# Patient Record
Sex: Male | Born: 1955 | Race: White | Hispanic: No | Marital: Married | Smoking: Current every day smoker
Health system: Southern US, Community
[De-identification: ages and names within clinical notes are randomized; demographics above are authoritative.]

## PROBLEM LIST (undated history)

## (undated) DIAGNOSIS — Z720 Tobacco use: Secondary | ICD-10-CM

## (undated) DIAGNOSIS — I10 Essential (primary) hypertension: Secondary | ICD-10-CM

## (undated) DIAGNOSIS — D119 Benign neoplasm of major salivary gland, unspecified: Secondary | ICD-10-CM

## (undated) DIAGNOSIS — K649 Unspecified hemorrhoids: Secondary | ICD-10-CM

## (undated) DIAGNOSIS — G4730 Sleep apnea, unspecified: Secondary | ICD-10-CM

## (undated) DIAGNOSIS — K219 Gastro-esophageal reflux disease without esophagitis: Secondary | ICD-10-CM

## (undated) DIAGNOSIS — E669 Obesity, unspecified: Secondary | ICD-10-CM

## (undated) DIAGNOSIS — K5730 Diverticulosis of large intestine without perforation or abscess without bleeding: Secondary | ICD-10-CM

## (undated) DIAGNOSIS — Z860100 Personal history of colon polyps, unspecified: Secondary | ICD-10-CM

## (undated) DIAGNOSIS — I2510 Atherosclerotic heart disease of native coronary artery without angina pectoris: Secondary | ICD-10-CM

## (undated) DIAGNOSIS — N183 Chronic kidney disease, stage 3 unspecified: Secondary | ICD-10-CM

## (undated) DIAGNOSIS — R1310 Dysphagia, unspecified: Secondary | ICD-10-CM

## (undated) DIAGNOSIS — I9789 Other postprocedural complications and disorders of the circulatory system, not elsewhere classified: Secondary | ICD-10-CM

## (undated) DIAGNOSIS — K5790 Diverticulosis of intestine, part unspecified, without perforation or abscess without bleeding: Secondary | ICD-10-CM

## (undated) DIAGNOSIS — I4891 Unspecified atrial fibrillation: Secondary | ICD-10-CM

## (undated) DIAGNOSIS — E785 Hyperlipidemia, unspecified: Secondary | ICD-10-CM

## (undated) DIAGNOSIS — Z789 Other specified health status: Secondary | ICD-10-CM

## (undated) DIAGNOSIS — I6522 Occlusion and stenosis of left carotid artery: Secondary | ICD-10-CM

## (undated) DIAGNOSIS — E781 Pure hyperglyceridemia: Secondary | ICD-10-CM

## (undated) DIAGNOSIS — Z87442 Personal history of urinary calculi: Secondary | ICD-10-CM

## (undated) DIAGNOSIS — I6502 Occlusion and stenosis of left vertebral artery: Secondary | ICD-10-CM

## (undated) DIAGNOSIS — M4322 Fusion of spine, cervical region: Secondary | ICD-10-CM

## (undated) DIAGNOSIS — I739 Peripheral vascular disease, unspecified: Secondary | ICD-10-CM

## (undated) DIAGNOSIS — M1990 Unspecified osteoarthritis, unspecified site: Secondary | ICD-10-CM

## (undated) HISTORY — DX: Fusion of spine, cervical region: M43.22

## (undated) HISTORY — DX: Atherosclerotic heart disease of native coronary artery without angina pectoris: I25.10

## (undated) HISTORY — PX: OTHER SURGICAL HISTORY: SHX169

## (undated) HISTORY — PX: KNEE ARTHROSCOPY: SUR90

## (undated) HISTORY — DX: Essential (primary) hypertension: I10

## (undated) HISTORY — DX: Personal history of colonic polyps: Z86.010

## (undated) HISTORY — PX: COLONOSCOPY: SHX174

## (undated) HISTORY — DX: Dysphagia, unspecified: R13.10

## (undated) HISTORY — DX: Other postprocedural complications and disorders of the circulatory system, not elsewhere classified: I97.89

## (undated) HISTORY — DX: Occlusion and stenosis of left carotid artery: I65.22

## (undated) HISTORY — DX: Occlusion and stenosis of left vertebral artery: I65.02

## (undated) HISTORY — PX: ACHILLES TENDON REPAIR: SUR1153

## (undated) HISTORY — DX: Pure hyperglyceridemia: E78.1

## (undated) HISTORY — PX: CATARACT EXTRACTION: SUR2

## (undated) HISTORY — PX: BACK SURGERY: SHX140

## (undated) HISTORY — DX: Sleep apnea, unspecified: G47.30

## (undated) HISTORY — DX: Gastro-esophageal reflux disease without esophagitis: K21.9

## (undated) HISTORY — DX: Unspecified atrial fibrillation: I48.91

## (undated) HISTORY — PX: CARDIAC CATHETERIZATION: SHX172

## (undated) HISTORY — DX: Obesity, unspecified: E66.9

## (undated) HISTORY — DX: Diverticulosis of large intestine without perforation or abscess without bleeding: K57.30

## (undated) HISTORY — DX: Hyperlipidemia, unspecified: E78.5

## (undated) HISTORY — DX: Other specified health status: Z78.9

## (undated) HISTORY — DX: Unspecified hemorrhoids: K64.9

## (undated) HISTORY — DX: Tobacco use: Z72.0

## (undated) HISTORY — DX: Personal history of colon polyps, unspecified: Z86.0100

---

## 1999-07-30 ENCOUNTER — Ambulatory Visit: Admission: RE | Admit: 1999-07-30 | Discharge: 1999-07-30 | Payer: Self-pay | Admitting: Family Medicine

## 2002-05-04 ENCOUNTER — Ambulatory Visit (HOSPITAL_BASED_OUTPATIENT_CLINIC_OR_DEPARTMENT_OTHER): Admission: RE | Admit: 2002-05-04 | Discharge: 2002-05-04 | Payer: Self-pay | Admitting: Orthopedic Surgery

## 2003-03-09 ENCOUNTER — Ambulatory Visit (HOSPITAL_BASED_OUTPATIENT_CLINIC_OR_DEPARTMENT_OTHER): Admission: RE | Admit: 2003-03-09 | Discharge: 2003-03-09 | Payer: Self-pay | Admitting: Pulmonary Disease

## 2004-05-15 ENCOUNTER — Inpatient Hospital Stay (HOSPITAL_COMMUNITY): Admission: EM | Admit: 2004-05-15 | Discharge: 2004-05-16 | Payer: Self-pay | Admitting: Emergency Medicine

## 2004-05-22 ENCOUNTER — Ambulatory Visit (HOSPITAL_COMMUNITY): Admission: RE | Admit: 2004-05-22 | Discharge: 2004-05-22 | Payer: Self-pay | Admitting: Cardiology

## 2004-05-23 ENCOUNTER — Observation Stay (HOSPITAL_COMMUNITY): Admission: EM | Admit: 2004-05-23 | Discharge: 2004-05-24 | Payer: Self-pay | Admitting: Emergency Medicine

## 2004-09-06 ENCOUNTER — Ambulatory Visit: Payer: Self-pay | Admitting: Cardiology

## 2004-09-10 ENCOUNTER — Ambulatory Visit: Payer: Self-pay | Admitting: Cardiovascular Disease

## 2004-10-30 ENCOUNTER — Ambulatory Visit: Payer: Self-pay | Admitting: Cardiovascular Disease

## 2004-11-15 ENCOUNTER — Ambulatory Visit: Payer: Self-pay | Admitting: Cardiovascular Disease

## 2004-11-19 ENCOUNTER — Ambulatory Visit: Payer: Self-pay | Admitting: Cardiology

## 2004-11-19 ENCOUNTER — Inpatient Hospital Stay (HOSPITAL_BASED_OUTPATIENT_CLINIC_OR_DEPARTMENT_OTHER): Admission: RE | Admit: 2004-11-19 | Discharge: 2004-11-19 | Payer: Self-pay | Admitting: Cardiovascular Disease

## 2004-11-25 ENCOUNTER — Ambulatory Visit: Payer: Self-pay | Admitting: Family Medicine

## 2004-11-26 ENCOUNTER — Ambulatory Visit: Payer: Self-pay | Admitting: Cardiology

## 2004-11-27 ENCOUNTER — Ambulatory Visit: Payer: Self-pay | Admitting: Internal Medicine

## 2004-12-25 ENCOUNTER — Ambulatory Visit: Payer: Self-pay | Admitting: Internal Medicine

## 2005-01-14 ENCOUNTER — Ambulatory Visit: Payer: Self-pay | Admitting: Internal Medicine

## 2005-06-26 ENCOUNTER — Ambulatory Visit: Payer: Self-pay | Admitting: Internal Medicine

## 2006-01-12 ENCOUNTER — Ambulatory Visit: Payer: Self-pay | Admitting: Internal Medicine

## 2006-02-23 ENCOUNTER — Ambulatory Visit: Payer: Self-pay | Admitting: Internal Medicine

## 2006-07-01 ENCOUNTER — Ambulatory Visit: Payer: Self-pay | Admitting: Family Medicine

## 2006-07-23 ENCOUNTER — Ambulatory Visit: Payer: Self-pay | Admitting: Internal Medicine

## 2006-08-03 ENCOUNTER — Encounter (INDEPENDENT_AMBULATORY_CARE_PROVIDER_SITE_OTHER): Payer: Self-pay | Admitting: *Deleted

## 2006-08-03 ENCOUNTER — Ambulatory Visit: Payer: Self-pay | Admitting: Internal Medicine

## 2006-10-12 ENCOUNTER — Observation Stay (HOSPITAL_COMMUNITY): Admission: EM | Admit: 2006-10-12 | Discharge: 2006-10-13 | Payer: Self-pay | Admitting: Emergency Medicine

## 2006-10-12 ENCOUNTER — Ambulatory Visit: Payer: Self-pay | Admitting: Cardiology

## 2006-11-11 ENCOUNTER — Ambulatory Visit: Payer: Self-pay | Admitting: Cardiovascular Disease

## 2007-07-23 ENCOUNTER — Ambulatory Visit: Payer: Self-pay | Admitting: Cardiology

## 2007-09-14 ENCOUNTER — Ambulatory Visit: Payer: Self-pay | Admitting: Cardiovascular Disease

## 2008-02-28 ENCOUNTER — Ambulatory Visit: Payer: Self-pay | Admitting: Cardiovascular Disease

## 2008-03-01 DIAGNOSIS — K5730 Diverticulosis of large intestine without perforation or abscess without bleeding: Secondary | ICD-10-CM | POA: Insufficient documentation

## 2008-03-01 DIAGNOSIS — K219 Gastro-esophageal reflux disease without esophagitis: Secondary | ICD-10-CM

## 2008-03-01 DIAGNOSIS — K649 Unspecified hemorrhoids: Secondary | ICD-10-CM | POA: Insufficient documentation

## 2008-03-01 DIAGNOSIS — Z860100 Personal history of colon polyps, unspecified: Secondary | ICD-10-CM | POA: Insufficient documentation

## 2008-03-01 DIAGNOSIS — G4733 Obstructive sleep apnea (adult) (pediatric): Secondary | ICD-10-CM

## 2008-03-01 DIAGNOSIS — I2510 Atherosclerotic heart disease of native coronary artery without angina pectoris: Secondary | ICD-10-CM

## 2008-03-06 ENCOUNTER — Ambulatory Visit: Payer: Self-pay

## 2008-05-16 ENCOUNTER — Emergency Department (HOSPITAL_COMMUNITY): Admission: EM | Admit: 2008-05-16 | Discharge: 2008-05-16 | Payer: Self-pay | Admitting: Emergency Medicine

## 2008-05-16 ENCOUNTER — Ambulatory Visit: Payer: Self-pay | Admitting: Internal Medicine

## 2008-06-08 ENCOUNTER — Ambulatory Visit: Payer: Self-pay | Admitting: Cardiovascular Disease

## 2008-06-19 ENCOUNTER — Encounter: Payer: Self-pay | Admitting: Internal Medicine

## 2008-07-10 ENCOUNTER — Encounter: Payer: Self-pay | Admitting: Internal Medicine

## 2008-07-10 ENCOUNTER — Ambulatory Visit (HOSPITAL_COMMUNITY): Admission: RE | Admit: 2008-07-10 | Discharge: 2008-07-10 | Payer: Self-pay | Admitting: Gastroenterology

## 2008-10-12 ENCOUNTER — Ambulatory Visit: Payer: Self-pay | Admitting: Cardiovascular Disease

## 2009-06-19 DIAGNOSIS — J42 Unspecified chronic bronchitis: Secondary | ICD-10-CM

## 2009-06-19 DIAGNOSIS — I10 Essential (primary) hypertension: Secondary | ICD-10-CM

## 2009-06-19 DIAGNOSIS — R079 Chest pain, unspecified: Secondary | ICD-10-CM

## 2009-06-19 DIAGNOSIS — E669 Obesity, unspecified: Secondary | ICD-10-CM

## 2009-06-19 DIAGNOSIS — E785 Hyperlipidemia, unspecified: Secondary | ICD-10-CM | POA: Insufficient documentation

## 2009-06-19 DIAGNOSIS — R1310 Dysphagia, unspecified: Secondary | ICD-10-CM | POA: Insufficient documentation

## 2009-06-19 DIAGNOSIS — E781 Pure hyperglyceridemia: Secondary | ICD-10-CM | POA: Insufficient documentation

## 2009-06-21 ENCOUNTER — Ambulatory Visit: Payer: Self-pay | Admitting: Cardiovascular Disease

## 2009-07-10 ENCOUNTER — Ambulatory Visit: Payer: Self-pay | Admitting: Cardiovascular Disease

## 2009-07-12 ENCOUNTER — Encounter (INDEPENDENT_AMBULATORY_CARE_PROVIDER_SITE_OTHER): Payer: Self-pay | Admitting: *Deleted

## 2009-07-12 LAB — CONVERTED CEMR LAB
ALT: 30 units/L (ref 0–53)
AST: 26 units/L (ref 0–37)
Alkaline Phosphatase: 44 units/L (ref 39–117)
Direct LDL: 77.2 mg/dL
Total Bilirubin: 0.8 mg/dL (ref 0.3–1.2)
Total CHOL/HDL Ratio: 5
Triglycerides: 325 mg/dL — ABNORMAL HIGH (ref 0.0–149.0)

## 2009-08-21 ENCOUNTER — Telehealth: Payer: Self-pay | Admitting: Cardiovascular Disease

## 2009-11-01 ENCOUNTER — Encounter (INDEPENDENT_AMBULATORY_CARE_PROVIDER_SITE_OTHER): Payer: Self-pay | Admitting: *Deleted

## 2010-01-11 ENCOUNTER — Telehealth (INDEPENDENT_AMBULATORY_CARE_PROVIDER_SITE_OTHER): Payer: Self-pay | Admitting: *Deleted

## 2010-01-15 ENCOUNTER — Encounter (INDEPENDENT_AMBULATORY_CARE_PROVIDER_SITE_OTHER): Payer: Self-pay | Admitting: *Deleted

## 2010-08-13 ENCOUNTER — Encounter: Payer: Self-pay | Admitting: Cardiovascular Disease

## 2010-08-13 ENCOUNTER — Emergency Department (HOSPITAL_COMMUNITY): Admission: EM | Admit: 2010-08-13 | Discharge: 2010-08-13 | Payer: Self-pay | Admitting: Emergency Medicine

## 2010-08-13 ENCOUNTER — Telehealth: Payer: Self-pay | Admitting: Cardiovascular Disease

## 2010-08-13 ENCOUNTER — Ambulatory Visit: Payer: Self-pay | Admitting: Internal Medicine

## 2010-08-14 ENCOUNTER — Telehealth: Payer: Self-pay | Admitting: Cardiovascular Disease

## 2010-09-13 ENCOUNTER — Telehealth: Payer: Self-pay | Admitting: Cardiovascular Disease

## 2010-11-28 NOTE — Letter (Signed)
Summary: Appointment - Reminder 2  [employer], Main Office  [address]   [place], [place] [address]   Phone: [phone]  Fax: [phone]     November 01, 2009 MRN: [id number]   [name] [address] [place], [place]  [address]   Dear Mr. Galant,  Our records indicate that it is time to schedule a follow-up appointment with Dr. Eden Emms. It is very important that we reach you to schedule this appointment. We look forward to participating in your health care needs. Please contact us at the number listed above at your earliest convenience to schedule your appointment.  If you are unable to make an appointment at this time, give [place] a call so we can update our records.  Sincerely,   Migdalia Dk [facility] Scheduling Team

## 2010-11-28 NOTE — Letter (Signed)
Summary: ER Notification  [employer], Main Office  [address]   [place], [place] [address]   Phone: [phone]  Fax: [phone]    August 13, 2010 11:24 AM  [name]  The above referenced patient has been advised to report directly to the Emergency Room. Please see below for more information:  Dx: Jonni Sanger pain___________     Private Vehicle  _____X__________ or EMS:  ________________   Orders:  Yes ______ or No  ___X____   Notify upon arrival:     Trish [phone]       Or _________________   Thank you,    [facility] Staff

## 2010-11-28 NOTE — Progress Notes (Signed)
Summary: pt having shoulder pain/arm numbness   Phone Note Call from Patient   Caller: Patient [phone] Reason for Call: Talk to Nurse Summary of Call: pt having left shoulder pain starting yesterday, left arm feeling numb, denies chest pain, sob, dizziness-pls call Initial call taken by: Glynda Jaeger,  August 13, 2010 11:04 AM  Follow-up for Phone Call        Pt reports pain started yesterday in left shoulder and went around to back. Took advil with little relief. Tossed and turned all night. Pain is continuing this morning with numbness to left hand. Took 2 NTG about 30 minutes ago with slight relief. I instructed pt he needed to call 911 and have EMS transport him to [facility]. He states he is in the car at present time and someone is driving him to the hospital and that he is about 10 minutes from hospital.  Will notify ED. Follow-up by: Dossie Arbour, RN, BSN,  August 13, 2010 11:23 AM

## 2010-11-28 NOTE — Letter (Signed)
Summary: Clearance Letter  [employer], Main Office  [address]   [place], [place] [address]   Phone: [phone]  Fax: [phone]    January 15, 2010  Re:     [name] Address:   [address]     [place], [place]  [address] DOB:     Sep 07, 1956 MRN:     [id number]   Dear Dr Thurston Hole,       [name] is low risk cardiac wise for procedure. Okay to stop plavix for procedure but would like patient to continue on the aspirin. It will be fine for the procedure to be done as an outpatient and please give [place] a call if any problems arise. Please call with any questions or concerns.   Sincerely,  Deliah Goody, RN/Dr Charlton Haws

## 2010-11-28 NOTE — Progress Notes (Signed)
Summary: need assistance with cost of plavix   Phone Note Call from Patient Call back at Home Phone [phone] Call back at [phone] Message from:  Patient on August 14, 2010 10:42 AM  Refills Requested: Medication #1:  PLAVIX 75 MG TABS Take 1 tablet by mouth once a day Caller: Spouse Reason for Call: Talk to Nurse Summary of Call: pt wife need assistance with cost of plavix. pls advise.  Initial call taken by: Lorne Skeens,  August 14, 2010 10:43 AM  Follow-up for Phone Call        spoke with pt's wife. Mrs. Hashem states he/she nees assistance with the Plavix. Pt is taken Plavix 75 mg, one tablet daily. I went over the  [employer] Patient assistance Program form with wife. Form faxed to pt's wife to fax # [phone] as requested.  Follow-up by: Ollen Gross, RN, BSN,  August 14, 2010 10:58 AM

## 2010-11-28 NOTE — Progress Notes (Signed)
   Recieved request from [facility] ONE forwarded to [facility] for processing [facility]  January 11, 2010 11:25 AM    Appended Document:  Recieved Request from [facility] forwarded to [facility] for processing

## 2010-11-28 NOTE — Progress Notes (Signed)
Summary: pt requesting samples of plavix   Phone Note Call from Patient   Caller: Patient [phone] Reason for Call: Talk to Nurse Summary of Call: pt requesting samples of plavix Initial call taken by: Glynda Jaeger,  September 13, 2010 12:00 PM  Follow-up for Phone Call        we have no samples of plavix as soon as we get some i will let the pt know.Marland KitchenMarland KitchenI called pt and let im know what the situation was Follow-up by: Kem Parkinson,  September 13, 2010 3:25 PM

## 2011-01-08 LAB — POCT CARDIAC MARKERS
CKMB, poc: <1 ng/mL — ABNORMAL LOW (ref 1.0–8.0)
Troponin i, poc: <0.05 ng/mL (ref 0.00–0.09)

## 2011-01-08 LAB — DIFFERENTIAL
Basophils Absolute: 0.1 10*3/uL (ref 0.0–0.1)
Basophils Relative: 1 % (ref 0–1)
Lymphocytes Relative: 13 % (ref 12–46)
Monocytes Relative: 7 % (ref 3–12)
Neutro Abs: 7.8 10*3/uL — ABNORMAL HIGH (ref 1.7–7.7)
Neutrophils Relative %: 75 % (ref 43–77)

## 2011-01-08 LAB — POCT I-STAT, CHEM 8
Chloride: 108 mEq/L (ref 96–112)
HCT: 46 % (ref 39.0–52.0)
Potassium: 4.4 mEq/L (ref 3.5–5.1)

## 2011-01-08 LAB — URINALYSIS, ROUTINE W REFLEX MICROSCOPIC
Bilirubin Urine: NEGATIVE
Ketones, ur: NEGATIVE mg/dL
Nitrite: NEGATIVE
Protein, ur: NEGATIVE mg/dL
Urobilinogen, UA: 0.2 mg/dL (ref 0.0–1.0)

## 2011-01-08 LAB — CBC
Hemoglobin: 15 g/dL (ref 13.0–17.0)
MCHC: 34.2 g/dL (ref 30.0–36.0)
WBC: 10.3 10*3/uL (ref 4.0–10.5)

## 2011-03-11 NOTE — Discharge Summary (Signed)
[name], [name]               ACCOUNT NO.:  [id number]   MEDICAL RECORD NO.:  [id number]          PATIENT TYPE:  INP   LOCATION:  1827                         FACILITY:  [facility]   PHYSICIAN:  [name], MDDATE OF BIRTH:  [birth year]   DATE OF ADMISSION:  05/16/2008  DATE OF DISCHARGE:  05/16/2008                               DISCHARGE SUMMARY   PRIMARY CARDIOLOGIST:  Kyle Arista C. Eden Emms, MD, [facility].   PRIMARY CARE PHYSICIAN:  Kyle Schwalbe, MD.   PROCEDURES PERFORMED DURING HOSPITALIZATION.:  Cardiac catheterization  completed by Dr. Arvilla [name] on May 16, 2008, revealing  nonobstructive disease in all coronary arteries with patent stents in  the right coronary artery in OM1 with a normal LVEF at 65% and normal LV  wall motion.   FINAL DISCHARGE DIAGNOSES:  1. Chest pain in the setting of coronary artery disease.  2. Status post cardiac catheterization with patent stents.  3. Status post drug-eluting stent to the right coronary artery in 2005      and drug-eluting stent to the circumflex in 2007.  4. Hypertension.  5. Hyperlipidemia.  6. Hypertriglyceridemia.  7. History of obstructive sleep apnea.  8. Ongoing tobacco abuse.   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  The  patient's wife states that he has been more short of breath over the  last several weeks.  The pain was very similar to the pain prior to his  stent placement in 2007.  The patient took one nitroglycerin, called  [place], and was advised to come to the emergency room.  The patient was  seen and examined in the emergency room by myself and Dr. Arvilla [name] and placed on nitroglycerin, heparin, and oxygen and planned  for cardiac catheterization the same day.  The patient was pain free  after institution of nitroglycerin.  The  patient was willing to proceed  with catheterization.  Catheterization was completed by Dr. Gala [name],  with results described above.  Please see Dr. Prescott [name]  catheterization report for more details.  It was found that the  patient's stents were patent, and he was stable for discharge home.  After the usual recovery time, the patient was discharged home on  medications prior to his admission.  The patient is to follow up with  Dr. Charlton [name] in the next couple of weeks; our office will call, as  it is after hours, to make a followup appointment.   DISCHARGE LABORATORIES:  D-dimer 0.34, troponin less than 0.1, CK 215,  and MB 3.1.  Sodium 140, potassium 3.9, chloride 107, CO2 of 26, glucose  126, BUN 12, and creatinine 1.01.  Hemoglobin 13.6, hematocrit 39.9,  white blood cells 6.7, and platelets 172.  Chest x-ray dated May 16, 2008, revealing no active disease.  EKG revealing normal sinus rhythm.   VITAL SIGNS ON DISCHARGE:  Blood pressure 146/80, heart rate 68,  respirations 16, and O2  sat 98% on room air.   DISCHARGE MEDICATIONS:  1. Plavix 75 mg daily.  2. Prilosec 20 mg daily.  3. Simvastatin 80 mg daily.  4. Coated aspirin 325 mg daily.  5. Lisinopril 10 mg daily.  6. Fish oil daily.   ALLERGIES:  No known drug allergies.   FOLLOWUP PLANS AND APPOINTMENTS:  1. The patient is to follow up with Dr. Charlton [name], our office will      call, as this is after hours, to make a followup appointment.  2. The patient is to follow up with his primary care physician Dr.      Alphonsus [name] for continued medical management.  3. The patient has been advised on smoking cessation.  4. The patient has been given post cardiac-catheterization      instructions, with particular emphasis on the right groin site for      evidence of bleeding, hematoma, or signs of infection.   Time spent with the patient to include physician time of 35 minutes.      Bettey Mare. Lyman Bishop, NP      Kyle Buckles. Bensimhon, MD  Electronically Signed    KML/MEDQ  D:  05/16/2008  T:  05/17/2008  Job:  [id number]   cc:   Kyle Schwalbe, MD

## 2011-03-11 NOTE — Assessment & Plan Note (Signed)
[facility]                            CARDIOLOGY OFFICE NOTE   NAME:[name], [name]                      MRN:          [id number]  DATE:10/12/2008                            DOB:          08/01/1956    [name] returns today for followup.  He has known coronary artery disease.  He has had previous drug-eluting stent to the right coronary artery in  2005 and drug-eluting stent to the circ in 2007.  He has hypertension  and hyperlipidemia with ongoing smoking cessation.  I talked to him at  length about this.  He is just not willing to quit.  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  He is trying to find a way around this by getting a  tobacco contract with [employer].  Orvilla Fus is not having any  significant chest pain.  He had a chest x-ray in July before his heart  cath, which did not show any lung cancer or abnormalities.   He has mild chronic exertional dyspnea from his subclinical COPD.   He has been compliant with his medications.  He will probably need a  refill soon.  Otherwise, he is doing well.  He has no known allergies.   MEDICATIONS:  1. He is on an aspirin a day.  2. Plavix 75 a day.  3. Prilosec 75 a day.  4. Simvastatin 40.  5. Lisinopril 10 a day.   He uses [employer] at [employer].  He has tried Chantix in the  past, but is not quit smoking successfully.   PHYSICAL EXAMINATION:  GENERAL:  Remarkable for a irascible white male  in no distress.  He is a bit bronchitic.  VITAL SIGNS:  His weight is 216, blood pressure 120/70, pulse 82 and  regular, respiratory rate 14, afebrile.  HEENT:  Unremarkable.  NECK:  Carotids are normal without bruit.  No lymphadenopathy,  thyromegaly, or JVP elevation.  LUNGS:  Clear.  Good diaphragmatic motion.  No wheezing.  CARDIAC:  S1, S2 with normal  heart sounds.  PMI normal.  ABDOMEN:  Benign.  Bowel sounds positive.  No AAA.  No tenderness.  No  bruit.  No hepatosplenomegaly or hepatojugular reflux.  No tenderness.  EXTREMITIES:  Distal pulses are intact.  No edema.  NEUROLOGIC:  Nonfocal.  SKIN:  Warm and dry.  MUSCULOSKELETAL:  No muscular weakness.   IMPRESSION:  1. Coronary artery disease stent to the right coronary artery and circ      widely patent by cath this past July.  Continue aspirin and Plavix.  2. Chronic obstructive pulmonary disease subclinically p.r.n.      inhalers.  I talked to him at length about his smoking, but I do      not think this is ever going to work for [employer].  We will make sure      he gets a followup chest x-ray in July since we  seem to be the only      people who follows this.  3. Hypercholesteremia.  Continue simvastatin.  Lipid and liver profile      in 6 months.  4. Hypertension, currently well controlled.  Continue lisinopril and      low-sodium diet.  5. History of reflux.  Undoubtedly worsened by the patient's alcohol      intake and nicotine.  Continue Prilosec.  No indication for EGD at      this time.     [name]. Eden Emms, MD, [facility]  Electronically Signed    PCN/MedQ  DD: 10/12/2008  DT: 10/13/2008  Job #: [id number]

## 2011-03-11 NOTE — Assessment & Plan Note (Signed)
[facility]                            CARDIOLOGY OFFICE NOTE   NAME:[name], [name]                      MRN:          [id number]  DATE:02/28/2008                            DOB:          [birth year]    [name] returns today for follow-up. He has been doing well.  He has been  active.  He is not having significant chest pain.  He has exertional  dyspnea which is likely related to his smoking. There has been no cough  or sputum production.  No active wheezing.   The patient has been compliant with his meds.  He needs to be on blood  pressure pills.   He has a history coronary disease with previous stent to the circ and  right coronary artery.  His last cath in 2007 resulted in stenting of  the circ with a patent stent to the right.   His review of systems remarkable for a question of a ventral hernia,  otherwise negative.   PHYSICAL EXAMINATION:  VITAL SIGNS:  Blood pressure of 151/90, pulse 79  and regular, weight 220, respiratory rate 14.  HEENT:  Unremarkable.  Carotids normal without bruit, no  lymphadenopathy, thyromegaly or JVP elevation.  LUNGS:  Clear with good diaphragmatic motion.  No wheezing. S1, S2 with  normal heart sounds. PMI normal.  ABDOMEN:  Benign.  Bowel sounds positive. No AAA, no tenderness.  He  does have a bit of a separation in his rectus muscle below the xiphoid.  No large ventral hernia.  No hepatosplenomegaly or hepatojugular reflux.  Distal pulses are intact with no edema.  NEURO:  Nonfocal.  SKIN:  Warm and dry.  No muscular weakness.   EKG shows sinus rhythm with no acute changes.   IMPRESSION:  1. Dyspnea related to smoking. I talked to him at length regarding      quitting. Not interested in Wellbutrin or Chantix.  2. History of stents to the right coronary artery and circumflex.      Follow-up stress Myoview, particularly given continued smoking.  3. Hypertension.  Add lisinopril 10 mg a day. See what his  blood      pressure response is to exercise. Encouraged low-salt diet.  4. Hypercholesterolemia.  Continue simvastatin 40 a day, lipid and      liver profile in 6 months.  5. History of reflux. Cutdown alcohol intake.  Continue Prilosec.      Follow-up with primary care MD.     [name]. Eden Emms, MD, [facility]  Electronically Signed   PCN/MedQ  DD: 02/28/2008  DT: 02/28/2008  Job #: [id number]

## 2011-03-11 NOTE — Op Note (Signed)
NAMEJATAVIOUS, [name]               ACCOUNT NO.:  [id number]   MEDICAL RECORD NO.:  [id number]          PATIENT TYPE:  AMB   LOCATION:  ENDO                         [facility]ACILITY:  [facility]   PHYSICIAN:  Petra Kuba, M.D.    DATE OF BIRTH:  Mar 17, 1956   DATE OF PROCEDURE:  07/10/2008  DATE OF DISCHARGE:                               OPERATIVE REPORT   PROCEDURE:  Esophagogastroduodenoscopy with dilatation.   INDICATION:  Patient with longstanding upper tracheal symptoms, some  dysphagia.  Consent was signed after risks, benefits, methods, options  thoroughly discussed multiple times in the past.   MEDICINES USED:  Fentanyl 100 mcg.  Versed 8 mg.   PROCEDURE IN DETAIL:  The video endoscope was inserted by direct vision.  Proximal and mid esophagus was normal and the distal esophagus was a  small hiatal hernia, but no signs of ring strictures or significant  esophagitis.  The scope was passed in the stomach and advanced through a  normal antrum, normal pylorus, into a normal duodenal bulb and around  the cecum to a normal second portion of the duodenum.  The scope was  withdrawn back to the bulb, and a good look there ruled out  abnormalities at that location.  The scope was withdrawn back to the  stomach and retroflexed high in the cardia.  A hiatal hernia was  confirmed.  The fundus angularis was visualized throughout retro curve,  revealed normal  retroflexed visualization.  Visualization and stomach  did not reveal any additional findings.  The scope was then slowly  withdrawn back to 20 cm.  Again the esophagus was normal, except for the  small hiatal hernia.  We then readvanced to the antrum and under fluoro  guidance, the Savary wire was advanced.  The customary J-loop was  confirmed in the antrum.  The scope was removed, making sure to keep the  wire in the proper location under fluoro.  Once the scope was removed,  the Savary 16 mm dilator only was advanced into the stomach and  confirmed in the proper position under fluoro.  There was minimal  proximal resistance, but then once past his throat, there was no  resistance.  Once the dilator was confirmed in the stomach, the wire was  withdrawn back into the dilator.  Both were removed in tandem.  There  was no heme on the dilator.   The patient tolerated the procedure well.  There was no obvious  immediate complication.   ENDOSCOPIC DIAGNOSES:  1. Small hiatal hernia.  2. Otherwise normal esophagogastroduodenoscopy.  3. Therapy Savary dilatation under fluoro to 16 mm only with minimal      proximal resistance and no heme.  4. Continue the Nexium.  See how the dilation works.  5. Follow up p.r.n. or in 6 weeks.  6. Fully advance diet.  7. Hold aspirin for 1 day.           ______________________________  Petra Kuba, M.D.     MEM/MEDQ  D:  07/10/2008  T:  07/10/2008  Job:  [id number]   cc:  [name]. Eden Emms, MD, [facility]  [address]  [address]  [place]  [place] [address]   Karie Schwalbe, MD  [address] [place], [place] [address]

## 2011-03-11 NOTE — Assessment & Plan Note (Signed)
[facility]                            CARDIOLOGY OFFICE NOTE   NAME:[name]                      MRN:          [id number]  DATE:06/08/2008                            DOB:          1956-03-13    [name] returns today for followup.   He has known coronary artery disease and has previous stents to the  right coronary artery and circ.  He unfortunately, continues to smoke.  I talked to him at length about this.  I offered him various of  substitutes including Nicorette gum, Chantix and takes and Wellbutrin.  Unfortunately, [name] is a lifelong smoker and he is not very motivated  to quit.  We talked about all the different risks in regards to lung  cancer and progression of his coronary disease.  He will let me know if  he wants to be proactive about this.   He has been fairly busy since hospital discharge and has not had any  recent chest pain.  He was seen on May 16, 2008, for chest pain which  was reminiscent of his angina.  He had a heart catheterization done by  Dr. Gala Romney.   The results were reviewed.  He had patent stents with moderate coronary  artery disease in the LAD and mid circ in the 40-50% range, noted to be  rate limiting.  These films were reviewed on the chemtronic system since  I was not involved in his initial care, time to review 10 minutes.   REVIEW OF SYSTEMS:  Remarkable for some dysphasia.  He clearly describes  some difficulty swallowing both solids and some food.  I tried to  question him to make sure this was not postnasal drip or other  secretions from an ear, nose and throat problem but he specifically  indicated he has problems swallowing things.   His review of systems otherwise remarkable for no significant chest  pain, no diaphoresis, no coughing, no hemoptysis.   ALLERGIES:  He has no known allergies.   MEDICATIONS:  1. Aspirin a day.  2. Plavix 75 a day.  3. Simvastatin 40 a day.  4.  Lisinopril 10 a day.   PHYSICAL EXAMINATION:  GENERAL:  Remarkable for a bronchitic male in no  distress.  He has returned from a recent trip to [place].  VITAL SIGNS:  His weight is 214, blood pressure 140/80, pulse 73 and  regular, respiratory rate 14, afebrile.  HEENT:  Unremarkable.  NECK:  Carotids are normal without bruit.  No lymphadenopathy,  thyromegaly, or JVP elevation.  LUNGS:  Clear.  Good diaphragmatic motion.  No wheezing.  HEART:  S1 and S2 with normal heart sounds.  PMI normal.  ABDOMEN:  Benign.  Bowel sounds positive.  No AAA.  No tenderness.  No  bruit.  No hepatosplenomegaly.  No hepatojugular reflux.  EXTREMITIES:  Distal pulses are  intact.  No edema.  NEURO:  Nonfocal.  SKIN:  Warm and dry.  MUSCULOSKELETAL:  No muscular weakness.   IMPRESSION:  1. Coronary artery disease.  Recent hospitalization for chest pain,  benign cath.  Continue aspirin, Plavix and beta-blocker.  2. Smoking sensation.  I counseled the patient for actually more than      10 minutes in regards to this.  I have been talking to [facility] for      many years.  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  Again, I gave him all of his options and he will      let me know if he gets motivated.  3. History of reflux.  Continue Prilosec.  4. New-onset dysphagia.  Refer to Dr. Ewing Schlein or Dr. Matthias Hughs, may need      upper endoscopy.  5. Hypercholesterolemia in the setting of coronary artery disease.      Continue simvastatin, lipid and liver profile in 6 months.  6. Hypertension, currently well-controlled.  Continue low-salt diet      and lisinopril.   I will see [name] back in 6 months.  He will let me know if he has any  further chest pain.  He does have nitroglycerin at home and we will have  to see what his dysphasia is all about.     [name]. Eden Emms, MD, [facility]  Electronically  Signed    PCN/MedQ  DD: 06/08/2008  DT: 06/08/2008  Job #: [id number]

## 2011-03-11 NOTE — H&P (Signed)
[name], [name]               ACCOUNT NO.:  [id number]   MEDICAL RECORD NO.:  [id number]          PATIENT TYPE:  INP   LOCATION:  1827                         FACILITY:  [facility]   PHYSICIAN:  Bevelyn Buckles. Bensimhon, MDDATE OF BIRTH:  Mar 12, 1956   DATE OF ADMISSION:  05/16/2008  DATE OF DISCHARGE:  05/16/2008                              HISTORY & PHYSICAL   PRIMARY CARDIOLOGIST:  Theron Arista C. 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  The  patient's wife also states that he had been more short of breath for the  last several weeks.  The patient admits to exertional activity over the  weekend to include cutting and preparing corn; however, he did not have  any chest pain at that time.  The patient had gone to work today, was  sitting, reading the newspaper, and had severe substernal chest pain  similar to the pain he had prior to stent placement.  The patient called  the [facility] to talk with Dr. Eden Emms.  He was advised to take a  sublingual nitroglycerin and come to the emergency room.  On arrival in  the emergency room, the patient continued to have some chest pain.  I  saw him and placed him on nitroglycerin drip, heparin, and oxygen.  The  patient's pain subsided without any further reoccurrence.  The patient  denies any nausea, vomiting, diaphoresis, weakness, or dizziness.  He  has noticed some shortness of breath.  Unfortunately, the patient  continues to smoke despite multiple discussions concerning cessation.   REVIEW OF SYSTEMS:  The patient complained of chest pain, shortness of  breath, and dyspnea on exertion.  Otherwise, negative for nausea,  vomiting, diarrhea, diaphoresis, or weakness.   PAST MEDICAL HISTORY:  1. CAD.      a.      Status post drug-eluting stent to the right coronary artery       in 2005 and drug-eluting stent to the circumflex in 2007.      b.     Catheterization in December 2007 by Dr. Excell Seltzer showed widely       patent right coronary artery stent with nonobstructive lesion in       the proximal right coronary artery, 40% mid LAD, 20% proximal       circumflex, 95-40% mid circumflex with an EF of 65%.  The patient       had a stent placed reducing it from 95% and 40% to 0% per Dr.       Excell Seltzer.  2. Obstructive sleep apnea.  3. Hypertension.  4. Hypercholesterolemia.  5. Hypertriglyceridemia.  6. Ongoing tobacco abuse.  7. GERD.  8. Obesity.   SOCIAL HISTORY:  The patient lives in [place] with his wife.  He is  a Psychologist, sport and exercise.  He is a 40-pack-a-year or more smoker.  Drinks daily.  No drug use.   FAMILY HISTORY:  Pancreatic cancer in his mother, who has deceased from  this.  Father deceased from motor vehicle accident.  Siblings, he has 2  brothers with MIs in their 63s.   CURRENT MEDICATIONS AT HOME:  1. Plavix 75 mg daily.  2. Aspirin 325 mg daily.  3. Prilosec 20 mg daily.  4. Fish oil daily.  5. Simvastatin 80 mg daily.  6. Lisinopril 10 mg daily.   ALLERGIES:  No known drug allergies.   Cardiac markers, point-of-care, CK-MB less than 1.0, troponin 0.05,  myoglobin 103.  Other labs are pending.   PHYSICAL EXAMINATION:  VITAL SIGNS:  Blood pressure 142/84, pulse 70,  respirations 18, temperature 98.5, and O2 sat 98% on 2 liters.  HEENT:  Head is normocephalic and atraumatic.  Eyes, PERRLA.  Mucous  membranes of the mouth, pink and moist.  Tongue is midline.  Neck is  supple.  There is no JVD.  No carotid bruits appreciated.  CARDIOVASCULAR:  Regular rate and rhythm without murmurs, rubs, or  gallops at present.  Pulses are equal bilaterally.  Femoral pulses are  1+ bilaterally.  LUNGS:  Essentially clear to auscultation.  ABDOMEN:  Soft and nontender, 2+ bowel sounds.   EXTREMITIES:  Without clubbing, cyanosis, or edema.  NEURO:  Cranial nerves II through XII are grossly intact.   Chest x-ray reveals normal sinus rhythm with ventricular rate at 72  beats per minute.   IMPRESSION:  1. Unstable angina.  2. Acute coronary syndrome.  3. History of coronary artery disease status post drug-eluting stent      to the right coronary artery in 2005 and circumflex in 2007,      otherwise nonobstructive coronary artery disease.  4. Hypertension.  5. Ongoing tobacco abuse.  6. Hyperlipidemia.  7. Obstructive sleep apnea.   PLAN:  The patient is seen and examined by myself and Dr. Arvilla Meres in the emergency room.  The patient has been placed on a  nitroglycerin drip and heparin drip and is currently pain free.  He will  be scheduled for cardiac  catheterization today in the setting of unstable angina with known  history of coronary artery disease.  The patient will continue on his  Plavix, aspirin, and lisinopril.  Smoking cessation counseling will  again be attempted and making further recommendations throughout  hospital course.      Bettey Mare. Lyman Bishop, NP      Bevelyn Buckles. Bensimhon, MD  Electronically Signed    KML/MEDQ  D:  05/16/2008  T:  05/17/2008  Job:  [id number]   cc:   Karie Schwalbe, MD

## 2011-03-11 NOTE — Assessment & Plan Note (Signed)
[name]                            CARDIOLOGY OFFICE NOTE   NAME:[name], [name]                      MRN:          [id number]  DATE:09/14/2007                            DOB:          04/12/1956    The patient returns today for follow-up.  He is a vasculopath.  He has  had stenting of the right coronary artery as well as drug-eluting stent  to the circumflex in December of 2007.   He has been doing fairly well.  He does not have any significant chest  pain.  Unfortunately, he continues to smoke.  I do not think he will  ever quit.  We have tried him on Chantix and this has failed.  I  counseled him for less than 10 minutes about this.  He understands the  connection between this and his coronary artery disease.  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  He is  under a lot of stress.   REVIEW OF SYSTEMS:  Otherwise negative.  He has not had any significant  coughing or sputum production.  He did not get a flu shot.  He needs a  chest x-ray.  He has not had one all year.   CURRENT MEDICATIONS:  1. Aspirin a day.  2. Plavix 75 mg a day.  3. Prilosec 75 mg a day.  4. Fish Oil.   He had a long convoluted story about cholesterol medicine that he got  from friends.  He needs a prescription called in for Zocor 40 mg.   PHYSICAL EXAMINATION:  GENERAL:  Remarkable for a middle-aged white male  in no distress.  Affect is appropriate.  VITAL SIGNS:  Weight is 246, blood pressure 140/80, pulse 70 and  regular, afebrile, respiratory rate 14.  HEENT:  Unremarkable.  NECK:  Supple.  No lymphadenopathy, thyromegaly, or JVP elevation.  No  bruits.  LUNGS:  Clear, good diaphragmatic motion, no wheezing.  ABDOMEN:  Benign.  There is no AAA, no tenderness, no bruits, no  hepatosplenomegaly or hepatojugular reflux.  EXTREMITIES:  Pulses are intact, no  edema.  NEUROLOGY:  Nonfocal.  SKIN:  Warm and dry.  No muscular weakness.   IMPRESSION:  1. Coronary artery disease with drug-eluting stents to the obtuse      marginal and right coronary artery.  Continue aspirin and Plavix.  2. Hypercholesterolemia not on statin drug due to the patient's      noncompliance. Zocor 40 mg called in to Dr. Tiajuana Amass.  Lipid and liver      profile in three months.  3. Reflux.  Continue Prilosec 75 mg over-the-counter.  Avoid spicy      [name] and late night meal.  4. Smoking.  The patient is not motivated to stop.  I told him I would      be happy to call in a prescription for Wellbutrin or send him to      [facility] Hua L. Dellia Cloud, PhD for more psychobehavioral counseling.  He      did not want to do this.   The patient will also need a prescription called in for nitroglycerin  since his are more than a year old.   I will see him back in six months so long as his chest x-ray looks good  in the office.     [name]. Eden Emms, MD, [facility]  Electronically Signed    PCN/MedQ  DD: 09/14/2007  DT: 09/15/2007  Job #: [id number]

## 2011-03-14 NOTE — Cardiovascular Report (Signed)
NAMENASHID, PELLUM               ACCOUNT NO.:  [id number]   MEDICAL RECORD NO.:  [id number]          PATIENT TYPE:  OBV   LOCATION:  3703                         FACILITY:  [facility]   PHYSICIAN:  Veverly Fells. Excell Seltzer, MD  DATE OF BIRTH:  [birth year]   DATE OF PROCEDURE:  10/12/2006  DATE OF DISCHARGE:                            CARDIAC CATHETERIZATION   PROCEDURES:  1. Left heart catheterization.  2. Selective coronary angiography.  3. Left ventricular angiography.  4. Percutaneous transluminal cardiac angioplasty and stenting of the      left circumflex.  5. Angio-Seal of the right femoral artery.   PROCEDURAL INDICATIONS:  Mr. Nestle is a very nice 55 year old gentleman  who presented with unstable angina.  He has a history of coronary artery  disease and is status post stenting of the right coronary artery.  He  has had exertional angina for the past several weeks but this morning  was awakened with similar pain.  He came to the emergency department for  evaluation.  His initial biomarkers were negative.  An EKG was  nondiagnostic.  However, in the setting of his known coronary disease  with very typical symptoms, he was referred for cardiac catheterization.   PROCEDURAL DETAILS:  Risks and indications of the procedure were  explained in detail to the patient.  Informed consent was obtained.  The  right groin was prepped, draped and anesthetized with 1% lidocaine.  Using modified the Seldinger technique, a 6-French sheath was placed in  the right femoral artery.  Multiple views of the left and right coronary  arteries were taken.  For the left coronary artery, a 6-French JL-4  catheter was used. For the right coronary artery, a 6-French JR-4  catheter was used.  Following selective coronary angiography, an angled  pigtail catheter was inserted into the left ventricle, and ventricular  pressures were recorded.  A 30-degree right anterior oblique left  ventriculogram was performed.   Following ventriculography, a pullback  across the aortic valve was done.   Following the diagnostic portion of the procedure, the films were  reviewed, and the patient was found to have very high-grade stenosis of  95% in the mid left circumflex.  This diseased segment had clearly  progressed since his prior angiogram in January 2006.  After review, the  patient was enrolled in the PERSEUS study.  I elected to perform  intervention on this vessel as this was his only area of high-grade  stenosis.  Angiomax was used for anticoagulation; 600 milligrams of  clopidogrel was given to the patient on the table prior to the  procedure.  Once a therapeutic ACT was achieved, a 6-French XB 3.5 mm  guiding catheter was inserted.  A Cougar coronary guidewire was inserted  into the distal vessel.  The lesion was predilated on two subsequent  inflations with a 2.5 x 9 mm maverick balloon up to 8 atmospheres.  Following predilatation, intracoronary nitroglycerin was given, and the  vessel was thought to be a 2.75 mm vessel.  A 2.75 x 12 mm TAXUS Express  stent was placed and deployed at  10 atmospheres pressure.  Following  stent deployment, angiographic images demonstrated significant stenosis  off the distal end of the stent.  Prior to stent deployment, this area  appeared nonobstructive; however, following stent deployment, it  appeared to be angiographically significant.  Therefore, I elected to  place a 2.75 x 8 mm TAXUS stent to overlap the distal portion of the  first stent.  This was deployed at 10 atmospheres.  Following stent  deployment, the TAXUS balloon was pulled back, and the overlap was  treated up to 16 atmospheres.  Angiographic views demonstrated an  excellent angiographic result with TIMI-3 flow.  There was 0% residual  stenosis.  I elected to post dilate both stents with a 2.75 x 12 mm  Quantum Maverick balloon up to 16 atmospheres on two subsequent  inflations.  At the conclusion  of the procedure, the patient was chest  pain free.  There was a very small side branch that was jailed.  The  patient was chest pain free, and there was TIMI-3 flow in the main  branch of the vessel.  An Angio-Seal was used to close the right femoral  artery.   FINDINGS:  Aortic pressure 127/70 with mean of 96. Left ventricular  pressure 120/6 with an end-diastolic pressure of 16.   Left mainstem is angiographically normal and bifurcates into the LAD and  left circumflex.   The LAD is a medium-caliber vessel that courses down to the left  ventricular apex.  There is an area in the mid portion of the vessel  that has 40% stenosis.  This is in the region of the first and second  diagonal branches.  This appears angiographically nonobstructive.  There  are two diagonal branches, the first of which is small, and the second  is medium caliber.  There is no significant angiographic disease in  those vessels.  The remaining portions of the mid and distal LAD are  free of significant angiographic disease.   The left circumflex system is a medium-caliber vessel.  The proximal  portion of the left circumflex has 20% stenosis.  The mid portion of the  left circumflex has a focal 95% stenosis.  Following that area, there is  no significant angiographic disease throughout the remaining portion of  the first obtuse marginal branch.  The first obtuse marginal branch is a  large-caliber vessel that has multiple branches.  The true circumflex  beyond the marginal branch is very small diameter (less than 1 mm).   Right coronary artery has plaque in its proximal portion that is  nonobstructive.  The mid right coronary artery has a stent that is  widely patent.  There was no angiographic restenosis throughout the  stented segment.  The distal right coronary artery is free of any  significant angiographic disease.  It bifurcates into the PDA and posterior AV segment.  The posterior AV segment gives off  three  posterolateral branches.   Left ventriculogram performed in a 30-degree RAO projection demonstrates  normal left ventricular function.  There is no significant mitral  regurgitation.   ASSESSMENT:  1. Double-vessel coronary artery disease with high-grade focal      stenosis of the left circumflex and a widely patent stent in the      right coronary artery.  2. Nonobstructive disease in the left anterior descending.  3. Normal left ventricular function.   PLAN:  As described above, percutaneous coronary intervention of the  left circumflex was performed, and two stents  were used to treat the  area of high-grade stenosis in the mid circumflex.  There was 95%  stenosis down to 0% following stenting.  There is TIMI-3 flow pre and  post.  Aspirin and Plavix should be used in conjunction for dual  antiplatelet therapy for 12 months.      Veverly Fells. Excell Seltzer, MD  Electronically Signed     MDC/MEDQ  D:  10/12/2006  T:  10/12/2006  Job:  [id number]   cc:   Karie Schwalbe, MD  [name]. Eden Emms, MD, [facility]

## 2011-03-14 NOTE — Assessment & Plan Note (Signed)
[facility]                            CARDIOLOGY OFFICE NOTE   NAME:[name], [name]                      MRN:          [id number]  DATE:11/11/2006                            DOB:          [birth year]    [name] returns today for followup. He has had previous drug-eluting stent  to the right coronary artery in July of 2005 for this was in a setting  of an IMI. His ejection fraction was normal. He was in the hospital from  December 17 to December 18 with recurrent angina and ended up having  another drug-eluting stent to the circ. He was discharged without  complications. Since discharge he has been doing well. 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 Unfortunately he continues to smoke. I talked  to him about decreasing this since it clearly is helping his coronary  disease progress. We will give him a prescription for Chantix.   His risk factors are otherwise well modified.   REVIEW OF SYSTEMS:  Remarkable for no PND, orthopnea, no shortness of  breath, and no wheezing. He has been compliant with his medications. I  talked to him at length about the importance of Plavix, particularly now  that he has 2 drug-eluting stents in.   PHYSICAL EXAMINATION:  Blood pressure is 130/80, pulse is 80 and  regular.  HEENT: Normal. There are no carotid bruits.  LUNGS: Clear without wheezing.  There is an S1, S2, with normal heart sounds.  ABDOMEN: Benign.  LOWER EXTREMITIES: Pulse intact, no edema.   He is on;  1. Aspirin a day.  2. Plavix 75 a day.  3. Prilosec 20 a day.  4. Fish oil.  5. He will also start taking Chantix.   His EKG today was totally normal.   IMPRESSION:  Stable previous inferior myocardial infarction with  stenting of the right coronary artery, patent by recent catheterization,  new drug-eluting stent to the mid circumflex on October 12, 2006.  Normal left  ventricular function. Continue risk factor modification. The  patient is on Lipitor 80 mg and will continue this. We will check his  liver function tests in 6 months. He will continue his aspirin and  Plavix. He has had some excessive fatigue on beta blockers in the past  and I will not rechallenge him at the time. His heart rate in the office  today was actually as low as 65.   Hopefully he can cut back on his smoking or quit with the aid of  Chantix. I will see him back in 6 months.     [name]. Eden Emms, MD, [facility]  Electronically Signed    PCN/MedQ  DD: 11/11/2006  DT: 11/11/2006  Job #: [id number]

## 2011-03-14 NOTE — H&P (Signed)
[name], [name]               ACCOUNT NO.:  [id number]   MEDICAL RECORD NO.:  [id number]          PATIENT TYPE:  OBV   LOCATION:  1823                         FACILITY:  [facility]   PHYSICIAN:  Kyle Frieze. Kyle Som, MD, FACCDATE OF BIRTH:  May 13, 1956   DATE OF ADMISSION:  10/12/2006  DATE OF DISCHARGE:                              HISTORY & PHYSICAL   PRIMARY CARE PHYSICIAN:  Dr. Alphonsus [name].   PRIMARY CARDIOLOGIST:  Dr. Eden [name].   BRIEF HISTORY:  Mr. [name] is a 55 year old white male who presented to  [facility] emergency room complaining of chest discomfort.   He states he was awakened from sleep at approximately 2 a.m. with  anterior chest tightness. This was unassociated with nausea, vomiting,  diaphoresis or radiation. He was not sure if he had some shortness of  breath or not. He stated that he laid there until approximately 4 a.m.  at which time he woke his wife up, and she drove him to the hospital for  further evaluation. He did not think to take any sublingual  nitroglycerin or any stomach medication. He gave the discomfort a 7-8 on  a scale of 0/10 and feels that the symptoms are similar to his  myocardial infarction. He denies any recent accidents or injuries.  However, he does describe in the last several weeks some exertional  chest tightness relieved with rest. This is not pleuritic. In the  emergency room per the patient he received aspirin and sublingual  nitroglycerin. He is not sure if either of those helped, but his  discomfort is now 5 on a scale of 0/10. He states the last time his  discomfort was a 0 was prior to him going to bed last night. He denies  any associated burping or belching. For dinner he states he had a shrimp  salad with chicken tenders.   ALLERGIES:  No known drug allergies.   MEDICATIONS:  Prior to admission include aspirin 325 daily, a  cholesterol medication that he is not sure of the name or dose,  nitroglycerin 0.4 p.r.n. He thinks that he  has this at home and Prilosec  20 mg daily.   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 This was complicated by  pseudoaneurysm. Last catheterization November 19, 2004 showed EF of 55%,  50% diagonal, 130% mid circumflex, and 20% proximal PDA.   PAST SURGICAL HISTORY:  Lumbar surgery right knee and right finger.   SOCIAL HISTORY:  He resides in [place] with his wife. He has three  children. 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 He continues  to smoke 1-2 packs per day and has been doing so for 40 years. He has  three Crown Royal/coke per day. He denies any drugs, herbal medications,  specific diet or exercise program.   FAMILY HISTORY:  Mother is deceased at the age of 3 with pancreatic  cancer. His father died at the age of 37 in a motor vehicle  accident. He  has two brothers that have had myocardial infarctions in their 17s.   REVIEW OF SYSTEMS:  In addition to above is notable for reading glasses,  chronic dyspnea on exertion. However, this has been worse lately with  decreased provocation, general arthralgias and rare dysphagia with food.  All other systems are unremarkable.   PHYSICAL EXAMINATION:  GENERAL:  Well-nourished, well-developed pleasant  white male accompanied by his wife. VITAL SIGNS:  Temperature is 97.8,  blood pressure initially was 155/83, now 123/71, pulse is 95 and  regular, respirations 20.  HEENT:  Unremarkable.  NECK:  Supple without thyromegaly, adenopathy, JVD or carotid bruits.  CHEST:  Symmetrical excursion. Diminished breath sounds in the bases  that overall are clear to auscultation without rales, rhonchi or  wheezing.  HEART:  PMI is not displaced. Regular rate and rhythm. I do not  appreciate murmurs, rubs, clicks or gallops. All pulses are symmetrical  and intact. I do not appreciate  abdominal or femoral bruits.  SKIN:  Integument is intact.  ABDOMEN:  Obese. Bowel sounds present without organomegaly, masses or  tenderness.  EXTREMITIES:  Negative cyanosis, clubbing or edema. There  is no change in discomfort with upper extremity full range of motion or  palpation.  MUSCULOSKELETAL:  Unremarkable.  NEUROLOGICAL:  Grossly unremarkable.   Chest x-ray:  No active disease. Initial EKG shows normal sinus rhythm,  first degree AV block with a PR interval 204 which is slightly longer  than July 2006 EKG early R wave. H&H is 14.7 and 41.9, normal indices,  platelets 171, WBC 8.9. Sodium 139, potassium 35, BUN 17, creatinine  1.2, glucose 105. Normal LFTs. PTT is 33. Point of care markers are  negative x1.   IMPRESSION:  1. Prolonged chest discomfort concerning for ischemia despite negative      EKG.  2. Hypertension.  3. Tobacco use history per past medical history.   DISPOSITION:  Dr. Jens [name] reviewed the patient's history, spoke with  and examined the patient. We will admit him to rule out myocardial  infarction. Given his recent exertional symptoms we will schedule him  for a cardiac catheterization today. The risks, benefits, and procedure  have been discussed with the patient, and he agrees to proceed. Tobacco  cessation. Will also add a beta blocker given his history of myocardial  infarction.      Kyle Rued, PA-C      Kyle Frieze Kyle Som, MD, [facility]  Electronically Signed    EW/MEDQ  D:  10/12/2006  T:  10/12/2006  Job:  [id number]   cc:   Kyle Schwalbe, MD  Kyle Frieze. Kyle Som, MD, [facility]  Kyle Share, MD,FCCP

## 2011-03-14 NOTE — H&P (Signed)
NAME:  [name], [name]                         ACCOUNT NO.:  [id number]   MEDICAL RECORD NO.:  [id number]                   PATIENT TYPE:  EMS   LOCATION:  MAJO                                 FACILITY:  [facility]   PHYSICIAN:  Charlton Haws, M.D.                  DATE OF BIRTH:  1956/05/30   DATE OF ADMISSION:  05/15/2004  DATE OF DISCHARGE:                                HISTORY & PHYSICAL   PRIMARY DIAGNOSIS:  Chest pain.   PRIMARY CARE PHYSICIAN:  Adult nurse at [facility].   HISTORY OF PRESENT ILLNESS:  This 55 year old gentleman with no previous  past medical or cardiac history has had a complaint of substernal chest pain  with radiation to bilateral arms since Friday which occurs on and off and  comes on with activity and resolves with rest.  Today, he took aspirin and  rest with resolution of his symptoms.  He does associated symptoms with  shortness of breath.  No diaphoresis, no nausea or vomiting.  This morning,  while moving cases of beer, he developed substernal chest pain rated a 7/10  in intensity.  Positive shortness of breath.  Took aspirin, sat down and the  chest pain gradually resolved.  The patient came to the emergency room at  the insistence of his wife.  He is now pain free.  Cardiac risk factors are  positive for tobacco.  Positive family history with both two brothers having  MI's in their 72's.  Negative for obesity.  Questionable cholesterol status.  Negative hypertension.   PAST MEDICAL HISTORY:  None.   PAST SURGICAL HISTORY:  1. Lumbar spine surgery, right knee.  2. Tendon repair right finger.  3. Two fingers on his right hand repaired after being caught.   MEDICATIONS:  None.   SOCIAL HISTORY:  Lives in [place] with his wife.  He is a Public affairs consultant.  He is married with three children.  Smokes approximately 2-1/2 packs  a day for 30+ years.  Occasional alcohol.  Denies drugs or herbal therapy.  Exertional discomfort with radiation to bilateral  arm.   FAMILY HISTORY:  Mother is deceased at 65, pancreatic CA.  Father deceased  at 20 of motor vehicle accident.  Two brothers with MI's in their 88's and  one sister alive and well.   REVIEW OF SYSTEMS:  CONSTITUTIONAL:  Denies fever, chills, sweats, weight  change, adenopathy.  HEENT:  Denies headache, nasal discharge, bleeding,  voice change or vertigo.  SKIN:  Denies rashes or lesions.  CARDIOPULMONARY:  Positive chest pain.  Positive shortness of breath.  Denies DOE, orthopnea,  PND, edema, syncope, presyncope or palpitations.  Denies MS.  Denies  arthralgia, joint swelling, deformity.  GU:  Denies frequency, urgency,  dysuria, hematuria.  NEUROLOGICAL/PSYCHOLOGICAL:  Denies weakness, numbness,  depression, anxiety.  GI:  No nausea, vomiting, diarrhea, bright red blood  per rectum.  ENDOCRINE:  Denies polyuria, polydipsia.  SKIN:  Hair and nail  changes.   PHYSICAL EXAMINATION:  VITAL SIGNS:  Pulse 69, respirations 20, blood  pressure 117/71 with weight of approximately 205.  GENERAL APPEARANCE:  This is a well-developed 55 year old male in no acute  distress.  HEENT:  Normocephalic, atraumatic.  Pupils equal, round and reactive to  light.  EOM's intact.  Sclerae are clear.  NECK:  Supple, nontender.  No bruits.  No thyromegaly.  No JVD.  CARDIOVASCULAR:  Heart rate regular rhythm.  Positive S1, S2 without  murmurs, rubs or gallops.  Normal PMI.  LUNGS:  Clear to auscultation bilaterally.  ABDOMEN:  Soft, nontender, no active bowel sounds.  EXTREMITIES:  No cyanosis, clubbing or edema.  NEUROLOGICAL:  No joint deformity, kyphosis or scoliosis.  Awake, alert and  oriented x3.   STUDIES:  EKG shows heart rate of 73 with normal sinus rhythm.  No acute  changes.  Normal access.  P-R interval 196.  QRS 83, QTC 394.   LABORATORY DATA:  Sodium 137, potassium 3.9, chloride 108, BUN 10,  creatinine 1.2, glucose 100.  H&H 15.3 and 45.  Point of care MB was 1.1.  Point of care  troponin less than 0.05.  Point of care myoglobin 88.3.   ASSESSMENT/PLAN:  As per Dr. Eden Emms:  Given the patient's strong smoking  history and exertional chest pain, it was thought that the patient undergo  cardiac catheterization.  The patient was scheduled for cardiac  catheterization later today as unable to be done tomorrow.  Will start the  patient on Lopressor 12.5 q.12h. as well as Lipitor 20 mg q.h.s. and follow  with a lipid panel to assess his cholesterol status.  Also, the patient will  have a consultation with smoking cessation.      Chinita Pester, C.R.N.P. [facility]                 Charlton Haws, M.D.    DS/MEDQ  D:  05/15/2004  T:  05/15/2004  Job:  [id number]   cc:   Orma Render [facility]

## 2011-03-14 NOTE — Discharge Summary (Signed)
NAME:  [name], [name]                         ACCOUNT NO.:  [id number]   MEDICAL RECORD NO.:  [id number]                   PATIENT TYPE:  INP   LOCATION:  6522                                 FACILITY:  [facility]   PHYSICIAN:  Salvadore Farber, M.D. [facility]         DATE OF BIRTH:  [birth year]   DATE OF ADMISSION:  05/15/2004  DATE OF DISCHARGE:  05/16/2004                                 DISCHARGE SUMMARY   DISCHARGE DIAGNOSES:  1. Admitted with exertional angina.  2. Cardiac enzymes are negative.  3. Culprit lesion in the proximal right coronary artery reduced with     stenting on May 15, 2004, 95% to 0.  4. Heavy tobacco use, ongoing.  5. Family history of coronary artery disease.  6. Anemia post-catheterization from a hemoglobin of 16 to a hemoglobin of     13.2.   DISCHARGE DISPOSITION:  The patient is going home on post-procedure day #1.  He has slight ooze from the right groin site, good perfusion of the right  lower extremity.  He will have a followup CBC in the office in one week.  He  will have an office visit in two weeks.  Dr. Eden Emms on June 03, 2004, at  noon.  [name] is ready for discharge on post-procedure day #1.  He is  having no complications, alert and oriented, no further chest pain.  His  hemoglobin on the day of discharge is 13.2, hematocrit 38, creatinine 1.  His CK was 87 post-procedure, CK-MB was 1.4.   PROCEDURE:  On May 15, 2004, left heart catheterization.  Culprit lesion in  the proximal right coronary artery 95%.  A  2.5 x 13 Cypher drug eluding stent was placed without complications.  He has  residual disease of 30% at the proximal to mid left circumflex and 30% at  the ostium of the TDA.  Ejection fraction is 65%.   HISTORY:  [name] is a 55 year old male with no prior history of cardiac  disease who has had a complaint of substernal chest pain with radiation to  both arms for the last five days.  This is intermittent.  It comes and goes.  It  increases with activity, resolves with rest.  On the day of admission,  May 15, 2004, he took an aspirin and rested with resolution of his pain.  He was short of breath.  He had no diaphoresis, no nausea, no vomiting.  While moving cases of beer he developed substernal chest pain with 7 to 8/10  in intensity and dyspnea on exertion.  He took an aspirin, sat down, the  chest pain resolved.  The patient came to the emergency room on the  insistence of his wife.  He is now pain-free.  Liver panel not obtained on  this hospitalization.  The patient goes home on the following medications:   DISCHARGE MEDICATIONS:  1. Enteric coated aspirin 325 mg daily.  2. Plavix 75 mg daily x1 year.  3. Lipitor 80 mg daily at bedtime.  4. Nitroglycerin 0.4 mg one tablet under the tongue every 5 minutes p.r.n.     chest pain.  5. Tylenol 325 mg one to two tabs q.4-6h. p.r.n. pain at the catheterization     site.   ACTIVITY:  He is asked not to strain or lift anything heavy for two weeks.  No sexual activity for two days.  He can begin driving on Saturday, May 18, 2004.   DIET:  Low sodium, low cholesterol diet.   WOUND CARE:  He may shower.  He is to call [phone] if he experiences  increased pain or swelling at the cath site.   FOLLOWUP:  Office visit on June 03, 2004, at noon, see Dr. Eden Emms.      Maple Mirza, P.A.                    Salvadore Farber, M.D. [facility]    GM/MEDQ  D:  05/16/2004  T:  05/16/2004  Job:  [id number]   cc:   Charlton Haws, M.D.   Dr. Orma Render [facility]

## 2011-03-14 NOTE — Cardiovascular Report (Signed)
NAME:  [name], [name]                         ACCOUNT NO.:  [id number]   MEDICAL RECORD NO.:  [id number]                   PATIENT TYPE:  INP   LOCATION:  6522                                 FACILITY:  [facility]   PHYSICIAN:  Salvadore Farber, M.D. [facility]         DATE OF BIRTH:  [birth year]   DATE OF PROCEDURE:  05/15/2004  DATE OF DISCHARGE:                              CARDIAC CATHETERIZATION   PROCEDURE:  Left heart catheterization, left ventriculography, coronary  angiography, drug-eluting stent placement in the right coronary artery.   INDICATIONS:  Mr. [name] is a 55 year old gentleman without prior history of  cardiovascular disease.  Risk factors are ongoing tobacco use and family  history of premature atherosclerotic disease.  He presents today with four  days of unstable angina.  Initial cardiac enzymes are negative.  He is  referred for diagnostic angiography with an eye to percutaneous  revascularization.   PROCEDURAL TECHNIQUE:  Informed consent was obtained.  Under 1% lidocaine  local anesthesia a 5-French sheath was placed in the right common femoral  artery using the modified Seldinger technique.  Diagnostic angiography and  ventriculography were performed using JL4, JR4, and pigtail catheters.  These demonstrated the culprit lesion to be in the proximal portion of the  RCA.  Decision was made to proceed with percutaneous revascularization.   The sheath was up sized to 6-French over a wire.  Anticoagulation was  initiated with heparin, double bolus eptifibatide, and 300 mg Plavix.  ACT  was maintained at greater than 200 seconds.  A 6-French JR4 guide was  advanced over a wire and engaged in the ostium of the RCA.  The lesion was  crossed using a Luge wire without difficulty.  It was then predilated using  a 2.5 x 9 mm Maverick at 6 atmospheres.  The lesion was then stented using a  2.5 x 13 mm Cypher at 16 atmospheres.  Repeat angiography was performed  after the  administration of intracoronary nitroglycerin.  The entirety of  the stent was then post dilated using a 3.0 mm PowerSail at 18 atmospheres.  Final angiography after the administration of intracoronary nitroglycerin  demonstrated no residual stenosis, TIMI 3 flow to the distal vasculature,  and no dissection.  The patient tolerated the procedure well and was  transferred to the holding room in stable condition.  Sheaths will be  removed there.   COMPLICATIONS:  None.   FINDINGS:  1. LV 128/5/12.  EF 65% without regional wall motion abnormality.  2. No aortic stenosis or mitral regurgitation.  3. Left main:  Angiographically normal.  4. LAD:  The LAD is a moderate sized vessel giving rise to two diagonal     branches.  There are luminal irregularities along the course of the LAD.     The first diagonal is approximately a 1 mm vessel.  It is totally     occluded proximally and fairly well collateralized from the  left system.  5. Ramus intermedius:  Small vessel, angiographically normal.  6. Circumflex:  Moderate sized vessel giving rise to a single obtuse     marginal.  There are luminal irregularities throughout its course.  There     is a 30-40% stenosis of the mid vessel.  7. RCA:  Large, dominant vessel.  There was a 95% stenosis of the proximal     vessel treated with drug-eluting stent to no residual stenosis.  There is     a 30% stenosis of the ostium of the PDA.   IMPRESSION/RECOMMENDATIONS:  Successful percutaneous intervention of the  culprit lesion in the proximal right coronary artery using a drug-eluting  stent.  Will plan on Plavix for at least one year given his unstable  presentation.  Aspirin should be continued indefinitely.  Smoking cessation  has been strongly recommended to him and his family.  Will initiate high  dose Statin.                                               Salvadore Farber, M.D. [facility]    WED/MEDQ  D:  05/15/2004  T:  05/16/2004  Job:  [id number]    cc:   Charlton Haws, M.D.

## 2011-03-14 NOTE — H&P (Signed)
NAME:  [name], [name]                         ACCOUNT NO.:  [id number]   MEDICAL RECORD NO.:  [id number]                   PATIENT TYPE:  EMS   LOCATION:  MAJO                                 FACILITY:  [facility]   PHYSICIAN:  Charlton Haws, M.D.                  DATE OF BIRTH:  1956-10-03   DATE OF ADMISSION:  05/23/2004  DATE OF DISCHARGE:                                HISTORY & PHYSICAL   CHIEF COMPLAINT:  Chest pain.   HISTORY OF PRESENT ILLNESS:  [name] is a 55 year old male patient who  has known coronary artery disease.  He underwent cardiac catheterization on  May 15, 2004 and was found to have a 95% right coronary artery lesion.  A  drug eluting stent was placed to this area.  He also has a known occluded  small first diagonal.  Yesterday the patient was seen in the office and was  noted to have a pseudoaneurysm of the right groin and this was compressed.  According to the patient he was re-scanned this morning and it was still  closed.   The patient woke up at 1:30 p.m. today and had back pain that was searing,  radiating into his chest.  He took sublingual nitroglycerin but this did not  help. We did check arm pressures and they are essentially equal.   He denies any palpitations, paroxysmal nocturnal dyspnea, orthopnea,  dizziness or pleuritic type pain.   ALLERGIES:  No known drug allergies.   MEDICATIONS:  1. Aspirin 325 mg daily.  2. Plavix 75 mg daily.  3. Lipitor 80 mg q.h.s.  4. Sublingual nitroglycerin p.r.n. chest pain.  5. Tylenol p.r.n.   PAST MEDICAL HISTORY:  1. Coronary artery disease as above.  2. Hyperlipidemia, treated.  3. Status post lumbar surgery.  4. Right knee surgery.  5. Right finger tendon repair.   SOCIAL HISTORY:  He lives in [place] with his wife and 3 children.  He is  a Sports administrator. Tobacco history of 1.5 packs/day.  Occasional alcohol  use, recommended low cholesterol diet.   FAMILY HISTORY:  Mom died of pancreatic  cancer at age 71.  Dad died at age  40 secondary to a motor vehicle accident.  He has 2 brothers with myocardial  infarctions in their 66's.   REVIEW OF SYSTEMS:  As above, otherwise negative.   PHYSICAL EXAMINATION:  VITAL SIGNS:  Temperature 98, pulse 78, respirations  18, blood pressure 135/78, O2 saturations 97% on room air.  Recheck of blood  pressure reveals a left arm pressure of 118/75; right arm 112/71.  GENERAL:  He is in no acute distress.  HEENT:  Grossly normal.  NECK:  No carotid or subclavian bruits, no JVD or thyromegaly.  CHEST:  Clear to auscultation bilaterally. No wheezing or rhonchi.  HEART:  Regular rate and rhythm, no murmur or rub.  ABDOMEN:  Nontender, nondistended.  No  masses, no bruits.  He does have a  soft right femoral bruit.  EXTREMITIES:  He has good lower extremity pulses with no peripheral edema.  NEURO:  Cranial nerves II-XII grossly intact.   Chest x-ray is pending.  His EKG reveals normal sinus rhythm with rate of 62  with no acute ST wave changes.  Last studies revealed PSE negative x1.  CBC  within normal limits.  Potassium 3.8, BUN 14, creatinine 1.1.   ASSESSMENT:  1. Chest pain.  Will check a chest CT, follow enzymes as well as EKGs.  Will     admit him for observation.  2. Coronary artery disease.  3. Hyperlipidemia, treated.   The patient was seen and examined by Dr. Charlton Haws.  He will be admitted  for observation and the above lab tests.      Guy Franco, P.A. [facility]                      Charlton Haws, M.D.    LB/MEDQ  D:  05/23/2004  T:  05/23/2004  Job:  [id number]

## 2011-03-14 NOTE — Cardiovascular Report (Signed)
NAMEJAMARRI, [name]               ACCOUNT NO.:  [id number]   MEDICAL RECORD NO.:  [id number]          PATIENT TYPE:  OIB   LOCATION:  6501                         FACILITY:  [facility]   PHYSICIAN:  Salvadore Farber, M.D. LHCDATE OF BIRTH:  10/10/1956   DATE OF PROCEDURE:  11/19/2004  DATE OF DISCHARGE:                              CARDIAC CATHETERIZATION   PROCEDURE:  Left heart catheterization with ventriculography, coronary  angiography.   INDICATIONS:  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 I placed a Cypher stent in his  proximal RCA. He has since had some recurrent chest discomfort in the  setting of continued tobacco abuse. These have been felt to be most likely  GI but, due to persistence of the symptoms despite therapy for reflux, he is  referred for diagnostic angiography to exclude in-stent restenosis or  progression of his coronary disease.   PROCEDURAL TECHNIQUE:  Informed consent was obtained.  Under 1% Lidocaine  local anesthesia, a 4-French sheath was placed in the left common femoral  artery using modified Seldinger technique. Diagnostic angiography and  ventriculography were performed using JL-4, JR-4, and pigtail catheters. The  patient tolerated the procedure well and was transferred to the holding area  in stable condition.  Sheath was removed there.   COMPLICATIONS:  None.   FINDINGS:  1.  LV: 137/6/15. EF 55% without regional wall motion abnormality.  2.  No aortic stenosis or mitral regurgitation.  3.  Left main: Angiographically normal.  4.  LAD: Moderate size vessel giving rise to a single large diagonal. There      are minor luminal irregularities throughout the LAD proper with a 50%      ostial stenosis of the diagonal.  5.  Circumflex: Moderate size vessel giving rise to a single large obtuse      marginal. There is a 30% stenosis of the A-V groove circumflex in its      mid section.  6.   RCA: Large, dominant vessel. The stent in the proximal vessel was widely      patent. There is a 20% stenosis in the proximal portion of the PDA.   IMPRESSION AND RECOMMENDATIONS:  The right coronary artery stent is widely  patent. There is no change in his moderate nonobstructive disease in the  left anterior descending and circumflex.  I suspect that his chest and  epigastric discomfort are, in fact, gastrointestinal in etiology. Will  continue medical therapy. Smoking cessation was strongly advised.    WED/MEDQ  D:  11/19/2004  T:  11/19/2004  Job:  [id number]   cc:   Charlton Haws, M.D.

## 2011-03-14 NOTE — Discharge Summary (Signed)
NAME:  [name], [name]                         ACCOUNT NO.:  [id number]   MEDICAL RECORD NO.:  [id number]                   PATIENT TYPE:  INP   LOCATION:  3711                                 FACILITY:  [facility]   PHYSICIAN:  Charlton Haws, M.D.                  DATE OF BIRTH:  1956/01/09   DATE OF ADMISSION:  05/23/2004  DATE OF DISCHARGE:  05/24/2004                                 DISCHARGE SUMMARY   PROCEDURE:  1. Chest CT.  2. Abdominal CT.   HOSPITAL COURSE:  Mr. Corella is a 55 year old male with a history of  coronary artery disease.  He had a stent to the proximal RCA on May 23, 2004.  He did well and was discharged.  He later had a right pseudoaneurysm  which was compressed on May 22, 2004.  On May 23, 2004, Mr. Nosbisch took a  nap and woke up at approximately 1:30 p.m. complaining of back pain and  substernal chest pain.  Nitroglycerin did not help.  Blood pressure in both  arms was equal and he was admitted for further evaluation and treatment.   It was felt that his pain was atypical for coronary pain but there was  concern for dissection or PE.  A chest and abdominal CT was performed which  showed no dissection, no PE, and no AAA.  He was kept overnight for  observation.   The next day, his enzymes were negative for MI.  His hemoglobin and  hematocrit had not dropped significantly and he had no bruit in his groin.  There was no calf tenderness and the ecchymosis from the pseudoaneurysm was  resolving.   Dr. Salvadore Farber evaluated Mr. Wahlen and felt that it was likely GI  chest pain.  He felt that he was stable for discharge home as he had three  hours of chest pain with negative enzymes.  Dr. Salvadore Farber felt that  this essentially precluded stent thrombosis.   CONDITION ON DISCHARGE:  Stable.   DISCHARGE DIAGNOSES:  1. Chest pain, questionable gastrointestinal in origin.  Follow up with     cardiology and primary care.  2. Status post cardiac  catheterization on May 15, 2004 with a Cypher stent     to the proximal right coronary artery, no other critical disease and an     ejection fraction of 65%.  3. Hyperlipidemia.  4. Status post lumbar surgery, knee surgery, and right finger surgery.  5. Family history of coronary artery disease in his brothers.  6. Ongoing tobacco use.  7. 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  8. History of obstructive sleep apnea on CPAP.   DISCHARGE INSTRUCTIONS:  1. His activity level is to be gradually increased.  2. He is to stick to a low fat diet.  3. He is to keep his June 03, 2004  with Dr. Charlton Haws, his P.A., and he     is to follow up with Dr. Patrici Ranks as needed.   DISCHARGE MEDICATIONS:  1. Sublingual nitroglycerin p.r.n.  2. Aspirin 325 mg q.d.  3. Plavix 75 mg q.d.  4. Lipitor 80 mg q.h.s.      Theodore Demark, P.A. [facility]                  Charlton Haws, M.D.    RB/MEDQ  D:  05/24/2004  T:  05/24/2004  Job:  [id number]   cc:   Patrici Ranks, M.D.   Charlton Haws, M.D.

## 2011-03-14 NOTE — Op Note (Signed)
[place]  Patient:    [name], [name] Visit Number: [id number] MRN: [id number]          Service Type: DSU Location: [facility] Attending Physician:  Marlowe Shores Dictated by:   Artist Pais Mina Marble, M.D. Proc. Date: 05/04/02 Admit Date:  05/04/2002 Discharge Date: 05/04/2002                             Operative Report  PREOPERATIVE DIAGNOSIS:  Right long and index finger dorsal lacerations.  POSTOPERATIVE DIAGNOSIS:  Right long and index finger dorsal lacerations.  PROCEDURE:  Right long finger extensor tendon repair and right index finger laceration repair.  SURGEON:  Artist Pais. Mina Marble, M.D.  ASSISTANT:  ______  ANESTHESIA:  Bier block.  TOURNIQUET TIME:  30 minutes.  COMPLICATIONS:  None.  DRAIN:  None.  OPERATIVE REPORT:  The patient was taken to the operating room, given Bier block anesthesia, and the right upper extremity was prepped in the usual sterile fashion.  Once this was done, two J-shaped dorsal lacerations over the index and long finger were approached.  The index finger had a large ulnar based flap as well as a large ulnar based flap on the long finger.  These were explored, debrided of clot and nonviable material.  The index finger was closed with 5-0 nylon in a combination of simple and horizontal mattress sutures and the long finger had extensor tendon repair that was repaired with 4-0 [place] and then also closed with 5-0 nylon with a combination of simple and horizontal mattress sutures.  Sterile dressing with xeroform, 4x4 fluff, and a volar splint for the long finger extensor tendon repair was applied. The patient tolerated the procedure well and was recovered in stable fashion. Dictated by:   Artist Pais Mina Marble, M.D. Attending Physician:  Marlowe Shores DD:  05/04/02 TD:  05/07/02 Job: [id number]

## 2011-03-14 NOTE — Discharge Summary (Signed)
NAMEDENI, LEFEVER               ACCOUNT NO.:  [id number]   MEDICAL RECORD NO.:  [id number]          PATIENT TYPE:  OBV   LOCATION:  6531                         FACILITY:  [facility]   PHYSICIAN:  [name]. Eden Emms, MD, FACCDATE OF BIRTH:  06-14-1956   DATE OF ADMISSION:  10/12/2006  DATE OF DISCHARGE:  10/13/2006                         DISCHARGE SUMMARY - REFERRING   PRIMARY CARE PHYSICIAN:  Karie Schwalbe, M.D.   PRIMARY CARDIOLOGIST:  [name]. Eden Emms, M.D., [facility]   DISCHARGE DIAGNOSES:  1. Acute coronary syndrome.  2. Progressive coronary artery disease status post drug-eluting stent      to the circumflex, as described.  3. Tobacco use.  4. Hypertension.  5. Obesity.  6. Noncompliance with tobacco cessation, diet, and exercise programs.  7. Hyperlipidemia that is specifically in regards to triglycerides and      high-density lipoproteins.  8. Hyperglycemia.  No documented history of diabetes.   PROCEDURES PERFORMED:  Cardiac catheterization on October 12, 2006 with  drug-eluting stent to the circumflex by Dr. Excell Seltzer.   HISTORY OF PRESENT ILLNESS:  Mr. Calleros is a 55 year old white male who  presented to [facility] emergency room complaining of chest discomfort.  He was awakened from sleep at approximately 2 a.m. with anterior chest  tightness unassociated with nausea, vomiting, diaphoresis, shortness of  breath, or radiation.  He finally woke his wife up around 4 a.m., and  she drove him to the hospital.  He did not think to take any sublingual  nitroglycerin.   PAST MEDICAL HISTORY:  1. Obesity.  2. Obstructive sleep apnea with CPAP.  3. Hyperlipidemia.  4. 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  EF by      catheterization in 2006 showed an EF of 55%.   LABORATORY DATA:  Admission weight was 211.9 pounds.  Admission H&H was  14.7 and 41.9, normal indices, platelets 171, WBC  8.9.  Subsequent  hematology was unremarkable.  PTT 33, PT 13.5.  Sodium 139, potassium  3.5, BUN 17, creatinine 1.2.  Normal LFTs.  Glucose 105.  Prior to  discharge, potassium was 4.4, glucose 134, BUN 9, creatinine 1.0.  CK-  MB's and relative indexes were unremarkable x3.  Troponins were 0.1,  0.7, and 0.11.  Fasting lipids showed a total cholesterol of 122,  triglycerides 367, HDL 37, LDL 12.   EKG on admission showed normal sinus rhythm, normal axis, nonspecific ST-  T wave changes, early R wave.  Chest x-ray did not show any active  disease.   HOSPITAL COURSE:  Mr. Gettel was placed on IV heparin as well as his  home medications and a low-dose beta-blocker and admitted to [facility].  Given his chest discomfort and history, it was felt that he  should undergo a cardiac catheterization after being evaluated by Dr.  Jens Som.  Catheterization was performed on October 12, 2006 by Dr.  Excell Seltzer.  This showed a widely patent RCA stent with nonobstructive  lesion in the proximal RCA, 40% mid-LAD,  20% proximal circumflex, 95/40%  mid-circumflex.  EF was 65%.  Dr. Excell Seltzer, after review, placed a Taxus  stent to the mid-circumflex, reducing the 95% and 40% lesion to 0%.  Dr.  Excell Seltzer recommended aspirin and Plavix at least for 12 months.  He had a  long discussion with him about cardiac risk factor modification,  including tobacco cessation, exercise, and nutrition.   By October 13, 2006, the catheterization site was intact.  He was  ambulating the halls without difficulty.  Dr. Eden Emms reviewed and felt  that the patient could be discharged home.  At the time of discharge,  lipids were pending but are available at this dictation.  At the time of  discharge, the patient refused tobacco cessation and cardiac  rehabilitation consultations.   DISPOSITION:  Mr. Rogerson left the hospital prior to seeing appointments  and arranged followups.  He was advised activity restrictions and  wound  care per supplemental sheet.  The office will contact him with a  followup appointment with Dr. Eden Emms on November 11, 2006 at 10:30 a.m.  He was advised to continue Lipitor 80 mg q.h.s., nitroglycerin 0.4 as  needed, Prilosec 20 mg daily.  He was given prescriptions for Plavix 75  mg p.o. daily and asked to continue aspirin 325 mg daily.  He was  advised on tobacco cessation and participation in cardiac rehabilitation  with weight loss would be beneficial.   DISCHARGE TIME:  Approximately 20 minutes.      Joellyn Rued, PA-C      [name]. Eden Emms, MD, [facility]     EW/MEDQ  D:  10/13/2006  T:  10/13/2006  Job:  [id number]   cc:   Karie Schwalbe, MD  [name]. Eden Emms, MD, [facility]

## 2011-04-09 ENCOUNTER — Other Ambulatory Visit: Payer: Self-pay | Admitting: *Deleted

## 2011-04-09 MED ORDER — LISINOPRIL 10 MG PO TABS: 10.0000 mg | ORAL_TABLET | Freq: Every day | ORAL | Status: DC

## 2011-07-25 LAB — BASIC METABOLIC PANEL
CO2: 26 mEq/L (ref 19–32)
GFR calc non Af Amer: >60 mL/min (ref >60)
Glucose, Bld: 126 mg/dL — ABNORMAL HIGH (ref 70–99)
Potassium: 3.9 mEq/L (ref 3.5–5.1)
Sodium: 140 mEq/L (ref 135–145)

## 2011-07-25 LAB — CK TOTAL AND CKMB (NOT AT ARMC): Relative Index: 1.4 (ref 0.0–2.5)

## 2011-07-25 LAB — DIFFERENTIAL
Eosinophils Relative: 5 % (ref 0–5)
Lymphocytes Relative: 20 % (ref 12–46)
Lymphs Abs: 1.3 10*3/uL (ref 0.7–4.0)

## 2011-07-25 LAB — TROPONIN I: Troponin I: <0.01 ng/mL (ref 0.00–0.06)

## 2011-07-25 LAB — CBC
HCT: 39.9 % (ref 39.0–52.0)
Hemoglobin: 13.6 g/dL (ref 13.0–17.0)
MCHC: 34.1 g/dL (ref 30.0–36.0)
RDW: 12.4 % (ref 11.5–15.5)

## 2011-08-14 ENCOUNTER — Telehealth: Payer: Self-pay | Admitting: Cardiovascular Disease

## 2011-08-14 MED ORDER — CLOPIDOGREL BISULFATE 75 MG PO TABS: 75.0000 mg | ORAL_TABLET | Freq: Every day | ORAL | Status: DC

## 2011-08-14 NOTE — Telephone Encounter (Signed)
Pt made an appt and would like Plavix called into [facility].(generic)

## 2011-08-26 ENCOUNTER — Other Ambulatory Visit: Payer: Self-pay | Admitting: *Deleted

## 2011-08-26 MED ORDER — LISINOPRIL 10 MG PO TABS: 10.0000 mg | ORAL_TABLET | Freq: Every day | ORAL | Status: DC

## 2011-09-04 ENCOUNTER — Encounter: Payer: Self-pay | Admitting: Cardiovascular Disease

## 2011-09-04 ENCOUNTER — Encounter: Payer: Self-pay | Admitting: *Deleted

## 2011-09-05 ENCOUNTER — Ambulatory Visit (INDEPENDENT_AMBULATORY_CARE_PROVIDER_SITE_OTHER): Payer: Self-pay | Admitting: Cardiovascular Disease

## 2011-09-05 ENCOUNTER — Encounter: Payer: Self-pay | Admitting: Cardiovascular Disease

## 2011-09-05 DIAGNOSIS — E785 Hyperlipidemia, unspecified: Secondary | ICD-10-CM

## 2011-09-05 DIAGNOSIS — I2510 Atherosclerotic heart disease of native coronary artery without angina pectoris: Secondary | ICD-10-CM

## 2011-09-05 DIAGNOSIS — I10 Essential (primary) hypertension: Secondary | ICD-10-CM

## 2011-09-05 MED ORDER — LISINOPRIL 10 MG PO TABS: 10.0000 mg | ORAL_TABLET | Freq: Every day | ORAL | Status: DC

## 2011-09-05 MED ORDER — CLOPIDOGREL BISULFATE 75 MG PO TABS: 75.0000 mg | ORAL_TABLET | Freq: Every day | ORAL | Status: DC

## 2011-09-05 MED ORDER — NITROGLYCERIN 0.4 MG SL SUBL: 0.4000 mg | SUBLINGUAL_TABLET | SUBLINGUAL | Status: DC | PRN

## 2011-09-05 NOTE — Assessment & Plan Note (Signed)
Stable no angina.  Continue ASA and Plavix.  Refills including nitro called in

## 2011-09-05 NOTE — Progress Notes (Signed)
[name] is seen today for F/U of his CAD. He had a stent to the RCA in 2005 and circ in 2007. he is not having any SSCP. He continues to smoke and despite multiple efforts has no motivation to quit. He needs a lipid and liver profile for his statin. [name] he has been compliant with his meds with mild chronic exertional dyspnea from COPD/smoking. No cough, sputum, or pleuritic pain.  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  All other systems reviewed and negative  General: Affect appropriate Healthy:  appears stated age HEENT: normal Neck supple with no adenopathy JVP normal no bruits no thyromegaly Lungs clear with no wheezing and good diaphragmatic motion Heart:  S1/S2 no murmur,rub, gallop or click PMI normal Abdomen: benighn, BS positve, no tenderness, no AAA no bruit.  No HSM or HJR Distal pulses intact with no bruits No edema Neuro non-focal Skin warm and dry No muscular weakness   Current Outpatient Prescriptions  Medication Sig Dispense Refill  . aspirin 325 MG tablet Take 325 mg by mouth daily.        . clopidogrel (PLAVIX) 75 MG tablet Take 1 tablet (75 mg total) by mouth daily.  30 tablet  0  . lisinopril (PRINIVIL,ZESTRIL) 10 MG tablet Take 1 tablet (10 mg total) by mouth daily.  30 tablet  12    Allergies  Review of patient's allergies indicates no known allergies.  Electrocardiogram:  Patient declines to have one  Assessment and Plan

## 2011-09-05 NOTE — Patient Instructions (Signed)
Your physician wants you to follow-up in: one year You will receive a reminder letter in the mail two months in advance. If you don't receive a letter, please call our office to schedule the follow-up appointment.  

## 2011-09-05 NOTE — Assessment & Plan Note (Signed)
Well controlled.  Continue current medications and low sodium Dash type diet.    

## 2011-09-05 NOTE — Assessment & Plan Note (Signed)
Cholesterol is at goal.  Continue current dose of statin and diet Rx.  No myalgias or side effects.  F/U  LFT's in 6 months. No results found for this basename: LDLCALC             

## 2011-09-17 ENCOUNTER — Encounter: Payer: Self-pay | Admitting: Internal Medicine

## 2011-12-16 ENCOUNTER — Encounter: Payer: Self-pay | Admitting: *Deleted

## 2011-12-16 ENCOUNTER — Telehealth: Payer: Self-pay | Admitting: Cardiovascular Disease

## 2011-12-16 NOTE — Telephone Encounter (Signed)
Patient wife Olegario Messier    Patient needs letter stating no work restrictions, please return call to patient wife Olegario Messier on hm# [phone]

## 2011-12-16 NOTE — Telephone Encounter (Signed)
Spoke with pt wife, letter generated and faxed to [phone]

## 2012-04-12 ENCOUNTER — Other Ambulatory Visit: Payer: Self-pay | Admitting: Gastroenterology

## 2012-04-20 ENCOUNTER — Telehealth: Payer: Self-pay | Admitting: Cardiovascular Disease

## 2012-04-20 NOTE — Telephone Encounter (Signed)
New msg Pt's wife wants to talk to you about getting a new cpap machine. Please call

## 2012-04-20 NOTE — Telephone Encounter (Signed)
I spoke with pt wife about CPAP machine.   It has been since 2004 when last recalibrated with Dr. Shelle Iron. This was last sleep study.  He has not had a new cpap in about 15 years according to wife  Pt needs this done this week as they just found out his deductible restarts again on July 1st.  They did not realize this until today. She will call Dr. Shelle Iron office today to try and get worked in with someone this week.  Mylo Red RN

## 2012-04-23 ENCOUNTER — Encounter: Payer: Self-pay | Admitting: Internal Medicine

## 2012-04-23 ENCOUNTER — Ambulatory Visit (INDEPENDENT_AMBULATORY_CARE_PROVIDER_SITE_OTHER): Payer: BC Managed Care – PPO | Admitting: Internal Medicine

## 2012-04-23 VITALS — BP 112/64 | HR 83 | Ht 69.0 in | Wt 210.8 lb

## 2012-04-23 DIAGNOSIS — J4489 Other specified chronic obstructive pulmonary disease: Secondary | ICD-10-CM

## 2012-04-23 DIAGNOSIS — G4733 Obstructive sleep apnea (adult) (pediatric): Secondary | ICD-10-CM

## 2012-04-23 DIAGNOSIS — J449 Chronic obstructive pulmonary disease, unspecified: Secondary | ICD-10-CM

## 2012-04-23 NOTE — Progress Notes (Signed)
04/23/12- 3 yoM smoker seen for Obstructive sleep apnea. Former patient of [facility]sleep study in 2004; currently uses [facility] but would like to change to [facility]. Original sleep study in 2004-record of that study is not yet available. He has been successfully using CPAP 10 CWP/Advanced with a nasal mask. This as helped substantially with no snoring and no daytime sleepiness. The machine is getting old and he wants replacement. He would also like to change DME companies to work with [facility] because they are close to his home. Usual bedtime 9:30 to 10 PM, sleep latency 20-30 minutes, waking up to one time per night before getting up at 4:55 AM. History of esophageal dilatation, hypertension, MI with angioplasty. but no history of nasal trauma. No ENT surgery. Smoking 1-1/2 packs per day, ETOH 2 drinks per day. Denies history of respiratory disease. Working as a Facilities manager.  Prior to Admission medications   Medication Sig Start Date End Date Taking? Authorizing Provider  aspirin 325 MG tablet Take 325 mg by mouth daily.     Yes Historical Provider, MD  clopidogrel (PLAVIX) 75 MG tablet Take 1 tablet (75 mg total) by mouth daily. 09/05/11 09/04/12 Yes Wendall Stade, MD  lisinopril (PRINIVIL,ZESTRIL) 10 MG tablet Take 1 tablet (10 mg total) by mouth daily. 09/05/11  Yes Wendall Stade, MD  Magnesium 250 MG TABS Take 1 tablet by mouth daily.   Yes Historical Provider, MD  nitroGLYCERIN (NITROSTAT) 0.4 MG SL tablet Place 1 tablet (0.4 mg total) under the tongue every 5 (five) minutes as needed for chest pain. 09/05/11 09/04/12 Yes Wendall Stade, MD  Omega-3 Fatty Acids (FISH OIL) 1200 MG CAPS Take 1 capsule by mouth daily.   Yes Historical Provider, MD  omeprazole (PRILOSEC OTC) 20 MG tablet Take 20 mg by mouth daily.   Yes Historical Provider, MD   Past Medical History  Diagnosis Date  . HYPERTRIGLYCERIDEMIA   . HYPERTENSION   . HYPERLIPIDEMIA   . HEMORRHOIDS   . CORONARY ARTERY  DISEASE   . SLEEP APNEA   . OBESITY   . GERD   . DYSPHAGIA UNSPECIFIED   . DIVERTICULOSIS, COLON   . COPD   . COLONIC POLYPS, HX OF   . CHEST PAIN    Past Surgical History  Procedure Date  . Cardiac catheterization   . Right long finger extensor tendon repair    Family History  Problem Relation Age of Onset  . Cancer Mother   . Heart disease Brother    History   Social History  . Marital Status: Married    Spouse Name: N/A    Number of Children: N/A  . Years of Education: N/A   Occupational History  . Not on file.   Social History Main Topics  . Smoking status: Current Everyday Smoker -- 1.5 packs/day  . Smokeless tobacco: Not on file  . Alcohol Use: Not on file  . Drug Use: Not on file  . Sexually Active: Not on file   Other Topics Concern  . Not on file   Social History Narrative  . No narrative on file   ROS-see HPI Constitutional:   No-   weight loss, night sweats, fevers, chills, fatigue, lassitude. HEENT:   No-  headaches, difficulty swallowing, tooth/dental problems, sore throat,       No-  sneezing, itching, ear ache, nasal congestion, post nasal drip,  CV:  No-   chest pain, orthopnea, PND, swelling in lower extremities, anasarca,  dizziness, palpitations Resp: No-   shortness of breath with exertion or at rest.              No-   productive cough,  No non-productive cough,  No- coughing up of blood.              No-   change in color of mucus.  No- wheezing.   Skin: No-   rash or lesions. GI:  No-   heartburn, indigestion, abdominal pain, nausea, vomiting, diarrhea,                 change in bowel habits, loss of appetite GU: No-   dysuria, change in color of urine, no urgency or frequency.  No- flank pain. MS:  No-   joint pain or swelling.  No- decreased range of motion.  No- back pain. Neuro-     nothing unusual Psych:  No- change in mood or affect. No depression or anxiety.  No memory loss.  OBJ- Physical  Exam General- Alert, Oriented, Affect-appropriate, Distress- none acute, medium build Skin- rash-none, lesions- none, excoriation- none Lymphadenopathy- none Head- atraumatic            Eyes- Gross vision intact, PERRLA, conjunctivae and secretions clear            Ears- Hearing, canals-normal            Nose- Clear, no-Septal dev, mucus, polyps, erosion, perforation             Throat- Mallampati IV , mucosa clear , drainage- none, tonsils- atrophic Neck- flexible , trachea midline, no stridor , thyroid nl, carotid no bruit Chest - symmetrical excursion , unlabored           Heart/CV- RRR , no murmur , no gallop  , no rub, nl s1 s2                           - JVD- none , edema- none, stasis changes- none, varices- none           Lung- clear to P&A, wheeze- none, cough- none , dullness-none, rub- none           Chest wall-  Abd-  Br/ Gen/ Rectal- Not done, not indicated Extrem- cyanosis- none, clubbing, none, atrophy- none, strength- nl Neuro- grossly intact to observation

## 2012-04-23 NOTE — Patient Instructions (Addendum)
Order- DME Replacement CPAP machine, humidifier, 10 cwp, mask of choice and supplies    Dx OSA

## 2012-04-27 NOTE — Assessment & Plan Note (Signed)
History of very good compliance and control with CPAP 10. Needs new machine. We need to find old sleep study, otherwise he may need new documentation as he changes DME companies for convenience, to [facility]. Obstructive sleep apnea represents significant comorbidity in the context of tobacco abuse and coronary disease with history of MI.

## 2012-04-27 NOTE — Assessment & Plan Note (Signed)
He doesn't admit to significant respiratory disease despite his long smoking history. Recommend PFT and smoking cessation.

## 2012-06-24 ENCOUNTER — Encounter: Payer: Self-pay | Admitting: *Deleted

## 2012-06-24 ENCOUNTER — Encounter: Payer: Self-pay | Admitting: Internal Medicine

## 2012-06-24 ENCOUNTER — Ambulatory Visit (INDEPENDENT_AMBULATORY_CARE_PROVIDER_SITE_OTHER): Payer: BC Managed Care – PPO | Admitting: Internal Medicine

## 2012-06-24 VITALS — BP 122/80 | HR 84 | Ht 69.0 in | Wt 209.0 lb

## 2012-06-24 DIAGNOSIS — F17200 Nicotine dependence, unspecified, uncomplicated: Secondary | ICD-10-CM

## 2012-06-24 DIAGNOSIS — Z720 Tobacco use: Secondary | ICD-10-CM

## 2012-06-24 DIAGNOSIS — G4733 Obstructive sleep apnea (adult) (pediatric): Secondary | ICD-10-CM

## 2012-06-24 DIAGNOSIS — J449 Chronic obstructive pulmonary disease, unspecified: Secondary | ICD-10-CM

## 2012-06-24 NOTE — Progress Notes (Signed)
04/23/12- 1 yoM smoker seen for Obstructive sleep apnea. Former patient of [facility]sleep study in 2004; currently uses [facility] but would like to change to [facility]. Original sleep study in 2004-record of that study is not yet available. He has been successfully using CPAP 10 CWP/Advanced with a nasal mask. This as helped substantially with no snoring and no daytime sleepiness. The machine is getting old and he wants replacement. He would also like to change DME companies to work with [facility] because they are close to his home. Usual bedtime 9:30 to 10 PM, sleep latency 20-30 minutes, waking up to one time per night before getting up at 4:55 AM. History of esophageal dilatation, hypertension, MI with angioplasty. but no history of nasal trauma. No ENT surgery. Smoking 1-1/2 packs per day, ETOH 2 drinks per day. Denies history of respiratory disease. Working as a Facilities manager.  06/24/12- 56 yoM smoker seen for Obstructive sleep apnea. Former patient of [facility] in 2004; currently uses [facility] but would like to change to [facility]. Wears CPAP 10/ [facility] every night for approximately 8 hours or more; pressure working well for patient. Denies snoring and less daytime sleepiness. He is pleased.  ROS-see HPI Constitutional:   No-   weight loss, night sweats, fevers, chills, fatigue, lassitude. HEENT:   No-  headaches, difficulty swallowing, tooth/dental problems, sore throat,       No-  sneezing, itching, ear ache, nasal congestion, post nasal drip,  CV:  No-   chest pain, orthopnea, PND, swelling in lower extremities, anasarca, dizziness, palpitations Resp: No-   shortness of breath with exertion or at rest.              No-   productive cough,  No non-productive cough,  No- coughing up of blood.              No-   change in color of mucus.  No- wheezing.   Skin: No-   rash or lesions. GI:  No-   heartburn, indigestion, abdominal pain, nausea, vomiting,  GU: . MS:  No-   joint  pain or swelling.  Neuro-     nothing unusual Psych:  No- change in mood or affect. No depression or anxiety.  No memory loss.  OBJ- Physical Exam General- Alert, Oriented, Affect-appropriate, Distress- none acute, medium build Skin- rash-none, lesions- none, excoriation- none Lymphadenopathy- none Head- atraumatic            Eyes- Gross vision intact, PERRLA, conjunctivae and secretions clear            Ears- Hearing, canals-normal            Nose- Clear, no-Septal dev, mucus, polyps, erosion, perforation             Throat- Mallampati IV , mucosa clear , drainage- none, tonsils- atrophic Neck- flexible , trachea midline, no stridor , thyroid nl, carotid no bruit Chest - symmetrical excursion , unlabored           Heart/CV- RRR , no murmur , no gallop  , no rub, nl s1 s2                           - JVD- none , edema- none, stasis changes- none, varices- none           Lung- clear to P&A, wheeze- none, cough- none , dullness-none, rub- none  Chest wall-  Abd-  Br/ Gen/ Rectal- Not done, not indicated Extrem- cyanosis- none, clubbing, none, atrophy- none, strength- nl Neuro- grossly intact to observation

## 2012-06-24 NOTE — Patient Instructions (Addendum)
Continue CPAP 10/ Advanced  Order- office spirometry dx COPD- done- scores are within normal limits today. It still will be a good idea for you to stop smoking, before the cigarettes damage your heart and lungs.

## 2012-07-01 DIAGNOSIS — Z720 Tobacco use: Secondary | ICD-10-CM | POA: Insufficient documentation

## 2012-07-01 NOTE — Assessment & Plan Note (Signed)
Good compliance and control at 10 CWP. He has decided to stay with Advanced

## 2012-07-01 NOTE — Assessment & Plan Note (Addendum)
Office from a [place] gave really pretty good scores so he does not have significant obstructive airways disease.

## 2012-07-01 NOTE — Assessment & Plan Note (Signed)
Smoking cessation support and medical indications were reviewed with him today. He plans followup chest x-ray with his cardiologist next visit.

## 2012-07-21 ENCOUNTER — Encounter: Payer: Self-pay | Admitting: Internal Medicine

## 2012-08-02 ENCOUNTER — Ambulatory Visit (INDEPENDENT_AMBULATORY_CARE_PROVIDER_SITE_OTHER): Payer: BC Managed Care – PPO | Admitting: Physician Assistant

## 2012-08-02 ENCOUNTER — Telehealth: Payer: Self-pay | Admitting: *Deleted

## 2012-08-02 ENCOUNTER — Encounter: Payer: Self-pay | Admitting: *Deleted

## 2012-08-02 ENCOUNTER — Encounter: Payer: Self-pay | Admitting: Physician Assistant

## 2012-08-02 VITALS — BP 128/70 | HR 75 | Ht 69.0 in | Wt 210.0 lb

## 2012-08-02 DIAGNOSIS — F17200 Nicotine dependence, unspecified, uncomplicated: Secondary | ICD-10-CM

## 2012-08-02 DIAGNOSIS — R079 Chest pain, unspecified: Secondary | ICD-10-CM

## 2012-08-02 DIAGNOSIS — I10 Essential (primary) hypertension: Secondary | ICD-10-CM

## 2012-08-02 DIAGNOSIS — Z720 Tobacco use: Secondary | ICD-10-CM

## 2012-08-02 DIAGNOSIS — I2510 Atherosclerotic heart disease of native coronary artery without angina pectoris: Secondary | ICD-10-CM

## 2012-08-02 DIAGNOSIS — E785 Hyperlipidemia, unspecified: Secondary | ICD-10-CM

## 2012-08-02 LAB — CBC WITH DIFFERENTIAL/PLATELET
Basophils Relative: 0.9 % (ref 0.0–3.0)
Eosinophils Absolute: 0.6 10*3/uL (ref 0.0–0.7)
Eosinophils Relative: 7.5 % — ABNORMAL HIGH (ref 0.0–5.0)
HCT: 45.7 % (ref 39.0–52.0)
Hemoglobin: 15.4 g/dL (ref 13.0–17.0)
MCHC: 33.8 g/dL (ref 30.0–36.0)
MCV: 93 fl (ref 78.0–100.0)
Monocytes Absolute: 0.6 10*3/uL (ref 0.1–1.0)
Neutro Abs: 5 10*3/uL (ref 1.4–7.7)
Neutrophils Relative %: 64.7 % (ref 43.0–77.0)
RBC: 4.91 Mil/uL (ref 4.22–5.81)
WBC: 7.7 10*3/uL (ref 4.5–10.5)

## 2012-08-02 LAB — PROTIME-INR: Prothrombin Time: 10.8 s (ref 10.2–12.4)

## 2012-08-02 LAB — BASIC METABOLIC PANEL
CO2: 24 mEq/L (ref 19–32)
Chloride: 105 mEq/L (ref 96–112)
Potassium: 4 mEq/L (ref 3.5–5.1)
Sodium: 137 mEq/L (ref 135–145)

## 2012-08-02 MED ORDER — PRAVASTATIN SODIUM 20 MG PO TABS: 20.0000 mg | ORAL_TABLET | Freq: Every evening | ORAL | Status: DC

## 2012-08-02 NOTE — Patient Instructions (Addendum)
Your physician has requested that you have a cardiac catheterization. LEFT HEART CATH Tuesday, October 8 AT 11:30 AM Cardiac catheterization is used to diagnose and/or treat various heart conditions. Doctors may recommend this procedure for a number of different reasons. The most common reason is to evaluate chest pain. Chest pain can be a symptom of coronary artery disease (CAD), and cardiac catheterization can show whether plaque is narrowing or blocking your heart's arteries. This procedure is also used to evaluate the valves, as well as measure the blood flow and oxygen levels in different parts of your heart. For further information please visit [email or web address] Please follow instruction sheet, as given.   Your physician has recommended you make the following change in your medication:  1.) START PRAVASTATIN ONE TABLET ( 20 MG ) EVERY EVENING  2.) SENT THAT PRESCRIPTION IN YOUR PHARMACY   Your physician recommends that you HAVE LAB work TODAY: pt/inr/bmet/cbcw/diff   Your physician recommends that you return for a FASTING lipid profile: lfts/lipid in 6-8 WEEKS

## 2012-08-02 NOTE — Progress Notes (Signed)
[address] [place], [place]  [address] Phone: [phone] Fax:  [phone]  Date:  08/02/2012   Name:  [name]   DOB:  10/28/1955   MRN:  [id number]  PCP:  No primary provider on file.  Primary Cardiologist:  Dr. Peter Nishan  Primary Electrophysiologist:  None    History of Present Illness: [name] is a 56 y.o. male who returns for evaluation of chest pain.  Has a history of CAD, HTN, HL, COPD, OSA, and GERD.  Last seen by Dr. Peter Nishan 08/2011.  Over the last several mos, he has developed progressively worsening substernal chest pain with exertion with assoc dyspnea. Pain is described as burning.  It is relieved with rest.  No rest symptoms.  No symptoms with meals.  No assoc nausea, vomiting, diaphoresis.  No syncope, orthopnea, PND, LE edema.     Labs  (10/11):  K 4.4, creatinine 1.0, Hgb 15.6    Past Medical History  Diagnosis Date  . HYPERTRIGLYCERIDEMIA   . HYPERTENSION   . HYPERLIPIDEMIA   . HEMORRHOIDS   . CORONARY ARTERY DISEASE     a. s/p stent to RCA 2005;  b. Last LHC 12/07: Mid LAD 40%, proximal circumflex 20%, mid circumflex 95%, mid RCA stent okay, normal LV function. PCI: Taxus DES to the mCFX;  c.  Myoview 10/11: EF 56%, no ischemia or scar    . SLEEP APNEA   . OBESITY   . GERD   . DYSPHAGIA UNSPECIFIED   . DIVERTICULOSIS, COLON   . COPD   . COLONIC POLYPS, HX OF   . CHEST PAIN     Current Outpatient Prescriptions  Medication Sig Dispense Refill  . aspirin 325 MG tablet Take 325 mg by mouth daily.        . clopidogrel (PLAVIX) 75 MG tablet Take 1 tablet (75 mg total) by mouth daily.  30 tablet  12  . lisinopril (PRINIVIL,ZESTRIL) 10 MG tablet Take 1 tablet (10 mg total) by mouth daily.  30 tablet  12  . Magnesium 250 MG TABS Take 1 tablet by mouth daily.      . nitroGLYCERIN (NITROSTAT) 0.4 MG SL tablet Place 1 tablet (0.4 mg total) under the tongue every 5 (five) minutes as needed for chest pain.  25  tablet  12  . omeprazole (PRILOSEC OTC) 20 MG tablet Take 20 mg by mouth daily.      . Omega-3 Fatty Acids (FISH OIL) 1200 MG CAPS Take 1 capsule by mouth daily.        Allergies: No Known Allergies  History  Substance Use Topics  . Smoking status: Current Every Day Smoker -- 1.5 packs/day  . Smokeless tobacco: Not on file  . Alcohol Use: Not on file     Family History  Problem Relation Age of Onset  . Cancer Mother   . Heart disease Brother      ROS:  Please see the history of present illness.   Notes hx of dysphagia.  Has had esophageal dilatation in past.  No melena, hematochezia, hematuria, fevers, cough.   All other systems reviewed and negative.   PHYSICAL EXAM: VS:  BP 128/70  Pulse 75  Ht 5' 9" (1.753 m)  Wt 210 lb (95.255 kg)  BMI 31.01 kg/m2 Well nourished, well developed, in no acute distress HEENT: normal Neck: no JVD Vascular: no carotid bruits Cardiac:  normal S1, S2; RRR; no murmur Lungs:  clear to   auscultation bilaterally, no wheezing, rhonchi or rales Abd: soft, nontender, no hepatomegaly Ext: no edema Skin: warm and dry Neuro:  CNs 2-12 intact, no focal abnormalities noted  EKG:  NSR, HR 75, normal axis, nonspecific ST-T wave change      ASSESSMENT AND PLAN:  1. 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 This seems to be progressively getting worse over the last several months. Unfortunately, he continues to smoke. I have suggested proceeding with cardiac catheterization. I discussed this with Dr. Stuckey (DOD) who agrees.  Risks and benefits of cardiac catheterization have been discussed with the patient.  These include bleeding, infection, kidney damage, stroke, heart attack, death.  The patient understands these risks and is willing to proceed.   2. Coronary Artery Disease: Continue aspirin and Plavix. Proceed with cardiac catheterization as noted. We will make sure his nitroglycerin is up to date.  3. Hypertension:  Controlled.  4. Hyperlipidemia:  He stopped taking Zocor due to myalgias. Start pravastatin 20 mg each bedtime and check lipids and LFTs in 6-8 weeks.  5. Tobacco Abuse: He is not interested in quitting.  Signed, Peja Allender, PA-C  9:34 AM 08/02/2012    

## 2012-08-02 NOTE — H&P (Signed)
History and Physical  Date:  08/02/2012   Name:  [name]   DOB:  April 13, 1956   MRN:  [id number]  PCP:  No primary provider on file.  Primary Cardiologist:  Dr. Charlton Haws  Primary Electrophysiologist:  None    History of Present Illness: [name] is a 56 y.o. male who returns for evaluation of chest pain.  Has a history of CAD, HTN, HL, COPD, OSA, and GERD.  Last seen by Dr. Charlton Haws 08/2011.  Over the last several mos, he has developed progressively worsening substernal chest pain with exertion with assoc dyspnea. Pain is described as burning.  It is relieved with rest.  No rest symptoms.  No symptoms with meals.  No assoc nausea, vomiting, diaphoresis.  No syncope, orthopnea, PND, LE edema.     Labs  (10/11):  K 4.4, creatinine 1.0, Hgb 15.6    Past Medical History  Diagnosis Date  . HYPERTRIGLYCERIDEMIA   . HYPERTENSION   . HYPERLIPIDEMIA   . HEMORRHOIDS   . CORONARY ARTERY DISEASE     a. s/p stent to RCA 2005;  b. Last LHC 12/07: Mid LAD 40%, proximal circumflex 20%, mid circumflex 95%, mid RCA stent okay, normal LV function. PCI: Taxus DES to the [facility];  c.  Myoview 10/11: EF 56%, no ischemia or scar    . SLEEP APNEA   . OBESITY   . GERD   . DYSPHAGIA UNSPECIFIED   . DIVERTICULOSIS, COLON   . COPD   . COLONIC POLYPS, HX OF   . CHEST PAIN     Current Outpatient Prescriptions  Medication Sig Dispense Refill  . aspirin 325 MG tablet Take 325 mg by mouth daily.        . clopidogrel (PLAVIX) 75 MG tablet Take 1 tablet (75 mg total) by mouth daily.  30 tablet  12  . lisinopril (PRINIVIL,ZESTRIL) 10 MG tablet Take 1 tablet (10 mg total) by mouth daily.  30 tablet  12  . Magnesium 250 MG TABS Take 1 tablet by mouth daily.      . nitroGLYCERIN (NITROSTAT) 0.4 MG SL tablet Place 1 tablet (0.4 mg total) under the tongue every 5 (five) minutes as needed for chest pain.  25 tablet  12  . omeprazole (PRILOSEC OTC) 20 MG tablet Take 20 mg by mouth daily.      .  Omega-3 Fatty Acids (FISH OIL) 1200 MG CAPS Take 1 capsule by mouth daily.        Allergies: No Known Allergies  History  Substance Use Topics  . Smoking status: Current Every Day Smoker -- 1.5 packs/day  . Smokeless tobacco: Not on file  . Alcohol Use: Not on file     Family History  Problem Relation Age of Onset  . Cancer Mother   . Heart disease Brother      ROS:  Please see the history of present illness.   Notes hx of dysphagia.  Has had esophageal dilatation in past.  No melena, hematochezia, hematuria, fevers, cough.   All other systems reviewed and negative.   PHYSICAL EXAM: VS:  BP 128/70  Pulse 75  Ht 5\' 9"  (1.753 m)  Wt 210 lb (95.255 kg)  BMI 31.01 kg/m2 Well nourished, well developed, in no acute distress HEENT: normal Neck: no JVD Vascular: no carotid bruits Cardiac:  normal S1, S2; RRR; no murmur Lungs:  clear to auscultation bilaterally, no wheezing, rhonchi or rales Abd: soft, nontender, no hepatomegaly Ext: no  edema Skin: warm and dry Neuro:  CNs 2-12 intact, no focal abnormalities noted  EKG:  NSR, HR 75, normal axis, nonspecific ST-T wave change      ASSESSMENT AND PLAN:  1. 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 This seems to be progressively getting worse over the last several months. Unfortunately, he continues to smoke. I have suggested proceeding with cardiac catheterization. I discussed this with Dr. Riley Kill (DOD) who agrees.  Risks and benefits of cardiac catheterization have been discussed with the patient.  These include bleeding, infection, kidney damage, stroke, heart attack, death.  The patient understands these risks and is willing to proceed.   2. Coronary Artery Disease: Continue aspirin and Plavix. Proceed with cardiac catheterization as noted. We will make sure his nitroglycerin is up to date.  3. Hypertension: Controlled.  4. Hyperlipidemia:  He stopped taking Zocor due to myalgias. Start  pravastatin 20 mg each bedtime and check lipids and LFTs in 6-8 weeks.  5. Tobacco Abuse: He is not interested in quitting.  Luna Glasgow, PA-C  9:34 AM 08/02/2012

## 2012-08-02 NOTE — Telephone Encounter (Signed)
pt notified about lab results verbalized understanding today 

## 2012-08-02 NOTE — Telephone Encounter (Signed)
Message copied by Tarri Fuller on Mon Aug 02, 2012  2:52 PM ------      Message from: [place], [place] T      Created: Mon Aug 02, 2012  1:48 PM       [place], [place]  1:48 PM 08/02/2012

## 2012-08-03 ENCOUNTER — Encounter (HOSPITAL_BASED_OUTPATIENT_CLINIC_OR_DEPARTMENT_OTHER)
Admission: RE | Disposition: A | Discharge status: Home / Self Care | Payer: Self-pay | Source: Ambulatory Visit | Attending: Cardiology

## 2012-08-03 ENCOUNTER — Inpatient Hospital Stay (HOSPITAL_BASED_OUTPATIENT_CLINIC_OR_DEPARTMENT_OTHER)
Admission: RE | Admit: 2012-08-03 | Discharge: 2012-08-03 | Disposition: A | Discharge status: Home / Self Care | Payer: BC Managed Care – PPO | Source: Ambulatory Visit | Attending: Cardiology | Admitting: Cardiology

## 2012-08-03 DIAGNOSIS — J449 Chronic obstructive pulmonary disease, unspecified: Secondary | ICD-10-CM | POA: Insufficient documentation

## 2012-08-03 DIAGNOSIS — R079 Chest pain, unspecified: Secondary | ICD-10-CM

## 2012-08-03 DIAGNOSIS — E669 Obesity, unspecified: Secondary | ICD-10-CM | POA: Insufficient documentation

## 2012-08-03 DIAGNOSIS — E785 Hyperlipidemia, unspecified: Secondary | ICD-10-CM | POA: Insufficient documentation

## 2012-08-03 DIAGNOSIS — I2510 Atherosclerotic heart disease of native coronary artery without angina pectoris: Secondary | ICD-10-CM

## 2012-08-03 DIAGNOSIS — Z9861 Coronary angioplasty status: Secondary | ICD-10-CM | POA: Insufficient documentation

## 2012-08-03 DIAGNOSIS — G4730 Sleep apnea, unspecified: Secondary | ICD-10-CM | POA: Insufficient documentation

## 2012-08-03 DIAGNOSIS — J4489 Other specified chronic obstructive pulmonary disease: Secondary | ICD-10-CM | POA: Insufficient documentation

## 2012-08-03 DIAGNOSIS — I10 Essential (primary) hypertension: Secondary | ICD-10-CM | POA: Insufficient documentation

## 2012-08-03 SURGERY — JV LEFT HEART CATHETERIZATION WITH CORONARY ANGIOGRAM
Anesthesia: Moderate Sedation

## 2012-08-03 MED ORDER — ACETAMINOPHEN 325 MG PO TABS: 650.0000 mg | ORAL_TABLET | ORAL | Status: DC | PRN

## 2012-08-03 MED ORDER — SODIUM CHLORIDE 0.9 % IV SOLN
250.0000 mL | INTRAVENOUS | Status: DC | PRN
Start: 2012-08-03 — End: 2012-08-03

## 2012-08-03 MED ORDER — ONDANSETRON HCL 4 MG/2ML IJ SOLN: 4.0000 mg | Freq: Four times a day (QID) | INTRAMUSCULAR | Status: DC | PRN

## 2012-08-03 MED ORDER — SODIUM CHLORIDE 0.9 % IJ SOLN: 3.0000 mL | Freq: Two times a day (BID) | INTRAMUSCULAR | Status: DC

## 2012-08-03 MED ORDER — SODIUM CHLORIDE 0.9 % IJ SOLN: 3.0000 mL | INTRAMUSCULAR | Status: DC | PRN

## 2012-08-03 MED ORDER — ASPIRIN 81 MG PO CHEW: 324.0000 mg | CHEWABLE_TABLET | ORAL | Status: DC

## 2012-08-03 MED ORDER — SODIUM CHLORIDE 0.9 % IV SOLN: 1.0000 mL/kg/h | INTRAVENOUS | Status: DC

## 2012-08-03 MED ORDER — SODIUM CHLORIDE 0.9 % IV SOLN: INTRAVENOUS | Status: DC

## 2012-08-03 NOTE — Progress Notes (Signed)
Bedrest begins @ 1445.  Tegaderm dresssing applied to right groin by Army Melia RN, site level 0.

## 2012-08-03 NOTE — CV Procedure (Signed)
   Cardiac Catheterization Procedure Note  Name: [name] MRN: [id number] DOB: [birth year]  Procedure: Left Heart Cath, Selective Coronary Angiography, LV angiography  Indication: 56 year old white male status post stenting of the mid RCA in 2005 end of the mid left circumflex in 2007. He presents with symptoms of chest pain on exertion.   Procedural details: The right groin was prepped, draped, and anesthetized with 1% lidocaine. Using modified Seldinger technique, a 4 French sheath was introduced into the right femoral artery. Standard Judkins catheters were used for coronary angiography and left ventriculography. Catheter exchanges were performed over a guidewire. There were no immediate procedural complications. The patient was transferred to the post catheterization recovery area for further monitoring.  Procedural Findings: Hemodynamics:  AO 131/74 with a mean of 98 mmHg LV 138/23 mmHg   Coronary angiography: Coronary dominance: right  Left mainstem: Normal.  Left anterior descending (LAD): There is a true bifurcation stenosis in the proximal to mid LAD up to 90% involving the LAD before and after the diagonal takeoff. The diagonal appears to be spared of significant disease.  Left circumflex (LCx): The stent in the mid circumflex is widely patent. At the distal stent margin there is a 70-80% focal stenosis.  Right coronary artery (RCA): The right coronary is a dominant vessel. In the mid vessel there is a stent with diffuse in-stent disease up to 50%. At the distal stent margin this is approximately 60-70%.  Left ventriculography: Left ventricular systolic function is normal, LVEF is estimated at 55-65%, there is no significant mitral regurgitation   Final Conclusions:  1. Three-vessel obstructive coronary disease. His most critical lesion is in the LAD at the bifurcation.  2. Normal left ventricular function.  Recommendations: I will discuss the findings with his  primary cardiologist Dr. Eden Emms and discuss options with the patient for revascularization. Options would include percutaneous intervention of the LAD and mid circumflex versus coronary bypass surgery.  Theron Arista [facility] 08/03/2012, 2:44 PM

## 2012-08-03 NOTE — OR Nursing (Signed)
Discharge instructions reviewed and signed, pt stated understanding, ambulated in hall without difficulty, site level 0, transported to wife's car via wheelchair 

## 2012-08-03 NOTE — H&P (View-Only) (Signed)
[address] [place], [place]  [address] Phone: [phone] Fax:  [phone]  Date:  08/02/2012   Name:  [name]   DOB:  10-22-1956   MRN:  [id number]  PCP:  No primary provider on file.  Primary Cardiologist:  Dr. Charlton Haws  Primary Electrophysiologist:  None    History of Present Illness: [name] is a 56 y.o. male who returns for evaluation of chest pain.  Has a history of CAD, HTN, HL, COPD, OSA, and GERD.  Last seen by Dr. Charlton Haws 08/2011.  Over the last several mos, he has developed progressively worsening substernal chest pain with exertion with assoc dyspnea. Pain is described as burning.  It is relieved with rest.  No rest symptoms.  No symptoms with meals.  No assoc nausea, vomiting, diaphoresis.  No syncope, orthopnea, PND, LE edema.     Labs  (10/11):  K 4.4, creatinine 1.0, Hgb 15.6    Past Medical History  Diagnosis Date  . HYPERTRIGLYCERIDEMIA   . HYPERTENSION   . HYPERLIPIDEMIA   . HEMORRHOIDS   . CORONARY ARTERY DISEASE     a. s/p stent to RCA 2005;  b. Last LHC 12/07: Mid LAD 40%, proximal circumflex 20%, mid circumflex 95%, mid RCA stent okay, normal LV function. PCI: Taxus DES to the [facility];  c.  Myoview 10/11: EF 56%, no ischemia or scar    . SLEEP APNEA   . OBESITY   . GERD   . DYSPHAGIA UNSPECIFIED   . DIVERTICULOSIS, COLON   . COPD   . COLONIC POLYPS, HX OF   . CHEST PAIN     Current Outpatient Prescriptions  Medication Sig Dispense Refill  . aspirin 325 MG tablet Take 325 mg by mouth daily.        . clopidogrel (PLAVIX) 75 MG tablet Take 1 tablet (75 mg total) by mouth daily.  30 tablet  12  . lisinopril (PRINIVIL,ZESTRIL) 10 MG tablet Take 1 tablet (10 mg total) by mouth daily.  30 tablet  12  . Magnesium 250 MG TABS Take 1 tablet by mouth daily.      . nitroGLYCERIN (NITROSTAT) 0.4 MG SL tablet Place 1 tablet (0.4 mg total) under the tongue every 5 (five) minutes as needed for chest pain.  25  tablet  12  . omeprazole (PRILOSEC OTC) 20 MG tablet Take 20 mg by mouth daily.      . Omega-3 Fatty Acids (FISH OIL) 1200 MG CAPS Take 1 capsule by mouth daily.        Allergies: No Known Allergies  History  Substance Use Topics  . Smoking status: Current Every Day Smoker -- 1.5 packs/day  . Smokeless tobacco: Not on file  . Alcohol Use: Not on file     Family History  Problem Relation Age of Onset  . Cancer Mother   . Heart disease Brother      ROS:  Please see the history of present illness.   Notes hx of dysphagia.  Has had esophageal dilatation in past.  No melena, hematochezia, hematuria, fevers, cough.   All other systems reviewed and negative.   PHYSICAL EXAM: VS:  BP 128/70  Pulse 75  Ht 5\' 9"  (1.753 m)  Wt 210 lb (95.255 kg)  BMI 31.01 kg/m2 Well nourished, well developed, in no acute distress HEENT: normal Neck: no JVD Vascular: no carotid bruits Cardiac:  normal S1, S2; RRR; no murmur Lungs:  clear to  auscultation bilaterally, no wheezing, rhonchi or rales Abd: soft, nontender, no hepatomegaly Ext: no edema Skin: warm and dry Neuro:  CNs 2-12 intact, no focal abnormalities noted  EKG:  NSR, HR 75, normal axis, nonspecific ST-T wave change      ASSESSMENT AND PLAN:  1. 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 This seems to be progressively getting worse over the last several months. Unfortunately, he continues to smoke. I have suggested proceeding with cardiac catheterization. I discussed this with Dr. Riley Kill (DOD) who agrees.  Risks and benefits of cardiac catheterization have been discussed with the patient.  These include bleeding, infection, kidney damage, stroke, heart attack, death.  The patient understands these risks and is willing to proceed.   2. Coronary Artery Disease: Continue aspirin and Plavix. Proceed with cardiac catheterization as noted. We will make sure his nitroglycerin is up to date.  3. Hypertension:  Controlled.  4. Hyperlipidemia:  He stopped taking Zocor due to myalgias. Start pravastatin 20 mg each bedtime and check lipids and LFTs in 6-8 weeks.  5. Tobacco Abuse: He is not interested in quitting.  Luna Glasgow, PA-C  9:34 AM 08/02/2012

## 2012-08-03 NOTE — Interval H&P Note (Signed)
History and Physical Interval Note:  08/03/2012 2:05 PM  [name]  has presented today for surgery, with the diagnosis of CP  The various methods of treatment have been discussed with the patient and family. After consideration of risks, benefits and other options for treatment, the patient has consented to  Procedure(s) (LRB) with comments: JV LEFT HEART CATHETERIZATION WITH CORONARY ANGIOGRAM (N/A) as a surgical intervention .  The patient's history has been reviewed, patient examined, no change in status, stable for surgery.  I have reviewed the patient's chart and labs.  Questions were answered to the patient's satisfaction.     Theron Arista [facility] 08/03/2012 2:05 PM

## 2012-08-04 ENCOUNTER — Encounter: Payer: Self-pay | Admitting: Surgery

## 2012-08-04 ENCOUNTER — Other Ambulatory Visit: Payer: Self-pay | Admitting: *Deleted

## 2012-08-04 ENCOUNTER — Institutional Professional Consult (permissible substitution) (INDEPENDENT_AMBULATORY_CARE_PROVIDER_SITE_OTHER): Payer: BC Managed Care – PPO | Admitting: Surgery

## 2012-08-04 ENCOUNTER — Encounter (HOSPITAL_BASED_OUTPATIENT_CLINIC_OR_DEPARTMENT_OTHER): Payer: Self-pay

## 2012-08-04 VITALS — BP 134/78 | HR 82 | Resp 18 | Ht 69.0 in | Wt 213.0 lb

## 2012-08-04 DIAGNOSIS — I2510 Atherosclerotic heart disease of native coronary artery without angina pectoris: Secondary | ICD-10-CM

## 2012-08-05 ENCOUNTER — Encounter: Payer: Self-pay | Admitting: Surgery

## 2012-08-05 ENCOUNTER — Telehealth: Payer: Self-pay | Admitting: Cardiovascular Disease

## 2012-08-05 ENCOUNTER — Encounter (HOSPITAL_COMMUNITY): Payer: Self-pay | Admitting: Pharmacy Technician

## 2012-08-05 NOTE — Telephone Encounter (Signed)
Pt was told by dr Eden Emms to call after he has seen his surgeon, has done this and would like a call

## 2012-08-05 NOTE — Progress Notes (Signed)
[address]            [place] [address]          [phone]      PCP is Pcp Not In System Referring Provider is Charlton Haws, MD  Chief Complaint  Patient presents with  . Coronary Artery Disease    Referral from Dr Eden Emms for surgical eval on CAD, cardiac cath on 08/03/12    HPI:  The patient is a 56 year old current heavy smoker with a history of hypertension, hyperlipidemia, obstructive sleep apnea, and COPD who has a history of coronary disease status post stenting of the mid right coronary artery in 2005 and stenting of the mid left circumflex in 2007. He now presents with a few months history of exertional substernal chest discomfort associated with shortness of breath. He has had no symptoms at rest.  Past Medical History  Diagnosis Date  . HYPERTRIGLYCERIDEMIA   . HYPERTENSION   . HYPERLIPIDEMIA   . HEMORRHOIDS   . CORONARY ARTERY DISEASE     a. s/p stent to RCA 2005;  b. Last LHC 12/07: Mid LAD 40%, proximal circumflex 20%, mid circumflex 95%, mid RCA stent okay, normal LV function. PCI: Taxus DES to the [facility];  c.  Myoview 10/11: EF 56%, no ischemia or scar    . SLEEP APNEA   . OBESITY   . GERD   . DYSPHAGIA UNSPECIFIED   . DIVERTICULOSIS, COLON   . COPD   . COLONIC POLYPS, HX OF   . CHEST PAIN     Past Surgical History  Procedure Date  . Cardiac catheterization   . Right long finger extensor tendon repair    Back surgery for lumbar disc disease in past. No current problems  Family History  Problem Relation Age of Onset  . Cancer Mother   . Heart disease Brother     Social History History  Substance Use Topics  . Smoking status: Current Every Day Smoker -- 1.5 packs/day  . Smokeless tobacco: Not on file  . Alcohol Use: Not on file    Current Outpatient Prescriptions  Medication Sig Dispense Refill  . aspirin 325 MG tablet Take 325 mg by mouth daily.        . clopidogrel (PLAVIX) 75 MG tablet Take 1 tablet  (75 mg total) by mouth daily.  30 tablet  12  . lisinopril (PRINIVIL,ZESTRIL) 10 MG tablet Take 1 tablet (10 mg total) by mouth daily.  30 tablet  12  . Magnesium 250 MG TABS Take 1 tablet by mouth daily.      . nitroGLYCERIN (NITROSTAT) 0.4 MG SL tablet Place 1 tablet (0.4 mg total) under the tongue every 5 (five) minutes as needed for chest pain.  25 tablet  12  . Omega-3 Fatty Acids (FISH OIL) 1200 MG CAPS Take 1 capsule by mouth daily.      [name] omeprazole (PRILOSEC OTC) 20 MG tablet Take 20 mg by mouth daily.      . pravastatin (PRAVACHOL) 20 MG tablet Take 1 tablet (20 mg total) by mouth every evening.  30 tablet  9    No Known Allergies  Review of Systems  Constitutional: Positive for fatigue. Negative for fever, diaphoresis, activity change, appetite change and unexpected weight change.  HENT: Negative.   Eyes: Negative.   Respiratory: Positive for shortness of breath.  With exertion.   Sleep apnea, uses CPAP   Cardiovascular: Positive for chest pain. Negative for palpitations and leg swelling.  Gastrointestinal:       Reflux  Genitourinary: Negative.   Musculoskeletal: Negative.   Neurological: Negative.   Hematological: Bruises/bleeds easily.       On Plavix   Psychiatric/Behavioral: Negative.     BP 134/78  Pulse 82  Resp 18  Ht 5\' 9"  (1.753 m)  Wt 213 lb (96.616 kg)  BMI 31.45 kg/m2  SpO2 96% Physical Exam  Constitutional: He is oriented to person, place, and time. He appears well-developed and well-nourished. No distress.  HENT:  Head: Normocephalic and atraumatic.  Mouth/Throat: Oropharynx is clear and moist. No oropharyngeal exudate.  Eyes: Conjunctivae normal and EOM are normal. Pupils are equal, round, and reactive to light.  Neck: Normal range of motion. Neck supple. No JVD present. No thyromegaly present.  Cardiovascular: Normal rate, regular rhythm, normal heart sounds and intact distal pulses.  Exam reveals no gallop and no friction rub.   No  murmur heard. Pulmonary/Chest: Effort normal and breath sounds normal. No respiratory distress. He has no wheezes. He has no rales.  Abdominal: Soft. Bowel sounds are normal. He exhibits no distension and no mass. There is no tenderness.  Musculoskeletal: Normal range of motion. He exhibits no edema.  Lymphadenopathy:    He has no cervical adenopathy.  Neurological: He is alert and oriented to person, place, and time. He has normal strength. No cranial nerve deficit or sensory deficit.  Skin: Skin is warm and dry.  Psychiatric: He has a normal mood and affect.     Diagnostic Tests:  Cardiac Cath:  Procedural Findings: Hemodynamics:  AO 131/74 with a mean of 98 mmHg LV 138/23 mmHg              Coronary angiography: Coronary dominance: right  Left mainstem: Normal.  Left anterior descending (LAD): There is a true bifurcation stenosis in the proximal to mid LAD up to 90% involving the LAD before and after the diagonal takeoff. The diagonal appears to be spared of significant disease.  Left circumflex (LCx): The stent in the mid circumflex is widely patent. At the distal stent margin there is a 70-80% focal stenosis.  Right coronary artery (RCA): The right coronary is a dominant vessel. In the mid vessel there is a stent with diffuse in-stent disease up to 50%. At the distal stent margin this is approximately 60-70%.  Left ventriculography: Left ventricular systolic function is normal, LVEF is estimated at 55-65%, there is no significant mitral regurgitation   Final Conclusions:  1. Three-vessel obstructive coronary disease. His most critical lesion is in the LAD at the bifurcation.  2. Normal left ventricular function.   Impression/Plan:  He has severe three-vessel coronary disease with exertional angina that is lifestyle limiting. I agree that coronary bypass graft surgery is the best treatment to prevent further ischemia and infarction and improve his quality of life and  activity level. I discussed the importance of cardiac risk factor reduction including complete smoking cessation. I think he and his wife understand the importance of this. I discussed the operative procedure with the patient and family including alternatives, benefits and risks; including but not limited to bleeding, blood transfusion, infection, stroke, myocardial infarction, graft failure, heart block requiring a permanent pacemaker, organ dysfunction, and death.  Carleene Overlie understands and agrees to proceed.  We will schedule surgery for Wednesday, 08/18/2012 at the patient's request. He says  that he and wife have plans for the weekend before that. 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.   All

## 2012-08-06 NOTE — Telephone Encounter (Signed)
SPOKE WITH  PT  HAS SEEN DR Laneta Simmers  AND IS SCHEDULED  FOR BYPASS  SURGERY ON 08-18-12 WILL FORWARD TO DR Eden Emms FOR REVIEW .[name]

## 2012-08-08 NOTE — H&P (Signed)
I have seen the patient with Mr. [name], and arrangements have been made for cardiac cath in the OP lab.  I have personally reviewed and examined the patient and concur with the findings and recommendations.  TS

## 2012-08-16 ENCOUNTER — Ambulatory Visit (HOSPITAL_COMMUNITY)
Admission: RE | Admit: 2012-08-16 | Discharge: 2012-08-16 | Disposition: A | Discharge status: Home / Self Care | Payer: BC Managed Care – PPO | Source: Ambulatory Visit | Attending: Surgery | Admitting: Surgery

## 2012-08-16 ENCOUNTER — Encounter (HOSPITAL_COMMUNITY)
Admission: RE | Admit: 2012-08-16 | Discharge: 2012-08-16 | Disposition: A | Discharge status: Home / Self Care | Payer: BC Managed Care – PPO | Source: Ambulatory Visit | Attending: Surgery | Admitting: Surgery

## 2012-08-16 ENCOUNTER — Encounter (HOSPITAL_COMMUNITY): Payer: Self-pay

## 2012-08-16 VITALS — BP 127/78 | HR 69 | Temp 98.7°F | Resp 20 | Ht 69.0 in | Wt 209.8 lb

## 2012-08-16 DIAGNOSIS — I2510 Atherosclerotic heart disease of native coronary artery without angina pectoris: Secondary | ICD-10-CM

## 2012-08-16 DIAGNOSIS — Z01810 Encounter for preprocedural cardiovascular examination: Secondary | ICD-10-CM | POA: Insufficient documentation

## 2012-08-16 DIAGNOSIS — R0602 Shortness of breath: Secondary | ICD-10-CM | POA: Insufficient documentation

## 2012-08-16 DIAGNOSIS — Z01812 Encounter for preprocedural laboratory examination: Secondary | ICD-10-CM | POA: Insufficient documentation

## 2012-08-16 DIAGNOSIS — Z01818 Encounter for other preprocedural examination: Secondary | ICD-10-CM | POA: Insufficient documentation

## 2012-08-16 LAB — COMPREHENSIVE METABOLIC PANEL
BUN: 15 mg/dL (ref 6–23)
CO2: 23 mEq/L (ref 19–32)
Calcium: 9.3 mg/dL (ref 8.4–10.5)
Creatinine, Ser: 1.03 mg/dL (ref 0.50–1.35)
GFR calc Af Amer: >90 mL/min (ref >90)
GFR calc non Af Amer: 78 mL/min — ABNORMAL LOW (ref >90)
Glucose, Bld: 103 mg/dL — ABNORMAL HIGH (ref 70–99)

## 2012-08-16 LAB — URINALYSIS, ROUTINE W REFLEX MICROSCOPIC
Bilirubin Urine: NEGATIVE
Hgb urine dipstick: NEGATIVE
Nitrite: NEGATIVE
Specific Gravity, Urine: 1.018 (ref 1.005–1.030)
pH: 6 (ref 5.0–8.0)

## 2012-08-16 LAB — BLOOD GAS, ARTERIAL
Acid-base deficit: 0.2 mmol/L (ref 0.0–2.0)
Bicarbonate: 23.6 mEq/L (ref 20.0–24.0)
Drawn by: 206361
FIO2: 0.21 %
O2 Saturation: 98.1 %
pO2, Arterial: 96.4 mmHg (ref 80.0–100.0)

## 2012-08-16 LAB — CBC
HCT: 43.1 % (ref 39.0–52.0)
Hemoglobin: 14.8 g/dL (ref 13.0–17.0)
MCH: 31.1 pg (ref 26.0–34.0)
MCV: 90.5 fL (ref 78.0–100.0)
RBC: 4.76 MIL/uL (ref 4.22–5.81)

## 2012-08-16 LAB — ABO/RH: ABO/RH(D): A POS

## 2012-08-16 LAB — PROTIME-INR: Prothrombin Time: 13.2 seconds (ref 11.6–15.2)

## 2012-08-16 LAB — PULMONARY FUNCTION TEST

## 2012-08-16 LAB — SURGICAL PCR SCREEN: MRSA, PCR: NEGATIVE

## 2012-08-16 MED ORDER — ALBUTEROL SULFATE (5 MG/ML) 0.5% IN NEBU
2.5000 mg | INHALATION_SOLUTION | Freq: Once | RESPIRATORY_TRACT | Status: AC
  Administered 2012-08-16: 2.5 mg via RESPIRATORY_TRACT

## 2012-08-16 NOTE — Pre-Procedure Instructions (Signed)
20 [name]  08/16/2012   Your procedure is scheduled on:  October 23  Report to [facility] at 06:30 AM.  Call this number if you have problems the morning of surgery: [phone]   Remember:   Do not eat or drink:After Midnight.  Take these medicines the morning of surgery with A SIP OF WATER: Omeprazole     STOP Aspirin   Do not wear jewelry, make-up or nail polish.  Do not wear lotions, powders, or perfumes. You may wear deodorant.  Do not shave 48 hours prior to surgery. Men may shave face and neck.  Do not bring valuables to the hospital.  Contacts, dentures or bridgework may not be worn into surgery.  Leave suitcase in the car. After surgery it may be brought to your room.  For patients admitted to the hospital, checkout time is 11:00 AM the day of discharge.   Special Instructions: Incentive Spirometry - Practice and bring it with you on the day of surgery. Shower using CHG 2 nights before surgery and the night before surgery.  If you shower the day of surgery use CHG.  Use special wash - you have one bottle of CHG for all showers.  You should use approximately 1/3 of the bottle for each shower.   Please read over the following fact sheets that you were given: Pain Booklet, Coughing and Deep Breathing, Blood Transfusion Information, Open Heart Packet and Surgical Site Infection Prevention

## 2012-08-16 NOTE — Progress Notes (Signed)
Pre-op Cardiac Surgery  Carotid Findings:  No ICA stenosis.  Vertebral artery flow antegrade.  Upper Extremity Right Left  Brachial Pressures 150T 148T  Radial Waveforms T T  Ulnar Waveforms T T  Palmar Arch (Allen's Test) WNL Doppler signal remains normal with radial compression and obliterates with ulnar compression   Findings:      Lower  Extremity Right Left  Dorsalis Pedis    Anterior Tibial    Posterior Tibial    Ankle/Brachial Indices      Findings:  palpable

## 2012-08-16 NOTE — Progress Notes (Signed)
Paged [name] to see patient. Patient reports having PFT's and Doppler studies done this am.

## 2012-08-17 LAB — HEMOGLOBIN A1C: Mean Plasma Glucose: 111 mg/dL (ref <117)

## 2012-08-17 MED ORDER — SODIUM CHLORIDE 0.9 % IV SOLN
INTRAVENOUS | Status: AC
  Administered 2012-08-18: 69.8 mL/h via INTRAVENOUS
  Filled 2012-08-17: qty 40

## 2012-08-17 MED ORDER — EPINEPHRINE HCL 1 MG/ML IJ SOLN
0.5000 ug/min | INTRAVENOUS | Status: DC
  Filled 2012-08-17: qty 4

## 2012-08-17 MED ORDER — PLASMA-LYTE 148 IV SOLN
INTRAVENOUS | Status: AC
  Administered 2012-08-18: 10:00:00
  Filled 2012-08-17: qty 2.5

## 2012-08-17 MED ORDER — MAGNESIUM SULFATE 50 % IJ SOLN
40.0000 meq | INTRAMUSCULAR | Status: DC
  Filled 2012-08-17: qty 10

## 2012-08-17 MED ORDER — DOPAMINE-DEXTROSE 3.2-5 MG/ML-% IV SOLN: 2.0000 ug/kg/min | INTRAVENOUS | Status: DC

## 2012-08-17 MED ORDER — POTASSIUM CHLORIDE 2 MEQ/ML IV SOLN
80.0000 meq | INTRAVENOUS | Status: DC
  Filled 2012-08-17 (×2): qty 40

## 2012-08-17 MED ORDER — DOPAMINE-DEXTROSE 3.2-5 MG/ML-% IV SOLN
2.0000 ug/kg/min | INTRAVENOUS | Status: DC
  Filled 2012-08-17: qty 250

## 2012-08-17 MED ORDER — SODIUM CHLORIDE 0.9 % IV SOLN
INTRAVENOUS | Status: AC
  Administered 2012-08-18: 1.3 [IU]/h via INTRAVENOUS
  Filled 2012-08-17: qty 1

## 2012-08-17 MED ORDER — NITROGLYCERIN IN D5W 200-5 MCG/ML-% IV SOLN
2.0000 ug/min | INTRAVENOUS | Status: AC
  Administered 2012-08-18: 5 ug/min via INTRAVENOUS
  Filled 2012-08-17 (×2): qty 250

## 2012-08-17 MED ORDER — VANCOMYCIN HCL 1000 MG IV SOLR
1500.0000 mg | INTRAVENOUS | Status: AC
  Administered 2012-08-18: 1500 mg via INTRAVENOUS
  Filled 2012-08-17: qty 1500

## 2012-08-17 MED ORDER — DEXMEDETOMIDINE HCL IN NACL 400 MCG/100ML IV SOLN: 0.1000 ug/kg/h | INTRAVENOUS | Status: DC

## 2012-08-17 MED ORDER — DEXTROSE 5 % IV SOLN
750.0000 mg | INTRAVENOUS | Status: DC
  Filled 2012-08-17: qty 750

## 2012-08-17 MED ORDER — PHENYLEPHRINE HCL 10 MG/ML IJ SOLN
30.0000 ug/min | INTRAVENOUS | Status: AC
  Administered 2012-08-18: 25 ug/min via INTRAVENOUS
  Filled 2012-08-17: qty 2

## 2012-08-17 MED ORDER — CHLORHEXIDINE GLUCONATE 4 % EX LIQD: 30.0000 mL | CUTANEOUS | Status: DC

## 2012-08-17 MED ORDER — DEXTROSE 5 % IV SOLN
1.5000 g | INTRAVENOUS | Status: AC
  Administered 2012-08-18: 1.5 g via INTRAVENOUS
  Administered 2012-08-18: .75 g via INTRAVENOUS
  Filled 2012-08-17: qty 1.5

## 2012-08-17 MED ORDER — DEXMEDETOMIDINE HCL IN NACL 400 MCG/100ML IV SOLN
0.1000 ug/kg/h | INTRAVENOUS | Status: AC
  Administered 2012-08-18: 0.3 ug/kg/h via INTRAVENOUS
  Filled 2012-08-17: qty 100

## 2012-08-18 ENCOUNTER — Encounter (HOSPITAL_COMMUNITY)
Admission: RE | Disposition: A | Discharge status: Home / Self Care | Payer: Self-pay | Source: Ambulatory Visit | Attending: Surgery

## 2012-08-18 ENCOUNTER — Encounter (HOSPITAL_COMMUNITY): Payer: Self-pay | Admitting: *Deleted

## 2012-08-18 ENCOUNTER — Inpatient Hospital Stay (HOSPITAL_COMMUNITY): Payer: BC Managed Care – PPO

## 2012-08-18 ENCOUNTER — Encounter (HOSPITAL_COMMUNITY): Payer: Self-pay | Admitting: Anesthesiology

## 2012-08-18 ENCOUNTER — Inpatient Hospital Stay (HOSPITAL_COMMUNITY)
Admission: RE | Admit: 2012-08-18 | Discharge: 2012-08-23 | DRG: 109 | Disposition: A | Discharge status: Home / Self Care | Payer: BC Managed Care – PPO | Source: Ambulatory Visit | Attending: Surgery | Admitting: Surgery

## 2012-08-18 ENCOUNTER — Ambulatory Visit (HOSPITAL_COMMUNITY): Payer: BC Managed Care – PPO | Admitting: Anesthesiology

## 2012-08-18 DIAGNOSIS — J449 Chronic obstructive pulmonary disease, unspecified: Secondary | ICD-10-CM | POA: Diagnosis present

## 2012-08-18 DIAGNOSIS — K649 Unspecified hemorrhoids: Secondary | ICD-10-CM | POA: Diagnosis present

## 2012-08-18 DIAGNOSIS — Z7982 Long term (current) use of aspirin: Secondary | ICD-10-CM

## 2012-08-18 DIAGNOSIS — I10 Essential (primary) hypertension: Secondary | ICD-10-CM | POA: Diagnosis present

## 2012-08-18 DIAGNOSIS — D62 Acute posthemorrhagic anemia: Secondary | ICD-10-CM | POA: Diagnosis not present

## 2012-08-18 DIAGNOSIS — E785 Hyperlipidemia, unspecified: Secondary | ICD-10-CM | POA: Diagnosis present

## 2012-08-18 DIAGNOSIS — K219 Gastro-esophageal reflux disease without esophagitis: Secondary | ICD-10-CM | POA: Diagnosis present

## 2012-08-18 DIAGNOSIS — J4489 Other specified chronic obstructive pulmonary disease: Secondary | ICD-10-CM | POA: Diagnosis present

## 2012-08-18 DIAGNOSIS — Z23 Encounter for immunization: Secondary | ICD-10-CM

## 2012-08-18 DIAGNOSIS — E669 Obesity, unspecified: Secondary | ICD-10-CM | POA: Diagnosis present

## 2012-08-18 DIAGNOSIS — I4892 Unspecified atrial flutter: Secondary | ICD-10-CM | POA: Diagnosis not present

## 2012-08-18 DIAGNOSIS — I2510 Atherosclerotic heart disease of native coronary artery without angina pectoris: Principal | ICD-10-CM

## 2012-08-18 DIAGNOSIS — G4733 Obstructive sleep apnea (adult) (pediatric): Secondary | ICD-10-CM | POA: Diagnosis present

## 2012-08-18 DIAGNOSIS — Z79899 Other long term (current) drug therapy: Secondary | ICD-10-CM

## 2012-08-18 DIAGNOSIS — Z860100 Personal history of colon polyps, unspecified: Secondary | ICD-10-CM

## 2012-08-18 DIAGNOSIS — Z6831 Body mass index (BMI) 31.0-31.9, adult: Secondary | ICD-10-CM

## 2012-08-18 DIAGNOSIS — K5730 Diverticulosis of large intestine without perforation or abscess without bleeding: Secondary | ICD-10-CM | POA: Diagnosis present

## 2012-08-18 DIAGNOSIS — F17200 Nicotine dependence, unspecified, uncomplicated: Secondary | ICD-10-CM | POA: Diagnosis present

## 2012-08-18 HISTORY — PX: CORONARY ARTERY BYPASS GRAFT: SHX141

## 2012-08-18 LAB — POCT I-STAT 4, (NA,K, GLUC, HGB,HCT)
Glucose, Bld: 94 mg/dL (ref 70–99)
Glucose, Bld: 118 mg/dL — ABNORMAL HIGH (ref 70–99)
Glucose, Bld: 111 mg/dL — ABNORMAL HIGH (ref 70–99)
HCT: 41 % (ref 39.0–52.0)
HCT: 30 % — ABNORMAL LOW (ref 39.0–52.0)
HCT: 30 % — ABNORMAL LOW (ref 39.0–52.0)
HCT: 30 % — ABNORMAL LOW (ref 39.0–52.0)
Hemoglobin: 13.9 g/dL (ref 13.0–17.0)
Hemoglobin: 10.2 g/dL — ABNORMAL LOW (ref 13.0–17.0)
Hemoglobin: 10.2 g/dL — ABNORMAL LOW (ref 13.0–17.0)
Potassium: 4.1 mEq/L (ref 3.5–5.1)
Potassium: 4.4 mEq/L (ref 3.5–5.1)
Potassium: 4.7 mEq/L (ref 3.5–5.1)
Potassium: 4.6 mEq/L (ref 3.5–5.1)
Sodium: 140 mEq/L (ref 135–145)
Sodium: 138 mEq/L (ref 135–145)
Sodium: 129 mEq/L — ABNORMAL LOW (ref 135–145)
Sodium: 137 mEq/L (ref 135–145)

## 2012-08-18 LAB — POCT I-STAT 3, ART BLOOD GAS (G3+)
Acid-Base Excess: 1 mmol/L (ref 0.0–2.0)
Acid-base deficit: 3 mmol/L — ABNORMAL HIGH (ref 0.0–2.0)
Acid-base deficit: 3 mmol/L — ABNORMAL HIGH (ref 0.0–2.0)
Bicarbonate: 22.8 mEq/L (ref 20.0–24.0)
Bicarbonate: 25.1 mEq/L — ABNORMAL HIGH (ref 20.0–24.0)
O2 Saturation: 94 %
Patient temperature: 36.1
Patient temperature: 36.8
Patient temperature: 36.5
TCO2: 26 mmol/L (ref 0–100)
TCO2: 24 mmol/L (ref 0–100)
pCO2 arterial: 47.2 mmHg — ABNORMAL HIGH (ref 35.0–45.0)
pH, Arterial: 7.32 — ABNORMAL LOW (ref 7.350–7.450)
pH, Arterial: 7.356 (ref 7.350–7.450)
pH, Arterial: 7.341 — ABNORMAL LOW (ref 7.350–7.450)
pH, Arterial: 7.37 (ref 7.350–7.450)
pO2, Arterial: 322 mmHg — ABNORMAL HIGH (ref 80.0–100.0)
pO2, Arterial: 103 mmHg — ABNORMAL HIGH (ref 80.0–100.0)

## 2012-08-18 LAB — CBC
HCT: 35.5 % — ABNORMAL LOW (ref 39.0–52.0)
HCT: 33.5 % — ABNORMAL LOW (ref 39.0–52.0)
Hemoglobin: 12.5 g/dL — ABNORMAL LOW (ref 13.0–17.0)
Hemoglobin: 11.7 g/dL — ABNORMAL LOW (ref 13.0–17.0)
MCV: 90.8 fL (ref 78.0–100.0)
RBC: 3.75 MIL/uL — ABNORMAL LOW (ref 4.22–5.81)
WBC: 14.2 10*3/uL — ABNORMAL HIGH (ref 4.0–10.5)
WBC: 14.3 10*3/uL — ABNORMAL HIGH (ref 4.0–10.5)

## 2012-08-18 LAB — POCT I-STAT, CHEM 8
Calcium, Ion: 1.17 mmol/L (ref 1.12–1.23)
Chloride: 106 mEq/L (ref 96–112)
HCT: 33 % — ABNORMAL LOW (ref 39.0–52.0)
Hemoglobin: 11.2 g/dL — ABNORMAL LOW (ref 13.0–17.0)

## 2012-08-18 LAB — CREATININE, SERUM: GFR calc Af Amer: >90 mL/min (ref >90)

## 2012-08-18 LAB — HEMOGLOBIN AND HEMATOCRIT, BLOOD
HCT: 31 % — ABNORMAL LOW (ref 39.0–52.0)
Hemoglobin: 10.9 g/dL — ABNORMAL LOW (ref 13.0–17.0)

## 2012-08-18 LAB — POCT I-STAT GLUCOSE: Glucose, Bld: 102 mg/dL — ABNORMAL HIGH (ref 70–99)

## 2012-08-18 LAB — GLUCOSE, CAPILLARY
Glucose-Capillary: 103 mg/dL — ABNORMAL HIGH (ref 70–99)
Glucose-Capillary: 153 mg/dL — ABNORMAL HIGH (ref 70–99)

## 2012-08-18 LAB — APTT: aPTT: 34 seconds (ref 24–37)

## 2012-08-18 LAB — PROTIME-INR: INR: 1.28 (ref 0.00–1.49)

## 2012-08-18 SURGERY — CORONARY ARTERY BYPASS GRAFTING (CABG)
Anesthesia: General | Site: Chest | Wound class: Clean

## 2012-08-18 MED ORDER — PROTAMINE SULFATE 10 MG/ML IV SOLN
INTRAVENOUS | Status: DC | PRN
  Administered 2012-08-18 (×2): 80 mg via INTRAVENOUS

## 2012-08-18 MED ORDER — METOPROLOL TARTRATE 12.5 MG HALF TABLET
12.5000 mg | ORAL_TABLET | Freq: Once | ORAL | Status: AC
  Administered 2012-08-18: 12.5 mg via ORAL

## 2012-08-18 MED ORDER — BISACODYL 5 MG PO TBEC
10.0000 mg | DELAYED_RELEASE_TABLET | Freq: Every day | ORAL | Status: DC
  Administered 2012-08-19: 10 mg via ORAL
  Filled 2012-08-18: qty 2

## 2012-08-18 MED ORDER — DEXTROSE 5 % IV SOLN
1.5000 g | Freq: Two times a day (BID) | INTRAVENOUS | Status: DC
  Administered 2012-08-18 – 2012-08-19 (×2): 1.5 g via INTRAVENOUS
  Filled 2012-08-18 (×4): qty 1.5

## 2012-08-18 MED ORDER — METOPROLOL TARTRATE 12.5 MG HALF TABLET
ORAL_TABLET | ORAL | Status: AC
  Administered 2012-08-18: 12.5 mg via ORAL
  Filled 2012-08-18: qty 1

## 2012-08-18 MED ORDER — SODIUM CHLORIDE 0.9 % IJ SOLN: 3.0000 mL | INTRAMUSCULAR | Status: DC | PRN

## 2012-08-18 MED ORDER — SODIUM CHLORIDE 0.9 % IV SOLN
INTRAVENOUS | Status: DC
  Filled 2012-08-18: qty 40

## 2012-08-18 MED ORDER — PHENYLEPHRINE HCL 10 MG/ML IJ SOLN
0.0000 ug/min | INTRAVENOUS | Status: DC
  Filled 2012-08-18: qty 2

## 2012-08-18 MED ORDER — ACETAMINOPHEN 160 MG/5ML PO SOLN
975.0000 mg | Freq: Four times a day (QID) | ORAL | Status: DC
  Filled 2012-08-18: qty 40.6

## 2012-08-18 MED ORDER — SODIUM CHLORIDE 0.9 % IJ SOLN: 3.0000 mL | Freq: Two times a day (BID) | INTRAMUSCULAR | Status: DC

## 2012-08-18 MED ORDER — LACTATED RINGERS IV SOLN
INTRAVENOUS | Status: DC | PRN
  Administered 2012-08-18: 08:00:00 via INTRAVENOUS

## 2012-08-18 MED ORDER — SODIUM CHLORIDE 0.45 % IV SOLN: INTRAVENOUS | Status: DC

## 2012-08-18 MED ORDER — LACTATED RINGERS IV SOLN: INTRAVENOUS | Status: DC

## 2012-08-18 MED ORDER — 0.9 % SODIUM CHLORIDE (POUR BTL) OPTIME
TOPICAL | Status: DC | PRN
  Administered 2012-08-18: 6000 mL

## 2012-08-18 MED ORDER — LACTATED RINGERS IV SOLN: 500.0000 mL | Freq: Once | INTRAVENOUS | Status: AC | PRN

## 2012-08-18 MED ORDER — BISACODYL 10 MG RE SUPP: 10.0000 mg | Freq: Every day | RECTAL | Status: DC

## 2012-08-18 MED ORDER — ACETAMINOPHEN 10 MG/ML IV SOLN
1000.0000 mg | Freq: Once | INTRAVENOUS | Status: AC
  Administered 2012-08-18: 1000 mg via INTRAVENOUS
  Filled 2012-08-18: qty 100

## 2012-08-18 MED ORDER — ROCURONIUM BROMIDE 100 MG/10ML IV SOLN
INTRAVENOUS | Status: DC | PRN
  Administered 2012-08-18: 50 mg via INTRAVENOUS

## 2012-08-18 MED ORDER — MORPHINE SULFATE 2 MG/ML IJ SOLN: 1.0000 mg | INTRAMUSCULAR | Status: DC | PRN

## 2012-08-18 MED ORDER — MORPHINE SULFATE 2 MG/ML IJ SOLN
2.0000 mg | INTRAMUSCULAR | Status: DC | PRN
  Administered 2012-08-18 – 2012-08-19 (×4): 2 mg via INTRAVENOUS
  Administered 2012-08-19: 4 mg via INTRAVENOUS
  Filled 2012-08-18 (×2): qty 1
  Filled 2012-08-18: qty 2
  Filled 2012-08-18 (×2): qty 1

## 2012-08-18 MED ORDER — POTASSIUM CHLORIDE 10 MEQ/50ML IV SOLN: 10.0000 meq | INTRAVENOUS | Status: AC

## 2012-08-18 MED ORDER — INSULIN REGULAR BOLUS VIA INFUSION
0.0000 [IU] | Freq: Three times a day (TID) | INTRAVENOUS | Status: DC
  Filled 2012-08-18: qty 10

## 2012-08-18 MED ORDER — HEMOSTATIC AGENTS (NO CHARGE) OPTIME
TOPICAL | Status: DC | PRN
  Administered 2012-08-18: 1 via TOPICAL

## 2012-08-18 MED ORDER — OXYCODONE HCL 5 MG PO TABS
5.0000 mg | ORAL_TABLET | ORAL | Status: DC | PRN
  Administered 2012-08-19 (×2): 10 mg via ORAL
  Filled 2012-08-18 (×2): qty 2

## 2012-08-18 MED ORDER — PANTOPRAZOLE SODIUM 40 MG PO TBEC: 40.0000 mg | DELAYED_RELEASE_TABLET | Freq: Every day | ORAL | Status: DC

## 2012-08-18 MED ORDER — PROPOFOL 10 MG/ML IV BOLUS
INTRAVENOUS | Status: DC | PRN
  Administered 2012-08-18: 120 mg via INTRAVENOUS

## 2012-08-18 MED ORDER — SODIUM CHLORIDE 0.9 % IV SOLN
INTRAVENOUS | Status: DC
  Filled 2012-08-18: qty 1

## 2012-08-18 MED ORDER — VANCOMYCIN HCL IN DEXTROSE 1-5 GM/200ML-% IV SOLN
1000.0000 mg | Freq: Once | INTRAVENOUS | Status: AC
  Administered 2012-08-18: 1000 mg via INTRAVENOUS
  Filled 2012-08-18: qty 200

## 2012-08-18 MED ORDER — ONDANSETRON HCL 4 MG/2ML IJ SOLN
4.0000 mg | Freq: Four times a day (QID) | INTRAMUSCULAR | Status: DC | PRN
  Administered 2012-08-19: 4 mg via INTRAVENOUS
  Filled 2012-08-18: qty 2

## 2012-08-18 MED ORDER — THROMBIN 20000 UNITS EX SOLR
OROMUCOSAL | Status: DC | PRN
  Administered 2012-08-18 (×3): via TOPICAL

## 2012-08-18 MED ORDER — SODIUM CHLORIDE 0.9 % IV SOLN: 250.0000 mL | INTRAVENOUS | Status: DC

## 2012-08-18 MED ORDER — ARTIFICIAL TEARS OP OINT
TOPICAL_OINTMENT | OPHTHALMIC | Status: DC | PRN
  Administered 2012-08-18: 1 via OPHTHALMIC

## 2012-08-18 MED ORDER — MIDAZOLAM HCL 5 MG/5ML IJ SOLN
INTRAMUSCULAR | Status: DC | PRN
  Administered 2012-08-18: 2 mg via INTRAVENOUS
  Administered 2012-08-18: 3 mg via INTRAVENOUS
  Administered 2012-08-18 (×4): 2 mg via INTRAVENOUS
  Administered 2012-08-18: 3 mg via INTRAVENOUS
  Administered 2012-08-18 (×2): 2 mg via INTRAVENOUS

## 2012-08-18 MED ORDER — LIDOCAINE HCL (CARDIAC) 20 MG/ML IV SOLN
INTRAVENOUS | Status: DC | PRN
  Administered 2012-08-18: 70 mg via INTRAVENOUS

## 2012-08-18 MED ORDER — HEPARIN SODIUM (PORCINE) 1000 UNIT/ML IJ SOLN
INTRAMUSCULAR | Status: DC | PRN
  Administered 2012-08-18: 29000 [IU] via INTRAVENOUS

## 2012-08-18 MED ORDER — THROMBIN 20000 UNITS EX KIT
PACK | CUTANEOUS | Status: DC | PRN
  Administered 2012-08-18: 20000 [IU] via TOPICAL

## 2012-08-18 MED ORDER — VECURONIUM BROMIDE 10 MG IV SOLR
INTRAVENOUS | Status: DC | PRN
  Administered 2012-08-18 (×2): 5 mg via INTRAVENOUS
  Administered 2012-08-18: 4 mg via INTRAVENOUS
  Administered 2012-08-18: 6 mg via INTRAVENOUS

## 2012-08-18 MED ORDER — THROMBIN 20000 UNITS EX SOLR
CUTANEOUS | Status: AC
  Filled 2012-08-18: qty 20000

## 2012-08-18 MED ORDER — FENTANYL CITRATE 0.05 MG/ML IJ SOLN
INTRAMUSCULAR | Status: DC | PRN
  Administered 2012-08-18: 50 ug via INTRAVENOUS
  Administered 2012-08-18: 250 ug via INTRAVENOUS
  Administered 2012-08-18: 100 ug via INTRAVENOUS
  Administered 2012-08-18 (×2): 50 ug via INTRAVENOUS
  Administered 2012-08-18 (×2): 100 ug via INTRAVENOUS
  Administered 2012-08-18: 50 ug via INTRAVENOUS
  Administered 2012-08-18: 250 ug via INTRAVENOUS
  Administered 2012-08-18 (×2): 50 ug via INTRAVENOUS
  Administered 2012-08-18: 400 ug via INTRAVENOUS

## 2012-08-18 MED ORDER — DEXMEDETOMIDINE HCL IN NACL 200 MCG/50ML IV SOLN
0.1000 ug/kg/h | INTRAVENOUS | Status: DC
  Administered 2012-08-18: 0.3 ug/kg/h via INTRAVENOUS
  Filled 2012-08-18 (×2): qty 50

## 2012-08-18 MED ORDER — METOPROLOL TARTRATE 25 MG/10 ML ORAL SUSPENSION
12.5000 mg | Freq: Two times a day (BID) | ORAL | Status: DC
  Filled 2012-08-18 (×3): qty 5

## 2012-08-18 MED ORDER — METOPROLOL TARTRATE 1 MG/ML IV SOLN: 2.5000 mg | INTRAVENOUS | Status: DC | PRN

## 2012-08-18 MED ORDER — METOPROLOL TARTRATE 12.5 MG HALF TABLET
12.5000 mg | ORAL_TABLET | Freq: Two times a day (BID) | ORAL | Status: DC
  Administered 2012-08-19: 12.5 mg via ORAL
  Filled 2012-08-18 (×3): qty 1

## 2012-08-18 MED ORDER — ACETAMINOPHEN 500 MG PO TABS
1000.0000 mg | ORAL_TABLET | Freq: Four times a day (QID) | ORAL | Status: DC
  Administered 2012-08-19 (×2): 1000 mg via ORAL
  Filled 2012-08-18 (×6): qty 2

## 2012-08-18 MED ORDER — FAMOTIDINE IN NACL 20-0.9 MG/50ML-% IV SOLN
20.0000 mg | Freq: Two times a day (BID) | INTRAVENOUS | Status: DC
  Administered 2012-08-18: 20 mg via INTRAVENOUS

## 2012-08-18 MED ORDER — MIDAZOLAM HCL 2 MG/2ML IJ SOLN: 2.0000 mg | INTRAMUSCULAR | Status: DC | PRN

## 2012-08-18 MED ORDER — MAGNESIUM SULFATE 40 MG/ML IJ SOLN
4.0000 g | Freq: Once | INTRAMUSCULAR | Status: AC
  Administered 2012-08-18: 4 g via INTRAVENOUS
  Filled 2012-08-18: qty 100

## 2012-08-18 MED ORDER — DOCUSATE SODIUM 100 MG PO CAPS
200.0000 mg | ORAL_CAPSULE | Freq: Every day | ORAL | Status: DC
  Administered 2012-08-19: 200 mg via ORAL

## 2012-08-18 MED ORDER — SIMVASTATIN 10 MG PO TABS
10.0000 mg | ORAL_TABLET | Freq: Every day | ORAL | Status: DC
  Administered 2012-08-19 – 2012-08-22 (×4): 10 mg via ORAL
  Filled 2012-08-18 (×6): qty 1

## 2012-08-18 MED ORDER — ASPIRIN 81 MG PO CHEW: 324.0000 mg | CHEWABLE_TABLET | Freq: Every day | ORAL | Status: DC

## 2012-08-18 MED ORDER — SODIUM CHLORIDE 0.9 % IV SOLN
INTRAVENOUS | Status: DC
  Administered 2012-08-19: 04:00:00 via INTRAVENOUS

## 2012-08-18 MED ORDER — ALBUMIN HUMAN 5 % IV SOLN
250.0000 mL | INTRAVENOUS | Status: AC | PRN
  Administered 2012-08-18 (×4): 250 mL via INTRAVENOUS
  Filled 2012-08-18 (×2): qty 250

## 2012-08-18 MED ORDER — ASPIRIN EC 325 MG PO TBEC
325.0000 mg | DELAYED_RELEASE_TABLET | Freq: Every day | ORAL | Status: DC
  Administered 2012-08-19: 325 mg via ORAL
  Filled 2012-08-18: qty 1

## 2012-08-18 MED ORDER — NITROGLYCERIN IN D5W 200-5 MCG/ML-% IV SOLN: 0.0000 ug/min | INTRAVENOUS | Status: DC

## 2012-08-18 MED ORDER — ALBUMIN HUMAN 5 % IV SOLN
INTRAVENOUS | Status: DC | PRN
  Administered 2012-08-18 (×2): via INTRAVENOUS

## 2012-08-18 SURGICAL SUPPLY — 105 items
ATTRACTOMAT 16X20 MAGNETIC DRP (DRAPES) ×2 IMPLANT
BAG DECANTER FOR FLEXI CONT (MISCELLANEOUS) ×2 IMPLANT
BANDAGE ELASTIC 4 VELCRO ST LF (GAUZE/BANDAGES/DRESSINGS) ×2 IMPLANT
BANDAGE ELASTIC 6 VELCRO ST LF (GAUZE/BANDAGES/DRESSINGS) ×2 IMPLANT
BANDAGE GAUZE ELAST BULKY 4 IN (GAUZE/BANDAGES/DRESSINGS) ×2 IMPLANT
BASKET HEART (ORDER IN 25'S) (MISCELLANEOUS) ×1
BASKET HEART (ORDER IN 25S) (MISCELLANEOUS) ×1 IMPLANT
BLADE STERNUM SYSTEM 6 (BLADE) ×2 IMPLANT
BLADE SURG 11 STRL SS (BLADE) ×1 IMPLANT
BLADE SURG ROTATE 9660 (MISCELLANEOUS) ×2 IMPLANT
CANISTER SUCTION 2500CC (MISCELLANEOUS) ×2 IMPLANT
CATH ROBINSON RED A/P 18FR (CATHETERS) ×4 IMPLANT
CATH THORACIC 28FR (CATHETERS) ×2 IMPLANT
CATH THORACIC 28FR RT ANG (CATHETERS) IMPLANT
CATH THORACIC 36FR (CATHETERS) ×2 IMPLANT
CATH THORACIC 36FR RT ANG (CATHETERS) ×2 IMPLANT
CLIP FOGARTY SPRING 6M (CLIP) ×1 IMPLANT
CLIP RETRACTION 3.0MM CORONARY (MISCELLANEOUS) ×2 IMPLANT
CLIP TI MEDIUM 24 (CLIP) IMPLANT
CLIP TI WIDE RED SMALL 24 (CLIP) ×1 IMPLANT
CLOTH BEACON ORANGE TIMEOUT ST (SAFETY) ×2 IMPLANT
COVER MAYO STAND STRL (DRAPES) ×2 IMPLANT
COVER SURGICAL LIGHT HANDLE (MISCELLANEOUS) ×2 IMPLANT
CRADLE DONUT ADULT HEAD (MISCELLANEOUS) ×2 IMPLANT
DRAPE CARDIOVASCULAR INCISE (DRAPES) ×2
DRAPE SLUSH MACHINE 52X66 (DRAPES) IMPLANT
DRAPE SLUSH/WARMER DISC (DRAPES) IMPLANT
DRAPE SRG 135X102X78XABS (DRAPES) ×1 IMPLANT
DRSG COVADERM 4X14 (GAUZE/BANDAGES/DRESSINGS) ×2 IMPLANT
ELECT CAUTERY BLADE 6.4 (BLADE) ×2 IMPLANT
ELECT REM PT RETURN 9FT ADLT (ELECTROSURGICAL) ×4
ELECTRODE REM PT RTRN 9FT ADLT (ELECTROSURGICAL) ×2 IMPLANT
GLOVE BIO SURGEON STRL SZ 6 (GLOVE) ×7 IMPLANT
GLOVE BIO SURGEON STRL SZ 6.5 (GLOVE) ×2 IMPLANT
GLOVE BIO SURGEON STRL SZ7 (GLOVE) IMPLANT
GLOVE BIO SURGEON STRL SZ7.5 (GLOVE) ×2 IMPLANT
GLOVE BIOGEL PI IND STRL 6 (GLOVE) IMPLANT
GLOVE BIOGEL PI IND STRL 6.5 (GLOVE) IMPLANT
GLOVE BIOGEL PI IND STRL 7.0 (GLOVE) IMPLANT
GLOVE BIOGEL PI INDICATOR 6 (GLOVE)
GLOVE BIOGEL PI INDICATOR 6.5 (GLOVE) ×3
GLOVE BIOGEL PI INDICATOR 7.0 (GLOVE)
GLOVE EUDERMIC 7 POWDERFREE (GLOVE) ×4 IMPLANT
GLOVE ORTHO TXT STRL SZ7.5 (GLOVE) IMPLANT
GOWN PREVENTION PLUS XLARGE (GOWN DISPOSABLE) ×2 IMPLANT
GOWN STRL NON-REIN LRG LVL3 (GOWN DISPOSABLE) ×11 IMPLANT
HEMOSTAT POWDER SURGIFOAM 1G (HEMOSTASIS) ×6 IMPLANT
HEMOSTAT SURGICEL 2X14 (HEMOSTASIS) ×2 IMPLANT
INSERT FOGARTY 61MM (MISCELLANEOUS) IMPLANT
INSERT FOGARTY XLG (MISCELLANEOUS) IMPLANT
KIT BASIN OR (CUSTOM PROCEDURE TRAY) ×2 IMPLANT
KIT CATH CPB BARTLE (MISCELLANEOUS) ×2 IMPLANT
KIT ROOM TURNOVER OR (KITS) ×2 IMPLANT
KIT SUCTION CATH 14FR (SUCTIONS) ×2 IMPLANT
KIT VASOVIEW W/TROCAR VH 2000 (KITS) ×2 IMPLANT
NS IRRIG 1000ML POUR BTL (IV SOLUTION) ×11 IMPLANT
PACK OPEN HEART (CUSTOM PROCEDURE TRAY) ×2 IMPLANT
PAD ARMBOARD 7.5X6 YLW CONV (MISCELLANEOUS) ×4 IMPLANT
PENCIL BUTTON HOLSTER BLD 10FT (ELECTRODE) ×2 IMPLANT
PUNCH AORTIC ROTATE 4.0MM (MISCELLANEOUS) IMPLANT
PUNCH AORTIC ROTATE 4.5MM 8IN (MISCELLANEOUS) ×2 IMPLANT
PUNCH AORTIC ROTATE 5MM 8IN (MISCELLANEOUS) IMPLANT
SEALANT SURG COSEAL 4ML (VASCULAR PRODUCTS) ×1 IMPLANT
SET CARDIOPLEGIA MPS 5001102 (MISCELLANEOUS) ×2 IMPLANT
SOLUTION ANTI FOG 6CC (MISCELLANEOUS) ×1 IMPLANT
SPONGE GAUZE 4X4 12PLY (GAUZE/BANDAGES/DRESSINGS) ×4 IMPLANT
SPONGE INTESTINAL PEANUT (DISPOSABLE) IMPLANT
SPONGE LAP 18X18 X RAY DECT (DISPOSABLE) ×2 IMPLANT
SPONGE LAP 4X18 X RAY DECT (DISPOSABLE) ×2 IMPLANT
SUT BONE WAX W31G (SUTURE) ×2 IMPLANT
SUT MNCRL AB 4-0 PS2 18 (SUTURE) ×1 IMPLANT
SUT PROLENE 3 0 SH DA (SUTURE) IMPLANT
SUT PROLENE 3 0 SH1 36 (SUTURE) ×2 IMPLANT
SUT PROLENE 4 0 RB 1 (SUTURE)
SUT PROLENE 4 0 SH DA (SUTURE) IMPLANT
SUT PROLENE 4-0 RB1 .5 CRCL 36 (SUTURE) IMPLANT
SUT PROLENE 5 0 C 1 36 (SUTURE) IMPLANT
SUT PROLENE 6 0 C 1 30 (SUTURE) ×1 IMPLANT
SUT PROLENE 7 0 BV 1 (SUTURE) IMPLANT
SUT PROLENE 7 0 BV1 MDA (SUTURE) ×3 IMPLANT
SUT PROLENE 8 0 BV175 6 (SUTURE) IMPLANT
SUT SILK  1 MH (SUTURE)
SUT SILK 1 MH (SUTURE) IMPLANT
SUT STEEL STERNAL CCS#1 18IN (SUTURE) IMPLANT
SUT STEEL SZ 6 DBL 3X14 BALL (SUTURE) IMPLANT
SUT VIC AB 1 CTX 36 (SUTURE) ×6
SUT VIC AB 1 CTX36XBRD ANBCTR (SUTURE) ×2 IMPLANT
SUT VIC AB 2-0 CT1 27 (SUTURE) ×2
SUT VIC AB 2-0 CT1 TAPERPNT 27 (SUTURE) ×1 IMPLANT
SUT VIC AB 2-0 CTX 27 (SUTURE) IMPLANT
SUT VIC AB 3-0 SH 27 (SUTURE)
SUT VIC AB 3-0 SH 27X BRD (SUTURE) IMPLANT
SUT VIC AB 3-0 X1 27 (SUTURE) IMPLANT
SUT VICRYL 4-0 PS2 18IN ABS (SUTURE) IMPLANT
SUTURE E-PAK OPEN HEART (SUTURE) ×2 IMPLANT
SYSTEM SAHARA CHEST DRAIN ATS (WOUND CARE) ×2 IMPLANT
TAPE CLOTH SURG 4X10 WHT LF (GAUZE/BANDAGES/DRESSINGS) ×1 IMPLANT
TAPE PAPER 2X10 WHT MICROPORE (GAUZE/BANDAGES/DRESSINGS) ×2 IMPLANT
TOWEL OR 17X24 6PK STRL BLUE (TOWEL DISPOSABLE) ×2 IMPLANT
TOWEL OR 17X26 10 PK STRL BLUE (TOWEL DISPOSABLE) ×2 IMPLANT
TRAY FOLEY IC TEMP SENS 14FR (CATHETERS) ×2 IMPLANT
TUBE SUCT INTRACARD DLP 20F (MISCELLANEOUS) ×2 IMPLANT
TUBING INSUFFLATION 10FT LAP (TUBING) ×2 IMPLANT
UNDERPAD 30X30 INCONTINENT (UNDERPADS AND DIAPERS) ×2 IMPLANT
WATER STERILE IRR 1000ML POUR (IV SOLUTION) ×4 IMPLANT

## 2012-08-18 NOTE — Progress Notes (Signed)
SICU p.m. Rounds  Status post multivessel bypass grafting earlier today Extubated with stable hemodynamics Neuro intact Labs reviewed Lab Results  Component Value Date   CREATININE 1.03 08/16/2012   BUN 15 08/16/2012   NA 137 08/18/2012   K 4.6 08/18/2012   CL 104 08/16/2012   CO2 23 08/16/2012   continue to monitor

## 2012-08-18 NOTE — Anesthesia Preprocedure Evaluation (Signed)
Anesthesia Evaluation  Patient identified by MRN, date of birth, ID band Patient awake    Reviewed: Allergy & Precautions, H&P , NPO status , Patient's Chart, lab work & pertinent test results  Airway Mallampati: II      Dental   Pulmonary sleep apnea and Continuous Positive Airway Pressure Ventilation ,          Cardiovascular hypertension, Pt. on medications + CAD     Neuro/Psych    GI/Hepatic GERD-  ,  Endo/Other    Renal/GU      Musculoskeletal   Abdominal   Peds  Hematology   Anesthesia Other Findings   Reproductive/Obstetrics                           Anesthesia Physical Anesthesia Plan  ASA: III  Anesthesia Plan: General   Post-op Pain Management:    Induction: Intravenous  Airway Management Planned: Oral ETT  Additional Equipment:   Intra-op Plan:   Post-operative Plan:   Informed Consent:   Plan Discussed with: CRNA and Anesthesiologist  Anesthesia Plan Comments:         Anesthesia Quick Evaluation

## 2012-08-18 NOTE — Preoperative (Signed)
Beta Blockers   Reason not to administer Beta Blockers: not prescribed 

## 2012-08-18 NOTE — Transfer of Care (Signed)
Immediate Anesthesia Transfer of Care Note  Patient: [name]  Procedure(s) Performed: Procedure(s) (LRB) with comments: CORONARY ARTERY BYPASS GRAFTING (CABG) (N/A) - CABG x four;  using left internal mammary artery and right leg greater saphenous vein harvested endoscopically  Patient Location: SICU  Anesthesia Type: General  Level of Consciousness: sedated  Airway & Oxygen Therapy: Patient remains intubated per anesthesia plan and Patient placed on Ventilator (see vital sign flow sheet for setting)  Post-op Assessment: Report given to PACU RN and Post -op Vital signs reviewed and stable  Post vital signs: Reviewed and stable  Complications: No apparent anesthesia complications

## 2012-08-18 NOTE — H&P (Signed)
[address]            [place] [address]          [phone]      PCP is Pcp Not In System  Referring Provider is Charlton Haws, MD  Chief Complaint   Patient presents with   .  Coronary Artery Disease     Referral from Dr Eden Emms for surgical eval on CAD, cardiac cath on 08/03/12   HPI:  The patient is a 56 year old current heavy smoker with a history of hypertension, hyperlipidemia, obstructive sleep apnea, and COPD who has a history of coronary disease status post stenting of the mid right coronary artery in 2005 and stenting of the mid left circumflex in 2007. He now presents with a few months history of exertional substernal chest discomfort associated with shortness of breath. He has had no symptoms at rest.  Past Medical History   Diagnosis  Date   .  HYPERTRIGLYCERIDEMIA    .  HYPERTENSION    .  HYPERLIPIDEMIA    .  HEMORRHOIDS    .  CORONARY ARTERY DISEASE      a. s/p stent to RCA 2005; b. Last LHC 12/07: Mid LAD 40%, proximal circumflex 20%, mid circumflex 95%, mid RCA stent okay, normal LV function. PCI: Taxus DES to the [facility]; c. Myoview 10/11: EF 56%, no ischemia or scar   .  SLEEP APNEA    .  OBESITY    .  GERD    .  DYSPHAGIA UNSPECIFIED    .  DIVERTICULOSIS, COLON    .  COPD    .  COLONIC POLYPS, HX OF    .  CHEST PAIN     Past Surgical History   Procedure  Date   .  Cardiac catheterization    .  Right long finger extensor tendon repair    Back surgery for lumbar disc disease in past. No current problems  Family History   Problem  Relation  Age of Onset   .  Cancer  Mother    .  Heart disease  Brother    Social History  History   Substance Use Topics   .  Smoking status:  Current Every Day Smoker -- 1.5 packs/day   .  Smokeless tobacco:  Not on file   .  Alcohol Use:  Not on file    Current Outpatient Prescriptions   Medication  Sig  Dispense  Refill   .  aspirin 325 MG tablet  Take 325 mg by mouth daily.     .   clopidogrel (PLAVIX) 75 MG tablet  Take 1 tablet (75 mg total) by mouth daily.  30 tablet  12   .  lisinopril (PRINIVIL,ZESTRIL) 10 MG tablet  Take 1 tablet (10 mg total) by mouth daily.  30 tablet  12   .  Magnesium 250 MG TABS  Take 1 tablet by mouth daily.     .  nitroGLYCERIN (NITROSTAT) 0.4 MG SL tablet  Place 1 tablet (0.4 mg total) under the tongue every 5 (five) minutes as needed for chest pain.  25 tablet  12   .  Omega-3 Fatty Acids (FISH OIL) 1200 MG CAPS  Take 1 capsule by mouth daily.     [name]  omeprazole (PRILOSEC OTC) 20 MG tablet  Take 20 mg by mouth daily.     [name]  pravastatin (PRAVACHOL) 20 MG tablet  Take 1 tablet (20 mg total) by mouth every evening.  30 tablet  9   No Known Allergies  Review of Systems  Constitutional: Positive for fatigue. Negative for fever, diaphoresis, activity change, appetite change and unexpected weight change.  HENT: Negative.  Eyes: Negative.  Respiratory: Positive for shortness of breath.  With exertion.  Sleep apnea, uses CPAP  Cardiovascular: Positive for chest pain. Negative for palpitations and leg swelling.  Gastrointestinal:  Reflux  Genitourinary: Negative.  Musculoskeletal: Negative.  Neurological: Negative.  Hematological: Bruises/bleeds easily.  On Plavix  Psychiatric/Behavioral: Negative.  BP 134/78  Pulse 82  Resp 18  Ht 5\' 9"  (1.753 m)  Wt 213 lb (96.616 kg)  BMI 31.45 kg/m2  SpO2 96%  Physical Exam  Constitutional: He is oriented to person, place, and time. He appears well-developed and well-nourished. No distress.  HENT:  Head: Normocephalic and atraumatic.  Mouth/Throat: Oropharynx is clear and moist. No oropharyngeal exudate.  Eyes: Conjunctivae normal and EOM are normal. Pupils are equal, round, and reactive to light.  Neck: Normal range of motion. Neck supple. No JVD present. No thyromegaly present.  Cardiovascular: Normal rate, regular rhythm, normal heart sounds and intact distal pulses. Exam reveals no gallop and  no friction rub.  No murmur heard.  Pulmonary/Chest: Effort normal and breath sounds normal. No respiratory distress. He has no wheezes. He has no rales.  Abdominal: Soft. Bowel sounds are normal. He exhibits no distension and no mass. There is no tenderness.  Musculoskeletal: Normal range of motion. He exhibits no edema.  Lymphadenopathy:  He has no cervical adenopathy.  Neurological: He is alert and oriented to person, place, and time. He has normal strength. No cranial nerve deficit or sensory deficit.  Skin: Skin is warm and dry.  Psychiatric: He has a normal mood and affect.  Diagnostic Tests:  Cardiac Cath:  Procedural Findings:  Hemodynamics:  AO 131/74 with a mean of 98 mmHg  LV 138/23 mmHg  Coronary angiography:  Coronary dominance: right  Left mainstem: Normal.  Left anterior descending (LAD): There is a true bifurcation stenosis in the proximal to mid LAD up to 90% involving the LAD before and after the diagonal takeoff. The diagonal appears to be spared of significant disease.  Left circumflex (LCx): The stent in the mid circumflex is widely patent. At the distal stent margin there is a 70-80% focal stenosis.  Right coronary artery (RCA): The right coronary is a dominant vessel. In the mid vessel there is a stent with diffuse in-stent disease up to 50%. At the distal stent margin this is approximately 60-70%.  Left ventriculography: Left ventricular systolic function is normal, LVEF is estimated at 55-65%, there is no significant mitral regurgitation  Final Conclusions:  1. Three-vessel obstructive coronary disease. His most critical lesion is in the LAD at the bifurcation.  2. Normal left ventricular function.   Impression/Plan:   He has severe three-vessel coronary disease with exertional angina that is lifestyle limiting. I agree that coronary bypass graft surgery is the best treatment to prevent further ischemia and infarction and improve his quality of life and activity  level. I discussed the importance of cardiac risk factor reduction including complete smoking cessation. I think he and his wife understand the importance of this. I discussed the operative procedure with the patient and family including alternatives, benefits and risks; including but not limited to bleeding, blood transfusion, infection, stroke, myocardial infarction, graft failure, heart block requiring a permanent  pacemaker, organ dysfunction, and death. Carleene Overlie understands and agrees to proceed. We will schedule surgery for Wednesday, 08/18/2012 at the patient's request. He says that he and wife have plans for the weekend before that. 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.

## 2012-08-18 NOTE — Procedures (Signed)
Extubation Procedure Note  Patient Details:   Name: [name] DOB: 09-Mar-1956 MRN: [id number]   Pt extubated per protocol to 4L [place], VS stable, NIF & VC WNL, pt able to vocalize, no stridor noted, RT to monitor.   Evaluation  O2 sats: stable throughout Complications: No apparent complications Patient did tolerate procedure well. Bilateral Breath Sounds: Clear   Yes  Harley Hallmark 08/18/2012, 6:41 PM

## 2012-08-18 NOTE — Brief Op Note (Signed)
08/18/2012  12:06 PM  PATIENT:  [name]  56 y.o. male  PRE-OPERATIVE DIAGNOSIS:  CAD  POST-OPERATIVE DIAGNOSIS:  CAD  PROCEDURE:  Procedure(s) (LRB) with comments:  CORONARY ARTERY BYPASS GRAFTING x4  LIMA to LAD  SVG to Diagonal 1  SVG to Obtuse Marginal 1  SVG to Posterior Descending  ENDOSCOPIC SAPHENOUS VEIN HARVEST RIGHT LEG  SURGEON:  Surgeon(s) and Role:    * Alleen Borne, MD - Primary  PHYSICIAN ASSISTANT: Lowella Dandy PA-C  ANESTHESIA:   general  EBL:  Total I/O In: -  Out: 250 [Urine:250]  BLOOD ADMINISTERED: CC CELLSAVER  DRAINS: Left pleural chest tube, Mediastinal chest drains   LOCAL MEDICATIONS USED:  NONE  SPECIMEN:  No Specimen  DISPOSITION OF SPECIMEN:  N/A  COUNTS:  YES  TOURNIQUET:  * No tourniquets in log *  DICTATION: .Dragon Dictation  PLAN OF CARE: Admit to inpatient   PATIENT DISPOSITION:  ICU - intubated and hemodynamically stable.   Delay start of Pharmacological VTE agent (>24hrs) due to surgical blood loss or risk of bleeding: yes

## 2012-08-18 NOTE — Anesthesia Postprocedure Evaluation (Signed)
  Anesthesia Post-op Note  Patient: [name]  Procedure(s) Performed: Procedure(s) (LRB) with comments: CORONARY ARTERY BYPASS GRAFTING (CABG) (N/A) - CABG x four;  using left internal mammary artery and right leg greater saphenous vein harvested endoscopically  Patient Location: PACU and SICU  Anesthesia Type: General  Level of Consciousness: Patient remains intubated per anesthesia plan  Airway and Oxygen Therapy: Patient remains intubated per anesthesia plan  Post-op Pain: mild  Post-op Assessment: Post-op Vital signs reviewed  Post-op Vital Signs: Reviewed  Complications: No apparent anesthesia complications

## 2012-08-18 NOTE — Interval H&P Note (Signed)
History and Physical Interval Note:  08/18/2012 8:26 AM  [name]  has presented today for surgery, with the diagnosis of CAD  The various methods of treatment have been discussed with the patient and family. After consideration of risks, benefits and other options for treatment, the patient has consented to  Procedure(s) (LRB) with comments: CORONARY ARTERY BYPASS GRAFTING (CABG) (N/A) as a surgical intervention .  The patient's history has been reviewed, patient examined, no change in status, stable for surgery.  I have reviewed the patient's chart and labs.  Questions were answered to the patient's satisfaction.     Alleen Borne

## 2012-08-19 ENCOUNTER — Inpatient Hospital Stay (HOSPITAL_COMMUNITY): Payer: BC Managed Care – PPO

## 2012-08-19 ENCOUNTER — Encounter (HOSPITAL_COMMUNITY): Payer: Self-pay | Admitting: Surgery

## 2012-08-19 LAB — BASIC METABOLIC PANEL
CO2: 22 mEq/L (ref 19–32)
Calcium: 8.2 mg/dL — ABNORMAL LOW (ref 8.4–10.5)
Creatinine, Ser: 0.87 mg/dL (ref 0.50–1.35)
Glucose, Bld: 126 mg/dL — ABNORMAL HIGH (ref 70–99)

## 2012-08-19 LAB — GLUCOSE, CAPILLARY
Glucose-Capillary: 101 mg/dL — ABNORMAL HIGH (ref 70–99)
Glucose-Capillary: 108 mg/dL — ABNORMAL HIGH (ref 70–99)
Glucose-Capillary: 115 mg/dL — ABNORMAL HIGH (ref 70–99)
Glucose-Capillary: 119 mg/dL — ABNORMAL HIGH (ref 70–99)
Glucose-Capillary: 122 mg/dL — ABNORMAL HIGH (ref 70–99)
Glucose-Capillary: 130 mg/dL — ABNORMAL HIGH (ref 70–99)
Glucose-Capillary: 137 mg/dL — ABNORMAL HIGH (ref 70–99)
Glucose-Capillary: 164 mg/dL — ABNORMAL HIGH (ref 70–99)

## 2012-08-19 LAB — CBC
HCT: 31.8 % — ABNORMAL LOW (ref 39.0–52.0)
Hemoglobin: 10.9 g/dL — ABNORMAL LOW (ref 13.0–17.0)
MCH: 30.9 pg (ref 26.0–34.0)
MCV: 90.1 fL (ref 78.0–100.0)
RBC: 3.53 MIL/uL — ABNORMAL LOW (ref 4.22–5.81)

## 2012-08-19 MED ORDER — INSULIN ASPART 100 UNIT/ML ~~LOC~~ SOLN
0.0000 [IU] | Freq: Three times a day (TID) | SUBCUTANEOUS | Status: DC
  Administered 2012-08-19 – 2012-08-20 (×2): 2 [IU] via SUBCUTANEOUS

## 2012-08-19 MED ORDER — SODIUM CHLORIDE 0.9 % IJ SOLN: 3.0000 mL | INTRAMUSCULAR | Status: DC | PRN

## 2012-08-19 MED ORDER — FUROSEMIDE 40 MG PO TABS
40.0000 mg | ORAL_TABLET | Freq: Every day | ORAL | Status: AC
  Administered 2012-08-20 – 2012-08-21 (×2): 40 mg via ORAL
  Filled 2012-08-19 (×2): qty 1

## 2012-08-19 MED ORDER — ONDANSETRON HCL 4 MG PO TABS: 4.0000 mg | ORAL_TABLET | Freq: Four times a day (QID) | ORAL | Status: DC | PRN

## 2012-08-19 MED ORDER — ACETAMINOPHEN 325 MG PO TABS: 650.0000 mg | ORAL_TABLET | Freq: Four times a day (QID) | ORAL | Status: DC | PRN

## 2012-08-19 MED ORDER — TRAMADOL HCL 50 MG PO TABS
50.0000 mg | ORAL_TABLET | ORAL | Status: DC | PRN
  Administered 2012-08-19: 50 mg via ORAL
  Administered 2012-08-20 – 2012-08-23 (×2): 100 mg via ORAL
  Filled 2012-08-19 (×3): qty 2

## 2012-08-19 MED ORDER — ASPIRIN EC 325 MG PO TBEC
325.0000 mg | DELAYED_RELEASE_TABLET | Freq: Every day | ORAL | Status: DC
  Administered 2012-08-20 – 2012-08-23 (×4): 325 mg via ORAL
  Filled 2012-08-19 (×4): qty 1

## 2012-08-19 MED ORDER — DOCUSATE SODIUM 100 MG PO CAPS
200.0000 mg | ORAL_CAPSULE | Freq: Every day | ORAL | Status: DC
  Administered 2012-08-20 – 2012-08-23 (×4): 200 mg via ORAL
  Filled 2012-08-19 (×4): qty 2

## 2012-08-19 MED ORDER — INFLUENZA VIRUS VACC SPLIT PF IM SUSP
0.5000 mL | INTRAMUSCULAR | Status: AC | PRN
  Administered 2012-08-23: 0.5 mL via INTRAMUSCULAR
  Filled 2012-08-19: qty 0.5

## 2012-08-19 MED ORDER — PANTOPRAZOLE SODIUM 40 MG PO TBEC
40.0000 mg | DELAYED_RELEASE_TABLET | Freq: Every day | ORAL | Status: DC
  Administered 2012-08-19 – 2012-08-23 (×5): 40 mg via ORAL
  Filled 2012-08-19 (×5): qty 1

## 2012-08-19 MED ORDER — SODIUM CHLORIDE 0.9 % IJ SOLN
3.0000 mL | Freq: Two times a day (BID) | INTRAMUSCULAR | Status: DC
  Administered 2012-08-19 – 2012-08-22 (×7): 3 mL via INTRAVENOUS

## 2012-08-19 MED ORDER — BISACODYL 5 MG PO TBEC: 10.0000 mg | DELAYED_RELEASE_TABLET | Freq: Every day | ORAL | Status: DC | PRN

## 2012-08-19 MED ORDER — POTASSIUM CHLORIDE CRYS ER 20 MEQ PO TBCR
40.0000 meq | EXTENDED_RELEASE_TABLET | Freq: Every day | ORAL | Status: AC
  Administered 2012-08-19 – 2012-08-20 (×2): 40 meq via ORAL
  Filled 2012-08-19 (×2): qty 2

## 2012-08-19 MED ORDER — METOPROLOL TARTRATE 25 MG PO TABS
25.0000 mg | ORAL_TABLET | Freq: Two times a day (BID) | ORAL | Status: DC
  Administered 2012-08-19 – 2012-08-20 (×3): 25 mg via ORAL
  Filled 2012-08-19 (×5): qty 1

## 2012-08-19 MED ORDER — MOVING RIGHT ALONG BOOK
Freq: Once | Status: AC
  Administered 2012-08-19: 16:00:00
  Filled 2012-08-19: qty 1

## 2012-08-19 MED ORDER — FUROSEMIDE 10 MG/ML IJ SOLN
40.0000 mg | Freq: Once | INTRAMUSCULAR | Status: AC
  Administered 2012-08-19: 40 mg via INTRAVENOUS

## 2012-08-19 MED ORDER — OXYCODONE HCL 5 MG PO TABS
5.0000 mg | ORAL_TABLET | ORAL | Status: DC | PRN
  Administered 2012-08-19 – 2012-08-20 (×3): 5 mg via ORAL
  Administered 2012-08-20 (×2): 10 mg via ORAL
  Administered 2012-08-21: 5 mg via ORAL
  Administered 2012-08-21 – 2012-08-22 (×5): 10 mg via ORAL
  Administered 2012-08-22: 5 mg via ORAL
  Administered 2012-08-22: 10 mg via ORAL
  Administered 2012-08-22: 5 mg via ORAL
  Administered 2012-08-22: 10 mg via ORAL
  Filled 2012-08-19: qty 1
  Filled 2012-08-19 (×6): qty 2
  Filled 2012-08-19: qty 1
  Filled 2012-08-19 (×4): qty 2
  Filled 2012-08-19: qty 1
  Filled 2012-08-19 (×2): qty 2
  Filled 2012-08-19: qty 1

## 2012-08-19 MED ORDER — BISACODYL 10 MG RE SUPP: 10.0000 mg | Freq: Every day | RECTAL | Status: DC | PRN

## 2012-08-19 MED ORDER — SODIUM CHLORIDE 0.9 % IV SOLN: 250.0000 mL | INTRAVENOUS | Status: DC | PRN

## 2012-08-19 MED ORDER — ONDANSETRON HCL 4 MG/2ML IJ SOLN: 4.0000 mg | Freq: Four times a day (QID) | INTRAMUSCULAR | Status: DC | PRN

## 2012-08-19 MED ORDER — PANTOPRAZOLE SODIUM 40 MG PO TBEC: 40.0000 mg | DELAYED_RELEASE_TABLET | Freq: Every day | ORAL | Status: DC

## 2012-08-19 MED ORDER — PNEUMOCOCCAL VAC POLYVALENT 25 MCG/0.5ML IJ INJ: 0.5000 mL | INJECTION | INTRAMUSCULAR | Status: DC | PRN

## 2012-08-19 MED FILL — Magnesium Sulfate Inj 50%: INTRAMUSCULAR | Qty: 10 | Status: AC

## 2012-08-19 MED FILL — Potassium Chloride Inj 2 mEq/ML: INTRAVENOUS | Qty: 40 | Status: AC

## 2012-08-19 NOTE — Progress Notes (Signed)
As stated by patient home use

## 2012-08-19 NOTE — Op Note (Signed)
NAMEDYMIR, NEESON NO.:  [id number]  MEDICAL RECORD NO.:  [id number]  LOCATION:  2301                         FACILITY:  [facility]  PHYSICIAN:  Evelene Croon, M.D.     DATE OF BIRTH:  Dec 04, 1955  DATE OF PROCEDURE:  08/18/2012 DATE OF DISCHARGE:                              OPERATIVE REPORT   PREOPERATIVE DIAGNOSIS:  Severe three-vessel coronary artery disease.  POSTOPERATIVE DIAGNOSIS:  Severe three-vessel coronary artery disease.  OPERATIVE PROCEDURE:  Median sternotomy, extracorporeal circulation, coronary artery bypass graft surgery x4 using a left internal mammary artery graft to the left anterior descending coronary artery with a saphenous vein graft to the diagonal branch of the LAD, saphenous vein graft to the obtuse marginal branch of left circumflex coronary artery, and a saphenous vein graft to the posterior descending branch of the right coronary artery.  Endoscopic vein harvesting from the right leg.  ATTENDING SURGEON:  Evelene Croon, M.D.  ASSISTANT:  Pauline Good, PA-C.  ANESTHESIA:  General endotracheal.  CLINICAL HISTORY:  This patient is a 56 year old current heavy smoker with history of hypertension, hyperlipidemia, obstructive sleep apnea, and COPD who also has a history of coronary artery disease status post stenting of the mid right coronary artery in 2005 and stenting of the mid left circumflex in 2007.  He now presents with a few-month history of exertional substernal chest discomfort associated with shortness of breath.  Cardiac catheterization showed severe three-vessel disease. The left main was normal.  The LAD had a 90% midvessel stenosis involving the LAD before and after the diagonal takeoff.  The diagonal branch was a moderate-sized vessel without disease in it.  The left circumflex had a stent in the midportion and was widely patent.  At the distal margin of the stent, there was a 70% to 80% focal stenosis.  The right  coronary artery was a dominant vessel.  In the mid vessel, there was a stent with diffuse in-stent disease up to 50%.  At the distal margin of the stent, there was 60% to 70% stenosis.  Left ventricular ejection fraction was 55% to 65% with no significant mitral regurgitation.  After review of the catheterizations and examination of the patient, it was felt that coronary artery bypass graft surgery is the best treatment to prevent further ischemia and infarction.  I discussed the operative procedure with the patient and his wife including alternatives, benefits, and risks including, but not limited to bleeding, blood transfusion, infection, stroke, myocardial infarction, graft failure, and death.  Also discussed the importance of maximum cardiac risk factor reduction including complete smoking cessation.  He understood and agreed to proceed.  OPERATIVE PROCEDURE:  The patient was taken to the operating room and placed on the table in supine position.  After induction of general endotracheal anesthesia, a Foley catheter was placed in the bladder using sterile technique.  Then, the chest, abdomen, and both lower extremities were prepped and draped in usual sterile manner.  The chest was entered through a median sternotomy incision and the pericardium opened in the midline.  Examination of the heart showed good ventricular contractility.  The ascending aorta had no palpable plaques in it.  Then, the  left internal mammary artery was harvested from chest wall as a pedicle graft.  This was a medium-caliber vessel with excellent blood flow through it.  At the same time, a segment of greater saphenous vein was harvested from the right leg using endoscopic vein harvest technique.  This vein was of medium size and fair quality with somewhat thickened walls.  Then, the patient was heparinized.  When an adequate ACT was obtained, the distal ascending aorta was cannulated using a 22-French  aortic cannula for arterial inflow.  Venous outflow was achieved using a two- stage venous cannula through the right atrial appendage.  An antegrade cardioplegia and vent cannula was inserted in aortic root.  The patient was placed on cardiopulmonary bypass and distal coronaries identified.  The LAD was intramyocardial throughout most of its extent. It did exit the surface near the apex of the heart.  I was able to locate the vessel in the midportion where it was a moderate-sized vessel with no significant disease in it.  The diagonal branch was mildly diseased throughout, but was a good graftable vessel.  The obtuse marginal was heavily diseased proximally with a palpable stent in place. The vessel became intramyocardial after the proximal disease portion and this was traced down into the muscle until the vessel became softer and suitable for grafting.  The right coronary artery was diseased throughout the main body.  The posterior descending branch was a moderate-sized vessel and had no disease in it.  Then, the aorta was crossclamped and 800 mL of cold blood antegrade cardioplegia was administered in the aortic root with quick arrest of the heart.  Systemic hypothermia to 20 degrees centigrade and topical hypothermia with iced saline was used.  Temperature probe was placed in the septum and insulating pad in the pericardium.  The first distal anastomosis was performed in the posterior descending coronary artery.  The internal diameter proximally was 1.75 mm.  The conduit used was a segment of greater saphenous vein and the anastomosis performed end-to-side manner using continuous 7-0 Prolene suture.  Flow was noted through the graft and was excellent.  The second distal anastomosis was performed to the obtuse marginal branch.  The internal diameter of this vessel was also about 1.75 mm. The conduit used was a second segment of greater saphenous vein and anastomosis performed  end-to-side manner using continuous 7-0 Prolene suture.  Flow was noted through the graft and was excellent.  Then, another dose of cardioplegia given down vein grafts and aortic root.  The third distal anastomosis was performed to the diagonal branch.  The internal diameter was 1.6 mm.  The conduit used was a third segment of greater saphenous vein and the anastomosis performed end-to-side manner using continuous 7-0 Prolene suture.  Flow was noted through the graft and was excellent.  The fourth distal anastomosis was performed to the mid LAD.  The internal diameter was 1.75 mm.  The conduit used was a left internal mammary graft and this was brought out through an opening left pericardium anterior to the phrenic nerve.  This anastomosed to LAD in end-to-side manner using continuous 8-0 Prolene suture.  The pedicle was sutured to the epicardium with 6-0 Prolene sutures.  Then, another dose of cardioplegia was given.  With crossclamp in place, the 3 proximal vein graft anastomoses were performed to the mid ascending aorta in an end-to-side manner with continuous 6-0 Prolene suture.  Then, the clamp was removed from the mammary pedicle.  There was rapid warming of  the ventricular septum and return of spontaneous ventricular fibrillation. The crossclamp was then removed with time 92 minutes, and the patient spontaneously converted to sinus rhythm.  The proximal and distal anastomoses appeared hemostatic and allowed the grafts satisfactory. Graft marker was placed around the proximal anastomosis.  Two temporary right ventricular and right atrial pacing wires placed and brought out through the skin.  When the patient rewarmed to 37 degrees centigrade, he was weaned from cardiopulmonary bypass on no inotropic agents.  Total bypass time was 111 minutes.  Cardiac function appeared excellent with a cardiac output of 5 L per minute.  Protamine was given and the venous and aortic cannulae  removed without difficulty.  Hemostasis was achieved.  Three chest tubes were placed with 2 in the posterior pericardium and left pleural space and one in the anterior mediastinum.  The sternum was then closed with double #6 stainless steel wires.  Fascia was closed with continuous #1 Vicryl suture.  Subcutaneous tissue was closed with continuous 2-0 Vicryl and the skin with a 3-0 Vicryl subcuticular closure.  The lower extremity vein harvest site was closed in layers in similar manner.  The sponge, needle, and instrument counts were correct according to the scrub nurse.  Dry sterile dressing was applied over the incisions around chest tubes, which were hooked to Pleur-Evac suction. The patient remained hemodynamically stable and was transported to the SICU in guarded, but stable condition.     Evelene Croon, M.D.     BB/MEDQ  D:  08/18/2012  T:  08/19/2012  Job:  [id number]

## 2012-08-19 NOTE — Progress Notes (Signed)
UR COMPLETED  

## 2012-08-19 NOTE — Progress Notes (Signed)
1 Day Post-Op Procedure(s) (LRB): CORONARY ARTERY BYPASS GRAFTING (CABG) (N/A) Subjective: No complaints  Objective: Vital signs in last 24 hours: Temp:  [96.8 F (36 C)-99.5 F (37.5 C)] 99.3 F (37.4 C) (10/24 0700) Pulse Rate:  [60-89] 86  (10/24 0700) Cardiac Rhythm:  [-] Atrial paced (10/24 0730) Resp:  [9-26] 22  (10/24 0700) BP: (83-123)/(46-77) 123/77 mmHg (10/24 0700) SpO2:  [96 %-100 %] 97 % (10/24 0700) Arterial Line BP: (84-142)/(47-77) 140/54 mmHg (10/24 0700) FiO2 (%):  [40 %-50 %] 40 % (10/23 1830) Weight:  [95.2 kg (209 lb 14.1 oz)-98.8 kg (217 lb 13 oz)] 98.8 kg (217 lb 13 oz) (10/24 0500)  Hemodynamic parameters for last 24 hours: PAP: (19-32)/(7-21) 25/11 mmHg CO:  [3.9 L/min-5.9 L/min] 5.9 L/min CI:  [1.8 L/min/m2-2.8 L/min/m2] 2.8 L/min/m2  Intake/Output from previous day: 10/23 0701 - 10/24 0700 In: 6402.7 [P.O.:360; I.V.:3321.7; Blood:705; IV Piggyback:2016] Out: 3070 [Urine:2060; Blood:450; Chest Tube:560] Intake/Output this shift:    General appearance: alert and cooperative Neurologic: intact Heart: regular rate and rhythm, S1, S2 normal, no murmur, click, rub or gallop Lungs: clear to auscultation bilaterally Extremities: extremities normal, atraumatic, no cyanosis or edema Wound: incision ok  Lab Results:  Basename 08/19/12 0400 08/18/12 2036 08/18/12 2000  WBC 14.6* -- 14.3*  HGB 10.9* 11.2* --  HCT 31.8* 33.0* --  PLT 136* -- 127*   BMET:  Basename 08/19/12 0400 08/18/12 2036 08/16/12 1441  NA 138 139 --  [name] 4.2 4.6 --  CL 106 106 --  CO2 22 -- 23  GLUCOSE 126* 161* --  BUN 15 16 --  CREATININE 0.87 1.00 --  CALCIUM 8.2* -- 9.3    PT/INR:  Basename 08/18/12 1415  LABPROT 15.7*  INR 1.28   ABG    Component Value Date/Time   PHART 7.370 08/18/2012 2040   HCO3 25.1* 08/18/2012 2040   TCO2 26 08/18/2012 2040   ACIDBASEDEF 3.0* 08/18/2012 1832   O2SAT 96.0 08/18/2012 2040   CBG (last 3)   Basename 08/19/12 0259 08/18/12  2349 08/18/12 1918  GLUCAP 119* 137* 153*   CXR:  Clear  ECG: NSR, no acute changes Assessment/Plan: S/P Procedure(s) (LRB): CORONARY ARTERY BYPASS GRAFTING (CABG) (N/A) Mobilize Diuresis Diabetes control Plan for transfer to step-down: see transfer orders   LOS: 1 day    [name] 08/19/2012

## 2012-08-19 NOTE — Progress Notes (Signed)
Pt tolerated ambulating 350 ft in hallway with O2 2 liters nasal cannula, sat 92 % 2 rest breaks. Voided 1st time since foley out 250cc. Will continue to follow [name]

## 2012-08-19 NOTE — Progress Notes (Signed)
CARDIAC REHAB PHASE I   PRE:  Rate/Rhythm: 80 SR    BP: sitting 130/70    SaO2: 90 2L  MODE:  Ambulation: 350 ft   POST:  Rate/Rhythm: 92 SR    BP: sitting 132/74     SaO2: 93 4L  Pt with pain, tense. Had just gotten to bed. Able to walk with RW, assist x1. Felt SOB due to pain. Some dizziness.  Used 4L. Left pt on 3L in bed after walk. Encouraged IS.  [phone]  Harriet Masson CES, ACSM

## 2012-08-20 LAB — CBC
HCT: 32.7 % — ABNORMAL LOW (ref 39.0–52.0)
Hemoglobin: 10.8 g/dL — ABNORMAL LOW (ref 13.0–17.0)
MCH: 30.5 pg (ref 26.0–34.0)
MCHC: 33 g/dL (ref 30.0–36.0)

## 2012-08-20 LAB — GLUCOSE, CAPILLARY
Glucose-Capillary: 118 mg/dL — ABNORMAL HIGH (ref 70–99)
Glucose-Capillary: 119 mg/dL — ABNORMAL HIGH (ref 70–99)

## 2012-08-20 LAB — BASIC METABOLIC PANEL
BUN: 16 mg/dL (ref 6–23)
GFR calc non Af Amer: >90 mL/min (ref >90)
Glucose, Bld: 134 mg/dL — ABNORMAL HIGH (ref 70–99)
Potassium: 4.4 mEq/L (ref 3.5–5.1)

## 2012-08-20 MED FILL — Sodium Chloride Irrigation Soln 0.9%: Qty: 3000 | Status: AC

## 2012-08-20 MED FILL — Heparin Sodium (Porcine) Inj 1000 Unit/ML: INTRAMUSCULAR | Qty: 10 | Status: AC

## 2012-08-20 MED FILL — Lidocaine HCl IV Inj 20 MG/ML: INTRAVENOUS | Qty: 5 | Status: AC

## 2012-08-20 MED FILL — Electrolyte-R (PH 7.4) Solution: INTRAVENOUS | Qty: 5000 | Status: AC

## 2012-08-20 MED FILL — Mannitol IV Soln 20%: INTRAVENOUS | Qty: 500 | Status: AC

## 2012-08-20 MED FILL — Heparin Sodium (Porcine) Inj 1000 Unit/ML: INTRAMUSCULAR | Qty: 30 | Status: AC

## 2012-08-20 MED FILL — Sodium Chloride IV Soln 0.9%: INTRAVENOUS | Qty: 1000 | Status: AC

## 2012-08-20 MED FILL — Sodium Bicarbonate IV Soln 8.4%: INTRAVENOUS | Qty: 50 | Status: AC

## 2012-08-20 NOTE — Progress Notes (Signed)
Pt ambulated 550 ft with RW and 3L O2 via [place].  No complaints of pain, SOB, dizziness or fatigue.  Gait steady, mood jovial.  Returned to reliner after walk with call bell in reach and wife at side.  O2 at 2L while seated/resting.  Will con't plan of care.

## 2012-08-20 NOTE — Progress Notes (Signed)
CARDIAC REHAB PHASE I   PRE:  Rate/Rhythm: 77 SR    BP: sitting 112/60    SaO2: 92 2L 89 RA  MODE:  Ambulation: 550 ft   POST:  Rate/Rhythm: 88    BP: sitting 120/74     SaO2: 93 3L  Tolerated fairly well. Still requiring O2 at rest and with activity. To recliner. Encouraged x2 more walks and hourly IS.  [phone]  Harriet Masson CES, ACSM

## 2012-08-20 NOTE — Progress Notes (Signed)
Pt refusing CPAP. Says he will wait until family brings in his home unit.

## 2012-08-20 NOTE — Progress Notes (Addendum)
2 Days Post-Op Procedure(s) (LRB): CORONARY ARTERY BYPASS GRAFTING (CABG) (N/A) Subjective:  [name] states he feels like he has been "shot at" a couple of times this morning.  He is ambulating some, No BM  Objective: Vital signs in last 24 hours: Temp:  [98.3 F (36.8 C)-99.3 F (37.4 C)] 98.9 F (37.2 C) (10/25 0414) Pulse Rate:  [77-91] 77  (10/25 0414) Cardiac Rhythm:  [-] Normal sinus rhythm (10/25 0414) Resp:  [18-23] 18  (10/25 0414) BP: (114-130)/(56-72) 121/72 mmHg (10/25 0414) SpO2:  [91 %-96 %] 93 % (10/25 0414) Arterial Line BP: (123)/(52) 123/52 mmHg (10/24 0800) Weight:  [216 lb 14.9 oz (98.4 kg)-217 lb 13 oz (98.8 kg)] 216 lb 14.9 oz (98.4 kg) (10/25 0414)  Hemodynamic parameters for last 24 hours: PAP: (26)/(12) 26/12 mmHg  Intake/Output from previous day: 10/24 0701 - 10/25 0700 In: 476 [P.O.:360; I.V.:66; IV Piggyback:50] Out: 795 [Urine:795]  General appearance: alert, cooperative and no distress Heart: regular rate and rhythm Lungs: clear to auscultation bilaterally Abdomen: soft, non-tender; bowel sounds normal; no masses,  no organomegaly Extremities: edema trace Wound: clean and dry  Lab Results:  Basename 08/20/12 0450 08/19/12 0400  WBC 17.3* 14.6*  HGB 10.8* 10.9*  HCT 32.7* 31.8*  PLT 127* 136*   BMET:  Basename 08/20/12 0450 08/19/12 0400  NA 132* 138  K 4.4 4.2  CL 98 106  CO2 25 22  GLUCOSE 134* 126*  BUN 16 15  CREATININE 0.88 0.87  CALCIUM 8.7 8.2*    PT/INR:  Basename 08/18/12 1415  LABPROT 15.7*  INR 1.28   ABG    Component Value Date/Time   PHART 7.370 08/18/2012 2040   HCO3 25.1* 08/18/2012 2040   TCO2 26 08/18/2012 2040   ACIDBASEDEF 3.0* 08/18/2012 1832   O2SAT 96.0 08/18/2012 2040   CBG (last 3)   Basename 08/20/12 0608 08/19/12 2047 08/19/12 1610  GLUCAP 144* 128* 130*    Assessment/Plan: S/P Procedure(s) (LRB): CORONARY ARTERY BYPASS GRAFTING (CABG) (N/A)  1. CV- NSR, rate and pressure well  controlled with Lopressor 2. Pulm- + atelectasis, encouraged IS use, wean oxygen as tolerated 3. Volume Overload- patients weight is up about 7lbs since admission will continue Lasix 4. CBGs- pretty well controlled, patient is not a diabetic, will d/c fingersticks 5. Leukocytosis- most likely associated with atelectasis, afebrile 6. Dispo- patient doing well for early post op, will wean oxygen as tolerated, encouraged ambulation, likely d/c EPW in AM if continues to maintain NSR   LOS: 2 days    [name] 08/20/2012    Chart reviewed, patient examined, agree with above.

## 2012-08-20 NOTE — Progress Notes (Signed)
Pt attempt to have BM unsuccessful though passing gas. Urinated in toilet, output unmeasured.

## 2012-08-21 LAB — EXPECTORATED SPUTUM ASSESSMENT W GRAM STAIN, RFLX TO RESP C

## 2012-08-21 LAB — GLUCOSE, CAPILLARY: Glucose-Capillary: 136 mg/dL — ABNORMAL HIGH (ref 70–99)

## 2012-08-21 MED ORDER — IPRATROPIUM-ALBUTEROL 18-103 MCG/ACT IN AERO
2.0000 | INHALATION_SPRAY | RESPIRATORY_TRACT | Status: DC | PRN
  Filled 2012-08-21: qty 14.7

## 2012-08-21 MED ORDER — MOXIFLOXACIN HCL 400 MG PO TABS
400.0000 mg | ORAL_TABLET | Freq: Every day | ORAL | Status: DC
  Administered 2012-08-21 – 2012-08-22 (×2): 400 mg via ORAL
  Filled 2012-08-21 (×3): qty 1

## 2012-08-21 MED ORDER — METOPROLOL TARTRATE 1 MG/ML IV SOLN: 5.0000 mg | Freq: Once | INTRAVENOUS | Status: DC

## 2012-08-21 MED ORDER — IPRATROPIUM-ALBUTEROL 18-103 MCG/ACT IN AERO
2.0000 | INHALATION_SPRAY | RESPIRATORY_TRACT | Status: DC
  Administered 2012-08-21 (×3): 2 via RESPIRATORY_TRACT
  Filled 2012-08-21: qty 14.7

## 2012-08-21 MED ORDER — METOPROLOL TARTRATE 25 MG PO TABS
25.0000 mg | ORAL_TABLET | Freq: Two times a day (BID) | ORAL | Status: DC
  Administered 2012-08-21 – 2012-08-23 (×5): 25 mg via ORAL
  Filled 2012-08-21 (×6): qty 1

## 2012-08-21 MED ORDER — AMIODARONE IV BOLUS ONLY 150 MG/100ML
150.0000 mg | Freq: Once | INTRAVENOUS | Status: DC
  Filled 2012-08-21: qty 100

## 2012-08-21 MED ORDER — METOPROLOL TARTRATE 1 MG/ML IV SOLN
5.0000 mg | Freq: Two times a day (BID) | INTRAVENOUS | Status: DC
  Filled 2012-08-21: qty 5

## 2012-08-21 MED ORDER — AMIODARONE HCL 200 MG PO TABS
400.0000 mg | ORAL_TABLET | Freq: Two times a day (BID) | ORAL | Status: DC
Start: 2012-08-21 — End: 2012-08-21
  Filled 2012-08-21 (×2): qty 2

## 2012-08-21 MED ORDER — AMIODARONE HCL 200 MG PO TABS
400.0000 mg | ORAL_TABLET | Freq: Two times a day (BID) | ORAL | Status: DC
  Administered 2012-08-21 – 2012-08-23 (×5): 400 mg via ORAL
  Filled 2012-08-21 (×6): qty 2

## 2012-08-21 NOTE — Progress Notes (Signed)
Ed completed with wife and sister present. Voiced understanding and requests his name be sent to [facility]. Pt sts he wants to quit smoking. Wife and sister smoke as well. Discussed cessation and resources with all of them. Encouraged pt to try patches and call 1800-quit-now. [phone] Ethelda Chick CES, ACSM

## 2012-08-21 NOTE — Progress Notes (Addendum)
[address]            [place] [address]          [phone]     3 Days Post-Op  Procedure(s) (LRB): CORONARY ARTERY BYPASS GRAFTING (CABG) (N/A) Subjective: + productive sputum/cough, overall feels fairly well, some soreness  Objective  Telemetry aflutter, started on amiodarone, currently sinus rhythm  Temp:  [98.2 F (36.8 C)-98.9 F (37.2 C)] 98.8 F (37.1 C) (10/26 0400) Pulse Rate:  [79-101] 101  (10/26 0400) Resp:  [18] 18  (10/26 0400) BP: (114-121)/(61-74) 121/74 mmHg (10/26 0400) SpO2:  [93 %-99 %] 93 % (10/26 0400)   Intake/Output Summary (Last 24 hours) at 08/21/12 0850 Last data filed at 08/21/12 0333  Gross per 24 hour  Intake      3 ml  Output    900 ml  Net   -897 ml       General appearance: alert, cooperative and no distress Heart: regular rate and rhythm Lungs: some upper airway ronchi, sim in bases Abdomen: benign Extremities: mild edema Wound: incisions healing well  Lab Results:  Basename 08/20/12 0450 08/19/12 0400 08/18/12 2000  NA 132* 138 --  K 4.4 4.2 --  CL 98 106 --  CO2 25 22 --  GLUCOSE 134* 126* --  BUN 16 15 --  CREATININE 0.88 0.87 --  CALCIUM 8.7 8.2* --  MG -- 2.7* 2.9*  PHOS -- -- --   No results found for this basename: AST:2,ALT:2,ALKPHOS:2,BILITOT:2,PROT:2,ALBUMIN:2 in the last 72 hours No results found for this basename: LIPASE:2,AMYLASE:2 in the last 72 hours  Basename 08/20/12 0450 08/19/12 0400  WBC 17.3* 14.6*  NEUTROABS -- --  HGB 10.8* 10.9*  HCT 32.7* 31.8*  MCV 92.4 90.1  PLT 127* 136*   No results found for this basename: CKTOTAL:4,CKMB:4,TROPONINI:4 in the last 72 hours No components found with this basename: POCBNP:3 No results found for this basename: DDIMER in the last 72 hours No results found for this basename: HGBA1C in the last 72 hours No results found for this basename: CHOL,HDL,LDLCALC,TRIG,CHOLHDL in the last 72 hours No results found for this  basename: TSH,T4TOTAL,FREET3,T3FREE,THYROIDAB in the last 72 hours No results found for this basename: VITAMINB12,FOLATE,FERRITIN,TIBC,IRON,RETICCTPCT in the last 72 hours  Medications: Scheduled    . amiodarone  150 mg Intravenous Once  . amiodarone  400 mg Oral BID  . aspirin EC  325 mg Oral Daily  . docusate sodium  200 mg Oral Daily  . furosemide  40 mg Oral Daily  . insulin aspart  0-24 Units Subcutaneous TID AC & HS  . metoprolol  5 mg Intravenous Once  . metoprolol tartrate  25 mg Oral BID  . pantoprazole  40 mg Oral QAC breakfast  . potassium chloride  40 mEq Oral Daily  . simvastatin  10 mg Oral q1800  . sodium chloride  3 mL Intravenous Q12H  . DISCONTD: amiodarone  400 mg Oral BID  . DISCONTD: metoprolol  5 mg Intravenous Q12H  . DISCONTD: metoprolol tartrate  25 mg Oral BID     Radiology/Studies:  No results found.  INR: Will add last result for INR, ABG once components are confirmed Will add last 4 CBG results once components are confirmed  Assessment/Plan: S/P Procedure(s) (LRB): CORONARY ARTERY BYPASS GRAFTING (CABG) (N/A)  1. Doing well 2  now on amiodarone for afib/flutter, also beta  blockade, continue 3 Clinical bronchitis with heavy tobacco use HX, increased leukocytosis, check sputum culture, add combivent, add avelox, flutter valve 4 gentle diuresis 5 cbg's ok 6 routine cardiac rehab     LOS: 3 days    GOLD,WAYNE E 10/26/20138:50 AM     Chart reviewed, patient examined, agree with above. Had brief atrial flutter with RVR overnight and amio started but was already back in sinus before it was started. Will continue amiodarone for 4 weeks postop since he is likely to have more atrial fib/flutter.

## 2012-08-21 NOTE — Progress Notes (Signed)
CARDIAC REHAB PHASE I   PRE:  Rate/Rhythm: 80 SR    BP: sitting 134/80    SaO2: 96 2L   MODE:  Ambulation: 750 ft   POST:  Rate/Rhythm: 100 ST    BP: sitting 150/70     SaO2: 89-98 RA  Pt doing well. Walked without O2. Briefly down to 87-89 RA but up to 90s with pursed lip breathing. Highest was 98 with PLB. Feels good. To recliner, left O2 off at pts request. SaO2 89-90 in chair. Would like RW for home. Will f/u. [phone]  [name] CES, ACSM

## 2012-08-21 NOTE — Progress Notes (Signed)
Patient told RT that he would place cpap on when he gets ready to wear it. RT will continue to monitor.

## 2012-08-21 NOTE — Progress Notes (Signed)
Monitor tech informs the nurse @ [phone] that the patient was in A-Flutter with a HR of 140 and sustaining. Nurse checks in on the patient and the patient was asymptomatic. Nurse instructed the tech to perform an ECG and to obtain Vitals. Nurse contacts Dr. Laneta Simmers, & he instructed the nurse to administer 150 mg bolus of Amiodarone, give 400 mg po of amio, and one time dose of metoprolol 5mg  IV. As the nurse was preparing to carry out Dr. Sharee Pimple order, the nurse asked the monitor tech what rhythm and HR the patient was exhibiting. The monitor tech informed the nurse that the patient was back in sinus rhythm with a HR between 70-80's and was sustaining. The nurse placed a hold on giving the metoprolol IV 5 mg, and called Dr. Laneta Simmers to inform him of the change. Dr. Laneta Simmers instructed the nurse to just give the PO amio 400mg  and to hold the Bolus and IV metoprolol. The nurse performed as was instructed. Check chart for vitals. [name]

## 2012-08-22 LAB — GLUCOSE, CAPILLARY

## 2012-08-22 MED ORDER — METOPROLOL TARTRATE 25 MG PO TABS: 25.0000 mg | ORAL_TABLET | Freq: Two times a day (BID) | ORAL | Status: DC

## 2012-08-22 MED ORDER — MOXIFLOXACIN HCL 400 MG PO TABS: 400.0000 mg | ORAL_TABLET | Freq: Every day | ORAL | Status: DC

## 2012-08-22 MED ORDER — OXYCODONE HCL 5 MG PO TABS: 5.0000 mg | ORAL_TABLET | ORAL | Status: DC | PRN

## 2012-08-22 MED ORDER — AMIODARONE HCL 400 MG PO TABS: 400.0000 mg | ORAL_TABLET | Freq: Two times a day (BID) | ORAL | Status: DC

## 2012-08-22 NOTE — Progress Notes (Signed)
dc'ed pacing wires per unit protocol pt, tolerated well

## 2012-08-22 NOTE — Progress Notes (Addendum)
                   [address]            [place] [address]          [phone]     4 Days Post-Op  Procedure(s) (LRB): CORONARY ARTERY BYPASS GRAFTING (CABG) (N/A) Subjective:  looks and feels much better this am, less cough and less sputum  Objective  Telemetry sinus  Temp:  [98.5 F (36.9 C)-99.5 F (37.5 C)] 98.5 F (36.9 C) (10/27 0421) Pulse Rate:  [74-85] 74  (10/27 0421) Resp:  [18-19] 18  (10/27 0421) BP: (116-127)/(70-80) 127/80 mmHg (10/27 0421) SpO2:  [91 %-95 %] 95 % (10/27 0426) Weight:  [213 lb 6.4 oz (96.798 kg)] 213 lb 6.4 oz (96.798 kg) (10/27 0426)  No intake or output data in the 24 hours ending 08/22/12 1100     General appearance: alert, cooperative and no distress Heart: regular rate and rhythm Lungs: slightly dim in bases , otherwise clear Abdomen: benign Extremities: mild edema Wound: incisions healing well  Lab Results:  Basename 08/20/12 0450  NA 132*  K 4.4  CL 98  CO2 25  GLUCOSE 134*  BUN 16  CREATININE 0.88  CALCIUM 8.7  MG --  PHOS --   No results found for this basename: AST:2,ALT:2,ALKPHOS:2,BILITOT:2,PROT:2,ALBUMIN:2 in the last 72 hours No results found for this basename: LIPASE:2,AMYLASE:2 in the last 72 hours  Basename 08/20/12 0450  WBC 17.3*  NEUTROABS --  HGB 10.8*  HCT 32.7*  MCV 92.4  PLT 127*   No results found for this basename: CKTOTAL:4,CKMB:4,TROPONINI:4 in the last 72 hours No components found with this basename: POCBNP:3 No results found for this basename: DDIMER in the last 72 hours No results found for this basename: HGBA1C in the last 72 hours No results found for this basename: CHOL,HDL,LDLCALC,TRIG,CHOLHDL in the last 72 hours No results found for this basename: TSH,T4TOTAL,FREET3,T3FREE,THYROIDAB in the last 72 hours No results found for this basename: VITAMINB12,FOLATE,FERRITIN,TIBC,IRON,RETICCTPCT in the last 72 hours  Medications: Scheduled    . amiodarone  150 mg  Intravenous Once  . amiodarone  400 mg Oral BID  . aspirin EC  325 mg Oral Daily  . docusate sodium  200 mg Oral Daily  . insulin aspart  0-24 Units Subcutaneous TID AC & HS  . metoprolol  5 mg Intravenous Once  . metoprolol tartrate  25 mg Oral BID  . moxifloxacin  400 mg Oral q1800  . pantoprazole  40 mg Oral QAC breakfast  . simvastatin  10 mg Oral q1800  . sodium chloride  3 mL Intravenous Q12H  . DISCONTD: albuterol-ipratropium  2 puff Inhalation Q4H     Radiology/Studies:  No results found.  INR: Will add last result for INR, ABG once components are confirmed Will add last 4 CBG results once components are confirmed  Assessment/Plan: S/P Procedure(s) (LRB): CORONARY ARTERY BYPASS GRAFTING (CABG) (N/A)  1. Doing well 2 Push pulm toilet/rehab 3 monitor rhythm 4 d/c epw's today, recheck labs in am 5 poss home in am     LOS: 4 days    GOLD,WAYNE E 10/27/201311:00 AM    Chart reviewed, patient examined, agree with above. He looks good. If no further arrhythmias he can go home in am.

## 2012-08-22 NOTE — Discharge Summary (Signed)
[address]            [place] [address]          [phone]      [name] [birth year] 56 y.o. [id number]  08/18/2012   Alleen Borne, MD  CAD    HPI:  The patient is a 56 year old current heavy smoker with a history of hypertension, hyperlipidemia, obstructive sleep apnea, and COPD who has a history of coronary disease status post stenting of the mid right coronary artery in 2005 and stenting of the mid left circumflex in 2007. He now presents with a few months history of exertional substernal chest discomfort associated with shortness of breath. He has had no symptoms at rest. He underwent cardiac catheterization on 08/03/2012 which revealed the following: Cardiac Cath:  Procedural Findings:  Hemodynamics:  AO 131/74 with a mean of 98 mmHg  LV 138/23 mmHg  Coronary angiography:  Coronary dominance: right  Left mainstem: Normal.  Left anterior descending (LAD): There is a true bifurcation stenosis in the proximal to mid LAD up to 90% involving the LAD before and after the diagonal takeoff. The diagonal appears to be spared of significant disease.  Left circumflex (LCx): The stent in the mid circumflex is widely patent. At the distal stent margin there is a 70-80% focal stenosis.  Right coronary artery (RCA): The right coronary is a dominant vessel. In the mid vessel there is a stent with diffuse in-stent disease up to 50%. At the distal stent margin this is approximately 60-70%.  Left ventriculography: Left ventricular systolic function is normal, LVEF is estimated at 55-65%, there is no significant mitral regurgitation  Final Conclusions:  1. Three-vessel obstructive coronary disease. His most critical lesion is in the LAD at the bifurcation.  2. Normal left ventricular function. After thorough evaluation of the patient studies and examination of the patient himself, it was Dr. Laneta Simmers recommendation that he proceed with surgical  coronary artery revascularization. He was admitted this hospitalization for the procedure.  Past Medical History   Diagnosis  Date   .  HYPERTRIGLYCERIDEMIA    .  HYPERTENSION    .  HYPERLIPIDEMIA    .  HEMORRHOIDS    .  CORONARY ARTERY DISEASE      a. s/p stent to RCA 2005; b. Last LHC 12/07: Mid LAD 40%, proximal circumflex 20%, mid circumflex 95%, mid RCA stent okay, normal LV function. PCI: Taxus DES to the [facility]; c. Myoview 10/11: EF 56%, no ischemia or scar   .  SLEEP APNEA    .  OBESITY    .  GERD    .  DYSPHAGIA UNSPECIFIED    .  DIVERTICULOSIS, COLON    .  COPD    .  COLONIC POLYPS, HX OF    .  CHEST PAIN     Past Surgical History   Procedure  Date   .  Cardiac catheterization    .  Right long finger extensor tendon repair    Back surgery for lumbar disc disease in past. No current problems  Family History   Problem  Relation  Age of Onset   .  Cancer  Mother    .  Heart disease  Brother    Social History  History   Substance Use Topics   .  Smoking status:  Current Every Day Smoker -- 1.5 packs/day   .  Smokeless tobacco:  Not on file   .  Alcohol Use:  Not on file    Current Outpatient Prescriptions   Medication  Sig  Dispense  Refill   .  aspirin 325 MG tablet  Take 325 mg by mouth daily.     .  clopidogrel (PLAVIX) 75 MG tablet  Take 1 tablet (75 mg total) by mouth daily.  30 tablet  12   .  lisinopril (PRINIVIL,ZESTRIL) 10 MG tablet  Take 1 tablet (10 mg total) by mouth daily.  30 tablet  12   .  Magnesium 250 MG TABS  Take 1 tablet by mouth daily.     .  nitroGLYCERIN (NITROSTAT) 0.4 MG SL tablet  Place 1 tablet (0.4 mg total) under the tongue every 5 (five) minutes as needed for chest pain.  25 tablet  12   .  Omega-3 Fatty Acids (FISH OIL) 1200 MG CAPS  Take 1 capsule by mouth daily.     [name]  omeprazole (PRILOSEC OTC) 20 MG tablet  Take 20 mg by mouth daily.     .  pravastatin (PRAVACHOL) 20 MG tablet  Take 1 tablet (20 mg total) by mouth every evening.  30  tablet  9   No Known Allergies   Hospital Course:  The patient was admitted to the hospital and taken to the operating room on 08/18/2012 and underwent Procedure(s): OPERATIVE REPORT  PREOPERATIVE DIAGNOSIS: Severe three-vessel coronary artery disease.  POSTOPERATIVE DIAGNOSIS: Severe three-vessel coronary artery disease.  OPERATIVE PROCEDURE: Median sternotomy, extracorporeal circulation,  coronary artery bypass graft surgery x4 using a left internal mammary  artery graft to the left anterior descending coronary artery with a  saphenous vein graft to the diagonal branch of the LAD, saphenous vein  graft to the obtuse marginal branch of left circumflex coronary artery,  and a saphenous vein graft to the posterior descending branch of the  right coronary artery. Endoscopic vein harvesting from the right leg.  ATTENDING SURGEON: Evelene Croon, M.D.  ASSISTANT:Erin Barrett, PA-C.  ANESTHESIA: General endotracheal. The patient remained hemodynamically stable and was transported to the  SICU in guarded, but stable condition.   Postoperative hospital course:  The patient has maintained stable hemodynamics. He did have a short episode of paroxysmal atrial flutter and was started on a course of amiodarone. He has maintained sinus rhythm since this short episode. He was weaned from the ventilator without significant difficulties early on postoperative day 0. All routine lines, monitors and drainage devices have been discontinued in standard fashion. He does evidence a clinical bronchitis but is responding well to pulmonary toilet and Combivent. Additionally he did begin a course of oral Avelox. Sputum cultures are currently pending but chemically has improved dramatically in a short period of time with almost no production at this time. He is tolerating gradually increasing activities using standard protocols. Incisions are healing well without evidence of infection. He is a mild acute blood loss anemia  which is stable. Blood pressure has run relatively low and he has not been restarted on his ACE inhibitor at this time. He is on a beta blocker as well as a statin. Currently his status is felt to be tentatively stable for discharge in the next 1-2 days pending further ongoing reevaluation of his recovery.       Basename 08/20/12 0450  NA 132*  K 4.4  CL 98  CO2 25  GLUCOSE 134*  BUN 16  CALCIUM 8.7    Basename 08/20/12  0450  WBC 17.3*  HGB 10.8*  HCT 32.7*  PLT 127*   No results found for this basename: INR:2 in the last 72 hours   Discharge Instructions:  The patient is discharged to home with extensive instructions on wound care and progressive ambulation.  They are instructed not to drive or perform any heavy lifting until returning to see the physician in his office.  Discharge Diagnosis:  CAD Postoperative atrial flutter Acute blood loss anemia-expected, mild Secondary Diagnosis: Patient Active Problem List  Diagnosis  . HYPERTRIGLYCERIDEMIA  . HYPERLIPIDEMIA  . OBESITY  . HYPERTENSION  . CORONARY ARTERY DISEASE  . HEMORRHOIDS  . COPD  . GERD  . DIVERTICULOSIS, COLON  . Obstructive sleep apnea  . CHEST PAIN  . DYSPHAGIA UNSPECIFIED  . COLONIC POLYPS, HX OF  . Tobacco abuse   Past Medical History  Diagnosis Date  . HYPERTRIGLYCERIDEMIA   . HYPERTENSION   . HYPERLIPIDEMIA   . HEMORRHOIDS   . CORONARY ARTERY DISEASE     a. s/p stent to RCA 2005;  b. Last LHC 12/07: Mid LAD 40%, proximal circumflex 20%, mid circumflex 95%, mid RCA stent okay, normal LV function. PCI: Taxus DES to the [facility];  c.  Myoview 10/11: EF 56%, no ischemia or scar    . OBESITY   . GERD   . DYSPHAGIA UNSPECIFIED     intermittent  . DIVERTICULOSIS, COLON   . COLONIC POLYPS, HX OF   . CHEST PAIN   . SLEEP APNEA     on CPAP       [place], [place]  Home Medication Instructions [id number]   Printed on:08/22/12 1230  Medication Information                    aspirin 325  MG tablet Take 325 mg by mouth daily.            omeprazole (PRILOSEC OTC) 20 MG tablet Take 20 mg by mouth daily.           pravastatin (PRAVACHOL) 20 MG tablet Take 20 mg by mouth every evening.           amiodarone (PACERONE) 400 MG tablet Take 1 tablet (400 mg total) by mouth 2 (two) times daily. For 7 days, then 400mg  once daily           metoprolol tartrate (LOPRESSOR) 25 MG tablet Take 1 tablet (25 mg total) by mouth 2 (two) times daily.           moxifloxacin (AVELOX) 400 MG tablet Take 1 tablet (400 mg total) by mouth daily at 6 PM.           oxyCODONE (OXY IR/ROXICODONE) 5 MG immediate release tablet Take 1-2 tablets (5-10 mg total) by mouth every 4 (four) hours as needed.            Follow-up Information    Follow up with Alleen Borne, MD. (3 weeks-office will contact you. Please obtain a chest x-ray at [facility] imaging 1 hour prior to appointment to see surgeon. [facility] is located in the same office complex.)    Contact information:   [address] [place] [address] [phone]       Follow up with Charlton Haws, MD. (2 weeks-please contact the office to arrange this appointment.)    Contact information:   [address] [place] [address] [phone]  The patient will be discharged on a beta blocker as well as statin as noted above. Currently he is maintaining a blood pressure that will not tolerate an Ace inhibitor. This may be begun as an outpatient pending further followup and reevaluation. He also understands the importance of smoking cessation and is resolved to quit completely.    Disposition: For discharge home  Patient's condition is Good  Gershon Crane, PA-C 08/22/2012  12:30 PM

## 2012-08-22 NOTE — Progress Notes (Signed)
Pt ambulated in hallway 550 feet with rolling walker and standby assist. Pt tolerated well.  Jessie Caliah Kopke, RN 

## 2012-08-23 DIAGNOSIS — I2510 Atherosclerotic heart disease of native coronary artery without angina pectoris: Principal | ICD-10-CM

## 2012-08-23 LAB — CBC
MCH: 30.3 pg (ref 26.0–34.0)
MCV: 89.2 fL (ref 78.0–100.0)
Platelets: 172 10*3/uL (ref 150–400)
RDW: 11.7 % (ref 11.5–15.5)
WBC: 7.9 10*3/uL (ref 4.0–10.5)

## 2012-08-23 LAB — BASIC METABOLIC PANEL
CO2: 25 mEq/L (ref 19–32)
Calcium: 8.8 mg/dL (ref 8.4–10.5)
Creatinine, Ser: 0.96 mg/dL (ref 0.50–1.35)

## 2012-08-23 NOTE — Progress Notes (Signed)
Pt walked without RW 350 ft independently. Tolerated well. No need to order RW for pt. SaO2 95 RA.  Feels good. No questions from education. [phone] Ethelda Chick CES, ACSM

## 2012-08-23 NOTE — Progress Notes (Addendum)
5 Days Post-Op Procedure(s) (LRB): CORONARY ARTERY BYPASS GRAFTING (CABG) (N/A) Subjective:  [name] has no complaints this morning. He feels up to going home today  Objective: Vital signs in last 24 hours: Temp:  [98 F (36.7 C)-99.6 F (37.6 C)] 98.6 F (37 C) (10/28 0408) Pulse Rate:  [74-94] 74  (10/28 0408) Cardiac Rhythm:  [-] Heart block (10/27 1950) Resp:  [18-20] 18  (10/28 0408) BP: (116-161)/(55-80) 116/74 mmHg (10/28 0408) SpO2:  [93 %-99 %] 96 % (10/28 0408) Weight:  [213 lb 14.4 oz (97.024 kg)] 213 lb 14.4 oz (97.024 kg) (10/28 0408)  General appearance: alert, cooperative and no distress Heart: regular rate and rhythm Lungs: clear to auscultation bilaterally Abdomen: soft, non-tender; bowel sounds normal; no masses,  no organomegaly Extremities: edema 2+ Wound: clean and dry  Lab Results:  Basename 08/23/12 0628  WBC 7.9  HGB 9.8*  HCT 28.8*  PLT 172   BMET: No results found for this basename: NA:2,K:2,CL:2,CO2:2,GLUCOSE:2,BUN:2,CREATININE:2,CALCIUM:2 in the last 72 hours  PT/INR: No results found for this basename: LABPROT,INR in the last 72 hours ABG    Component Value Date/Time   PHART 7.370 08/18/2012 2040   HCO3 25.1* 08/18/2012 2040   TCO2 26 08/18/2012 2040   ACIDBASEDEF 3.0* 08/18/2012 1832   O2SAT 96.0 08/18/2012 2040   CBG (last 3)   Basename 08/23/12 0551 08/22/12 2039 08/22/12 1615  GLUCAP 114* 105* 118*    Assessment/Plan: S/P Procedure(s) (LRB): CORONARY ARTERY BYPASS GRAFTING (CABG) (N/A)  1. CV- NSR on Amiodarone, Lopressor 2. Pulm- off oxygen, continue IS, CPAP at home 3. Bronchitis- on Avelox 4. Volume Overload- + LE edema, weight is elevated, continue Lasix 5. Dispo- patient doing well, will d/c home today   LOS: 5 days    Raford Pitcher, ERIN 08/23/2012    Chart reviewed, patient examined, agree with above. No further atrial arrhythmias.

## 2012-08-23 NOTE — Progress Notes (Signed)
Patient ambulated with walker > 1200 ft without assistance. Tolerated well.

## 2012-08-23 NOTE — Progress Notes (Signed)
All three sutures removed, 1/2 inch steri strips were placed, and infection signs and symptoms education provided.

## 2012-08-24 NOTE — Care Management Note (Signed)
    Page 1 of 1   08/24/2012     11:57:14 AM   CARE MANAGEMENT NOTE 08/24/2012  Patient:  [name], [name]   Account Number:  [id number]  Date Initiated:  08/23/2012  Documentation initiated by:  Alva Kuenzel  Subjective/Objective Assessment:   PT S/P CABG X 4 ON 08/18/12.  PTA, PT INDEPENDENT, LIVES WITH SPOUSE.     Action/Plan:   MET WITH PT AND WIFE TO DISCUSS DC PLANS.  NO HOME NEEDS IDENTIFIED.   Anticipated DC Date:  08/23/2012   Anticipated DC Plan:  HOME/SELF CARE      DC Planning Services  CM consult      Choice offered to / List presented to:             Status of service:  Completed, signed off Medicare Important Message given?   (If response is "NO", the following Medicare IM given date fields will be blank) Date Medicare IM given:   Date Additional Medicare IM given:    Discharge Disposition:  HOME/SELF CARE  Per UR Regulation:  Reviewed for med. necessity/level of care/duration of stay  If discussed at Long Length of Stay Meetings, dates discussed:    Comments:

## 2012-09-09 ENCOUNTER — Other Ambulatory Visit: Payer: Self-pay | Admitting: Surgery

## 2012-09-09 DIAGNOSIS — Z951 Presence of aortocoronary bypass graft: Secondary | ICD-10-CM

## 2012-09-10 ENCOUNTER — Ambulatory Visit (INDEPENDENT_AMBULATORY_CARE_PROVIDER_SITE_OTHER): Payer: BC Managed Care – PPO | Admitting: Cardiovascular Disease

## 2012-09-10 ENCOUNTER — Encounter: Payer: Self-pay | Admitting: Cardiovascular Disease

## 2012-09-10 VITALS — BP 116/62 | HR 55 | Ht 69.0 in | Wt 210.8 lb

## 2012-09-10 DIAGNOSIS — I2510 Atherosclerotic heart disease of native coronary artery without angina pectoris: Secondary | ICD-10-CM

## 2012-09-10 DIAGNOSIS — I480 Paroxysmal atrial fibrillation: Secondary | ICD-10-CM

## 2012-09-10 DIAGNOSIS — I4891 Unspecified atrial fibrillation: Secondary | ICD-10-CM

## 2012-09-10 DIAGNOSIS — Z951 Presence of aortocoronary bypass graft: Secondary | ICD-10-CM

## 2012-09-10 DIAGNOSIS — Z720 Tobacco use: Secondary | ICD-10-CM

## 2012-09-10 DIAGNOSIS — I10 Essential (primary) hypertension: Secondary | ICD-10-CM

## 2012-09-10 DIAGNOSIS — F17200 Nicotine dependence, unspecified, uncomplicated: Secondary | ICD-10-CM

## 2012-09-10 DIAGNOSIS — E781 Pure hyperglyceridemia: Secondary | ICD-10-CM

## 2012-09-10 NOTE — Assessment & Plan Note (Signed)
Periop NSR with no palpitations  D/C amiodarone

## 2012-09-10 NOTE — Patient Instructions (Signed)
Your physician recommends that you schedule a follow-up appointment in:  3 MONTHS WITH DR [facility] Your physician has recommended you make the following change in your medication: STOP AMIODARONE CARDIAC REHAB

## 2012-09-10 NOTE — Assessment & Plan Note (Signed)
S/P CABG  May drive  Cardiac rehab  Continue beta blocker and ASA

## 2012-09-10 NOTE — Progress Notes (Signed)
Patient ID: [name], male   DOB: Sep 02, 1956, 56 y.o.   MRN: [id number] 56 yo seen post CABG 10/23 with Dr Laneta Simmers  CORONARY ARTERY BYPASS GRAFTING x4  LIMA to LAD  SVG to Diagonal 1  SVG to Obtuse Marginal 1  SVG to Posterior Descending  D/C on amiodarone for brief PAF  Maint NSR  Healed well Has not smoked since D/C  Going to cardiac rehab next week  F/U cholesterol 12/2  ROS: Denies fever, malais, weight loss, blurry vision, decreased visual acuity, cough, sputum, SOB, hemoptysis, pleuritic pain, palpitaitons, heartburn, abdominal pain, melena, lower extremity edema, claudication, or rash.  All other systems reviewed and negative  General: Affect appropriate Healthy:  appears stated age HEENT: normal Neck supple with no adenopathy JVP normal no bruits no thyromegaly Lungs clear with no wheezing and good diaphragmatic motion Heart:  S1/S2 no murmur, no rub, gallop or click Sternum well healed PMI normal Abdomen: benighn, BS positve, no tenderness, no AAA no bruit.  No HSM or HJR Distal pulses intact with no bruits No edema right endoscopic harvest normal Neuro non-focal Skin warm and dry No muscular weakness   Current Outpatient Prescriptions  Medication Sig Dispense Refill  . acetaminophen (TYLENOL) 650 MG CR tablet Take 650 mg by mouth every 8 (eight) hours as needed.      [name] amiodarone (PACERONE) 400 MG tablet Take 1 tablet (400 mg total) by mouth 2 (two) times daily. For 7 days, then 400mg  once daily  70 tablet  0  . aspirin 325 MG tablet Take 325 mg by mouth daily.       . metoprolol tartrate (LOPRESSOR) 25 MG tablet Take 1 tablet (25 mg total) by mouth 2 (two) times daily.  60 tablet  1  . moxifloxacin (AVELOX) 400 MG tablet Take 1 tablet (400 mg total) by mouth daily at 6 PM.  5 tablet  0  . omeprazole (PRILOSEC OTC) 20 MG tablet Take 20 mg by mouth daily.      . pravastatin (PRAVACHOL) 20 MG tablet Take 20 mg by mouth every evening.        Allergies  Review of  patient's allergies indicates no known allergies.  Electrocardiogram:  Assessment and Plan

## 2012-09-10 NOTE — Assessment & Plan Note (Signed)
Well controlled.  Continue current medications and low sodium Dash type diet.    

## 2012-09-10 NOTE — Assessment & Plan Note (Signed)
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

## 2012-09-10 NOTE — Assessment & Plan Note (Signed)
Cholesterol is at goal.  Continue current dose of statin and diet Rx.  No myalgias or side effects.  F/U  LFT's in 6 months. No results found for this basename: LDLCALC   Labs next week

## 2012-09-13 ENCOUNTER — Ambulatory Visit
Admission: RE | Admit: 2012-09-13 | Discharge: 2012-09-13 | Disposition: A | Discharge status: Home / Self Care | Payer: BC Managed Care – PPO | Source: Ambulatory Visit | Attending: Surgery | Admitting: Surgery

## 2012-09-13 ENCOUNTER — Ambulatory Visit (INDEPENDENT_AMBULATORY_CARE_PROVIDER_SITE_OTHER): Payer: Self-pay | Admitting: Physician Assistant

## 2012-09-13 VITALS — BP 133/77 | HR 67 | Resp 18 | Ht 69.0 in | Wt 210.0 lb

## 2012-09-13 DIAGNOSIS — I359 Nonrheumatic aortic valve disorder, unspecified: Secondary | ICD-10-CM

## 2012-09-13 DIAGNOSIS — Z951 Presence of aortocoronary bypass graft: Secondary | ICD-10-CM

## 2012-09-13 NOTE — Progress Notes (Signed)
  HPI:  Patient returns for routine postoperative follow-up having undergone a CABG x 4 08/18/2012 The patient's early postoperative recovery while in the hospital was notable for atrial fibrillation. Since hospital discharge the patient reports he has some soreness around incision. He denies shortness of breath or chest pain.   Current Outpatient Prescriptions  Medication Sig Dispense Refill  . acetaminophen (TYLENOL) 650 MG CR tablet Take 650 mg by mouth every 8 (eight) hours as needed.      [name] aspirin 325 MG tablet Take 325 mg by mouth daily.       . metoprolol tartrate (LOPRESSOR) 25 MG tablet Take 1 tablet (25 mg total) by mouth 2 (two) times daily.  60 tablet  1  . omeprazole (PRILOSEC OTC) 20 MG tablet Take 20 mg by mouth daily.      . pravastatin (PRAVACHOL) 20 MG tablet Take 20 mg by mouth every evening.      Vital signs: BP 133/77, HR 67, RR 18, O2 saturation 95% on room air  Physical Exam: Cardiovascular:RRR; no murmur, gallop, or rub. Pulmonary:Clear to auscultation bilaterally Abdomen:Soft, non tender, bowel sounds present Extremities:No lower extremity edema Wounds:Clean and dry  Diagnostic Tests: PA/LAT CXR shows improved aeration, trace left pleural effusion, and no pneumothorax  Impression and Plan: Overall, surgically stable. He is maintaining sinus rhythm. His Amiodarone was recently stopped by Dr. Eden Emms. He was seen in his office this past Friday.  He was instructed Provided he is no longer taking narcotics, he may begin driving short distances i.e. 30 minutes or less during the day for the next week. He may then increase his frequency and duration as tolerates. He is to continue with sternal precautions i.e. No lifting more than 10 pounds for the next 4 weeks. He was encouraged to participate in cardiac rehab. Finally, he was instructed he may not return to work until after November 18, 2012. He will continued to be followed by Dr. Eden Emms and PRN by Dr.  Laneta Simmers.

## 2012-09-14 ENCOUNTER — Ambulatory Visit: Payer: BC Managed Care – PPO | Admitting: Surgery

## 2012-09-27 ENCOUNTER — Other Ambulatory Visit (INDEPENDENT_AMBULATORY_CARE_PROVIDER_SITE_OTHER): Payer: BC Managed Care – PPO

## 2012-09-27 DIAGNOSIS — E785 Hyperlipidemia, unspecified: Secondary | ICD-10-CM

## 2012-09-27 DIAGNOSIS — I2510 Atherosclerotic heart disease of native coronary artery without angina pectoris: Secondary | ICD-10-CM

## 2012-09-27 DIAGNOSIS — I10 Essential (primary) hypertension: Secondary | ICD-10-CM

## 2012-09-27 DIAGNOSIS — Z720 Tobacco use: Secondary | ICD-10-CM

## 2012-09-27 DIAGNOSIS — R079 Chest pain, unspecified: Secondary | ICD-10-CM

## 2012-09-27 LAB — LIPID PANEL
Cholesterol: 165 mg/dL (ref 0–200)
LDL Cholesterol: 97 mg/dL (ref 0–99)
Triglycerides: 104 mg/dL (ref 0.0–149.0)
VLDL: 20.8 mg/dL (ref 0.0–40.0)

## 2012-09-27 LAB — HEPATIC FUNCTION PANEL
Albumin: 3.9 g/dL (ref 3.5–5.2)
Total Protein: 7.3 g/dL (ref 6.0–8.3)

## 2012-09-28 ENCOUNTER — Telehealth: Payer: Self-pay | Admitting: *Deleted

## 2012-09-28 NOTE — Telephone Encounter (Signed)
Ok to return to work

## 2012-09-28 NOTE — Telephone Encounter (Signed)
Message copied by Tarri Fuller on Tue Sep 28, 2012  9:32 AM ------      Message from: [place], [place] T      Created: Tue Sep 28, 2012  8:27 AM       Lipids ok.      Continue with current treatment plan.      Tereso Newcomer, PA-C  8:26 AM 09/28/2012

## 2012-09-28 NOTE — Telephone Encounter (Signed)
pt notified about lab results w/verbal understanding today. pt asked if ok to go back to work 11/18/12. said I will have Dr. Fabio Bering nurse Wynona Canes  to have her s/w Dr. Eden Emms since he saw pt on 09/10/12 if ok to rtn to work. pt said ok

## 2012-09-29 ENCOUNTER — Encounter: Payer: Self-pay | Admitting: *Deleted

## 2012-09-29 NOTE — Telephone Encounter (Signed)
Pt notified ok per Dr. Eden Emms to return back to work. Work note done today to return back to work on 10/04/12 Monday. Pt aware note at front desk and will pick up note later today. Pt said thank you.

## 2012-09-30 ENCOUNTER — Encounter (HOSPITAL_COMMUNITY)
Admission: RE | Admit: 2012-09-30 | Discharge: 2012-09-30 | Disposition: A | Discharge status: Home / Self Care | Payer: BC Managed Care – PPO | Source: Ambulatory Visit | Attending: Cardiovascular Disease | Admitting: Cardiovascular Disease

## 2012-09-30 DIAGNOSIS — Z5189 Encounter for other specified aftercare: Secondary | ICD-10-CM | POA: Insufficient documentation

## 2012-09-30 DIAGNOSIS — I4892 Unspecified atrial flutter: Secondary | ICD-10-CM | POA: Insufficient documentation

## 2012-09-30 DIAGNOSIS — I10 Essential (primary) hypertension: Secondary | ICD-10-CM | POA: Insufficient documentation

## 2012-09-30 DIAGNOSIS — E785 Hyperlipidemia, unspecified: Secondary | ICD-10-CM | POA: Insufficient documentation

## 2012-09-30 DIAGNOSIS — I2510 Atherosclerotic heart disease of native coronary artery without angina pectoris: Secondary | ICD-10-CM | POA: Insufficient documentation

## 2012-09-30 NOTE — Progress Notes (Signed)
Cardiac Rehab Medication Review by a Pharmacist  Does the patient  feel that his/her medications are working for him/her?  yes  Has the patient been experiencing any side effects to the medications prescribed?  no  Does the patient measure his/her own blood pressure or blood glucose at home?  no   Does the patient have any problems obtaining medications due to transportation or finances?   no  Understanding of regimen: excellent Understanding of indications: excellent Potential of compliance: excellent    Pharmacist comments: none    Laurence Slate 09/30/2012 9:29 AM

## 2012-10-04 ENCOUNTER — Encounter (HOSPITAL_COMMUNITY): Payer: Self-pay

## 2012-10-04 ENCOUNTER — Encounter (HOSPITAL_COMMUNITY)
Admission: RE | Admit: 2012-10-04 | Discharge: 2012-10-04 | Disposition: A | Discharge status: Home / Self Care | Payer: BC Managed Care – PPO | Source: Ambulatory Visit | Attending: Cardiovascular Disease | Admitting: Cardiovascular Disease

## 2012-10-04 NOTE — Progress Notes (Signed)
Pt started cardiac rehab today.  Pt tolerated light exercise without difficulty.  Asymptomatic.  VSS, telemetry-sinus rhythm with occasional PVC.  Pt oriented to exercise equipment and routine.  Understanding verbalized.

## 2012-10-06 ENCOUNTER — Encounter (HOSPITAL_COMMUNITY)
Admission: RE | Admit: 2012-10-06 | Discharge: 2012-10-06 | Disposition: A | Discharge status: Home / Self Care | Payer: BC Managed Care – PPO | Source: Ambulatory Visit | Attending: Cardiovascular Disease | Admitting: Cardiovascular Disease

## 2012-10-08 ENCOUNTER — Encounter (HOSPITAL_COMMUNITY)
Admission: RE | Admit: 2012-10-08 | Discharge: 2012-10-08 | Disposition: A | Discharge status: Home / Self Care | Payer: BC Managed Care – PPO | Source: Ambulatory Visit | Attending: Cardiovascular Disease | Admitting: Cardiovascular Disease

## 2012-10-11 ENCOUNTER — Encounter (HOSPITAL_COMMUNITY)
Admission: RE | Admit: 2012-10-11 | Discharge: 2012-10-11 | Disposition: A | Discharge status: Home / Self Care | Payer: BC Managed Care – PPO | Source: Ambulatory Visit | Attending: Cardiovascular Disease | Admitting: Cardiovascular Disease

## 2012-10-13 ENCOUNTER — Encounter (HOSPITAL_COMMUNITY)
Admission: RE | Admit: 2012-10-13 | Discharge: 2012-10-13 | Disposition: A | Discharge status: Home / Self Care | Payer: BC Managed Care – PPO | Source: Ambulatory Visit | Attending: Cardiovascular Disease | Admitting: Cardiovascular Disease

## 2012-10-15 ENCOUNTER — Encounter (HOSPITAL_COMMUNITY)
Admission: RE | Admit: 2012-10-15 | Discharge: 2012-10-15 | Disposition: A | Discharge status: Home / Self Care | Payer: BC Managed Care – PPO | Source: Ambulatory Visit | Attending: Cardiovascular Disease | Admitting: Cardiovascular Disease

## 2012-10-18 ENCOUNTER — Encounter (HOSPITAL_COMMUNITY)
Admission: RE | Admit: 2012-10-18 | Discharge: 2012-10-18 | Disposition: A | Discharge status: Home / Self Care | Payer: BC Managed Care – PPO | Source: Ambulatory Visit | Attending: Cardiovascular Disease | Admitting: Cardiovascular Disease

## 2012-10-22 ENCOUNTER — Encounter (HOSPITAL_COMMUNITY)
Admission: RE | Admit: 2012-10-22 | Discharge: 2012-10-22 | Disposition: A | Discharge status: Home / Self Care | Payer: BC Managed Care – PPO | Source: Ambulatory Visit | Attending: Cardiovascular Disease | Admitting: Cardiovascular Disease

## 2012-10-25 ENCOUNTER — Encounter (HOSPITAL_COMMUNITY)
Admission: RE | Admit: 2012-10-25 | Discharge: 2012-10-25 | Disposition: A | Discharge status: Home / Self Care | Payer: BC Managed Care – PPO | Source: Ambulatory Visit | Attending: Cardiovascular Disease | Admitting: Cardiovascular Disease

## 2012-10-25 NOTE — Progress Notes (Signed)
Pt discontinued his participation in Cardiac Rehab Phase II with the completion of 9 sessions.  Pt opted to discharge early due to the restart of insurance benefits along with deductibles and out of pocket expenses for the 10/2012.  Medication list reconciled.  Pt plans to continue his home exercise with walking.

## 2012-10-27 DIAGNOSIS — I4891 Unspecified atrial fibrillation: Secondary | ICD-10-CM

## 2012-10-27 HISTORY — DX: Unspecified atrial fibrillation: I48.91

## 2012-12-14 ENCOUNTER — Ambulatory Visit (INDEPENDENT_AMBULATORY_CARE_PROVIDER_SITE_OTHER): Payer: BC Managed Care – PPO | Admitting: Cardiovascular Disease

## 2012-12-14 ENCOUNTER — Encounter: Payer: Self-pay | Admitting: Cardiovascular Disease

## 2012-12-14 VITALS — BP 140/78 | HR 80 | Ht 70.0 in | Wt 234.0 lb

## 2012-12-14 DIAGNOSIS — E785 Hyperlipidemia, unspecified: Secondary | ICD-10-CM

## 2012-12-14 DIAGNOSIS — I4891 Unspecified atrial fibrillation: Secondary | ICD-10-CM

## 2012-12-14 DIAGNOSIS — I10 Essential (primary) hypertension: Secondary | ICD-10-CM

## 2012-12-14 DIAGNOSIS — I2510 Atherosclerotic heart disease of native coronary artery without angina pectoris: Secondary | ICD-10-CM

## 2012-12-14 DIAGNOSIS — I480 Paroxysmal atrial fibrillation: Secondary | ICD-10-CM

## 2012-12-14 NOTE — Patient Instructions (Addendum)
Your physician wants you to follow-up in:  6 MONTHS WITH DR NISHAN  You will receive a reminder letter in the mail two months in advance. If you don't receive a letter, please call our office to schedule the follow-up appointment. Your physician recommends that you continue on your current medications as directed. Please refer to the Current Medication list given to you today. 

## 2012-12-14 NOTE — Assessment & Plan Note (Signed)
Cholesterol is at goal.  Continue current dose of statin and diet Rx.  No myalgias or side effects.  F/U  LFT's in 6 months. Lab Results  Component Value Date   LDLCALC 97 09/27/2012

## 2012-12-14 NOTE — Assessment & Plan Note (Signed)
Well controlled.  Continue current medications and low sodium Dash type diet.    

## 2012-12-14 NOTE — Assessment & Plan Note (Signed)
Stable with no angina and good activity level.  Continue medical Rx  

## 2012-12-14 NOTE — Assessment & Plan Note (Signed)
Maint NSR with no palpitatoins

## 2012-12-14 NOTE — Progress Notes (Signed)
Patient ID: [name], male   DOB: Nov 03, 1955, 57 y.o.   MRN: [id number] 57 yo seen post CABG 10/23 with Dr Laneta Simmers  CORONARY ARTERY BYPASS GRAFTING x4  LIMA to LAD  SVG to Diagonal 1  SVG to Obtuse Marginal 1  SVG to Posterior Descending   D/C on amiodarone for brief PAF Maint NSR Healed well Has not smoked since D/C Going to cardiac rehab next week F/U cholesterol 12/2 Some numbness in hands ? From statin   ROS: Denies fever, malais, weight loss, blurry vision, decreased visual acuity, cough, sputum, SOB, hemoptysis, pleuritic pain, palpitaitons, heartburn, abdominal pain, melena, lower extremity edema, claudication, or rash.  All other systems reviewed and negative  General: Affect appropriate Healthy:  appears stated age HEENT: normal Neck supple with no adenopathy JVP normal no bruits no thyromegaly Lungs clear with no wheezing and good diaphragmatic motion Heart:  S1/S2 no murmur, no rub, gallop or click PMI normal Abdomen: benighn, BS positve, no tenderness, no AAA no bruit.  No HSM or HJR Distal pulses intact with no bruits No edema Neuro non-focal Skin warm and dry No muscular weakness   Current Outpatient Prescriptions  Medication Sig Dispense Refill  . aspirin 325 MG tablet Take 325 mg by mouth daily.       . metoprolol tartrate (LOPRESSOR) 25 MG tablet Take 1 tablet (25 mg total) by mouth 2 (two) times daily.  60 tablet  1  . pravastatin (PRAVACHOL) 20 MG tablet Take 20 mg by mouth every evening.       No current facility-administered medications for this visit.    Allergies  Zocor  Electrocardiogram:  Assessment and Plan

## 2012-12-15 ENCOUNTER — Telehealth: Payer: Self-pay | Admitting: Internal Medicine

## 2012-12-15 DIAGNOSIS — G4733 Obstructive sleep apnea (adult) (pediatric): Secondary | ICD-10-CM

## 2012-12-15 NOTE — Telephone Encounter (Signed)
I spoke with spouse. She stated pt saw cardiology yesterday. Pt had open heart surgery 08/18/12 and stopped smoking so suggestion was made that his CPAP pressure made need to be lowered. Spouse/pt thinks so as well. Please advise Dr. Maple Hudson thanks Last OV 06/24/12 No pending APPT

## 2012-12-15 NOTE — Telephone Encounter (Signed)
Order- DME Advanced Reduce CPAP from 10 to 9 cwp per patient request.

## 2012-12-16 NOTE — Telephone Encounter (Signed)
Spoke with the pt's spouse and notified of recs per CDY Order was sent to [facility] to have the CPAP pressure lowered Nothing further needed per pt

## 2013-02-25 ENCOUNTER — Telehealth: Payer: Self-pay | Admitting: Cardiovascular Disease

## 2013-02-25 NOTE — Telephone Encounter (Signed)
PT COMPLAINING  OF FINGER NUMBNESS  MORE SO IN RIGHT THAN LEFT   FOR APPROX A MONTH  ALSO  THINKS  METOPROLOL IS THE CULPRIT OF HIS WEIGHT GAIN  SINCE BYPASS  IS  ACTIVE WITH JOB PER PT   HAS WALKED APRROX 10 MILES TODAY  AT WORK  IS WANTING TO  TRY ANOTHER MED  PT AWARE WILL FORWARD TO DR Eden Emms FOR REVIEW .[name]

## 2013-02-25 NOTE — Telephone Encounter (Signed)
D/C metroprolol and start cozaar 25 mg

## 2013-02-25 NOTE — Telephone Encounter (Signed)
New problem    Daughter calling   At work now .   C/O weight gain since his surgery . This am having muscle pain. Think his weight gain coming from metoprolol 25 mg.

## 2013-03-01 MED ORDER — LOSARTAN POTASSIUM 25 MG PO TABS: 25.0000 mg | ORAL_TABLET | Freq: Every day | ORAL | Status: DC

## 2013-03-01 NOTE — Telephone Encounter (Signed)
PT AWARE OF MED CHANGE./CY 

## 2013-07-26 ENCOUNTER — Encounter: Payer: Self-pay | Admitting: Cardiovascular Disease

## 2013-07-26 ENCOUNTER — Ambulatory Visit (INDEPENDENT_AMBULATORY_CARE_PROVIDER_SITE_OTHER): Payer: BC Managed Care – PPO | Admitting: Cardiovascular Disease

## 2013-07-26 VITALS — BP 145/89 | HR 82 | Ht 70.0 in | Wt 243.6 lb

## 2013-07-26 DIAGNOSIS — I4891 Unspecified atrial fibrillation: Secondary | ICD-10-CM

## 2013-07-26 DIAGNOSIS — I480 Paroxysmal atrial fibrillation: Secondary | ICD-10-CM

## 2013-07-26 DIAGNOSIS — F17200 Nicotine dependence, unspecified, uncomplicated: Secondary | ICD-10-CM

## 2013-07-26 DIAGNOSIS — I10 Essential (primary) hypertension: Secondary | ICD-10-CM

## 2013-07-26 DIAGNOSIS — I2510 Atherosclerotic heart disease of native coronary artery without angina pectoris: Secondary | ICD-10-CM

## 2013-07-26 DIAGNOSIS — E785 Hyperlipidemia, unspecified: Secondary | ICD-10-CM

## 2013-07-26 DIAGNOSIS — Z720 Tobacco use: Secondary | ICD-10-CM

## 2013-07-26 NOTE — Assessment & Plan Note (Signed)
Cholesterol is at goal.  Continue current dose of statin and diet Rx.  No myalgias or side effects.  F/U  LFT's in 6 months. Lab Results  Component Value Date   LDLCALC 97 09/27/2012   Intolerant to statins

## 2013-07-26 NOTE — Assessment & Plan Note (Signed)
Well controlled.  Continue current medications and low sodium Dash type diet.    

## 2013-07-26 NOTE — Assessment & Plan Note (Signed)
Stable with no angina and good activity level.  Continue medical Rx  

## 2013-07-26 NOTE — Patient Instructions (Addendum)
Your physician wants you to follow-up in:  6 months. You will receive a reminder letter in the mail two months in advance. If you don't receive a letter, please call our office to schedule the follow-up appointment.   

## 2013-07-26 NOTE — Progress Notes (Signed)
Patient ID: [name], male   DOB: Mar 25, 1956, 57 y.o.   MRN: [id number] 57 yo seen post CABG 10/23 with Dr Laneta Simmers  CORONARY ARTERY BYPASS GRAFTING x4  LIMA to LAD  SVG to Diagonal 1  SVG to Obtuse Marginal 1  SVG to Posterior Descending  D/C on amiodarone for brief PAF Maint NSR Healed well Has not smoked since D/C Going to cardiac rehab next week F/U cholesterol 12/2    Some numbness in hands ? From statin  Last LDL 97 off Zocor  Intolerant to other statins as well.  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  All other systems reviewed and negative  General: Affect appropriate Overweight white male  HEENT: normal Neck supple with no adenopathy JVP normal no bruits no thyromegaly Lungs clear with no wheezing and good diaphragmatic motion Heart:  S1/S2 no murmur, no rub, gallop or click PMI normal Abdomen: benighn, BS positve, no tenderness, no AAA no bruit.  No HSM or HJR Distal pulses intact with no bruits No edema Neuro non-focal Skin warm and dry No muscular weakness   Current Outpatient Prescriptions  Medication Sig Dispense Refill  . aspirin 325 MG tablet Take 325 mg by mouth daily.       [name] losartan (COZAAR) 25 MG tablet Take 1 tablet (25 mg total) by mouth daily.  90 tablet  3   No current facility-administered medications for this visit.    Allergies  Zocor  Electrocardiogram:  Sr rate 82 normal ECG   Assessment and Plan

## 2013-07-26 NOTE — Assessment & Plan Note (Signed)
Maint NSR ASA no palpitations

## 2013-11-09 IMAGING — CR DG CHEST 2V
2 series · 2 of 2 positions shown · non-contrast
Comparison: 08/13/2010

CLINICAL DATA: Preop cardiac surgery

CHEST - 2 VIEW

[view not recorded (1 of 2)]
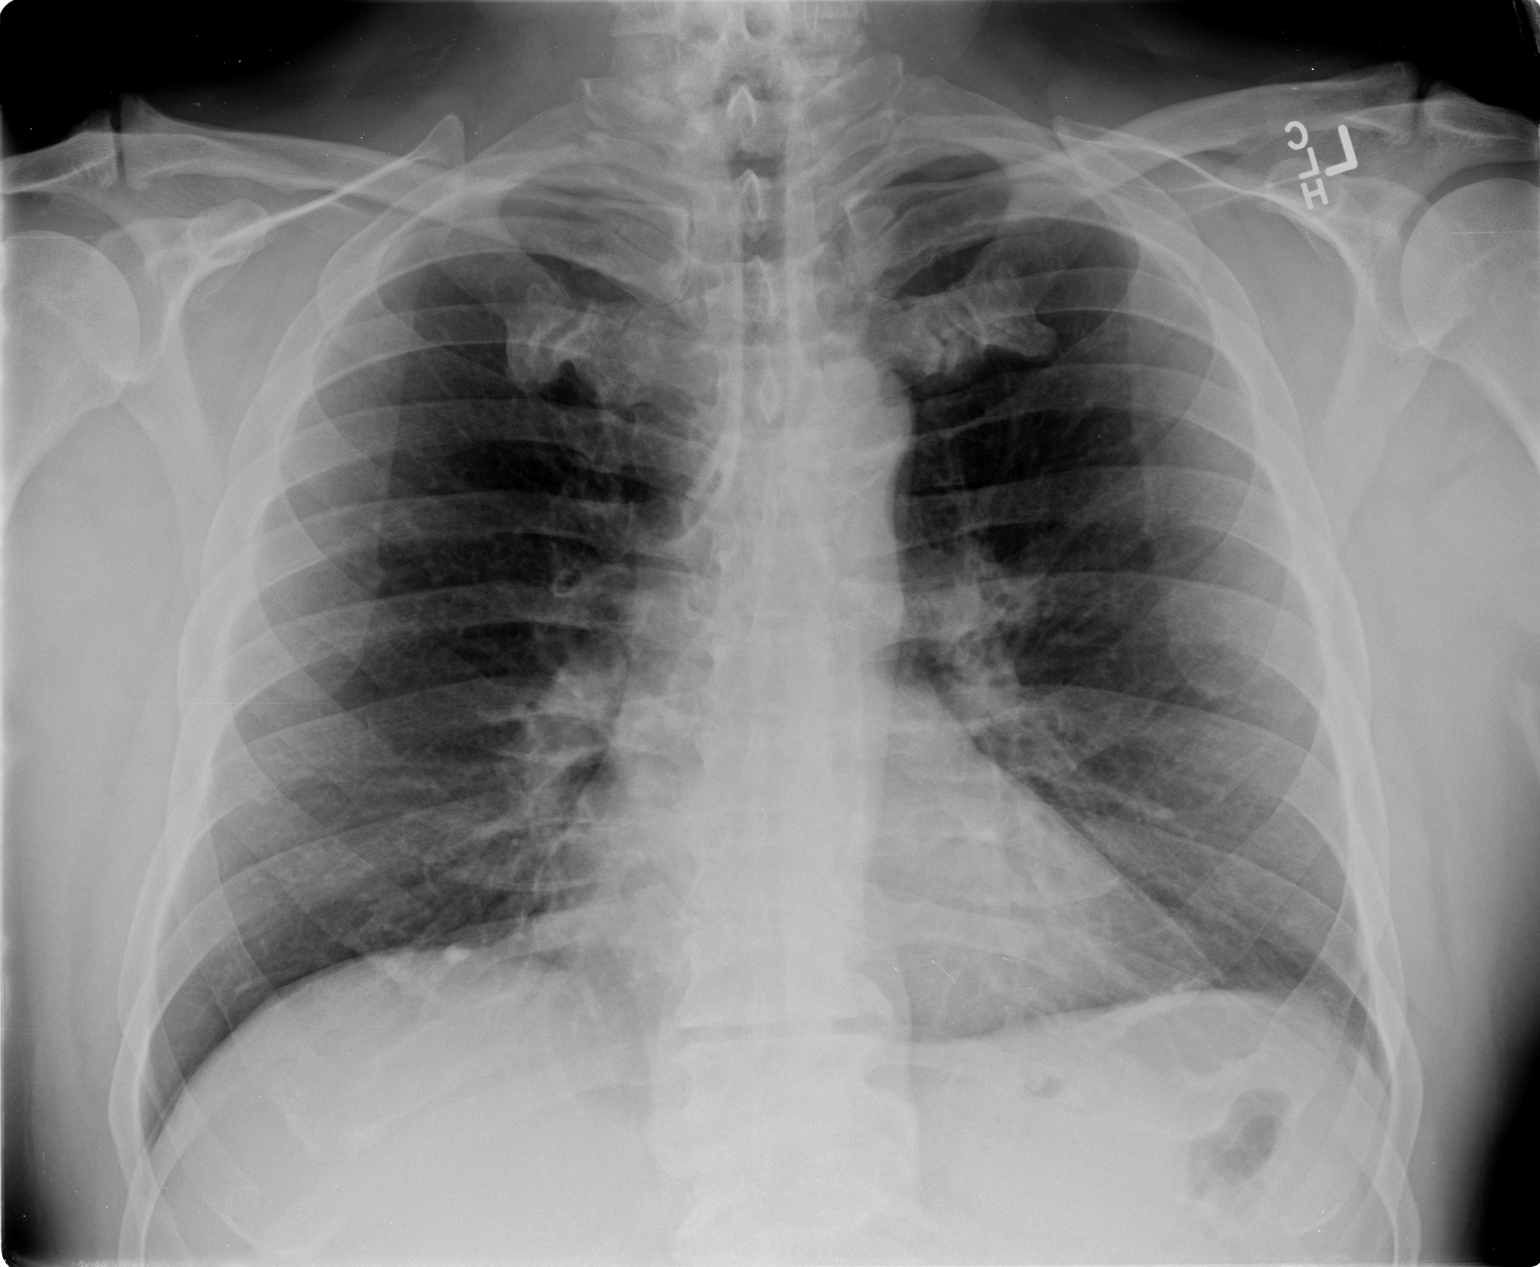

[view not recorded (2 of 2)]
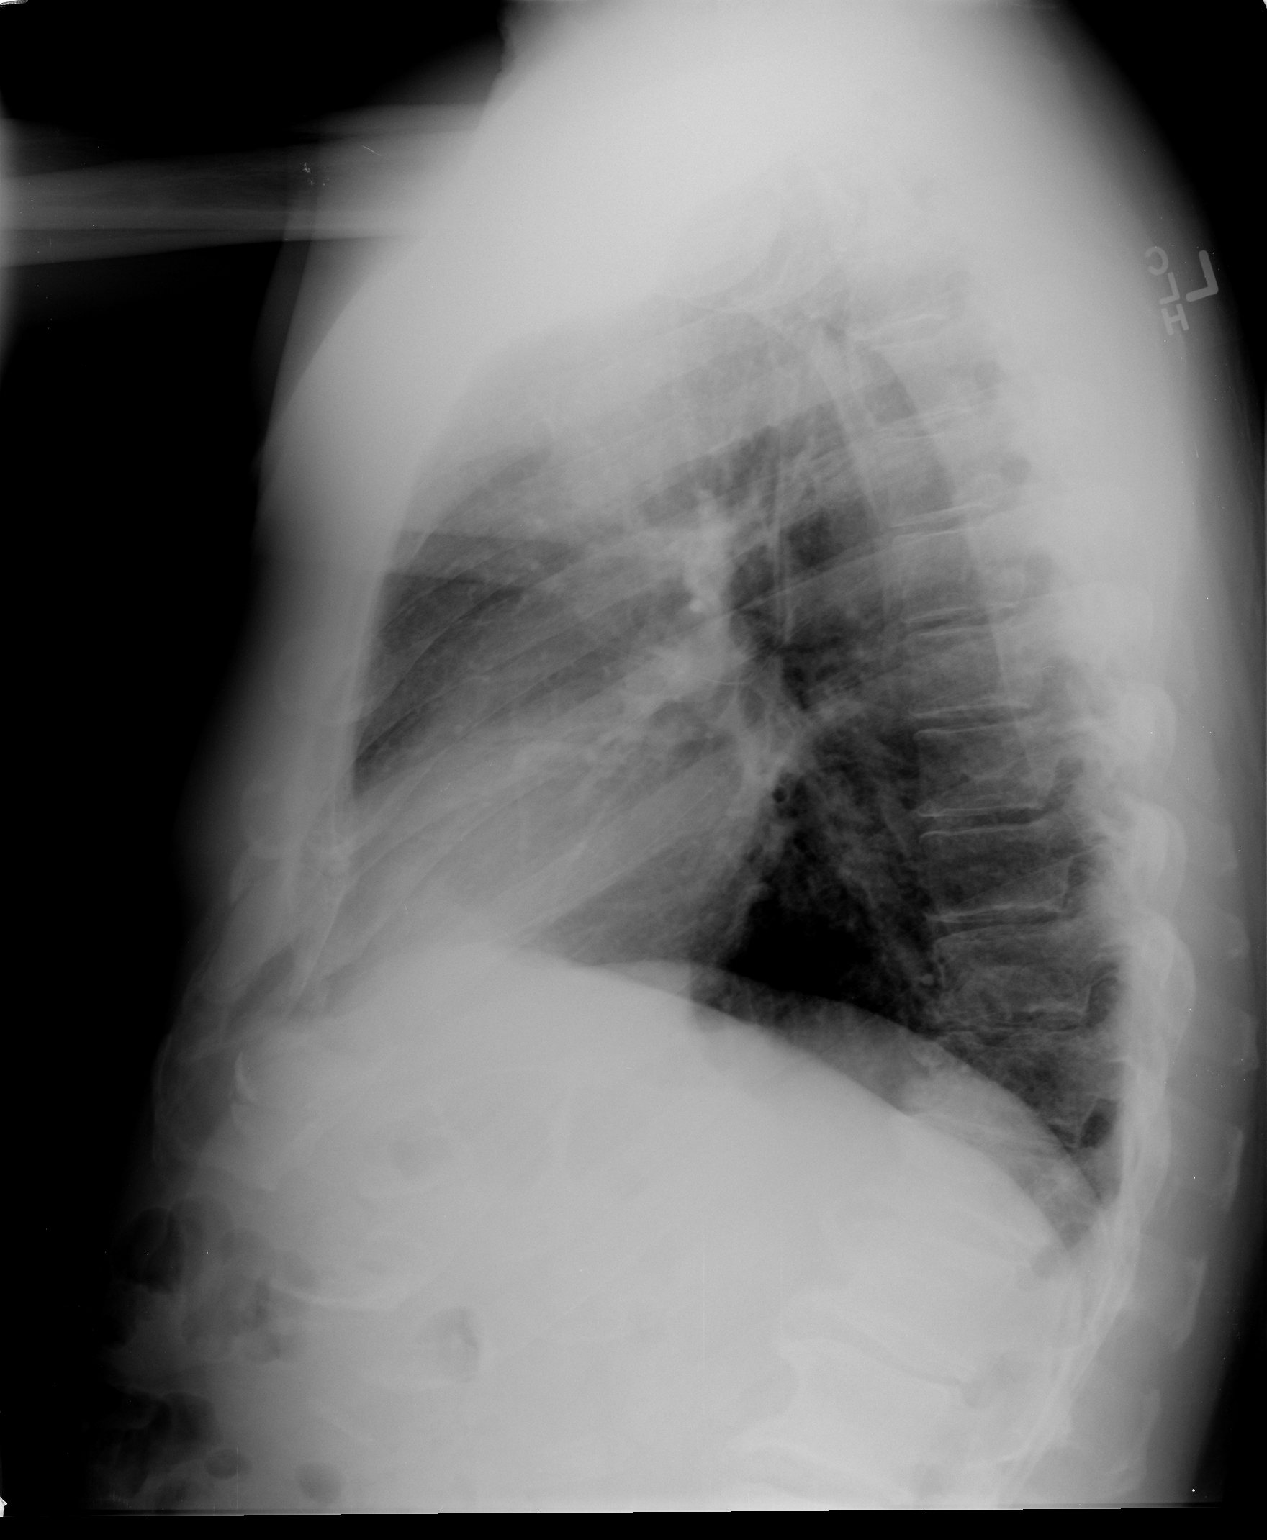

[2 of 2 positions shown; findings below may reference images not displayed]

FINDINGS: Lungs are clear. No pleural effusion or pneumothorax.

Cardiomediastinal silhouette is within normal limits.

Degenerative changes of the visualized thoracolumbar spine.  Mild
compression deformities of two lower thoracic vertebral bodies.
IMPRESSION: No evidence of acute cardiopulmonary disease.

## 2013-11-12 IMAGING — CR DG CHEST 1V PORT
1 series · 1 of 1 positions shown · non-contrast
Comparison: Yesterday

CLINICAL DATA: Abdominal aortic aneurysm repair

PORTABLE CHEST - 1 VIEW

[AP]
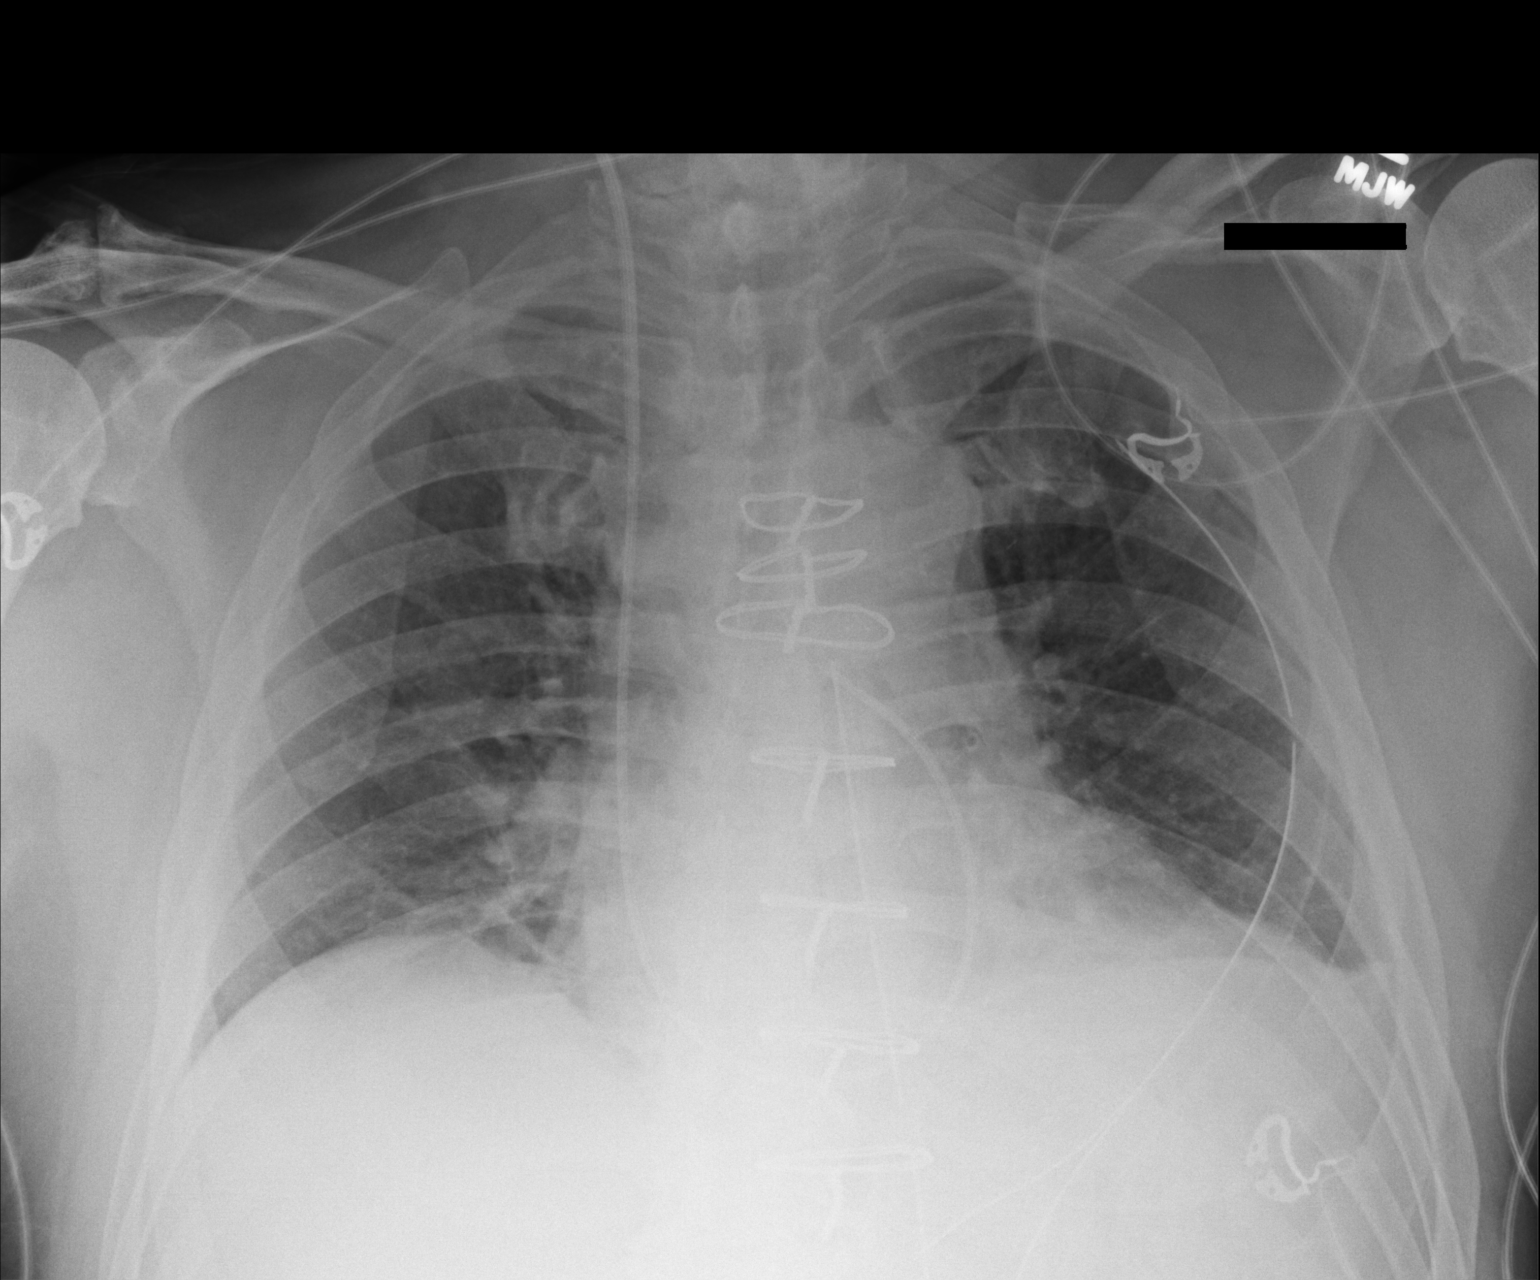

[1 of 1 positions shown; findings below may reference images not displayed]

FINDINGS: Right internal jugular vein Swan-Ganz catheter, chest
tubes are stable.  Endotracheal and NG tubes removed.  Increased
bibasilar atelectasis.  No pneumothorax.  Normal vascularity.
IMPRESSION: Extubated with increased bibasilar atelectasis.

No evidence of pneumothorax or CHF.

## 2014-04-05 ENCOUNTER — Ambulatory Visit: Payer: Self-pay

## 2014-04-05 ENCOUNTER — Other Ambulatory Visit: Payer: Self-pay | Admitting: Occupational Medicine

## 2014-04-05 DIAGNOSIS — M549 Dorsalgia, unspecified: Secondary | ICD-10-CM

## 2014-05-02 ENCOUNTER — Other Ambulatory Visit: Payer: Self-pay | Admitting: *Deleted

## 2014-05-02 MED ORDER — LOSARTAN POTASSIUM 25 MG PO TABS: 25.0000 mg | ORAL_TABLET | Freq: Every day | ORAL | Status: DC

## 2014-08-07 ENCOUNTER — Other Ambulatory Visit: Payer: Self-pay

## 2014-08-07 MED ORDER — LOSARTAN POTASSIUM 25 MG PO TABS: 25.0000 mg | ORAL_TABLET | Freq: Every day | ORAL | Status: DC

## 2014-09-24 NOTE — Progress Notes (Signed)
Patient ID: [name], male   DOB: 12/27/1955, 58 y.o.   MRN: [id number] 58 yo seen post CABG 08/18/13  with Dr Cyndia Bent  CORONARY ARTERY BYPASS GRAFTING x4  LIMA to LAD  SVG to Diagonal 1  SVG to Obtuse Marginal 1  SVG to Posterior Descending  D/C on amiodarone for brief PAF Maint NSR Healed well Has not smoked since D/C     Some numbness in hands ? 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  All other systems reviewed and negative  General: Affect appropriate Healthy:  appears stated age 5: normal Neck supple with no adenopathy JVP normal no bruits no thyromegaly Lungs clear with no wheezing and good diaphragmatic motion Heart:  S1/S2 no murmur, no rub, gallop or click PMI normal Abdomen: benighn, BS positve, no tenderness, no AAA no bruit.  No HSM or HJR Distal pulses intact with no bruits No edema Neuro non-focal Skin warm and dry No muscular weakness   Current Outpatient Prescriptions  Medication Sig Dispense Refill  . aspirin 325 MG tablet Take 325 mg by mouth daily.     [name] losartan (COZAAR) 25 MG tablet Take 1 tablet (25 mg total) by mouth daily. (Patient not taking: Reported on 09/25/2014) 15 tablet 0   No current facility-administered medications for this visit.    Allergies  Zocor  Electrocardiogram:  9/14  SR rate 82 normal ECG  Today SR rate 71 normal   Assessment and Plan

## 2014-09-25 ENCOUNTER — Encounter: Payer: Self-pay | Admitting: Cardiovascular Disease

## 2014-09-25 ENCOUNTER — Ambulatory Visit (INDEPENDENT_AMBULATORY_CARE_PROVIDER_SITE_OTHER): Payer: BC Managed Care – PPO | Admitting: Cardiovascular Disease

## 2014-09-25 VITALS — BP 132/82 | HR 71 | Ht 70.0 in | Wt 222.4 lb

## 2014-09-25 DIAGNOSIS — E785 Hyperlipidemia, unspecified: Secondary | ICD-10-CM

## 2014-09-25 DIAGNOSIS — J410 Simple chronic bronchitis: Secondary | ICD-10-CM

## 2014-09-25 DIAGNOSIS — Z0000 Encounter for general adult medical examination without abnormal findings: Secondary | ICD-10-CM

## 2014-09-25 DIAGNOSIS — I10 Essential (primary) hypertension: Secondary | ICD-10-CM

## 2014-09-25 MED ORDER — LOSARTAN POTASSIUM 25 MG PO TABS: 25.0000 mg | ORAL_TABLET | Freq: Every day | ORAL | Status: DC

## 2014-09-25 NOTE — Assessment & Plan Note (Signed)
Discussed smoking cessation again  Yearly CXR no active wheezing

## 2014-09-25 NOTE — Assessment & Plan Note (Signed)
Will check labs this week and encourage him to start Rx if LDL over 100 for secondary prevention

## 2014-09-25 NOTE — Patient Instructions (Signed)
Your physician wants you to follow-up in: [place] will receive a reminder letter in the mail two months in advance. If you don't receive a letter, please call our office to schedule the follow-up appointment. Your physician recommends that you continue on your current medications as directed. Please refer to the Current Medication list given to you today. Your physician has requested that you have an exercise tolerance test. For further information please visit [email or web address]. Please also follow instruction sheet, as given.  THIS WEEK  DOT  PHYSICAL   Your physician recommends that you return for lab work in: [place]

## 2014-09-25 NOTE — Assessment & Plan Note (Signed)
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

## 2014-09-25 NOTE — Assessment & Plan Note (Signed)
Well controlled.  Continue current medications and low sodium Dash type diet.    

## 2014-09-29 ENCOUNTER — Other Ambulatory Visit: Payer: BC Managed Care – PPO

## 2014-10-06 ENCOUNTER — Ambulatory Visit (INDEPENDENT_AMBULATORY_CARE_PROVIDER_SITE_OTHER): Payer: BC Managed Care – PPO | Admitting: Nurse Practitioner

## 2014-10-06 ENCOUNTER — Encounter: Payer: Self-pay | Admitting: Nurse Practitioner

## 2014-10-06 ENCOUNTER — Other Ambulatory Visit (INDEPENDENT_AMBULATORY_CARE_PROVIDER_SITE_OTHER): Payer: BC Managed Care – PPO | Admitting: *Deleted

## 2014-10-06 VITALS — BP 143/82 | HR 83

## 2014-10-06 DIAGNOSIS — I25810 Atherosclerosis of coronary artery bypass graft(s) without angina pectoris: Secondary | ICD-10-CM

## 2014-10-06 DIAGNOSIS — Z0000 Encounter for general adult medical examination without abnormal findings: Secondary | ICD-10-CM

## 2014-10-06 LAB — HEPATIC FUNCTION PANEL
ALBUMIN: 4 g/dL (ref 3.5–5.2)
ALT: 26 U/L (ref 0–53)
AST: 25 U/L (ref 0–37)
Alkaline Phosphatase: 44 U/L (ref 39–117)
Bilirubin, Direct: 0 mg/dL (ref 0.0–0.3)
TOTAL PROTEIN: 6.8 g/dL (ref 6.0–8.3)
Total Bilirubin: 0.8 mg/dL (ref 0.2–1.2)

## 2014-10-06 LAB — LIPID PANEL
CHOL/HDL RATIO: 5
Cholesterol: 170 mg/dL (ref 0–200)
HDL: 37.3 mg/dL — ABNORMAL LOW (ref >39.00)
LDL CALC: 107 mg/dL — AB (ref 0–99)
NonHDL: 132.7
TRIGLYCERIDES: 130 mg/dL (ref 0.0–149.0)
VLDL: 26 mg/dL (ref 0.0–40.0)

## 2014-10-06 NOTE — Progress Notes (Signed)
Exercise Treadmill Test  Pre-Exercise Testing Evaluation Rhythm: normal sinus  Rate: 75 bpm     Test  Exercise Tolerance Test Ordering MD: Jenkins Rouge, MD  Interpreting MD: Truitt Merle, NP  Unique Test No: 1  Treadmill:  1  Indication for ETT: known ASHD  Contraindication to ETT: No   Stress Modality: exercise - treadmill  Cardiac Imaging Performed: non   Protocol: standard Bruce - maximal  Max BP:  220/82  Max MPHR (bpm):  160 85% MPR (bpm):  136  MPHR obtained (bpm):  157 % MPHR obtained:  95%  Reached 85% MPHR (min:sec):  4:36 Total Exercise Time (min-sec):  6:30  Workload in METS:  7.7 Borg Scale: 19  Reason ETT Terminated:  patient's desire to stop    ST Segment Analysis At Rest: normal ST segments - no evidence of significant ST depression With Exercise: non-specific ST changes  Other Information Arrhythmia:  Yes - occasional PVCs at peak of recovery Angina during ETT:  absent (0) Quality of ETT:  diagnostic  ETT Interpretation:  borderline (indeterminate) with non-specific ST changes  Comments: Patient presents today for routine GXT. Has known CAD with prior CABG. He has returned to smoking. No cardiac symptoms reported.  Today the patient exercised on the standard Bruce protocol for a total of 6:30 minutes.  Reduced exercise tolerance.  Mildly hypertensive blood pressure response.  Clinically negative for chest pain. Test was stopped due to fatigue/achievement of target Hr.  EKG with positive ST depression inferolaterally. No significant arrhythmia noted but had occasional PVCs at peak of exercise.    Recommendations: Tracings reviewed with Dr. Radford Pax - the study is felt to be abnormal - patient is totally asymptomatic.  Will arrange for stress Myoview  Smoking cessation  CV risk factor modification  Further disposition to follow.  Patient is agreeable to this plan and will call if any problems develop in the interim.   Burtis Junes, RN, [place] [address] [facility] [place], [place]  [id number] [phone]

## 2014-10-11 ENCOUNTER — Encounter: Payer: Self-pay | Admitting: Cardiovascular Disease

## 2014-10-11 ENCOUNTER — Telehealth: Payer: Self-pay | Admitting: Cardiovascular Disease

## 2014-10-11 NOTE — Telephone Encounter (Signed)
OLD  MESSAGE   HAD  BEEN ADDRESSED./CY

## 2014-10-11 NOTE — Telephone Encounter (Signed)
New message ° ° ° ° °Returning Christine's call °

## 2014-10-23 ENCOUNTER — Ambulatory Visit (HOSPITAL_COMMUNITY): Payer: BC Managed Care – PPO | Attending: Cardiology | Admitting: Radiology

## 2014-10-23 DIAGNOSIS — R002 Palpitations: Secondary | ICD-10-CM | POA: Insufficient documentation

## 2014-10-23 DIAGNOSIS — I25810 Atherosclerosis of coronary artery bypass graft(s) without angina pectoris: Secondary | ICD-10-CM

## 2014-10-23 DIAGNOSIS — R0609 Other forms of dyspnea: Secondary | ICD-10-CM | POA: Diagnosis present

## 2014-10-23 DIAGNOSIS — I10 Essential (primary) hypertension: Secondary | ICD-10-CM | POA: Diagnosis not present

## 2014-10-23 DIAGNOSIS — E785 Hyperlipidemia, unspecified: Secondary | ICD-10-CM | POA: Insufficient documentation

## 2014-10-23 DIAGNOSIS — Z720 Tobacco use: Secondary | ICD-10-CM | POA: Insufficient documentation

## 2014-10-23 DIAGNOSIS — R0600 Dyspnea, unspecified: Secondary | ICD-10-CM

## 2014-10-23 DIAGNOSIS — R9430 Abnormal result of cardiovascular function study, unspecified: Secondary | ICD-10-CM

## 2014-10-23 MED ORDER — TECHNETIUM TC 99M SESTAMIBI GENERIC - CARDIOLITE
30.0000 | Freq: Once | INTRAVENOUS | Status: AC | PRN
  Administered 2014-10-23: 30 via INTRAVENOUS

## 2014-10-23 MED ORDER — TECHNETIUM TC 99M SESTAMIBI GENERIC - CARDIOLITE
10.0000 | Freq: Once | INTRAVENOUS | Status: AC | PRN
  Administered 2014-10-23: 10 via INTRAVENOUS

## 2014-10-23 NOTE — Progress Notes (Signed)
[facility] [address] [place], [place] [address] [phone]    Cardiology Nuclear Med Study  [name] is a 58 y.o. male     MRN : [id number]     DOB: July 07, 1956  Procedure Date: 10/23/2014  Nuclear Med Background Indication for Stress Test:  Evaluation for Ischemia, Graft Patency, and Abnormal GXT 10/06/14: Nonspecific ST changes with exercise History:  CABG, PAF, and 08-13-2010 Myocardial Perfusion Imaging-Normal, EF=56% Cardiac Risk Factors: Hypertension, Hyperlipidemia, and Smoker   Symptoms:  DOE and Palpitations   Nuclear Pre-Procedure Caffeine/Decaff Intake:  None NPO After: 7:00pm   Lungs:  clear O2 Sat: 95%% on room air. IV 0.9% NS with Angio Cath:  22g  IV Site: R Hand  IV Started by:  Matilde Haymaker, RN  Chest Size (in):  44 Cup Size: n/a  Height: 5\' 10"  (1.778 m)  Weight:  221 lb (100.245 kg)  BMI:  Body mass index is 31.71 kg/(m^2). Tech Comments:  Patient took Losartan this am. Irven Baltimore, RN.    Nuclear Med Study 1 or 2 day study: 1 day  Stress Test Type:  Stress  Reading MD: n/a  Order Authorizing Provider:  Verlin Dike  Resting Radionuclide: Technetium 19m Sestamibi  Resting Radionuclide Dose: 11.0 mCi   Stress Radionuclide:  Technetium 80m Sestamibi  Stress Radionuclide Dose: 33.0 mCi           Stress Protocol Rest HR: 54 Stress HR: 148  Rest BP: 141/82 Stress BP: 225/74  Exercise Time (min): 6:45 METS: 8.1   Predicted Max HR: 160 bpm % Max HR: 92.5 bpm Rate Pressure Product: 33300   Dose of Adenosine (mg):  n/a Dose of Lexiscan: n/a mg  Dose of Atropine (mg): n/a Dose of Dobutamine: n/a mcg/kg/min (at max HR)  Stress Test Technologist: Irven Baltimore, RN  Nuclear Technologist:  Earl Many, CNMT     Rest Procedure:  Myocardial perfusion imaging was performed at rest 45 minutes following the intravenous administration of Technetium 46m Sestamibi. Rest ECG: NSR - Normal EKG  Stress  Procedure:  The patient exercised on the treadmill utilizing the Bruce Protocol for 6:45 minutes, RPE=15. The patient stopped due to DOE and denied any chest pain. There was a hypertensive response to exercise. Technetium 66m Sestamibi was injected at peak exercise and myocardial perfusion imaging was performed after a brief delay. Stress ECG: No significant change from baseline ECG  QPS Raw Data Images:  Normal; no motion artifact; normal heart/lung ratio. Stress Images:  There is a small area of mild attenuation of the inferoapical LV and inferior wall.   Rest Images:  There is a small area of mild attenuation of the inferoapical LV  Subtraction (SDS):  There is a small area of mild isehcmia in the inferoapical segments.  Transient Ischemic Dilatation (Normal <1.22):  0.89 Lung/Heart Ratio (Normal <0.45):  0.35  Quantitative Gated Spect Images QGS EDV:  125 ml QGS ESV:  52 ml  Impression Exercise Capacity:  Fair exercise capacity. BP Response:  Normal blood pressure response. Clinical Symptoms:  No significant symptoms noted. ECG Impression:  No significant ST segment change suggestive of ischemia. Comparison with Prior Nuclear Study: No significant change from previous study 08/13/10.    Overall Impression:  Low risk stress nuclear study .  There is a small , mild reversible defect in the infero-apical segments. .  LV Ejection Fraction: 59%.  LV Wall Motion:  NL LV Function; NL Wall Motion.  Thayer Headings, Brooke Bonito., MD, [facility] 10/23/2014, 5:16 PM [address],  [place] Pager [phone]

## 2014-10-25 ENCOUNTER — Telehealth: Payer: Self-pay | Admitting: *Deleted

## 2014-10-25 NOTE — Telephone Encounter (Signed)
Germaine Pomfret A >','<< Less Detail',event)" href="javascript:;">More Detail >>   Josue Hector, MD   Sent: Tue October 24, 2014 5:15 PM    To: Richmond Campbell, LPN        Message     Asymptomatic post bypass low risk study Quit smoking continue medical RX       Diagnoses     Coronary artery disease involving coronary bypass graft of native heart without angina pectoris     ICD-9-CM: 414.05 ICD-10-CM: I25.810       Reason for Visit     Reason for Visit History       Current Vitals  Most recent update: 10/23/2014 10:25 AM by Odis Hollingshead, RN    BP Pulse Ht Wt BMI SpO2    141/82 mmHg 54 5\' 10"  (1.778 m) 221 lb (100.245 kg) 31.71 kg/m2 95%    Vitals History       Progress Notes      Thayer Headings, MD at 10/23/2014 8:10 AM     Status: Signed       Expand All Collapse All    [facility] [address] [place], [place] [address] [phone]    Cardiology Nuclear Med Study  AFFAN CALLOW is a 58 y.o. male MRN : [id number] DOB: May 23, 1956  Procedure Date: 10/23/2014  Nuclear Med Background Indication for Stress Test: Evaluation for Ischemia, Graft Patency, and Abnormal GXT 10/06/14: Nonspecific ST changes with exercise History: CABG, PAF, and 08-13-2010 Myocardial Perfusion Imaging-Normal, EF=56% Cardiac Risk Factors: Hypertension, Hyperlipidemia, and Smoker  Symptoms: DOE and Palpitations   Nuclear Pre-Procedure Caffeine/Decaff Intake: None NPO After: 7:00pm   Lungs: clear O2 Sat: 95%% on room air. IV 0.9% NS with Angio Cath: 22g  IV Site: R Hand  IV Started by: Matilde Haymaker, RN  Chest Size (in): 44 Cup Size: n/a  Height: 5\' 10"  (1.778 m)  Weight: 221 lb (100.245 kg)  BMI: Body mass index is 31.71 kg/(m^2). Tech Comments: Patient took Losartan this am. Irven Baltimore, RN.    Nuclear Med Study 1 or 2 day study: 1 day  Stress Test Type:  Stress  Reading MD: n/a  Order Authorizing Provider: Verlin Dike  Resting Radionuclide: Technetium 90m Sestamibi  Resting Radionuclide Dose: 11.0 mCi   Stress Radionuclide: Technetium 8m Sestamibi  Stress Radionuclide Dose: 33.0 mCi      Stress Protocol Rest HR: 54 Stress HR: 148  Rest BP: 141/82 Stress BP: 225/74  Exercise Time (min): 6:45 METS: 8.1   Predicted Max HR: 160 bpm % Max HR: 92.5 bpm Rate Pressure Product: 33300   Dose of Adenosine (mg): n/a Dose of Lexiscan: n/a mg  Dose of Atropine (mg): n/a Dose of Dobutamine: n/a mcg/kg/min (at max HR)  Stress Test Technologist: Irven Baltimore, RN  Nuclear Technologist: Earl Many, CNMT     Rest Procedure: Myocardial perfusion imaging was performed at rest 45 minutes following the intravenous administration of Technetium 43m Sestamibi. Rest ECG: NSR - Normal EKG  Stress Procedure: The patient exercised on the treadmill utilizing the Bruce Protocol for 6:45 minutes, RPE=15. The patient stopped due to DOE and denied any chest pain. There was a hypertensive response to exercise. Technetium 69m Sestamibi was injected at peak exercise and myocardial perfusion imaging was performed after a brief delay. Stress ECG: No significant change from baseline ECG  QPS Raw Data Images: Normal; no motion artifact; normal heart/lung ratio. Stress Images: There is a small  area of mild attenuation of the inferoapical LV and inferior wall.  Rest Images: There is a small area of mild attenuation of the inferoapical LV  Subtraction (SDS): There is a small area of mild isehcmia in the inferoapical segments.  Transient Ischemic Dilatation (Normal <1.22): 0.89 Lung/Heart Ratio (Normal <0.45): 0.35  Quantitative Gated Spect Images QGS EDV: 125 ml QGS ESV: 52 ml  Impression Exercise Capacity: Fair exercise capacity. BP Response: Normal blood pressure response. Clinical Symptoms: No significant  symptoms noted. ECG Impression: No significant ST segment change suggestive of ischemia. Comparison with Prior Nuclear Study: No significant change from previous study 08/13/10.   Overall Impression: Low risk stress nuclear study . There is a small , mild reversible defect in the infero-apical segments. .  LV Ejection Fraction: 59%. LV Wall Motion: NL LV Function; NL Wall Motion.   Thayer Headings, Brooke Bonito., MD, [facility] 10/23/2014, 5:16 PM [address] Office [phone] Pager [phone]                   Revision History       Date/Time User Action    > 10/23/2014 5:16 PM Thayer Headings, MD Sign     10/23/2014 2:02 PM Gardiner Coins Sign at close encounter     10/23/2014 12:33 PM Odis Hollingshead, RN Sign at close encounter     10/23/2014 8:19 AM Ranae Palms Hasspacher, RN Sign at close encounter                 Not recorded       Ordered Facility-Administered Medications       Dose Freq Start End    technetium sestamibi generic (CARDIOLITE) injection 10 milli Curie 10 milli Curie Once [facility] 10/23/2014 10/23/2014    Route: Intravenous    technetium sestamibi generic (CARDIOLITE) injection 30 milli Curie 30 milli Curie Once [facility] 10/23/2014 10/23/2014    Route: Intravenous        Administrations This Visit      technetium sestamibi generic (CARDIOLITE) injection [place]     Administered Action Dose Route Site Administered By Ordering Provider      10/23/2014 08:44 Contrast Given 10 milli Curie Intravenous Right Arm Elzbieta Hornowski Kubak Thayer Headings, MD      Patient Supplied?: No             technetium sestamibi generic (CARDIOLITE) injection [place]     Administered Action Dose Route Site Administered By Ordering Provider      10/23/2014 09:48 Contrast Given 30 milli Curie Intravenous Right Arm [place] Hornowski Kubak Thayer Headings, MD       Patient Supplied?: No                     Referring Provider     Sueanne Margarita, MD            Administrations This Visit      technetium sestamibi generic (CARDIOLITE) injection [place]     Administered Action Dose Route Site Administered By Ordering Provider      10/23/2014 08:44 Contrast Given 10 milli Curie Intravenous Right Arm [place] Hornowski Kubak Thayer Headings, MD      Patient Supplied?: No             technetium sestamibi generic (CARDIOLITE) injection 30 milli Curie     Administered Action Dose Route Site Administered By [facility]  10/23/2014 09:48 Contrast Given 30 milli Curie Intravenous Right Arm Elzbieta Hornowski Kubak Thayer Headings, MD      Patient Supplied?: No                All Flowsheet Templates (all recorded)     Anthropometrics    Custom Formula Data    Ejection Fraction    Encounter Vitals    Nuclear Med Study        Referring Provider     Sueanne Margarita, MD       All Charges for This Encounter     Code Description Service Date Service Provider Modifiers Otho Darner    [id number] [place] TC99M SESTAMIBI CARDIOLITE 10/23/2014 Thayer Headings, MD  2    [id number] [place] STRESS 10/23/2014 Thayer Headings, MD  1    [id number] [facility] NM [place] W SPECT W WA 10/23/2014 Thayer Headings, MD  1        Routing History     There are no sent or routed communications associated with this encounter.       AVS Reports     No AVS Snapshots are available for this encounter.       Smoking Cessation Audit Trail       Ready to Quit Counseling Given User Date and Time    Clinical Support - 10/06/2014 No Not Answered Burtis Junes, NP 10/06/2014 4:08 PM    Hospital Encounter - 08/16/2012 Yes Not Answered Kreg Shropshire, RN 08/16/2012 2:17 PM    Office Visit - 06/24/2012 No Yes Deneise Lever, MD 07/01/2012 10:07  PM    Office Visit - 04/23/2012 No Yes Deneise Lever, MD 04/27/2012 8:37 PM        Diabetic Foot Exam    No data filed       Diabetic Foot Form - Detailed    No data filed       Diabetic Foot Exam - Simple    No data filed       Guarantor Account: Clarance, Bollard ([id number])     Relation to Patient: Account Type Service Area    Self Personal/Family [place] for This Account     Coverage ID Payor Plan Insurance ID    [id number] [place] [id number]        Guarantor Account: Koua, Deeg ([id number])     Relation to Patient: Account Type Service Area    Self Personal/Family [place]          Guarantor Account: Nat, Lowenthal ([id number])     Relation to Patient: Account Type Service Area    Self Personal/Family [facility]          Guarantor Account: Schools, [id number] [place] ([id number])     Relation to Patient: Account Type Service Area    Self Workers [place]

## 2014-10-25 NOTE — Telephone Encounter (Signed)
LMTCB ./CY 

## 2014-10-30 NOTE — Telephone Encounter (Signed)
Informed patient of stress test results.  He requested that I submit this information to Dr. Delman Cheadle, [facility] on [address], as they need this information to complete his work physical/authorization.  Routed note to Dr. Brigitte Pulse.

## 2014-10-30 NOTE — Telephone Encounter (Signed)
Follow up     Returning [name]'s call from last week

## 2014-11-03 NOTE — Telephone Encounter (Signed)
Please pull [place] - [place] an [place] and needed stress test results (which are attached and normal) before a card can be issued - please attach the DOT form for me to addend as well as a new DOT card for completion and put in my box, thanks.

## 2015-07-27 ENCOUNTER — Other Ambulatory Visit: Payer: Self-pay | Admitting: Neurosurgery

## 2015-07-27 DIAGNOSIS — M5126 Other intervertebral disc displacement, lumbar region: Secondary | ICD-10-CM

## 2015-08-01 ENCOUNTER — Ambulatory Visit
Admission: RE | Admit: 2015-08-01 | Discharge: 2015-08-01 | Disposition: A | Discharge status: Home / Self Care | Payer: Worker's Compensation | Source: Ambulatory Visit | Attending: Neurosurgery | Admitting: Neurosurgery

## 2015-08-01 DIAGNOSIS — M5126 Other intervertebral disc displacement, lumbar region: Secondary | ICD-10-CM

## 2015-08-01 MED ORDER — IOHEXOL 180 MG/ML  SOLN
1.0000 mL | Freq: Once | INTRAMUSCULAR | Status: DC | PRN
  Administered 2015-08-01: 1 mL via EPIDURAL

## 2015-08-01 MED ORDER — METHYLPREDNISOLONE ACETATE 40 MG/ML INJ SUSP (RADIOLOG
120.0000 mg | Freq: Once | INTRAMUSCULAR | Status: AC
  Administered 2015-08-01: 120 mg via EPIDURAL

## 2015-08-01 NOTE — Discharge Instructions (Signed)

## 2015-08-22 ENCOUNTER — Other Ambulatory Visit: Payer: Self-pay | Admitting: *Deleted

## 2015-08-22 ENCOUNTER — Other Ambulatory Visit (INDEPENDENT_AMBULATORY_CARE_PROVIDER_SITE_OTHER): Payer: BC Managed Care – PPO | Admitting: *Deleted

## 2015-08-22 ENCOUNTER — Other Ambulatory Visit: Payer: Self-pay | Admitting: Neurosurgery

## 2015-08-22 ENCOUNTER — Telehealth: Payer: Self-pay | Admitting: *Deleted

## 2015-08-22 DIAGNOSIS — M5126 Other intervertebral disc displacement, lumbar region: Secondary | ICD-10-CM

## 2015-08-22 DIAGNOSIS — I10 Essential (primary) hypertension: Secondary | ICD-10-CM | POA: Diagnosis not present

## 2015-08-22 LAB — BASIC METABOLIC PANEL
BUN: 17 mg/dL (ref 7–25)
CALCIUM: 8.8 mg/dL (ref 8.6–10.3)
CHLORIDE: 106 mmol/L (ref 98–110)
CO2: 24 mmol/L (ref 20–31)
Creat: 1.12 mg/dL (ref 0.70–1.25)
GLUCOSE: 89 mg/dL (ref 65–99)
POTASSIUM: 4.4 mmol/L (ref 3.5–5.3)
SODIUM: 141 mmol/L (ref 135–146)

## 2015-08-22 MED ORDER — LOSARTAN POTASSIUM 50 MG PO TABS: ORAL_TABLET | ORAL | Status: DC

## 2015-08-22 NOTE — Telephone Encounter (Signed)
Walk in pt. Pt came in to make a yearly  appointment with Dr. Johnsie Cancel. Pt C/O to the front Conserv[employer], museum/gallery that he just saw the neurologist and the nurse there suggested that he needed to have his BP checked, because it was running high.  Pt's BP was checked 168/87, pt also C/O of a headache. Pt's last BMET was done in 2013 BUN 18 and Creatinine 0.96. Dr. Rayann Heman recommends for pt to have BMET today and to increased his Losartan 25 mg to 50 mg daily. An order for BMET. Losartan 50 mg once daily send to pt's pharmacy. Pt is aware of labs to be done today, to increased Losartan TO 50 MG DAILY, AND TO keep appointment with Dr. Johnsie Cancel on December 5 th 2016 at 8:30 AM.

## 2015-09-04 ENCOUNTER — Ambulatory Visit
Admission: RE | Admit: 2015-09-04 | Discharge: 2015-09-04 | Disposition: A | Discharge status: Home / Self Care | Payer: Worker's Compensation | Source: Ambulatory Visit | Attending: Neurosurgery | Admitting: Neurosurgery

## 2015-09-04 DIAGNOSIS — M5126 Other intervertebral disc displacement, lumbar region: Secondary | ICD-10-CM

## 2015-09-04 MED ORDER — IOHEXOL 180 MG/ML  SOLN
1.0000 mL | Freq: Once | INTRAMUSCULAR | Status: DC | PRN
  Administered 2015-09-04: 1 mL via EPIDURAL

## 2015-09-04 MED ORDER — METHYLPREDNISOLONE ACETATE 40 MG/ML INJ SUSP (RADIOLOG
120.0000 mg | Freq: Once | INTRAMUSCULAR | Status: AC
  Administered 2015-09-04: 120 mg via EPIDURAL

## 2015-09-27 NOTE — Progress Notes (Signed)
Patient ID: [name], male   DOB: 1955/12/23, 59 y.o.   MRN: [id number]   59 y.o.  seen post CABG 08/18/13  with Dr [name]  CORONARY ARTERY BYPASS GRAFTING x4  LIMA to LAD  SVG to Diagonal 1  SVG to Obtuse Marginal 1  SVG to Posterior Descending  D/C on amiodarone for brief PAF Maint NSR Healed well Has not smoked since D/C     Some numbness in hands ? 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 There is a small , mild reversible defect in the infero-apical segments. .  LV Ejection Fraction: 59%. LV Wall Motion: NL LV Function; NL Wall Motion.   Unfortunately started smoking again after quitting for a year   Cozaar increased 07/2015 as BP was running higher   Lots of pain from ruptured disc in back Seeing [name]   ROS: Denies fever, malais, weight loss, blurry vision, decreased visual acuity, cough, sputum, SOB, hemoptysis, pleuritic pain, palpitaitons, heartburn, abdominal pain, melena, lower extremity edema, claudication, or rash.  All other systems reviewed and negative  General: Affect appropriate Healthy:  appears stated age 59: normal Neck supple with no adenopathy JVP normal no bruits no thyromegaly Lungs clear with no wheezing and good diaphragmatic motion Heart:  S1/S2 no murmur, no rub, gallop or click PMI normal Abdomen: benighn, BS positve, no tenderness, no AAA no bruit.  No HSM or HJR Distal pulses intact with no bruits No edema Neuro non-focal Skin warm and dry No muscular weakness   Current Outpatient Prescriptions  Medication Sig Dispense Refill  . aspirin 325 MG tablet Take 325 mg by mouth daily.     [name] HYDROcodone-acetaminophen (NORCO/VICODIN) 5-325 MG tablet Take 1 tablet by mouth every 6 (six) hours as needed for moderate pain.    [name] losartan (COZAAR) 50 MG tablet  Take 50 mg by mouth daily.     No current facility-administered medications for this visit.    Allergies  Statins  Electrocardiogram:  9/14  SR rate 82 normal ECG 09/30/14  SR Rate 71 normal    10/01/15  SR rate 83  normal Q in lead 3   Assessment and Plan CAD/CABG:  Done 08/18/13  Low risk myovue 09/2014  Stable continue medical RX HTN:  Change to Hyzaar 100/25  F/u BMET 3 weeks after starting will take his current cozzaar 50 bid until done Back:  F/u [name] clear to have surgery if needed Norco for pain Smoking:  Mild expiratory wheezing on exam f/u CXR today    [employer]

## 2015-10-01 ENCOUNTER — Ambulatory Visit
Admission: RE | Admit: 2015-10-01 | Discharge: 2015-10-01 | Disposition: A | Discharge status: Home / Self Care | Payer: BC Managed Care – PPO | Source: Ambulatory Visit | Attending: Cardiovascular Disease | Admitting: Cardiovascular Disease

## 2015-10-01 ENCOUNTER — Encounter: Payer: Self-pay | Admitting: Cardiovascular Disease

## 2015-10-01 ENCOUNTER — Ambulatory Visit (INDEPENDENT_AMBULATORY_CARE_PROVIDER_SITE_OTHER): Payer: BC Managed Care – PPO | Admitting: Cardiovascular Disease

## 2015-10-01 ENCOUNTER — Other Ambulatory Visit: Payer: Self-pay | Admitting: Neurosurgery

## 2015-10-01 VITALS — BP 168/80 | HR 83 | Ht 69.0 in | Wt 217.0 lb

## 2015-10-01 DIAGNOSIS — F17200 Nicotine dependence, unspecified, uncomplicated: Secondary | ICD-10-CM

## 2015-10-01 DIAGNOSIS — R062 Wheezing: Secondary | ICD-10-CM

## 2015-10-01 DIAGNOSIS — M5126 Other intervertebral disc displacement, lumbar region: Secondary | ICD-10-CM

## 2015-10-01 DIAGNOSIS — I10 Essential (primary) hypertension: Secondary | ICD-10-CM

## 2015-10-01 DIAGNOSIS — Z720 Tobacco use: Secondary | ICD-10-CM | POA: Diagnosis not present

## 2015-10-01 DIAGNOSIS — Z0000 Encounter for general adult medical examination without abnormal findings: Secondary | ICD-10-CM | POA: Diagnosis not present

## 2015-10-01 MED ORDER — LOSARTAN POTASSIUM-HCTZ 100-25 MG PO TABS: 1.0000 | ORAL_TABLET | Freq: Every day | ORAL | Status: DC

## 2015-10-01 NOTE — Patient Instructions (Addendum)
Medication Instructions:  Your physician has recommended you make the following change in your medication:  1-START HYZAAR 100/25 mg by mouth daily 2-STOP Losartan   Labwork: Your physician recommends that you return for lab work in: 3 weeks for fasting lipid panel and BMET   Testing/Procedures: A chest x-ray takes a picture of the organs and structures inside the chest, including the heart, lungs, and blood vessels. This test can show several things, including, whether the heart is enlarges; whether fluid is building up in the lungs; and whether pacemaker / defibrillator leads are still in place.  Follow-Up: Your physician wants you to follow-up in: 3 months with Dr. Johnsie Cancel. You will receive a reminder letter in the mail two months in advance. If you don't receive a letter, please call our office to schedule the follow-up appointment.   If you need a refill on your cardiac medications before your next appointment, please call your pharmacy.

## 2015-10-08 ENCOUNTER — Ambulatory Visit
Admission: RE | Admit: 2015-10-08 | Discharge: 2015-10-08 | Disposition: A | Discharge status: Home / Self Care | Payer: Worker's Compensation | Source: Ambulatory Visit | Attending: Neurosurgery | Admitting: Neurosurgery

## 2015-10-08 DIAGNOSIS — M5126 Other intervertebral disc displacement, lumbar region: Secondary | ICD-10-CM

## 2015-10-08 MED ORDER — IOHEXOL 180 MG/ML  SOLN
15.0000 mL | Freq: Once | INTRAMUSCULAR | Status: AC | PRN
  Administered 2015-10-08: 15 mL via INTRATHECAL

## 2015-10-08 MED ORDER — DIAZEPAM 5 MG PO TABS
10.0000 mg | ORAL_TABLET | Freq: Once | ORAL | Status: AC
  Administered 2015-10-08: 10 mg via ORAL

## 2015-10-08 NOTE — Discharge Instructions (Signed)

## 2015-10-11 ENCOUNTER — Other Ambulatory Visit: Payer: BC Managed Care – PPO

## 2015-10-16 ENCOUNTER — Telehealth: Payer: Self-pay | Admitting: Cardiovascular Disease

## 2015-10-16 NOTE — Telephone Encounter (Signed)
New problem    Pt stated he was told by Theda Sers to call and speak with her once he starts a certain medication.

## 2015-10-16 NOTE — Telephone Encounter (Signed)
Scheduled patient's lab work for medication changes. Patient started Hyzaar 10/14/15. Lab scheduled for  11/06/15. Patient verbalized understanding.

## 2015-11-06 ENCOUNTER — Other Ambulatory Visit (INDEPENDENT_AMBULATORY_CARE_PROVIDER_SITE_OTHER): Payer: BC Managed Care – PPO | Admitting: *Deleted

## 2015-11-06 DIAGNOSIS — I10 Essential (primary) hypertension: Secondary | ICD-10-CM

## 2015-11-06 DIAGNOSIS — Z0000 Encounter for general adult medical examination without abnormal findings: Secondary | ICD-10-CM

## 2015-11-06 LAB — BASIC METABOLIC PANEL
BUN: 27 mg/dL — AB (ref 7–25)
CHLORIDE: 103 mmol/L (ref 98–110)
CO2: 20 mmol/L (ref 20–31)
Calcium: 9.4 mg/dL (ref 8.6–10.3)
Creat: 1.45 mg/dL — ABNORMAL HIGH (ref 0.70–1.25)
Glucose, Bld: 102 mg/dL — ABNORMAL HIGH (ref 65–99)
POTASSIUM: 4 mmol/L (ref 3.5–5.3)
Sodium: 137 mmol/L (ref 135–146)

## 2015-11-06 LAB — LIPID PANEL
Cholesterol: 188 mg/dL (ref 125–200)
HDL: 34 mg/dL — ABNORMAL LOW (ref >=40)
LDL Cholesterol: 114 mg/dL (ref <130)
Total CHOL/HDL Ratio: 5.5 Ratio — ABNORMAL HIGH (ref <=5.0)
Triglycerides: 201 mg/dL — ABNORMAL HIGH (ref <150)
VLDL: 40 mg/dL — AB (ref <30)

## 2015-11-06 NOTE — Addendum Note (Signed)
Addended by: Eulis Foster on: 11/06/2015 08:26 AM   Modules accepted: Orders

## 2015-11-07 ENCOUNTER — Telehealth: Payer: Self-pay

## 2015-11-07 DIAGNOSIS — I10 Essential (primary) hypertension: Secondary | ICD-10-CM

## 2015-11-07 MED ORDER — LOSARTAN POTASSIUM-HCTZ 100-12.5 MG PO TABS: 1.0000 | ORAL_TABLET | Freq: Every day | ORAL | Status: DC

## 2015-11-07 NOTE — Telephone Encounter (Signed)
Called patient about lab results. Per Dr. Johnsie Cancel, little dehydrated, cholesterol okay. Change diuretic to 12.5 mg. Patient verbalized understanding.

## 2015-11-21 ENCOUNTER — Telehealth: Payer: Self-pay | Admitting: Cardiovascular Disease

## 2015-11-21 NOTE — Telephone Encounter (Signed)
New message      Calling to check the status on a surgical clearance form that was faxed days ago.  Did you get it?

## 2015-11-21 NOTE — Telephone Encounter (Signed)
Received fax. Dr. Johnsie Cancel is not in the office today. Will send fax back when Dr. Johnsie Cancel has reviewed.

## 2015-11-23 NOTE — Telephone Encounter (Signed)
Medical records sent signed fax for surgical clearance on 11/22/2015. It is also scanned into the patient's chart under the media tab.

## 2015-11-27 ENCOUNTER — Other Ambulatory Visit: Payer: Self-pay | Admitting: Orthopedic Surgery

## 2015-11-30 NOTE — Pre-Procedure Instructions (Signed)
DALEY ALARIE  11/30/2015     Your procedure is scheduled on : Wednesday December 05, 2015 at 12:20 PM.  Report to [facility] Admitting at 9:20 AM.  Call this number if you have problems the morning of surgery: [phone]    Remember:  Do not eat food or drink liquids after midnight.  Take these medicines the morning of surgery with A SIP OF WATER : Omeprazole (Prilosec)   Stop taking any vitamins, herbal medications/supplements, NSAIDs, Ibuprofen, Advil, Motrin, Aleve, etc today   Do not wear jewelry.  Do not wear lotions, powders, or cologne.    Men may shave face and neck.  Do not bring valuables to the hospital.  [facility] is not responsible for any belongings or valuables.  Contacts, dentures or bridgework may not be worn into surgery.  Leave your suitcase in the car.  After surgery it may be brought to your room.  For patients admitted to the hospital, discharge time will be determined by your treatment team.  Patients discharged the day of surgery will not be allowed to drive home.   Name and phone number of your driver:    Special instructions:  Shower using CHG soap the night before and the morning of your surgery  Please read over the following fact sheets that you were given. Pain Booklet, Coughing and Deep Breathing, MRSA Information and Surgical Site Infection Prevention

## 2015-12-03 ENCOUNTER — Encounter (HOSPITAL_COMMUNITY)
Admission: RE | Admit: 2015-12-03 | Discharge: 2015-12-03 | Disposition: A | Discharge status: Home / Self Care | Payer: Worker's Compensation | Source: Ambulatory Visit | Attending: Orthopedic Surgery | Admitting: Orthopedic Surgery

## 2015-12-03 ENCOUNTER — Encounter (HOSPITAL_COMMUNITY): Payer: Self-pay

## 2015-12-03 DIAGNOSIS — K219 Gastro-esophageal reflux disease without esophagitis: Secondary | ICD-10-CM | POA: Diagnosis not present

## 2015-12-03 DIAGNOSIS — Z01812 Encounter for preprocedural laboratory examination: Secondary | ICD-10-CM | POA: Diagnosis not present

## 2015-12-03 DIAGNOSIS — Z951 Presence of aortocoronary bypass graft: Secondary | ICD-10-CM | POA: Insufficient documentation

## 2015-12-03 DIAGNOSIS — Z955 Presence of coronary angioplasty implant and graft: Secondary | ICD-10-CM | POA: Diagnosis not present

## 2015-12-03 DIAGNOSIS — M4800 Spinal stenosis, site unspecified: Secondary | ICD-10-CM | POA: Diagnosis not present

## 2015-12-03 DIAGNOSIS — Z01818 Encounter for other preprocedural examination: Secondary | ICD-10-CM | POA: Insufficient documentation

## 2015-12-03 DIAGNOSIS — I2510 Atherosclerotic heart disease of native coronary artery without angina pectoris: Secondary | ICD-10-CM | POA: Insufficient documentation

## 2015-12-03 DIAGNOSIS — Z7982 Long term (current) use of aspirin: Secondary | ICD-10-CM | POA: Insufficient documentation

## 2015-12-03 DIAGNOSIS — Z87891 Personal history of nicotine dependence: Secondary | ICD-10-CM | POA: Diagnosis not present

## 2015-12-03 DIAGNOSIS — I10 Essential (primary) hypertension: Secondary | ICD-10-CM | POA: Diagnosis not present

## 2015-12-03 DIAGNOSIS — Z79899 Other long term (current) drug therapy: Secondary | ICD-10-CM | POA: Insufficient documentation

## 2015-12-03 DIAGNOSIS — F17200 Nicotine dependence, unspecified, uncomplicated: Secondary | ICD-10-CM | POA: Diagnosis not present

## 2015-12-03 DIAGNOSIS — E785 Hyperlipidemia, unspecified: Secondary | ICD-10-CM | POA: Insufficient documentation

## 2015-12-03 DIAGNOSIS — G4733 Obstructive sleep apnea (adult) (pediatric): Secondary | ICD-10-CM | POA: Diagnosis not present

## 2015-12-03 LAB — CBC WITH DIFFERENTIAL/PLATELET
Basophils Absolute: 0.1 10*3/uL (ref 0.0–0.1)
Basophils Relative: 1 %
Eosinophils Absolute: 0.7 10*3/uL (ref 0.0–0.7)
Eosinophils Relative: 8 %
HEMATOCRIT: 41.7 % (ref 39.0–52.0)
HEMOGLOBIN: 14.5 g/dL (ref 13.0–17.0)
LYMPHS ABS: 1.8 10*3/uL (ref 0.7–4.0)
LYMPHS PCT: 23 %
MCH: 31.8 pg (ref 26.0–34.0)
MCHC: 34.8 g/dL (ref 30.0–36.0)
MCV: 91.4 fL (ref 78.0–100.0)
Monocytes Absolute: 0.5 10*3/uL (ref 0.1–1.0)
Monocytes Relative: 7 %
NEUTROS ABS: 4.9 10*3/uL (ref 1.7–7.7)
NEUTROS PCT: 61 %
Platelets: 181 10*3/uL (ref 150–400)
RBC: 4.56 MIL/uL (ref 4.22–5.81)
RDW: 12.1 % (ref 11.5–15.5)
WBC: 8 10*3/uL (ref 4.0–10.5)

## 2015-12-03 LAB — SURGICAL PCR SCREEN
MRSA, PCR: NEGATIVE
STAPHYLOCOCCUS AUREUS: NEGATIVE

## 2015-12-03 LAB — URINALYSIS, ROUTINE W REFLEX MICROSCOPIC
Bilirubin Urine: NEGATIVE
GLUCOSE, UA: NEGATIVE mg/dL
Hgb urine dipstick: NEGATIVE
KETONES UR: NEGATIVE mg/dL
LEUKOCYTES UA: NEGATIVE
NITRITE: NEGATIVE
PH: 5.5 (ref 5.0–8.0)
PROTEIN: NEGATIVE mg/dL
Specific Gravity, Urine: 1.011 (ref 1.005–1.030)

## 2015-12-03 LAB — COMPREHENSIVE METABOLIC PANEL
ALK PHOS: 43 U/L (ref 38–126)
ALT: 24 U/L (ref 17–63)
AST: 27 U/L (ref 15–41)
Albumin: 4.1 g/dL (ref 3.5–5.0)
Anion gap: 13 (ref 5–15)
BUN: 18 mg/dL (ref 6–20)
CO2: 22 mmol/L (ref 22–32)
CREATININE: 1.58 mg/dL — AB (ref 0.61–1.24)
Calcium: 9.5 mg/dL (ref 8.9–10.3)
Chloride: 103 mmol/L (ref 101–111)
GFR, EST AFRICAN AMERICAN: 53 mL/min — AB (ref >60)
GFR, EST NON AFRICAN AMERICAN: 46 mL/min — AB (ref >60)
Glucose, Bld: 129 mg/dL — ABNORMAL HIGH (ref 65–99)
Potassium: 3.7 mmol/L (ref 3.5–5.1)
Sodium: 138 mmol/L (ref 135–145)
Total Bilirubin: 0.9 mg/dL (ref 0.3–1.2)
Total Protein: 6.8 g/dL (ref 6.5–8.1)

## 2015-12-03 LAB — PROTIME-INR
INR: 1.09 (ref 0.00–1.49)
PROTHROMBIN TIME: 14.3 s (ref 11.6–15.2)

## 2015-12-03 LAB — APTT: aPTT: 31 seconds (ref 24–37)

## 2015-12-03 NOTE — Progress Notes (Signed)
CARLA CALLED FROM DR. Laurena Bering OFFICE AND STATED SHE WOULD CALL PATIENT.  CARLA STATED PATIENT WAS INSTRUCTED TO STOP ASPIRIN BUT STATED DR. Lynann Bologna WILL PROCEED W/ SURGERY.  CALLED PATIENT TO BE SURE HE GOT THE MESSAGE TO STOP ASPIRIN.

## 2015-12-04 MED ORDER — CEFAZOLIN SODIUM-DEXTROSE 2-3 GM-% IV SOLR
2.0000 g | INTRAVENOUS | Status: AC
  Administered 2015-12-05: 2 g via INTRAVENOUS
  Filled 2015-12-04: qty 50

## 2015-12-04 MED ORDER — POVIDONE-IODINE 7.5 % EX SOLN
Freq: Once | CUTANEOUS | Status: DC
  Filled 2015-12-04: qty 118

## 2015-12-04 NOTE — Progress Notes (Signed)
Anesthesia Chart Review:  Pt is a 60 year old male scheduled for L2-3, L3-4 decompression on 12/05/2015 with Dr. Lynann Bologna.   Cardiologist is Dr. Jenkins Rouge, who cleared pt for surgery (in media tab).   PMH includes:  CAD (a. s/p stent to RCA 2005; b. S/p DES to [facility] 12/07; s/p CABG 08/18/12: LIMA to LAD, SVG to diagonal 1, SVG to OM1, SVG to PDA), HTN, hyperlipidemia, OSA (on CPAP), GERD. Current smoker. BMI 32.   Medications: ASA, losartan-hctz, prilosec.   Preoperative labs reviewed.    Chest x-ray 10/01/15 reviewed. No edema or consolidation  EKG 10/01/15: Sinus rhythm per Dr. Kyla Balzarine interpretation  Nuclear stress test 10/23/14: Low risk stress nuclear study . There is a small , mild reversible defect in the infero-apical segments. LV Ejection Fraction: 59%. LV Wall Motion: NL LV Function; NL Wall Motion.  If no changes, I anticipate pt can proceed with surgery as scheduled.   Willeen Cass, FNP-BC [facility]Anesthesiology Phone: [phone] 12/04/2015 1:34 PM

## 2015-12-04 NOTE — H&P (Signed)
PREOPERATIVE H&P  Chief Complaint: Bilateral leg pain  HPI: [name] is a 60 y.o. male who presents with ongoing pain in the bilateral legs  MRI reveals spinal stenosis L2-L4  Patient has failed multiple forms of conservative care and continues to have pain (see office notes for additional details regarding the patient's full course of treatment)  Past Medical History  Diagnosis Date  . HYPERTRIGLYCERIDEMIA   . HYPERTENSION   . HYPERLIPIDEMIA   . HEMORRHOIDS   . CORONARY ARTERY DISEASE     a. s/p stent to RCA 2005;  b. Last LHC 12/07: Mid LAD 40%, proximal circumflex 20%, mid circumflex 95%, mid RCA stent okay, normal LV function. PCI: Taxus DES to the [facility];  c.  Myoview 10/11: EF 56%, no ischemia or scar    . OBESITY   . GERD   . DYSPHAGIA UNSPECIFIED     intermittent  . DIVERTICULOSIS, COLON   . COLONIC POLYPS, HX OF   . CHEST PAIN   . SLEEP APNEA     on CPAP   Past Surgical History  Procedure Laterality Date  . Cardiac catheterization    . Right long finger extensor tendon repair    . Achilles tendon repair    . Back surgery      lower back  . Coronary artery bypass graft  08/18/2012    Procedure: CORONARY ARTERY BYPASS GRAFTING (CABG);  Surgeon: Gaye Pollack, MD;  Location: [facility];  Service: Open Heart Surgery;  Laterality: N/A;  CABG x four;  using left internal mammary artery and right leg greater saphenous vein harvested endoscopically  . Knee arthroscopy      RIGHT   Social History   Social History  . Marital Status: Married    Spouse Name: N/A  . Number of Children: N/A  . Years of Education: N/A   Social History Main Topics  . Smoking status: Current Every Day Smoker -- 1.50 packs/day for 43 years    Types: Cigarettes    Last Attempt to Quit: 08/18/2012  . Smokeless tobacco: Not on file     Comment: Patient attempting to quit pt down to 1 ppd  . Alcohol Use: Yes     Comment: 1-2 drink occasionally  . Drug Use: No  . Sexual Activity:  Not on file   Other Topics Concern  . Not on file   Social History Narrative   Family History  Problem Relation Age of Onset  . Cancer Mother   . Heart disease Brother    Allergies  Allergen Reactions  . Statins Other (See Comments)    Bone and muscle pain  . Hydrocodone Nausea And Vomiting   Prior to Admission medications   Medication Sig Start Date End Date Taking? Authorizing Provider  aspirin 325 MG tablet Take 325 mg by mouth daily.    Yes Historical Provider, MD  losartan-hydrochlorothiazide (HYZAAR) 100-12.5 MG tablet Take 1 tablet by mouth daily. 11/07/15  Yes Josue Hector, MD  omeprazole (PRILOSEC) 20 MG capsule Take 20 mg by mouth daily.   Yes Historical Provider, MD     All other systems have been reviewed and were otherwise negative with the exception of those mentioned in the HPI and as above.  Physical Exam: There were no vitals filed for this visit.  General: Alert, no acute distress Cardiovascular: No pedal edema Respiratory: No cyanosis, no use of accessory musculature Skin: No lesions in the area of chief complaint Neurologic:  Sensation intact distally Psychiatric: Patient is competent for consent with normal mood and affect Lymphatic: No axillary or cervical lymphadenopathy  MUSCULOSKELETAL: NVI bilateral lower extremities  Assessment/Plan: Spinal stenosis Plan for Procedure(s): LUMBAR LAMINECTOMY/DECOMPRESSION (L2-L4)   Sinclair Ship, MD 12/04/2015 10:06 AM

## 2015-12-05 ENCOUNTER — Observation Stay (HOSPITAL_COMMUNITY)
Admission: RE | Admit: 2015-12-05 | Discharge: 2015-12-06 | Disposition: A | Discharge status: Home / Self Care | Payer: Worker's Compensation | Source: Ambulatory Visit | Attending: Orthopedic Surgery | Admitting: Orthopedic Surgery

## 2015-12-05 ENCOUNTER — Ambulatory Visit (HOSPITAL_COMMUNITY): Payer: Worker's Compensation

## 2015-12-05 ENCOUNTER — Ambulatory Visit (HOSPITAL_COMMUNITY): Payer: Worker's Compensation | Admitting: Anesthesiology

## 2015-12-05 ENCOUNTER — Ambulatory Visit (HOSPITAL_COMMUNITY): Payer: Worker's Compensation | Admitting: Emergency Medicine

## 2015-12-05 ENCOUNTER — Encounter (HOSPITAL_COMMUNITY): Payer: Self-pay | Admitting: Anesthesiology

## 2015-12-05 ENCOUNTER — Encounter (HOSPITAL_COMMUNITY)
Admission: RE | Disposition: A | Discharge status: Home / Self Care | Payer: Self-pay | Source: Ambulatory Visit | Attending: Orthopedic Surgery

## 2015-12-05 DIAGNOSIS — E781 Pure hyperglyceridemia: Secondary | ICD-10-CM | POA: Diagnosis not present

## 2015-12-05 DIAGNOSIS — E785 Hyperlipidemia, unspecified: Secondary | ICD-10-CM | POA: Diagnosis not present

## 2015-12-05 DIAGNOSIS — K219 Gastro-esophageal reflux disease without esophagitis: Secondary | ICD-10-CM | POA: Diagnosis not present

## 2015-12-05 DIAGNOSIS — Z7982 Long term (current) use of aspirin: Secondary | ICD-10-CM | POA: Insufficient documentation

## 2015-12-05 DIAGNOSIS — Z6831 Body mass index (BMI) 31.0-31.9, adult: Secondary | ICD-10-CM | POA: Diagnosis not present

## 2015-12-05 DIAGNOSIS — Z86010 Personal history of colonic polyps: Secondary | ICD-10-CM | POA: Insufficient documentation

## 2015-12-05 DIAGNOSIS — I2510 Atherosclerotic heart disease of native coronary artery without angina pectoris: Secondary | ICD-10-CM | POA: Diagnosis not present

## 2015-12-05 DIAGNOSIS — Z8719 Personal history of other diseases of the digestive system: Secondary | ICD-10-CM | POA: Diagnosis not present

## 2015-12-05 DIAGNOSIS — Z885 Allergy status to narcotic agent status: Secondary | ICD-10-CM | POA: Insufficient documentation

## 2015-12-05 DIAGNOSIS — M4806 Spinal stenosis, lumbar region: Secondary | ICD-10-CM | POA: Diagnosis present

## 2015-12-05 DIAGNOSIS — Z955 Presence of coronary angioplasty implant and graft: Secondary | ICD-10-CM | POA: Insufficient documentation

## 2015-12-05 DIAGNOSIS — F17210 Nicotine dependence, cigarettes, uncomplicated: Secondary | ICD-10-CM | POA: Diagnosis not present

## 2015-12-05 DIAGNOSIS — Z419 Encounter for procedure for purposes other than remedying health state, unspecified: Secondary | ICD-10-CM

## 2015-12-05 DIAGNOSIS — G9519 Other vascular myelopathies: Secondary | ICD-10-CM | POA: Diagnosis present

## 2015-12-05 DIAGNOSIS — Z888 Allergy status to other drugs, medicaments and biological substances status: Secondary | ICD-10-CM | POA: Insufficient documentation

## 2015-12-05 DIAGNOSIS — M48062 Spinal stenosis, lumbar region with neurogenic claudication: Secondary | ICD-10-CM | POA: Diagnosis present

## 2015-12-05 DIAGNOSIS — G4730 Sleep apnea, unspecified: Secondary | ICD-10-CM | POA: Diagnosis not present

## 2015-12-05 DIAGNOSIS — I10 Essential (primary) hypertension: Secondary | ICD-10-CM | POA: Diagnosis not present

## 2015-12-05 HISTORY — PX: LUMBAR LAMINECTOMY/DECOMPRESSION MICRODISCECTOMY: SHX5026

## 2015-12-05 SURGERY — LUMBAR LAMINECTOMY/DECOMPRESSION MICRODISCECTOMY
Anesthesia: General

## 2015-12-05 MED ORDER — LOSARTAN POTASSIUM 50 MG PO TABS
100.0000 mg | ORAL_TABLET | Freq: Every day | ORAL | Status: DC
  Administered 2015-12-06: 100 mg via ORAL
  Filled 2015-12-05: qty 2

## 2015-12-05 MED ORDER — DEXAMETHASONE SODIUM PHOSPHATE 4 MG/ML IJ SOLN
INTRAMUSCULAR | Status: AC
  Filled 2015-12-05: qty 1

## 2015-12-05 MED ORDER — PROMETHAZINE HCL 25 MG/ML IJ SOLN: 6.2500 mg | INTRAMUSCULAR | Status: DC | PRN

## 2015-12-05 MED ORDER — GLYCOPYRROLATE 0.2 MG/ML IJ SOLN
INTRAMUSCULAR | Status: AC
  Filled 2015-12-05: qty 1

## 2015-12-05 MED ORDER — LIDOCAINE HCL (CARDIAC) 20 MG/ML IV SOLN
INTRAVENOUS | Status: DC | PRN
  Administered 2015-12-05: 60 mg via INTRAVENOUS

## 2015-12-05 MED ORDER — SUGAMMADEX SODIUM 200 MG/2ML IV SOLN
INTRAVENOUS | Status: AC
  Filled 2015-12-05: qty 2

## 2015-12-05 MED ORDER — FENTANYL CITRATE (PF) 100 MCG/2ML IJ SOLN
25.0000 ug | INTRAMUSCULAR | Status: DC | PRN
  Administered 2015-12-05 (×2): 50 ug via INTRAVENOUS

## 2015-12-05 MED ORDER — PANTOPRAZOLE SODIUM 40 MG PO TBEC
40.0000 mg | DELAYED_RELEASE_TABLET | Freq: Every day | ORAL | Status: DC
  Administered 2015-12-06: 40 mg via ORAL
  Filled 2015-12-05: qty 1

## 2015-12-05 MED ORDER — LIDOCAINE HCL (CARDIAC) 20 MG/ML IV SOLN
INTRAVENOUS | Status: AC
  Filled 2015-12-05: qty 5

## 2015-12-05 MED ORDER — THROMBIN 20000 UNITS EX SOLR
CUTANEOUS | Status: AC
  Filled 2015-12-05: qty 20000

## 2015-12-05 MED ORDER — THROMBIN 20000 UNITS EX SOLR
CUTANEOUS | Status: DC | PRN
  Administered 2015-12-05: 20 mL via TOPICAL

## 2015-12-05 MED ORDER — SODIUM CHLORIDE 0.9% FLUSH: 3.0000 mL | INTRAVENOUS | Status: DC | PRN

## 2015-12-05 MED ORDER — HYDROCHLOROTHIAZIDE 12.5 MG PO CAPS
12.5000 mg | ORAL_CAPSULE | Freq: Every day | ORAL | Status: DC
  Administered 2015-12-06: 12.5 mg via ORAL
  Filled 2015-12-05: qty 1

## 2015-12-05 MED ORDER — SUCCINYLCHOLINE CHLORIDE 20 MG/ML IJ SOLN
INTRAMUSCULAR | Status: AC
  Filled 2015-12-05: qty 1

## 2015-12-05 MED ORDER — MINERAL OIL LIGHT 100 % EX OIL
TOPICAL_OIL | CUTANEOUS | Status: AC
  Filled 2015-12-05: qty 25

## 2015-12-05 MED ORDER — SODIUM CHLORIDE 0.9 % IJ SOLN
INTRAMUSCULAR | Status: AC
  Filled 2015-12-05: qty 10

## 2015-12-05 MED ORDER — METHYLPREDNISOLONE ACETATE 40 MG/ML IJ SUSP
INTRAMUSCULAR | Status: AC
  Filled 2015-12-05: qty 1

## 2015-12-05 MED ORDER — METHYLPREDNISOLONE ACETATE 40 MG/ML IJ SUSP
INTRAMUSCULAR | Status: DC | PRN
  Administered 2015-12-05: 40 mg

## 2015-12-05 MED ORDER — SENNOSIDES-DOCUSATE SODIUM 8.6-50 MG PO TABS: 1.0000 | ORAL_TABLET | Freq: Every evening | ORAL | Status: DC | PRN

## 2015-12-05 MED ORDER — FENTANYL CITRATE (PF) 100 MCG/2ML IJ SOLN
INTRAMUSCULAR | Status: AC
  Filled 2015-12-05: qty 2

## 2015-12-05 MED ORDER — PHENYLEPHRINE HCL 10 MG/ML IJ SOLN
INTRAMUSCULAR | Status: DC | PRN
  Administered 2015-12-05: 80 ug via INTRAVENOUS
  Administered 2015-12-05: 40 ug via INTRAVENOUS
  Administered 2015-12-05 (×2): 80 ug via INTRAVENOUS
  Administered 2015-12-05 (×3): 40 ug via INTRAVENOUS
  Administered 2015-12-05: 80 ug via INTRAVENOUS

## 2015-12-05 MED ORDER — DOCUSATE SODIUM 100 MG PO CAPS
100.0000 mg | ORAL_CAPSULE | Freq: Two times a day (BID) | ORAL | Status: DC
  Administered 2015-12-05 – 2015-12-06 (×2): 100 mg via ORAL
  Filled 2015-12-05 (×2): qty 1

## 2015-12-05 MED ORDER — SODIUM CHLORIDE 0.9% FLUSH: 3.0000 mL | Freq: Two times a day (BID) | INTRAVENOUS | Status: DC

## 2015-12-05 MED ORDER — ONDANSETRON HCL 4 MG/2ML IJ SOLN
INTRAMUSCULAR | Status: DC | PRN
  Administered 2015-12-05: 4 mg via INTRAVENOUS

## 2015-12-05 MED ORDER — SUGAMMADEX SODIUM 200 MG/2ML IV SOLN
INTRAVENOUS | Status: DC | PRN
  Administered 2015-12-05: 195.2 mg via INTRAVENOUS

## 2015-12-05 MED ORDER — ROCURONIUM BROMIDE 100 MG/10ML IV SOLN
INTRAVENOUS | Status: DC | PRN
  Administered 2015-12-05: 50 mg via INTRAVENOUS

## 2015-12-05 MED ORDER — EPHEDRINE SULFATE 50 MG/ML IJ SOLN
INTRAMUSCULAR | Status: AC
  Filled 2015-12-05: qty 1

## 2015-12-05 MED ORDER — MORPHINE SULFATE (PF) 2 MG/ML IV SOLN
1.0000 mg | INTRAVENOUS | Status: DC | PRN
  Administered 2015-12-05: 4 mg via INTRAVENOUS
  Filled 2015-12-05: qty 2

## 2015-12-05 MED ORDER — ONDANSETRON HCL 4 MG/2ML IJ SOLN
INTRAMUSCULAR | Status: AC
  Filled 2015-12-05: qty 2

## 2015-12-05 MED ORDER — DEXAMETHASONE SODIUM PHOSPHATE 4 MG/ML IJ SOLN
INTRAMUSCULAR | Status: DC | PRN
  Administered 2015-12-05: 4 mg via INTRAVENOUS

## 2015-12-05 MED ORDER — LACTATED RINGERS IV SOLN
INTRAVENOUS | Status: DC
  Administered 2015-12-05: 14:00:00 via INTRAVENOUS
  Administered 2015-12-05: 50 mL/h via INTRAVENOUS
  Administered 2015-12-05: 13:00:00 via INTRAVENOUS

## 2015-12-05 MED ORDER — MIDAZOLAM HCL 2 MG/2ML IJ SOLN
INTRAMUSCULAR | Status: AC
  Filled 2015-12-05: qty 2

## 2015-12-05 MED ORDER — SODIUM CHLORIDE 0.9 % IV SOLN: 250.0000 mL | INTRAVENOUS | Status: DC

## 2015-12-05 MED ORDER — BUPIVACAINE-EPINEPHRINE (PF) 0.25% -1:200000 IJ SOLN
INTRAMUSCULAR | Status: AC
  Filled 2015-12-05: qty 30

## 2015-12-05 MED ORDER — ACETAMINOPHEN 650 MG RE SUPP: 650.0000 mg | RECTAL | Status: DC | PRN

## 2015-12-05 MED ORDER — MIDAZOLAM HCL 5 MG/5ML IJ SOLN
INTRAMUSCULAR | Status: DC | PRN
  Administered 2015-12-05: 2 mg via INTRAVENOUS

## 2015-12-05 MED ORDER — METHYLENE BLUE 0.5 % INJ SOLN
INTRAVENOUS | Status: DC | PRN
  Administered 2015-12-05: .2 mL via INTRADERMAL

## 2015-12-05 MED ORDER — FLEET ENEMA 7-19 GM/118ML RE ENEM: 1.0000 | ENEMA | Freq: Once | RECTAL | Status: DC | PRN

## 2015-12-05 MED ORDER — FENTANYL CITRATE (PF) 250 MCG/5ML IJ SOLN
INTRAMUSCULAR | Status: AC
  Filled 2015-12-05: qty 5

## 2015-12-05 MED ORDER — BUPIVACAINE-EPINEPHRINE 0.25% -1:200000 IJ SOLN
INTRAMUSCULAR | Status: DC | PRN
  Administered 2015-12-05: 15 mL

## 2015-12-05 MED ORDER — CEFAZOLIN SODIUM 1-5 GM-% IV SOLN
1.0000 g | Freq: Three times a day (TID) | INTRAVENOUS | Status: AC
  Administered 2015-12-05 – 2015-12-06 (×2): 1 g via INTRAVENOUS
  Filled 2015-12-05 (×2): qty 50

## 2015-12-05 MED ORDER — PROPOFOL 10 MG/ML IV BOLUS
INTRAVENOUS | Status: AC
  Filled 2015-12-05: qty 20

## 2015-12-05 MED ORDER — PHENOL 1.4 % MT LIQD
1.0000 | OROMUCOSAL | Status: DC | PRN
Start: 2015-12-05 — End: 2015-12-06

## 2015-12-05 MED ORDER — SODIUM CHLORIDE 0.9 % IV SOLN: INTRAVENOUS | Status: DC

## 2015-12-05 MED ORDER — LOSARTAN POTASSIUM-HCTZ 100-12.5 MG PO TABS: 1.0000 | ORAL_TABLET | Freq: Every day | ORAL | Status: DC

## 2015-12-05 MED ORDER — ONDANSETRON HCL 4 MG/2ML IJ SOLN
4.0000 mg | INTRAMUSCULAR | Status: DC | PRN
Start: 2015-12-05 — End: 2015-12-06

## 2015-12-05 MED ORDER — ZOLPIDEM TARTRATE 5 MG PO TABS
5.0000 mg | ORAL_TABLET | Freq: Every evening | ORAL | Status: DC | PRN
  Administered 2015-12-06: 5 mg via ORAL
  Filled 2015-12-05: qty 1

## 2015-12-05 MED ORDER — MENTHOL 3 MG MT LOZG
1.0000 | LOZENGE | OROMUCOSAL | Status: DC | PRN
Start: 2015-12-05 — End: 2015-12-06

## 2015-12-05 MED ORDER — OXYCODONE-ACETAMINOPHEN 5-325 MG PO TABS
1.0000 | ORAL_TABLET | ORAL | Status: DC | PRN
  Administered 2015-12-05 – 2015-12-06 (×4): 2 via ORAL
  Filled 2015-12-05 (×4): qty 2

## 2015-12-05 MED ORDER — ALUM & MAG HYDROXIDE-SIMETH 200-200-20 MG/5ML PO SUSP: 30.0000 mL | Freq: Four times a day (QID) | ORAL | Status: DC | PRN

## 2015-12-05 MED ORDER — BISACODYL 5 MG PO TBEC: 5.0000 mg | DELAYED_RELEASE_TABLET | Freq: Every day | ORAL | Status: DC | PRN

## 2015-12-05 MED ORDER — PROPOFOL 10 MG/ML IV BOLUS
INTRAVENOUS | Status: DC | PRN
  Administered 2015-12-05: 200 mg via INTRAVENOUS

## 2015-12-05 MED ORDER — PHENYLEPHRINE HCL 10 MG/ML IJ SOLN
10.0000 mg | INTRAVENOUS | Status: DC | PRN
  Administered 2015-12-05: 20 ug/min via INTRAVENOUS

## 2015-12-05 MED ORDER — FENTANYL CITRATE (PF) 100 MCG/2ML IJ SOLN
INTRAMUSCULAR | Status: DC | PRN
Start: 2015-12-05 — End: 2015-12-05
  Administered 2015-12-05: 50 ug via INTRAVENOUS
  Administered 2015-12-05: 100 ug via INTRAVENOUS
  Administered 2015-12-05 (×7): 50 ug via INTRAVENOUS

## 2015-12-05 MED ORDER — ROCURONIUM BROMIDE 50 MG/5ML IV SOLN
INTRAVENOUS | Status: AC
  Filled 2015-12-05: qty 1

## 2015-12-05 MED ORDER — DIAZEPAM 5 MG PO TABS
5.0000 mg | ORAL_TABLET | Freq: Four times a day (QID) | ORAL | Status: DC | PRN
  Administered 2015-12-05 – 2015-12-06 (×2): 5 mg via ORAL
  Filled 2015-12-05 (×2): qty 1

## 2015-12-05 MED ORDER — MINERAL OIL LIGHT 100 % EX OIL
TOPICAL_OIL | CUTANEOUS | Status: DC | PRN
  Administered 2015-12-05: 1 via TOPICAL

## 2015-12-05 MED ORDER — ACETAMINOPHEN 325 MG PO TABS: 650.0000 mg | ORAL_TABLET | ORAL | Status: DC | PRN

## 2015-12-05 SURGICAL SUPPLY — 77 items
APL SKNCLS STERI-STRIP NONHPOA (GAUZE/BANDAGES/DRESSINGS) ×1
BENZOIN TINCTURE PRP APPL 2/3 (GAUZE/BANDAGES/DRESSINGS) ×2 IMPLANT
BUR ROUND PRECISION 4.0 (BURR) ×2 IMPLANT
BUR ROUND PRECISION 4.0MM (BURR) ×1
CANISTER SUCTION 2500CC (MISCELLANEOUS) ×3 IMPLANT
CARTRIDGE OIL MAESTRO DRILL (MISCELLANEOUS) ×1 IMPLANT
CLOSURE STERI-STRIP 1/2X4 (GAUZE/BANDAGES/DRESSINGS) ×1
CLOSURE WOUND 1/2 X4 (GAUZE/BANDAGES/DRESSINGS)
CLSR STERI-STRIP ANTIMIC 1/2X4 (GAUZE/BANDAGES/DRESSINGS) ×1 IMPLANT
CORDS BIPOLAR (ELECTRODE) ×3 IMPLANT
COVER SURGICAL LIGHT HANDLE (MISCELLANEOUS) ×3 IMPLANT
DIFFUSER DRILL AIR PNEUMATIC (MISCELLANEOUS) ×3 IMPLANT
DRAIN CHANNEL 15F RND FF W/TCR (WOUND CARE) ×3 IMPLANT
DRAPE POUCH INSTRU U-SHP 10X18 (DRAPES) ×6 IMPLANT
DRAPE SURG 17X23 STRL (DRAPES) ×12 IMPLANT
DURAPREP 26ML APPLICATOR (WOUND CARE) ×3 IMPLANT
ELECT BLADE 4.0 EZ CLEAN MEGAD (MISCELLANEOUS) ×3
ELECT CAUTERY BLADE 6.4 (BLADE) ×3 IMPLANT
ELECT REM PT RETURN 9FT ADLT (ELECTROSURGICAL) ×3
ELECTRODE BLDE 4.0 EZ CLN MEGD (MISCELLANEOUS) ×1 IMPLANT
ELECTRODE REM PT RTRN 9FT ADLT (ELECTROSURGICAL) ×1 IMPLANT
EVACUATOR SILICONE 100CC (DRAIN) ×2 IMPLANT
FILTER STRAW FLUID ASPIR (MISCELLANEOUS) ×3 IMPLANT
GAUZE SPONGE 4X4 12PLY STRL (GAUZE/BANDAGES/DRESSINGS) ×3 IMPLANT
GAUZE SPONGE 4X4 16PLY XRAY LF (GAUZE/BANDAGES/DRESSINGS) ×6 IMPLANT
GLOVE BIO SURGEON STRL SZ7 (GLOVE) ×3 IMPLANT
GLOVE BIO SURGEON STRL SZ8 (GLOVE) ×3 IMPLANT
GLOVE BIOGEL PI IND STRL 7.0 (GLOVE) ×1 IMPLANT
GLOVE BIOGEL PI IND STRL 8 (GLOVE) ×1 IMPLANT
GLOVE BIOGEL PI INDICATOR 7.0 (GLOVE) ×2
GLOVE BIOGEL PI INDICATOR 8 (GLOVE) ×2
GOWN STRL REUS W/ TWL LRG LVL3 (GOWN DISPOSABLE) ×1 IMPLANT
GOWN STRL REUS W/ TWL XL LVL3 (GOWN DISPOSABLE) ×2 IMPLANT
GOWN STRL REUS W/TWL LRG LVL3 (GOWN DISPOSABLE) ×3
GOWN STRL REUS W/TWL XL LVL3 (GOWN DISPOSABLE) ×6
IV CATH 14GX2 1/4 (CATHETERS) ×3 IMPLANT
KIT BASIN OR (CUSTOM PROCEDURE TRAY) ×3 IMPLANT
KIT POSITION SURG JACKSON T1 (MISCELLANEOUS) ×3 IMPLANT
KIT ROOM TURNOVER OR (KITS) ×3 IMPLANT
NDL 18GX1X1/2 (RX/OR ONLY) (NEEDLE) ×1 IMPLANT
NDL HYPO 25GX1X1/2 BEV (NEEDLE) ×1 IMPLANT
NDL SPNL 18GX3.5 QUINCKE PK (NEEDLE) ×2 IMPLANT
NEEDLE 18GX1X1/2 (RX/OR ONLY) (NEEDLE) ×3 IMPLANT
NEEDLE 22X1 1/2 (OR ONLY) (NEEDLE) ×3 IMPLANT
NEEDLE HYPO 25GX1X1/2 BEV (NEEDLE) ×3 IMPLANT
NEEDLE SPNL 18GX3.5 QUINCKE PK (NEEDLE) ×6 IMPLANT
NS IRRIG 1000ML POUR BTL (IV SOLUTION) ×3 IMPLANT
OIL CARTRIDGE MAESTRO DRILL (MISCELLANEOUS) ×3
PACK LAMINECTOMY ORTHO (CUSTOM PROCEDURE TRAY) ×3 IMPLANT
PACK UNIVERSAL I (CUSTOM PROCEDURE TRAY) ×3 IMPLANT
PAD ARMBOARD 7.5X6 YLW CONV (MISCELLANEOUS) ×6 IMPLANT
PATTIES SURGICAL .5 X.5 (GAUZE/BANDAGES/DRESSINGS) IMPLANT
PATTIES SURGICAL .5 X1 (DISPOSABLE) ×3 IMPLANT
SPONGE GAUZE 4X4 12PLY STER LF (GAUZE/BANDAGES/DRESSINGS) ×2 IMPLANT
SPONGE INTESTINAL PEANUT (DISPOSABLE) ×3 IMPLANT
SPONGE SURGIFOAM ABS GEL 100 (HEMOSTASIS) ×3 IMPLANT
SPONGE SURGIFOAM ABS GEL SZ50 (HEMOSTASIS) ×3 IMPLANT
STRIP CLOSURE SKIN 1/2X4 (GAUZE/BANDAGES/DRESSINGS) IMPLANT
SURGIFLO W/THROMBIN 8M KIT (HEMOSTASIS) ×2 IMPLANT
SUT MNCRL AB 4-0 PS2 18 (SUTURE) ×3 IMPLANT
SUT VIC AB 0 CT1 18XCR BRD 8 (SUTURE) IMPLANT
SUT VIC AB 0 CT1 27 (SUTURE)
SUT VIC AB 0 CT1 27XBRD ANBCTR (SUTURE) IMPLANT
SUT VIC AB 0 CT1 8-18 (SUTURE)
SUT VIC AB 1 CT1 18XCR BRD 8 (SUTURE) ×1 IMPLANT
SUT VIC AB 1 CT1 8-18 (SUTURE) ×3
SUT VIC AB 2-0 CT2 18 VCP726D (SUTURE) ×3 IMPLANT
SYR 20CC LL (SYRINGE) ×3 IMPLANT
SYR BULB IRRIGATION 50ML (SYRINGE) ×3 IMPLANT
SYR CONTROL 10ML LL (SYRINGE) ×6 IMPLANT
SYR TB 1ML 26GX3/8 SAFETY (SYRINGE) ×6 IMPLANT
SYR TB 1ML LUER SLIP (SYRINGE) ×6 IMPLANT
TAPE CLOTH SURG 4X10 WHT LF (GAUZE/BANDAGES/DRESSINGS) ×2 IMPLANT
TOWEL OR 17X24 6PK STRL BLUE (TOWEL DISPOSABLE) ×3 IMPLANT
TOWEL OR 17X26 10 PK STRL BLUE (TOWEL DISPOSABLE) ×3 IMPLANT
WATER STERILE IRR 1000ML POUR (IV SOLUTION) ×3 IMPLANT
YANKAUER SUCT BULB TIP NO VENT (SUCTIONS) ×3 IMPLANT

## 2015-12-05 NOTE — Op Note (Signed)
[name], [name] NO.:  [id number]  MEDICAL RECORD NO.:  [id number]  LOCATION:  3C06C                        FACILITY:  [facility]  PHYSICIAN:  Phylliss Bob, MD      DATE OF BIRTH:  Mar 04, 1956  DATE OF PROCEDURE:  12/05/2015                              OPERATIVE REPORT   PREOPERATIVE DIAGNOSIS:  Spinal stenosis L2-3, L3-4.  POSTOPERATIVE DIAGNOSIS:  Spinal stenosis L2-3, L3-4.  PROCEDURE:  Lumbar decompression including central laminectomy and bilateral partial facetectomy and bilateral foraminotomy, L2-3, L3-4.  SURGEON:  Phylliss Bob, MD  ASSISTANT:  Pricilla Holm, PA-C  ANESTHESIA:  General endotracheal anesthesia.  COMPLICATIONS:  None.  DISPOSITION:  Stable.  ESTIMATED BLOOD LOSS:  200 mL.  INDICATIONS FOR SURGERY:  Briefly, [name] is a pleasant 60 year old male, who is status post a work injury.  I did see him as a 2nd opinion. The patient's MRI was clearly notable for spinal stenosis at L2-3 and L3- 4.  There was also noted to be an extruded disk fragment on the right side at the L3-4 intervertebral space, migrated superiorly behind the L3 vertebral body.  The patient did have symptoms very consistent with neurogenic claudication including bilateral leg pain when he stands and walks.  Given the patient's ongoing symptoms and lack of improvement with appropriate conservative care, we did discuss proceeding with the procedure noted above.  The patient was fully aware of the risk for subsequent pathology at any point after his surgery, which may require additional surgery, including the potential fusion procedure.  The patient did have a minor lumbar scoliosis curvature, which I did not feel was significant enough to warrant a fusion in combination with the patient's decompression.  Also of note, the patient was instructed to discontinue anti-inflammatories or aspirin for 1 week leading up to the procedure.  However, the patient did report  that he did take aspirin as recently as Sunday, 3 days prior to surgery.  I did elect to proceed with the surgery, but did inform the patient that his results, a drain will need to be placed, and he will need to be admitted overnight for observation with likely discharge the following day.  He did elect to proceed.  OPERATIVE DETAILS:  On December 05, 2015, patient was brought to surgery and general endotracheal anesthesia was administered.  The patient was placed prone on a well-padded flat Jackson bed with a Wilson frame. Antibiotics were given.  The back was prepped and draped and a time-out procedure was performed.  A midline incision was made overlying the L2- L4 region.  The fascia was incised at the midline and the paraspinal musculature was bluntly swept laterally and retracted.  I then removed the spinous processes of L2 and L3.  I then used a high-speed bur to remove the central aspect of the L2 and L3 lamina.  I then used a series of Kerrison punches to perform a central decompression, which was then followed by bilateral lateral recess decompression on the right and on the left sides.  Bilateral neural foraminal decompression was also performed at the L2-3 and L3-4 levels.  Then, with my attention on the right side, I did  medially retract the traversing L4 nerve at the L3-4 intervertebral space.  There was noted to be an extruded disk fragment located behind the L3 vertebral body.  This was very much adherent to the vertebral body.  I did use a reverse-angled Epstein curette to displace multiple fragments away from the vertebral body, after which point they were removed using a pituitary.  In this fashion, I was able to thoroughly and completely decompress the L2-3 and L3-4 intervertebral spaces.  The wound was then copiously irrigated.  All bleeding was controlled using Surgiflo.  A 40 mg of Depo-Medrol was then introduced about the epidural space.  Of note, there were minor  areas of bleeding, which was adequately controlled, but given the fact that there was additional minor oozing, I did elect to place a #15 deep Blake drain. The fascia was then closed using #1 Vicryl.  The subcutaneous layer was closed using 2-0 Vicryl followed by 3-0 Monocryl.  Benzoin and Steri- Strips were applied followed by sterile dressing.  All instrument counts were correct at the termination of the procedure.  Of note, Pricilla Holm was my assistant throughout surgery, and did aid in retraction, suctioning, and closure from start to finish.     Phylliss Bob, MD     MD/MEDQ  D:  12/05/2015  T:  12/05/2015  Job:  [id number]

## 2015-12-05 NOTE — Anesthesia Procedure Notes (Signed)
Procedure Name: Intubation Date/Time: 12/05/2015 12:13 PM Performed by: Jenne Campus Pre-anesthesia Checklist: Patient identified, Emergency Drugs available, Suction available, Patient being monitored and Timeout performed Patient Re-evaluated:Patient Re-evaluated prior to inductionOxygen Delivery Method: Circle system utilized Preoxygenation: Pre-oxygenation with 100% oxygen Intubation Type: IV induction Ventilation: Mask ventilation without difficulty, Two handed mask ventilation required and Oral airway inserted - appropriate to patient size Grade View: Grade I Tube type: Oral Tube size: 7.5 mm Number of attempts: 1 Airway Equipment and Method: Stylet and Video-laryngoscopy Placement Confirmation: ETT inserted through vocal cords under direct vision,  positive ETCO2,  CO2 detector and breath sounds checked- equal and bilateral Secured at: 22 cm Tube secured with: Tape Dental Injury: Teeth and Oropharynx as per pre-operative assessment

## 2015-12-05 NOTE — Transfer of Care (Signed)
Immediate Anesthesia Transfer of Care Note  Patient: [name]  Procedure(s) Performed: Procedure(s): LUMBAR 2-3, LUMBAR 3-4 DECOMPRESSION  (N/A)  Patient Location: PACU  Anesthesia Type:General  Level of Consciousness: awake  Airway & Oxygen Therapy: Patient Spontanous Breathing and Patient connected to face mask oxygen  Post-op Assessment: Report given to RN and Post -op Vital signs reviewed and stable  Post vital signs: Reviewed and stable  Last Vitals:  Filed Vitals:   12/05/15 0921  BP: 137/69  Pulse: 81  Temp: 37.1 C  Resp: 18    Complications: No apparent anesthesia complications

## 2015-12-05 NOTE — Anesthesia Preprocedure Evaluation (Addendum)
Anesthesia Evaluation  Patient identified by MRN, date of birth, ID band Patient awake    Reviewed: Allergy & Precautions, NPO status , Patient's Chart, lab work & pertinent test results  History of Anesthesia Complications Negative for: history of anesthetic complications  Airway Mallampati: II  TM Distance: >3 FB Neck ROM: Full    Dental  (+) Teeth Intact, Dental Advisory Given   Pulmonary sleep apnea and Continuous Positive Airway Pressure Ventilation , Recent URI , Current Smoker,    breath sounds clear to auscultation       Cardiovascular hypertension, Pt. on medications + CAD and + CABG   Rhythm:Regular Rate:Normal     Neuro/Psych negative neurological ROS  negative psych ROS   GI/Hepatic Neg liver ROS, GERD  Medicated and Controlled,  Endo/Other  negative endocrine ROSMorbid obesity  Renal/GU negative Renal ROS     Musculoskeletal   Abdominal   Peds  Hematology   Anesthesia Other Findings   Reproductive/Obstetrics negative OB ROS                           Anesthesia Physical Anesthesia Plan  ASA: III  Anesthesia Plan: General   Post-op Pain Management:    Induction: Intravenous  Airway Management Planned: Oral ETT  Additional Equipment:   Intra-op Plan:   Post-operative Plan: Extubation in OR  Informed Consent: I have reviewed the patients History and Physical, chart, labs and discussed the procedure including the risks, benefits and alternatives for the proposed anesthesia with the patient or authorized representative who has indicated his/her understanding and acceptance.   Dental advisory given  Plan Discussed with: CRNA and Anesthesiologist  Anesthesia Plan Comments:         Anesthesia Quick Evaluation

## 2015-12-05 NOTE — Anesthesia Postprocedure Evaluation (Signed)
Anesthesia Post Note  Patient: [name]  Procedure(s) Performed: Procedure(s) (LRB): LUMBAR 2-3, LUMBAR 3-4 DECOMPRESSION  (N/A)  Patient location during evaluation: PACU Anesthesia Type: General Level of consciousness: awake and awake and alert Pain management: pain level controlled Vital Signs Assessment: post-procedure vital signs reviewed and stable Respiratory status: spontaneous breathing and nonlabored ventilation Anesthetic complications: no    Last Vitals:  Filed Vitals:   12/05/15 1739 12/05/15 2000  BP: 138/79 138/71  Pulse: 87 90  Temp: 36.4 C 36.6 C  Resp: 16 20    Last Pain:  Filed Vitals:   12/05/15 2103  PainSc: Sandy

## 2015-12-06 ENCOUNTER — Encounter (HOSPITAL_COMMUNITY): Payer: Self-pay | Admitting: Orthopedic Surgery

## 2015-12-06 DIAGNOSIS — M4806 Spinal stenosis, lumbar region: Secondary | ICD-10-CM | POA: Diagnosis not present

## 2015-12-06 NOTE — Progress Notes (Signed)
    Patient doing well Patient denies R or L leg pain Patient has been ambulating well without leg pain Patient reports an obvious difference vs his preop pain   Physical Exam: Filed Vitals:   12/05/15 2327 12/06/15 0400  BP: 107/55 107/62  Pulse: 71 74  Temp: 98.6 F (37 C) 98.1 F (36.7 C)  Resp: 18 20   Patient looks comfortable Dressing in place NVI  Drain output: 60,100 (100 over past 10 hours)  POD #1 s/p L2-4 decompression, doing very well, admitted due to taking ASA prior to surgery for drain monitoring  - encourage ambulation - Percocet for pain, Valium for muscle spasms - likely d/c home later today, but would like to monitor drain output for next few hours prior to discharge, may d/c drain and d/c patient home if output decreases

## 2015-12-06 NOTE — Progress Notes (Signed)
Pt doing well. Pt and wife given D/C instructions with Rx's, verbal understanding was provided. Pt's JP drain was removed per MD order. Pt's dressing is clean and dry with no sign of infection. Pt D/C'd home via walking @ 1400 per MD order. Pt is stable @ D/C and has no other needs at this time. Holli Humbles, RN

## 2015-12-13 NOTE — Discharge Summary (Signed)
Patient ID: [name] MRN: [id number] DOB/AGE: 62-Jul-1957 62 y.o.  Admit date: 12/05/2015 Discharge date: 12/06/2015  Admission Diagnoses:  Active Problems:   Neurogenic claudication   Discharge Diagnoses:  Same  Past Medical History  Diagnosis Date  . HYPERTRIGLYCERIDEMIA   . HYPERTENSION   . HYPERLIPIDEMIA   . HEMORRHOIDS   . CORONARY ARTERY DISEASE     a. s/p stent to RCA 2005;  b. Last LHC 12/07: Mid LAD 40%, proximal circumflex 20%, mid circumflex 95%, mid RCA stent okay, normal LV function. PCI: Taxus DES to the [facility];  c.  Myoview 10/11: EF 56%, no ischemia or scar    . OBESITY   . GERD   . DYSPHAGIA UNSPECIFIED     intermittent  . DIVERTICULOSIS, COLON   . COLONIC POLYPS, HX OF   . CHEST PAIN   . SLEEP APNEA     on CPAP    Surgeries: Procedure(s): LUMBAR 2-3, LUMBAR 3-4 DECOMPRESSION  on 12/05/2015   Consultants:  None  Discharged Condition: Improved  Hospital Course: [name] is an 60 y.o. male who was admitted 12/05/2015 for operative treatment of spinal stenosis. Patient has severe unremitting pain that affects sleep, daily activities, and work/hobbies. After pre-op clearance the patient was taken to the operating room on 12/05/2015 and underwent  Procedure(s): LUMBAR 2-3, LUMBAR 3-4 DECOMPRESSION .    Patient was given perioperative antibiotics:  Anti-infectives    Start     Dose/Rate Route Frequency Ordered Stop   12/05/15 2000  ceFAZolin (ANCEF) IVPB 1 g/50 mL premix     1 g 100 mL/hr over 30 Minutes Intravenous Every 8 hours 12/05/15 1748 12/06/15 0503   12/05/15 1130  ceFAZolin (ANCEF) IVPB 2 g/50 mL premix     2 g 100 mL/hr over 30 Minutes Intravenous To [facility]Stay [facility] 12/04/15 1051 12/05/15 1215       Patient was given sequential compression devices, early ambulation to prevent DVT.  Patient benefited maximally from hospital stay and there were no complications.    Recent vital signs: BP 128/78 mmHg  Pulse 78  Temp(Src) 98.3  F (36.8 C) (Oral)  Resp 18  Ht 5\' 9"  (1.753 m)  Wt 97.569 kg (215 lb 1.6 oz)  BMI 31.75 kg/m2  SpO2 97%  Discharge Medications:     Medication List    STOP taking these medications        aspirin 325 MG tablet      TAKE these medications        losartan-hydrochlorothiazide 100-12.5 MG tablet  Commonly known as:  HYZAAR  Take 1 tablet by mouth daily.     omeprazole 20 MG capsule  Commonly known as:  PRILOSEC  Take 20 mg by mouth daily.        Diagnostic Studies: Dg Lumbar Spine 2-3 Views  12/05/2015  CLINICAL DATA:  Lumbar decompression, L3-4. EXAM: LUMBAR SPINE - 2-3 VIEW COMPARISON:  None. FINDINGS: Two intraoperative cross-table lateral views of the lumbar spine are provided. On the final image, surgical instruments overlie the posterior elements of the L3 and L4 vertebral bodies. IMPRESSION: Surgical instruments overlying the posterior elements of the L3 and L4 vertebral bodies. Electronically Signed   By: Franki Cabot M.D.   On: 12/05/2015 15:52    Disposition: 01-Home or Self Care   POD #1 s/p L2-4 decompression, doing very well, admitted due to taking ASA prior to surgery for drain monitoring  - encourage ambulation - Percocet for pain,  Valium for muscle spasms -Written scripts for pain signed and in chart -D/C instructions sheet printed and in chart -D/C today  -F/U in office 2 weeks   Signed: Justice Britain 12/13/2015, 2:11 PM

## 2015-12-31 ENCOUNTER — Ambulatory Visit: Payer: Worker's Compensation | Admitting: Cardiovascular Disease

## 2016-01-09 NOTE — Progress Notes (Signed)
Patient ID: [name], male   DOB: Jul 08, 1956, 60 y.o.   MRN: [id number]   60 y.o.  seen post CABG 08/18/13  with Dr Cyndia Bent  CORONARY ARTERY BYPASS GRAFTING x4  LIMA to LAD  SVG to Diagonal 1  SVG to Obtuse Marginal 1  SVG to Posterior Descending  D/C on amiodarone for brief PAF Maint NSR Healed well Has not smoked since D/C     Some numbness in hands ? 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 There is a small , mild reversible defect in the infero-apical segments. .  LV Ejection Fraction: 59%. LV Wall Motion: NL LV Function; NL Wall Motion.   Unfortunately started smoking again after quitting for a year   Hyzaar started  as BP was running higher   Back pain better Saw Dr Othelia Pulling  instead of [name] and had 5 hr surgery for lumbar decompression 11/2015  Much improved   ROS: Denies fever, malais, weight loss, blurry vision, decreased visual acuity, cough, sputum, SOB, hemoptysis, pleuritic pain, palpitaitons, heartburn, abdominal pain, melena, lower extremity edema, claudication, or rash.  All other systems reviewed and negative  General: Affect appropriate Healthy:  appears stated age 60: normal Neck supple with no adenopathy JVP normal no bruits no thyromegaly Lungs clear with no wheezing and good diaphragmatic motion Heart:  S1/S2 no murmur, no rub, gallop or click PMI normal Abdomen: benighn, BS positve, no tenderness, no AAA no bruit.  No HSM or HJR Distal pulses intact with no bruits No edema Neuro non-focal Skin warm and dry No muscular weakness   Current Outpatient Prescriptions  Medication Sig Dispense Refill  . aspirin 325 MG tablet Take 325 mg by mouth daily.    [name] losartan-hydrochlorothiazide (HYZAAR) 100-12.5 MG tablet Take 1 tablet by mouth daily. 90 tablet 3  .  omeprazole (PRILOSEC) 20 MG capsule Take 20 mg by mouth daily.     No current facility-administered medications for this visit.    Allergies  Statins and Hydrocodone  Electrocardiogram:  9/14  SR rate 82 normal ECG 09/30/14  SR Rate 71 normal    10/01/15  SR rate 83  normal Q in lead 3   Assessment and Plan CAD/CABG:  Done 08/18/13  Low risk myovue 09/2014  Stable continue medical RX HTN:  Better with Hyzaar  Back:  F/u [place] this week to increase activity  Smoking:  CXR reviewed from 10/01/15 NAD     Jenkins Rouge

## 2016-01-10 ENCOUNTER — Encounter: Payer: Self-pay | Admitting: Cardiovascular Disease

## 2016-01-14 ENCOUNTER — Ambulatory Visit (INDEPENDENT_AMBULATORY_CARE_PROVIDER_SITE_OTHER): Payer: BC Managed Care – PPO | Admitting: Cardiovascular Disease

## 2016-01-14 ENCOUNTER — Encounter: Payer: Self-pay | Admitting: Cardiovascular Disease

## 2016-01-14 VITALS — BP 140/66 | HR 81 | Ht 69.0 in | Wt 219.0 lb

## 2016-01-14 DIAGNOSIS — I25810 Atherosclerosis of coronary artery bypass graft(s) without angina pectoris: Secondary | ICD-10-CM

## 2016-01-14 NOTE — Patient Instructions (Signed)

## 2016-09-10 ENCOUNTER — Other Ambulatory Visit: Payer: Self-pay | Admitting: Otolaryngology

## 2016-09-10 DIAGNOSIS — H903 Sensorineural hearing loss, bilateral: Secondary | ICD-10-CM

## 2016-09-10 DIAGNOSIS — H9311 Tinnitus, right ear: Secondary | ICD-10-CM

## 2016-09-23 ENCOUNTER — Ambulatory Visit
Admission: RE | Admit: 2016-09-23 | Discharge: 2016-09-23 | Disposition: A | Discharge status: Home / Self Care | Payer: BC Managed Care – PPO | Source: Ambulatory Visit | Attending: Otolaryngology | Admitting: Otolaryngology

## 2016-09-23 DIAGNOSIS — H903 Sensorineural hearing loss, bilateral: Secondary | ICD-10-CM

## 2016-09-23 DIAGNOSIS — H9311 Tinnitus, right ear: Secondary | ICD-10-CM

## 2016-09-23 MED ORDER — GADOBENATE DIMEGLUMINE 529 MG/ML IV SOLN
20.0000 mL | Freq: Once | INTRAVENOUS | Status: AC | PRN
  Administered 2016-09-23: 20 mL via INTRAVENOUS

## 2016-11-14 ENCOUNTER — Other Ambulatory Visit: Payer: Self-pay | Admitting: Cardiovascular Disease

## 2017-02-13 ENCOUNTER — Other Ambulatory Visit: Payer: Self-pay | Admitting: Cardiovascular Disease

## 2017-02-16 ENCOUNTER — Other Ambulatory Visit: Payer: Self-pay | Admitting: Cardiovascular Disease

## 2017-02-27 IMAGING — CR DG LUMBAR SPINE 2-3V
1 series · 1 of 1 positions shown · non-contrast
Comparison: None.

CLINICAL DATA: Lumbar decompression, L3-4.

EXAM:
LUMBAR SPINE - 2-3 VIEW

[lateral]
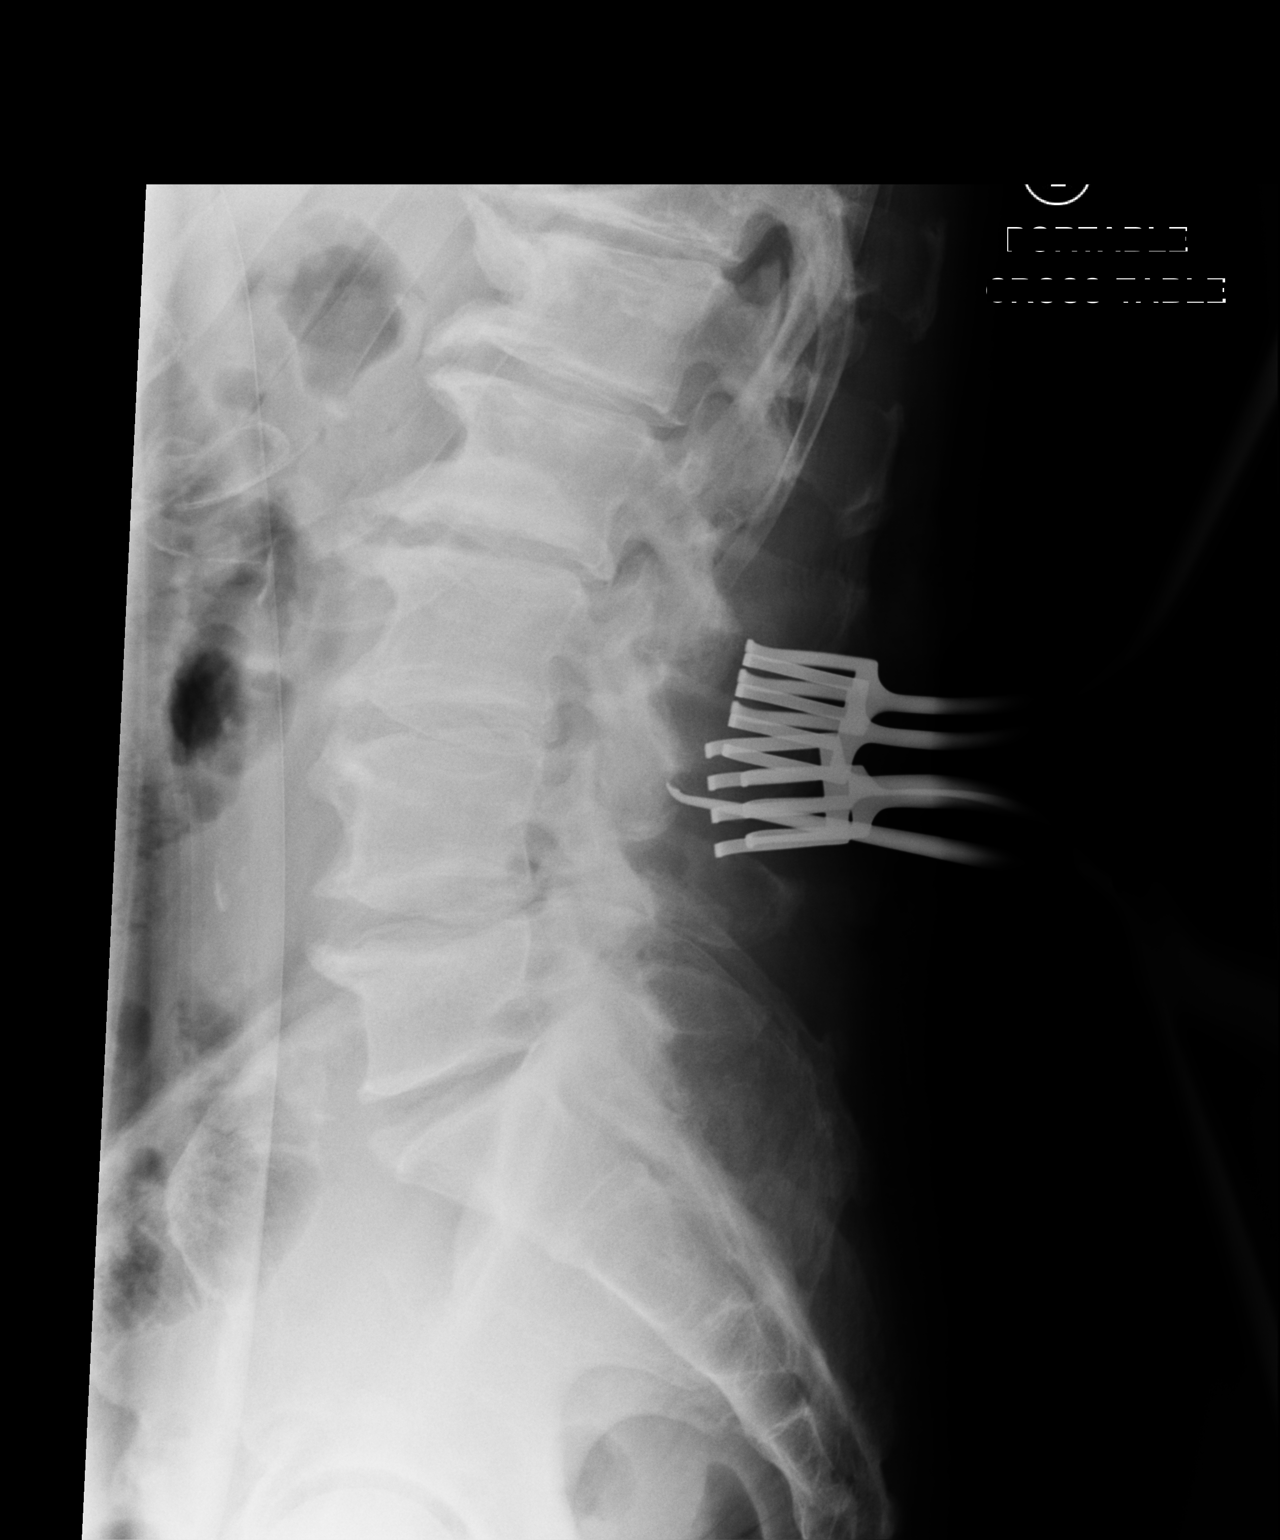

[1 of 1 positions shown; findings below may reference images not displayed]

FINDINGS: Two intraoperative cross-table lateral views of the lumbar spine are
provided. On the final image, surgical instruments overlie the
posterior elements of the L3 and L4 vertebral bodies.
IMPRESSION: Surgical instruments overlying the posterior elements of the L3 and
L4 vertebral bodies.

## 2017-03-10 ENCOUNTER — Emergency Department (HOSPITAL_COMMUNITY): Payer: BC Managed Care – PPO

## 2017-03-10 ENCOUNTER — Emergency Department (HOSPITAL_COMMUNITY)
Admission: EM | Admit: 2017-03-10 | Discharge: 2017-03-10 | Disposition: A | Discharge status: Home / Self Care | Payer: BC Managed Care – PPO | Attending: Emergency Medicine | Admitting: Emergency Medicine

## 2017-03-10 ENCOUNTER — Encounter (HOSPITAL_COMMUNITY): Payer: Self-pay | Admitting: Emergency Medicine

## 2017-03-10 DIAGNOSIS — R42 Dizziness and giddiness: Secondary | ICD-10-CM | POA: Insufficient documentation

## 2017-03-10 DIAGNOSIS — Z79899 Other long term (current) drug therapy: Secondary | ICD-10-CM | POA: Diagnosis not present

## 2017-03-10 DIAGNOSIS — F17210 Nicotine dependence, cigarettes, uncomplicated: Secondary | ICD-10-CM | POA: Diagnosis not present

## 2017-03-10 DIAGNOSIS — Z5181 Encounter for therapeutic drug level monitoring: Secondary | ICD-10-CM | POA: Insufficient documentation

## 2017-03-10 DIAGNOSIS — I10 Essential (primary) hypertension: Secondary | ICD-10-CM | POA: Diagnosis not present

## 2017-03-10 DIAGNOSIS — Z7982 Long term (current) use of aspirin: Secondary | ICD-10-CM | POA: Diagnosis not present

## 2017-03-10 DIAGNOSIS — Z951 Presence of aortocoronary bypass graft: Secondary | ICD-10-CM | POA: Insufficient documentation

## 2017-03-10 DIAGNOSIS — I2510 Atherosclerotic heart disease of native coronary artery without angina pectoris: Secondary | ICD-10-CM | POA: Insufficient documentation

## 2017-03-10 LAB — DIFFERENTIAL
Basophils Absolute: 0.1 10*3/uL (ref 0.0–0.1)
Basophils Relative: 1 %
Eosinophils Absolute: 0.4 10*3/uL (ref 0.0–0.7)
Eosinophils Relative: 8 %
Lymphocytes Relative: 21 %
Lymphs Abs: 1.1 10*3/uL (ref 0.7–4.0)
Monocytes Absolute: 0.5 10*3/uL (ref 0.1–1.0)
Monocytes Relative: 9 %
Neutro Abs: 3.3 10*3/uL (ref 1.7–7.7)
Neutrophils Relative %: 61 %

## 2017-03-10 LAB — URINALYSIS, ROUTINE W REFLEX MICROSCOPIC
Bilirubin Urine: NEGATIVE
Glucose, UA: NEGATIVE mg/dL
Hgb urine dipstick: NEGATIVE
Ketones, ur: NEGATIVE mg/dL
Nitrite: NEGATIVE
Protein, ur: NEGATIVE mg/dL
Specific Gravity, Urine: 1.005 (ref 1.005–1.030)
pH: 5 (ref 5.0–8.0)

## 2017-03-10 LAB — BASIC METABOLIC PANEL
ANION GAP: 6 (ref 5–15)
BUN: 18 mg/dL (ref 6–20)
CALCIUM: 8.8 mg/dL — AB (ref 8.9–10.3)
CO2: 22 mmol/L (ref 22–32)
CREATININE: 1.33 mg/dL — AB (ref 0.61–1.24)
Chloride: 110 mmol/L (ref 101–111)
GFR, EST NON AFRICAN AMERICAN: 56 mL/min — AB (ref >60)
GLUCOSE: 101 mg/dL — AB (ref 65–99)
Potassium: 3.9 mmol/L (ref 3.5–5.1)
Sodium: 138 mmol/L (ref 135–145)

## 2017-03-10 LAB — PROTIME-INR
INR: 1.08
Prothrombin Time: 14 seconds (ref 11.4–15.2)

## 2017-03-10 LAB — I-STAT CHEM 8, ED
BUN: 20 mg/dL (ref 6–20)
Calcium, Ion: 1.13 mmol/L — ABNORMAL LOW (ref 1.15–1.40)
Chloride: 109 mmol/L (ref 101–111)
Creatinine, Ser: 1.3 mg/dL — ABNORMAL HIGH (ref 0.61–1.24)
Glucose, Bld: 100 mg/dL — ABNORMAL HIGH (ref 65–99)
HCT: 41 % (ref 39.0–52.0)
Hemoglobin: 13.9 g/dL (ref 13.0–17.0)
Potassium: 3.9 mmol/L (ref 3.5–5.1)
Sodium: 142 mmol/L (ref 135–145)
TCO2: 22 mmol/L (ref 0–100)

## 2017-03-10 LAB — I-STAT TROPONIN, ED: Troponin i, poc: 0 ng/mL (ref 0.00–0.08)

## 2017-03-10 LAB — CBC
HCT: 41.6 % (ref 39.0–52.0)
Hemoglobin: 14.3 g/dL (ref 13.0–17.0)
MCH: 30.8 pg (ref 26.0–34.0)
MCHC: 34.4 g/dL (ref 30.0–36.0)
MCV: 89.7 fL (ref 78.0–100.0)
PLATELETS: 136 10*3/uL — AB (ref 150–400)
RBC: 4.64 MIL/uL (ref 4.22–5.81)
RDW: 12.4 % (ref 11.5–15.5)
WBC: 5.3 10*3/uL (ref 4.0–10.5)

## 2017-03-10 LAB — APTT: aPTT: 35 seconds (ref 24–36)

## 2017-03-10 LAB — ETHANOL: Alcohol, Ethyl (B): <5 mg/dL (ref <5)

## 2017-03-10 MED ORDER — MECLIZINE HCL 25 MG PO TABS
12.5000 mg | ORAL_TABLET | Freq: Once | ORAL | Status: AC
  Administered 2017-03-10: 12.5 mg via ORAL
  Filled 2017-03-10: qty 1

## 2017-03-10 MED ORDER — LOSARTAN POTASSIUM-HCTZ 100-12.5 MG PO TABS: 1.0000 | ORAL_TABLET | Freq: Every day | ORAL | 0 refills | Status: DC

## 2017-03-10 MED ORDER — SODIUM CHLORIDE 0.9 % IV BOLUS (SEPSIS)
1000.0000 mL | Freq: Once | INTRAVENOUS | Status: AC
  Administered 2017-03-10: 1000 mL via INTRAVENOUS

## 2017-03-10 MED ORDER — MECLIZINE HCL 25 MG PO TABS: 25.0000 mg | ORAL_TABLET | Freq: Three times a day (TID) | ORAL | 0 refills | Status: DC | PRN

## 2017-03-10 NOTE — ED Notes (Signed)
Patient transported to CT 

## 2017-03-10 NOTE — ED Provider Notes (Signed)
[facility] DEPT Provider Note   CSN: [id number] Arrival date & time: 03/10/17  0720     History   Chief Complaint Chief Complaint  Patient presents with  . Dizziness    HPI [name] is a 61 y.o. male with history of coronary artery disease, hypertension who presents with vertigo symptoms. Patient states he felt mildly dizzy yesterday, however when he got out of bed this morning, he states he felt very woozy. He continues to feel very unsteady. He denies the room spinning, but he feels like he has been "drinking too much." He states it is worse when he stands up, however he has the feeling even when he is lying in the bed. Patient has had some associated nausea. Patient denies any pain. He denies any chest pain, shortness of breath, headache, abdominal pain, vomiting, numbness or tingling. Patient notes that he has had ringing in his right ear for the past year. Patient also notes that he has had some nasal congestion over the past few days. He has seen ENT and had a negative MRI and November 2017. Patient ports is been out of his blood pressure medication for the past 3-4 days.  HPI  Past Medical History:  Diagnosis Date  . CHEST PAIN   . COLONIC POLYPS, HX OF   . CORONARY ARTERY DISEASE    a. s/p stent to RCA 2005;  b. Last LHC 12/07: Mid LAD 40%, proximal circumflex 20%, mid circumflex 95%, mid RCA stent okay, normal LV function. PCI: Taxus DES to the [facility];  c.  Myoview 10/11: EF 56%, no ischemia or scar    . DIVERTICULOSIS, COLON   . DYSPHAGIA UNSPECIFIED    intermittent  . GERD   . HEMORRHOIDS   . HYPERLIPIDEMIA   . HYPERTENSION   . HYPERTRIGLYCERIDEMIA   . OBESITY   . SLEEP APNEA    on CPAP    Patient Active Problem List   Diagnosis Date Noted  . 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 PAF (paroxysmal atrial fibrillation) ([place]) 09/10/2012  . Tobacco abuse 07/01/2012  . HYPERTRIGLYCERIDEMIA 06/19/2009  . Elevated lipids 06/19/2009  . OBESITY 06/19/2009  .  Essential hypertension 06/19/2009  . Chronic bronchitis ([place]) 06/19/2009  . CHEST PAIN 06/19/2009  . DYSPHAGIA UNSPECIFIED 06/19/2009  . Coronary atherosclerosis 03/01/2008  . HEMORRHOIDS 03/01/2008  . GERD 03/01/2008  . DIVERTICULOSIS, COLON 03/01/2008  . Obstructive sleep apnea 03/01/2008  . COLONIC POLYPS, HX OF 03/01/2008    Past Surgical History:  Procedure Laterality Date  . ACHILLES TENDON REPAIR    . BACK SURGERY     lower back  . CARDIAC CATHETERIZATION    . CORONARY ARTERY BYPASS GRAFT  08/18/2012   Procedure: CORONARY ARTERY BYPASS GRAFTING (CABG);  Surgeon: Gaye Pollack, MD;  Location: [facility];  Service: Open Heart Surgery;  Laterality: N/A;  CABG x four;  using left internal mammary artery and right leg greater saphenous vein harvested endoscopically  . KNEE ARTHROSCOPY     RIGHT  . LUMBAR LAMINECTOMY/DECOMPRESSION MICRODISCECTOMY N/A 12/05/2015   Procedure: LUMBAR 2-3, LUMBAR 3-4 DECOMPRESSION ;  Surgeon: Phylliss Bob, MD;  Location: [place];  Service: Orthopedics;  Laterality: N/A;  . Right long finger extensor tendon repair         Home Medications    Prior to Admission medications   Medication Sig Start Date End Date Taking? Authorizing Provider  aspirin 325 MG tablet Take 325 mg by mouth daily.   Yes [provider]  losartan-hydrochlorothiazide (HYZAAR) 100-12.5 MG tablet TAKE 1 TABLET BY MOUTH ONCE A DAY 02/13/17  Yes Josue Hector, MD  omeprazole (PRILOSEC) 20 MG capsule Take 20 mg by mouth daily.   Yes [provider]  Vitamins-Lipotropics (LIPO-FLAVONOID PLUS PO) Take 2 tablets by mouth 3 (three) times daily as needed. Ringing of the ears   Yes [provider]    Family History Family History  Problem Relation Age of Onset  . Cancer Mother   . Heart disease Brother     Social History Social History  Substance Use Topics  . Smoking status: Current Every Day Smoker    Packs/day: 1.50    Years: 43.00    Types:  Cigarettes    Last attempt to quit: 08/18/2012  . Smokeless tobacco: Not on file     Comment: Patient attempting to quit pt down to 1 ppd  . Alcohol use Yes     Comment: 1-2 drink occasionally     Allergies   Statins and Hydrocodone   Review of Systems Review of Systems  Constitutional: Negative for chills and fever.  HENT: Negative for facial swelling and sore throat.   Respiratory: Negative for shortness of breath.   Cardiovascular: Negative for chest pain.  Gastrointestinal: Positive for nausea. Negative for abdominal pain and vomiting.  Genitourinary: Negative for dysuria.  Musculoskeletal: Negative for back pain.  Skin: Negative for rash and wound.  Neurological: Positive for dizziness. Negative for syncope, speech difficulty, weakness, numbness and headaches.  Psychiatric/Behavioral: The patient is not nervous/anxious.      Physical Exam Updated Vital Signs BP (!) 154/83   Pulse (!) 56   Temp 98.3 F (36.8 C)   Resp 19   Ht 5\' 9"  (1.753 m)   Wt 97.5 kg   SpO2 96%   BMI 31.75 kg/m   Physical Exam  Constitutional: He appears well-developed and well-nourished. No distress.  HENT:  Head: Normocephalic and atraumatic.  Mouth/Throat: Oropharynx is clear and moist. No oropharyngeal exudate.  Eyes: Conjunctivae and EOM are normal. Pupils are equal, round, and reactive to light. Right eye exhibits no discharge. Left eye exhibits no discharge. No scleral icterus.  Neck: Normal range of motion. Neck supple. No thyromegaly present.  Cardiovascular: Normal rate, regular rhythm, normal heart sounds and intact distal pulses.  Exam reveals no gallop and no friction rub.   No murmur heard. Pulmonary/Chest: Effort normal and breath sounds normal. No stridor. No respiratory distress. He has no wheezes. He has no rales.  Abdominal: Soft. Bowel sounds are normal. He exhibits no distension. There is no tenderness. There is no rebound and no guarding.  Musculoskeletal: He exhibits  no edema.  Lymphadenopathy:    He has no cervical adenopathy.  Neurological: He is alert. Coordination normal.  CN 3-12 intact; normal sensation throughout; 5/5 strength in all 4 extremities; equal bilateral grip strength; no ataxia on finger to nose; normal heel-to-shin test; no symptom worsening or nystagmus with Dix-Hallpike maneuver; no nystagmus noted on EOMs  Skin: Skin is warm and dry. No rash noted. He is not diaphoretic. No pallor.  Psychiatric: He has a normal mood and affect.  Nursing note and vitals reviewed.    ED Treatments / Results  Labs (all labs ordered are listed, but only abnormal results are displayed) Labs Reviewed  BASIC METABOLIC PANEL - Abnormal; Notable for the following:       Result Value   Glucose, Bld 101 (*)    Creatinine, Ser 1.33 (*)  Calcium 8.8 (*)    GFR calc non Af Amer 56 (*)    All other components within normal limits  CBC - Abnormal; Notable for the following:    Platelets 136 (*)    All other components within normal limits  URINALYSIS, ROUTINE W REFLEX MICROSCOPIC - Abnormal; Notable for the following:    Leukocytes, UA TRACE (*)    Bacteria, UA RARE (*)    Squamous Epithelial / LPF 0-5 (*)    All other components within normal limits  I-STAT CHEM 8, ED - Abnormal; Notable for the following:    Creatinine, Ser 1.30 (*)    Glucose, Bld 100 (*)    Calcium, Ion 1.13 (*)    All other components within normal limits  ETHANOL  PROTIME-INR  APTT  DIFFERENTIAL  I-STAT TROPOININ, ED    EKG  EKG Interpretation  Date/Time:  Tuesday Mar 10 2017 07:26:39 EDT Ventricular Rate:  67 PR Interval:  194 QRS Duration: 86 QT Interval:  414 QTC Calculation: 437 R Axis:   73 Text Interpretation:  Normal sinus rhythm Normal ECG No significant change since last tracing Confirmed by Canary Brim  MD, MARTHA [phone]) on 03/10/2017 8:03:05 AM       Radiology Ct Head Wo Contrast  Result Date: 03/10/2017 CLINICAL DATA:  Acute dizziness EXAM: CT HEAD  WITHOUT CONTRAST TECHNIQUE: Contiguous axial images were obtained from the base of the skull through the vertex without intravenous contrast. COMPARISON:  09/23/2016 brain MR FINDINGS: Brain: No evidence of acute infarction, hemorrhage, hydrocephalus, extra-axial collection or mass lesion/mass effect. Vascular: No hyperdense vessel or unexpected calcification. Skull: Normal. Negative for fracture or focal lesion. Sinuses/Orbits: No acute finding. Other: None. IMPRESSION: Normal head CT without contrast Electronically Signed   By: Jerilynn Mages.  Shick M.D.   On: 03/10/2017 09:27   Mr Brain Wo Contrast  Result Date: 03/10/2017 CLINICAL DATA:  Awoke with dizziness this morning. EXAM: MRI HEAD WITHOUT CONTRAST TECHNIQUE: Multiplanar, multiecho pulse sequences of the brain and surrounding structures were obtained without intravenous contrast. COMPARISON:  Head CT earlier today.  Brain MRI 09/23/2016 FINDINGS: Brain: No acute infarction, hemorrhage, hydrocephalus, extra-axial collection or mass lesion. No atrophy or white matter disease. Vascular: Major flow voids are preserved Skull and upper cervical spine: Negative Sinuses/Orbits: No acute finding.  No sinusitis or mastoiditis. Other: On coronal diffusion imaging there is a 14 mm structure in the left parotid which on sagittal T1 weighted imaging 09/23/2016 had an ovoid appearance suggestive of incidental lymph node. IMPRESSION: Negative brain MRI. Electronically Signed   By: Monte Fantasia M.D.   On: 03/10/2017 12:24    Procedures Procedures (including critical care time)  Medications Ordered in ED Medications  meclizine (ANTIVERT) tablet 12.5 mg (12.5 mg Oral Given 03/10/17 0855)  sodium chloride 0.9 % bolus 1,000 mL (0 mLs Intravenous Stopped 03/10/17 1125)  meclizine (ANTIVERT) tablet 12.5 mg (12.5 mg Oral Given 03/10/17 1247)     Initial Impression / Assessment and Plan / ED Course  I have reviewed the triage vital signs and the nursing notes.  Pertinent  labs & imaging results that were available during my care of the patient were reviewed by me and considered in my medical decision making (see chart for details).     Patient with dizziness. Much improved with meclizine. CBC shows platelets 136K. BMP shows creatinine 1.33. Troponin 0. EKG shows NSR. UA shows trace leukocytes and rare bacteria. CT head and MRI of brain negative for stroke. No orthostatic hypotension  noted. Patient is ambulatory without difficulty prior to discharge. Suspect peripheral cause, potentially related to patient's chronic tinnitus. Will discharge patient home with meclizine and follow-up to ENT and neurology. I also recommended trying an over-the-counter decongestant, considering patient has had some nasal congestion lately that may be related. Return precautions discussed. Patient understands and agrees with plan. Patient vitals stable at discharge and discharged in satisfactory condition. I discussed patient case with Dr. Canary Brim who guided the patient's management and agrees with plan.   Final Clinical Impressions(s) / ED Diagnoses   Final diagnoses:  Vertigo    New Prescriptions New Prescriptions   No medications on file     Caryl Ada 03/10/17 [place], Martha, MD 03/10/17 1322

## 2017-03-10 NOTE — ED Notes (Signed)
Pt ambulated in hallway 558ft no assistance needed. States still feels swimmy headed but improved from when he first arrived. Pt with steady gait. Law, PA-c aware.

## 2017-03-10 NOTE — ED Notes (Signed)
1 unsuccessful attempt to draw blood; right hand

## 2017-03-10 NOTE — ED Notes (Signed)
Got patient ready for discharge took iv out

## 2017-03-10 NOTE — Discharge Instructions (Signed)
Medications: meclizine  Treatment: Take meclizine 3 times daily as needed for dizziness.  Follow-up: Please follow-up with your ear nose and throat doctor and/or a neurologist for further evaluation and treatment of your dizziness. Please return to the emergency department if you develop any new or worsening symptoms. Please see her primary care provider for further refills of your blood pressure medication.

## 2017-03-10 NOTE — ED Notes (Signed)
Patient denies pain and is resting comfortably.  

## 2017-03-10 NOTE — ED Triage Notes (Signed)
Pt sts woke this am with dizziness when got up from bed; pt denies other sx or "room spinning"

## 2017-03-10 NOTE — ED Notes (Signed)
Patient transported to MRI 

## 2017-03-25 NOTE — Progress Notes (Signed)
Cardiology Office Note   Date:  03/26/2017   ID:  [name], DOB 01/27/1956, MRN [id number]  PCP:  System, Pcp Not In  Cardiologist:  Dr. Johnsie Cancel    Chief Complaint  Patient presents with  . Coronary Artery Disease      History of Present Illness: [name] is a 61 y.o. male who presents for CAD with hx of CAD and CABG  X 4 in 2014 with Dr. Cyndia Bent he had brief PAF post op and was on amiodarone but is now off, maintaining SR.  He stopped smoking then, but resume a year later.   In 2015 he had low risk myoview.  EF 59%.    His BP has been controlled, but currently out of medication.  Today he has no chest pain or SOB to report. Recent vertigo and seen in ER.  Labs there are normal.  EKG without changes.  He continues to smoke 1 ppd and not yet ready to quit. He is aware  He is very active with farming.  He has sleep apnea and wears his cpap every night.   His BP is elevated but ran out of BP meds 2 days ago.  Will refill.  He refuses statins they cause pain and he has tried them all.  His ASA is 325 but we will decrease to 81 mg.  He is agreeable.        Past Medical History:  Diagnosis Date  . CHEST PAIN   . COLONIC POLYPS, HX OF   . CORONARY ARTERY DISEASE    a. s/p stent to RCA 2005;  b. Last LHC 12/07: Mid LAD 40%, proximal circumflex 20%, mid circumflex 95%, mid RCA stent okay, normal LV function. PCI: Taxus DES to the [facility];  c.  Myoview 10/11: EF 56%, no ischemia or scar    . DIVERTICULOSIS, COLON   . DYSPHAGIA UNSPECIFIED    intermittent  . GERD   . HEMORRHOIDS   . HYPERLIPIDEMIA   . HYPERTENSION   . HYPERTRIGLYCERIDEMIA   . OBESITY   . SLEEP APNEA    on CPAP    Past Surgical History:  Procedure Laterality Date  . ACHILLES TENDON REPAIR    . BACK SURGERY     lower back  . CARDIAC CATHETERIZATION    . CORONARY ARTERY BYPASS GRAFT  08/18/2012   Procedure: CORONARY ARTERY BYPASS GRAFTING (CABG);  Surgeon: Gaye Pollack, MD;  Location: [facility] [facility];   Service: Open Heart Surgery;  Laterality: N/A;  CABG x four;  using left internal mammary artery and right leg greater saphenous vein harvested endoscopically  . KNEE ARTHROSCOPY     RIGHT  . LUMBAR LAMINECTOMY/DECOMPRESSION MICRODISCECTOMY N/A 12/05/2015   Procedure: LUMBAR 2-3, LUMBAR 3-4 DECOMPRESSION ;  Surgeon: Phylliss Bob, MD;  Location: [place];  Service: Orthopedics;  Laterality: N/A;  . Right long finger extensor tendon repair       Current Outpatient Prescriptions  Medication Sig Dispense Refill  . aspirin 325 MG tablet Take 325 mg by mouth daily.    [name] losartan-hydrochlorothiazide (HYZAAR) 100-12.5 MG tablet Take 1 tablet by mouth daily. 15 tablet 0  . omeprazole (PRILOSEC) 20 MG capsule Take 20 mg by mouth daily.    . Vitamins-Lipotropics (LIPO-FLAVONOID PLUS PO) Take 2 tablets by mouth 3 (three) times daily as needed. Ringing of the ears     No current facility-administered medications for this visit.     Allergies:   Statins and Hydrocodone  Social History:  The patient  reports that he has been smoking Cigarettes.  He has a 64.50 pack-year smoking history. He has never used smokeless tobacco. He reports that he drinks alcohol. He reports that he does not use drugs.   Family History:  The patient's family history includes Cancer in his mother; Heart disease in his brother.    ROS:  General:no colds or fevers, no weight changes Skin:no rashes or ulcers HEENT:no blurred vision, no congestion CV:see HPI PUL:see HPI GI:no diarrhea constipation or melena, no indigestion GU:no hematuria, no dysuria MS:no joint pain, no claudication Neuro:no syncope, no lightheadedness Endo:no diabetes, no thyroid disease  Wt Readings from Last 3 Encounters:  03/26/17 217 lb 6.4 oz (98.6 kg)  03/10/17 215 lb (97.5 kg)  01/14/16 219 lb (99.3 kg)     PHYSICAL EXAM: VS:  BP (!) 178/80   Pulse 81   Ht 5\' 9"  (1.753 m)   Wt 217 lb 6.4 oz (98.6 kg)   SpO2 98%   BMI 32.10 kg/m  , BMI  Body mass index is 32.1 kg/m. General:Pleasant affect, NAD Skin:Warm and dry, brisk capillary refill HEENT:normocephalic, sclera clear, mucus membranes moist Neck:supple, no JVD, no bruits  Heart:S1S2 RRR without murmur, gallup, rub or click Lungs:clear without rales, rhonchi, or wheezes HYI:FOYD, non tender, + BS, do not palpate liver spleen or masses Ext:no lower ext edema, 2+ pedal pulses, 2+ radial pulses Neuro:alert and oriented X 3, MAE, follows commands, + facial symmetry    EKG:  EKG is NOT ordered today. Reviewed EKG from ER, SR no acute changes.    Recent Labs: 03/10/2017: BUN 20; Creatinine, Ser 1.30; Hemoglobin 13.9; Platelets 136; Potassium 3.9; Sodium 142    Lipid Panel    Component Value Date/Time   CHOL 188 11/06/2015 0826   TRIG 201 (H) 11/06/2015 0826   HDL 34 (L) 11/06/2015 0826   CHOLHDL 5.5 (H) 11/06/2015 0826   VLDL 40 (H) 11/06/2015 0826   LDLCALC 114 11/06/2015 0826   LDLDIRECT 77.2 07/10/2009 0852       Other studies Reviewed: Additional studies/ records that were reviewed today include: .  nuc 2015 no ischemia  ASSESSMENT AND PLAN:  1.  CAD with hx CABG 2014 and has done well since then, no chest pain.  Follow up with Dr. Johnsie Cancel in 6 months.  2. HTN, out of meds we sent in and he will resume today  3. HLD does not wish to take statins  4. Tobacco use not yet ready to stop, 1ppd we discussed.     Current medicines are reviewed with the patient today.  The patient Has no concerns regarding medicines.  The following changes have been made:  See above Labs/ tests ordered today include:see above  Disposition:   FU:  see above  Signed, Cecilie Kicks, NP  03/26/2017 8:16 AM    [facility], [place], [address] [place], [place] Phone: [phone]; Fax: [phone]

## 2017-03-26 ENCOUNTER — Encounter: Payer: Self-pay | Admitting: Cardiology

## 2017-03-26 ENCOUNTER — Ambulatory Visit (INDEPENDENT_AMBULATORY_CARE_PROVIDER_SITE_OTHER): Payer: BC Managed Care – PPO | Admitting: Cardiology

## 2017-03-26 VITALS — BP 178/80 | HR 81 | Ht 69.0 in | Wt 217.4 lb

## 2017-03-26 DIAGNOSIS — I25810 Atherosclerosis of coronary artery bypass graft(s) without angina pectoris: Secondary | ICD-10-CM | POA: Diagnosis not present

## 2017-03-26 DIAGNOSIS — I10 Essential (primary) hypertension: Secondary | ICD-10-CM

## 2017-03-26 DIAGNOSIS — E782 Mixed hyperlipidemia: Secondary | ICD-10-CM

## 2017-03-26 DIAGNOSIS — Z951 Presence of aortocoronary bypass graft: Secondary | ICD-10-CM | POA: Diagnosis not present

## 2017-03-26 DIAGNOSIS — F17200 Nicotine dependence, unspecified, uncomplicated: Secondary | ICD-10-CM

## 2017-03-26 DIAGNOSIS — IMO0001 Reserved for inherently not codable concepts without codable children: Secondary | ICD-10-CM

## 2017-03-26 MED ORDER — LOSARTAN POTASSIUM-HCTZ 100-12.5 MG PO TABS: 1.0000 | ORAL_TABLET | Freq: Every day | ORAL | 3 refills | Status: DC

## 2017-03-26 MED ORDER — ASPIRIN EC 81 MG PO TBEC: 81.0000 mg | DELAYED_RELEASE_TABLET | Freq: Every day | ORAL | 3 refills | Status: AC

## 2017-03-26 NOTE — Patient Instructions (Signed)
Medication Instructions:  DECREASE Aspirin to 81mg  daily.  Follow-Up: Your physician wants you to follow-up in: 6 months with Dr. Johnsie Cancel. You will receive a reminder letter in the mail two months in advance. If you don't receive a letter, please call our office to schedule the follow-up appointment.   Any Other Special Instructions Will Be Listed Below (If Applicable).     If you need a refill on your cardiac medications before your next appointment, please call your pharmacy.

## 2017-08-31 ENCOUNTER — Telehealth: Payer: Self-pay | Admitting: Cardiovascular Disease

## 2017-08-31 DIAGNOSIS — I2510 Atherosclerotic heart disease of native coronary artery without angina pectoris: Secondary | ICD-10-CM

## 2017-08-31 DIAGNOSIS — I10 Essential (primary) hypertension: Secondary | ICD-10-CM

## 2017-08-31 NOTE — Telephone Encounter (Signed)
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 Please call

## 2017-08-31 NOTE — Telephone Encounter (Signed)
Patient stated that Dr. 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 Patient wanted to make sure with Dr. Johnsie Cancel that this is necessary. Will forward to Dr. Johnsie Cancel.

## 2017-08-31 NOTE — Telephone Encounter (Signed)
Reasonable since still smoking and 3 years since last one  Can order exercise myovue

## 2017-09-01 ENCOUNTER — Telehealth (HOSPITAL_COMMUNITY): Payer: Self-pay | Admitting: *Deleted

## 2017-09-01 NOTE — Telephone Encounter (Signed)
Patient given instructions as written below.  Your physician has requested that you have a lexiscan myoview. For further information please visit [email or web address].   How to Prepare for Your Myoview Test:        A. Nothing to eat or drink 2 hours prior to arrival time, except you may drink water       B. No Caffeine/Decaffeinated products or chocolate 12 hours prior to arrival.       C. No Cologne or Lotion       D. Wear comfortable walking shoes       E. Total time is 3 to 4 hours; you may want to bring reading material for the waiting                   time. If someone comes with you, they will need to remain in the lobby due to                      limited space in the testing area.        F. Please report to [address] for your test.        G. Medication Instructions:   After You Arrive:  Once you arrive in the Nuclear Cardiology lab, an IV will be started, then the Technologist will inject a small amount of radioactive tracer. There will be a 1 hour waiting period after this injection. A series of pictures will be taken of your heart following this waiting period. You will be prepped for the stress portion of the test. During the stress portion of your test a small amount of radioactive tracer will be injected in your IV. After the stress portion, there is a short rest period during which time your heart and blood pressure will be monitored. After the short test period the Technologist will begin your second set of pictures. Your doctor will inform you of your test results within 7 days.  In preparation for your appointment, medication, and supplies will be purchased. Appointment availability is limited, so if you need to cancel or reschedule please call the Nuclear Department at [phone], 24 hours in advance to avoid a cancellation fee of $50.00

## 2017-09-01 NOTE — Telephone Encounter (Signed)
Patient given detailed instructions per Myocardial Perfusion Study Information Sheet for the test on 09/02/17 at 7:30. Patient notified to arrive 15 minutes early and that it is imperative to arrive on time for appointment to keep from having the test rescheduled.  If you need to cancel or reschedule your appointment, please call the office within 24 hours of your appointment. . Patient verbalized understanding.[name]

## 2017-09-01 NOTE — Telephone Encounter (Signed)
Called patient about Dr. Kyla Balzarine recommendations. Patient will have myoview done. Will send message to [facility] to have patient scheduled. Patient verbalized understanding.

## 2017-09-02 ENCOUNTER — Ambulatory Visit (HOSPITAL_COMMUNITY): Payer: BC Managed Care – PPO | Attending: Cardiovascular Disease

## 2017-09-02 DIAGNOSIS — I2510 Atherosclerotic heart disease of native coronary artery without angina pectoris: Secondary | ICD-10-CM | POA: Insufficient documentation

## 2017-09-02 DIAGNOSIS — I10 Essential (primary) hypertension: Secondary | ICD-10-CM | POA: Insufficient documentation

## 2017-09-02 LAB — MYOCARDIAL PERFUSION IMAGING
CHL CUP MPHR: 157 {beats}/min
CHL CUP NUCLEAR SDS: 0
CHL CUP NUCLEAR SSS: 5
CHL CUP RESTING HR STRESS: 72 {beats}/min
CSEPED: 6 min
CSEPEW: 7 METS
Exercise duration (sec): 0 s
LHR: 0.38
LV dias vol: 102 mL (ref 62–150)
LV sys vol: 41 mL
Peak HR: 137 {beats}/min
Percent HR: 87 %
SRS: 5
TID: 0.78

## 2017-09-02 MED ORDER — TECHNETIUM TC 99M TETROFOSMIN IV KIT
8.9000 | PACK | Freq: Once | INTRAVENOUS | Status: AC | PRN
  Administered 2017-09-02: 8.9 via INTRAVENOUS
  Filled 2017-09-02: qty 9

## 2017-09-02 MED ORDER — TECHNETIUM TC 99M TETROFOSMIN IV KIT
31.5000 | PACK | Freq: Once | INTRAVENOUS | Status: AC | PRN
  Administered 2017-09-02: 31.5 via INTRAVENOUS
  Filled 2017-09-02: qty 32

## 2017-09-10 ENCOUNTER — Encounter: Payer: Self-pay | Admitting: Pulmonary Disease

## 2017-09-10 ENCOUNTER — Ambulatory Visit: Payer: BC Managed Care – PPO | Admitting: Pulmonary Disease

## 2017-09-10 DIAGNOSIS — G4733 Obstructive sleep apnea (adult) (pediatric): Secondary | ICD-10-CM | POA: Diagnosis not present

## 2017-09-10 DIAGNOSIS — Z720 Tobacco use: Secondary | ICD-10-CM | POA: Diagnosis not present

## 2017-09-10 NOTE — Addendum Note (Signed)
Addended by: Valerie Salts on: 09/10/2017 04:50 PM   Modules accepted: Orders

## 2017-09-10 NOTE — Assessment & Plan Note (Signed)
Prescription for new CPAP 8 cm with nasal pillows, humidity [facility] 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  Weight loss encouraged, compliance with goal of at least 4-6 hrs every night is the expectation. Advised against medications with sedative side effects Cautioned against driving when sleepy - understanding that sleepiness will vary on a day to day basis

## 2017-09-10 NOTE — Patient Instructions (Signed)
Prescription for new CPAP 8 cm with nasal pillows, humidity [facility] to [facility]. Download follow-up in 1 month

## 2017-09-10 NOTE — Assessment & Plan Note (Signed)
Smoking cessation was again discussed. He has no intention of quitting and was not willing to commit to a quit attempt

## 2017-09-10 NOTE — Progress Notes (Signed)
Subjective:    Patient ID: [name], male    DOB: [birth year], 61 y.o.   MRN: [id number]  HPI  61 year old smoker presents for evaluation of obstructive sleep apnea. He was last seen in 2013. PSG 2000 showed severe OSA with AHI of 56/hour and severe oxygen desaturation. He underwent titration study in 2004 when he weighed 208 pounds, events were corrected by 8 cm of CPAP.  He has been maintained on nasal CPAP since then with good improvement somnolence and fatigue.  His last machine was obtained in 2013 and he has been very compliant with this.  He has a history of CAD status post CABG in 2013 and keeps up with Dr. Johnsie Cancel. Epworth sleepiness score is 8 and a denies excessive sleepiness or tiredness.  Wife is not noted snoring while on the machine.  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 Bedtime is around 9 PM, sleep latency is 30-45 minutes, he sleeps on his back with one pillow and often ends up on his side, reports 1-2 nocturnal awakenings including one bathroom visit and is out of bed by 5 AM.  Rested without dryness of mouth or headaches. He has gained about 10 pounds since his titration study in 2004.  He continues to smoke about a pack per day and has no intention of quitting. Chest x-ray 2016 did not show any infiltrates or effusions.[name] PFTs in 2015 did not show any evidence of airway obstruction.    Past Medical History:  Diagnosis Date  . CHEST PAIN   . COLONIC POLYPS, HX OF   . CORONARY ARTERY DISEASE    a. s/p stent to RCA 2005;  b. Last LHC 12/07: Mid LAD 40%, proximal circumflex 20%, mid circumflex 95%, mid RCA stent okay, normal LV function. PCI: Taxus DES to the [facility];  c.  Myoview 10/11: EF 56%, no ischemia or scar    . DIVERTICULOSIS, COLON   . DYSPHAGIA UNSPECIFIED    intermittent  . GERD   . HEMORRHOIDS   . HYPERLIPIDEMIA   . HYPERTENSION   . HYPERTRIGLYCERIDEMIA   . OBESITY   . SLEEP APNEA    on CPAP   Past Surgical  History:  Procedure Laterality Date  . ACHILLES TENDON REPAIR    . BACK SURGERY     lower back  . CARDIAC CATHETERIZATION    . CORONARY ARTERY BYPASS GRAFT  08/18/2012   Procedure: CORONARY ARTERY BYPASS GRAFTING (CABG);  Surgeon: Gaye Pollack, MD;  Location: [place];  Service: Open Heart Surgery;  Laterality: N/A;  CABG x four;  using left internal mammary artery and right leg greater saphenous vein harvested endoscopically  . KNEE ARTHROSCOPY     RIGHT  . LUMBAR LAMINECTOMY/DECOMPRESSION MICRODISCECTOMY N/A 12/05/2015   Procedure: LUMBAR 2-3, LUMBAR 3-4 DECOMPRESSION ;  Surgeon: Phylliss Bob, MD;  Location: [place];  Service: Orthopedics;  Laterality: N/A;  . Right long finger extensor tendon repair      Allergies  Allergen Reactions  . Statins Other (See Comments)    Bone and muscle pain  . Hydrocodone Nausea And Vomiting    Social History   Socioeconomic History  . Marital status: Married    Spouse name: Not on file  . Number of children: Not on file  . Years of education: Not on file  . Highest education level: Not on file  Social Needs  . Financial resource strain: Not on file  . Food insecurity - worry:  Not on file  . Food insecurity - inability: Not on file  . Transportation needs - medical: Not on file  . Transportation needs - non-medical: Not on file  Occupational History  . Not on file  Tobacco Use  . Smoking status: Current Every Day Smoker    Packs/day: 1.50    Years: 43.00    Pack years: 64.50    Types: Cigarettes    Last attempt to quit: 08/18/2012    Years since quitting: 5.0  . Smokeless tobacco: Never Used  . Tobacco comment: Patient attempting to quit pt down to 1 ppd  Substance and Sexual Activity  . Alcohol use: Yes    Comment: 1-2 drink occasionally  . Drug use: No  . Sexual activity: Not on file  Other Topics Concern  . Not on file  Social History Narrative  . Not on file       Family History  Problem Relation Age of Onset  . Cancer  Mother   . Heart disease Brother      Review of Systems  Positive for acid heartburn, nasal congestion, sneezing  Constitutional: negative for anorexia, fevers and sweats  Eyes: negative for irritation, redness and visual disturbance  Ears, nose, mouth, throat, and face: negative for earaches, epistaxis, nasal congestion and sore throat  Respiratory: negative for cough, dyspnea on exertion, sputum and wheezing  Cardiovascular: negative for chest pain, dyspnea, lower extremity edema, orthopnea, palpitations and syncope  Gastrointestinal: negative for abdominal pain, constipation, diarrhea, melena, nausea and vomiting  Genitourinary:negative for dysuria, frequency and hematuria  Hematologic/lymphatic: negative for bleeding, easy bruising and lymphadenopathy  Musculoskeletal:negative for arthralgias, muscle weakness and stiff joints  Neurological: negative for coordination problems, gait problems, headaches and weakness  Endocrine: negative for diabetic symptoms including polydipsia, polyuria and weight loss      Objective:   Physical Exam  Gen. Pleasant, obese, in no distress, normal affect ENT - no lesions, no post nasal drip, class 2 airway Neck: No JVD, no thyromegaly, no carotid bruits Lungs: no use of accessory muscles, no dullness to percussion, decreased without rales or rhonchi  Cardiovascular: Rhythm regular, heart sounds  normal, no murmurs or gallops, no peripheral edema Abdomen: soft and non-tender, no hepatosplenomegaly, BS normal. Musculoskeletal: No deformities, no cyanosis or clubbing Neuro:  alert, non focal, no tremors       Assessment & Plan:

## 2017-09-14 ENCOUNTER — Telehealth: Payer: Self-pay | Admitting: Cardiovascular Disease

## 2017-09-14 NOTE — Telephone Encounter (Signed)
Walk In Pt Form-Pt Dropped off [place] Form. That needs to be Completed placed in [place] Doc box.

## 2017-10-27 NOTE — Progress Notes (Signed)
Cardiology Office Note   Date:  11/05/2017   ID:  [name], DOB [birth year], MRN [id number]  PCP:  Susy Frizzle, MD  Cardiologist:  Dr. Johnsie Cancel    No chief complaint on file.     History of Present Illness: [name] is a 62 y.o. male who presents for CAD with hx of CAD and CABG  X 4 in 2014 with Dr. Cyndia Bent he had brief PAF post op and was on amiodarone but is now off, maintaining SR.  He is a current daily smoker and reticent to quit He does not want To take statins they all cause "pain"  and has run out of his BP meds in past   Last myovue 09/02/17 with old inferolateral infarct no ischemia low risk EF 59%  Today he has no chest pain or SOB to report.   He is very active with farming.  He has sleep apnea and wears his cpap every night. Had the flu but recovered will get flu shot next year .        Past Medical History:  Diagnosis Date  . CHEST PAIN   . COLONIC POLYPS, HX OF   . CORONARY ARTERY DISEASE    a. s/p stent to RCA 2005;  b. Last LHC 12/07: Mid LAD 40%, proximal circumflex 20%, mid circumflex 95%, mid RCA stent okay, normal LV function. PCI: Taxus DES to the [facility];  c.  Myoview 10/11: EF 56%, no ischemia or scar    . DIVERTICULOSIS, COLON   . DYSPHAGIA UNSPECIFIED    intermittent  . GERD   . HEMORRHOIDS   . HYPERLIPIDEMIA   . HYPERTENSION   . HYPERTRIGLYCERIDEMIA   . OBESITY   . SLEEP APNEA    on CPAP    Past Surgical History:  Procedure Laterality Date  . ACHILLES TENDON REPAIR    . BACK SURGERY     lower back  . CARDIAC CATHETERIZATION    . CORONARY ARTERY BYPASS GRAFT  08/18/2012   Procedure: CORONARY ARTERY BYPASS GRAFTING (CABG);  Surgeon: Gaye Pollack, MD;  Location: [place];  Service: Open Heart Surgery;  Laterality: N/A;  CABG x four;  using left internal mammary artery and right leg greater saphenous vein harvested endoscopically  . KNEE ARTHROSCOPY     RIGHT  . LUMBAR LAMINECTOMY/DECOMPRESSION MICRODISCECTOMY N/A 12/05/2015   Procedure: LUMBAR 2-3, LUMBAR 3-4 DECOMPRESSION ;  Surgeon: Phylliss Bob, MD;  Location: [place];  Service: Orthopedics;  Laterality: N/A;  . Right long finger extensor tendon repair       Current Outpatient Medications  Medication Sig Dispense Refill  . aspirin EC 81 MG tablet Take 1 tablet (81 mg total) by mouth daily. 90 tablet 3  . dextromethorphan (DELSYM) 30 MG/5ML liquid Take 5 mLs (30 mg total) by mouth 2 (two) times daily. 120 mL 0  . losartan-hydrochlorothiazide (HYZAAR) 100-12.5 MG tablet Take 1 tablet by mouth daily. 90 tablet 3  . omeprazole (PRILOSEC) 20 MG capsule Take 20 mg by mouth daily.     No current facility-administered medications for this visit.     Allergies:   Statins and Hydrocodone    Social History:  The patient  reports that he has been smoking cigarettes.  He has a 64.50 pack-year smoking history. he has never used smokeless tobacco. He reports that he drinks alcohol. He reports that he does not use drugs.   Family History:  The patient's family history includes Cancer in his  mother; Heart disease in his brother.    ROS:  General:no colds or fevers, no weight changes Skin:no rashes or ulcers HEENT:no blurred vision, no congestion CV:see HPI PUL:see HPI GI:no diarrhea constipation or melena, no indigestion GU:no hematuria, no dysuria MS:no joint pain, no claudication Neuro:no syncope, no lightheadedness Endo:no diabetes, no thyroid disease  Wt Readings from Last 3 Encounters:  11/05/17 217 lb 3.2 oz (98.5 kg)  11/03/17 215 lb (97.5 kg)  10/30/17 217 lb (98.4 kg)     PHYSICAL EXAM: VS:  BP (!) 142/72   Pulse 75   Ht 5\' 9"  (1.753 m)   Wt 217 lb 3.2 oz (98.5 kg)   SpO2 97%   BMI 32.07 kg/m  , BMI Body mass index is 32.07 kg/m. Affect appropriate Healthy:  appears stated age 27: normal Neck supple with no adenopathy JVP normal no bruits no thyromegaly Lungs clear with no wheezing and good diaphragmatic motion Heart:  S1/S2 no murmur,  no rub, gallop or click PMI normal Abdomen: benighn, BS positve, no tenderness, no AAA no bruit.  No HSM or HJR Distal pulses intact with no bruits No edema Neuro non-focal Skin warm and dry No muscular weakness    EKG:   03/10/17 SR rate 68 normal    Recent Labs: 03/10/2017: BUN 20; Creatinine, Ser 1.30; Hemoglobin 13.9; Platelets 136; Potassium 3.9; Sodium 142    Lipid Panel    Component Value Date/Time   CHOL 188 11/06/2015 0826   TRIG 201 (H) 11/06/2015 0826   HDL 34 (L) 11/06/2015 0826   CHOLHDL 5.5 (H) 11/06/2015 0826   VLDL 40 (H) 11/06/2015 0826   LDLCALC 114 11/06/2015 0826   LDLDIRECT 77.2 07/10/2009 0852       Other studies Reviewed: Additional studies/ records that were reviewed today include: .  nuc 2015 no ischemia  ASSESSMENT AND PLAN:  1.  CAD with hx CABG 2014 and has done well since then, no chest pain.  Myovue 09/02/17 With inferolateral scar old no ischemia EF 59%    2. HTN, Well controlled.  Continue current medications and low sodium Dash type diet.    3. HLD does not wish to take statins  4. Tobacco use not yet ready to stop, 1ppd we discussed.  Also discussed lung cancer screening CT  Will order next week    Jenkins Rouge

## 2017-10-30 ENCOUNTER — Ambulatory Visit: Payer: Self-pay | Admitting: Adult Health

## 2017-10-30 ENCOUNTER — Other Ambulatory Visit: Payer: Self-pay

## 2017-10-30 ENCOUNTER — Encounter: Payer: Self-pay | Admitting: Family Medicine

## 2017-10-30 ENCOUNTER — Ambulatory Visit: Payer: BC Managed Care – PPO | Admitting: Family Medicine

## 2017-10-30 VITALS — BP 132/68 | HR 74 | Temp 98.4°F | Resp 14 | Ht 69.0 in | Wt 217.0 lb

## 2017-10-30 DIAGNOSIS — J101 Influenza due to other identified influenza virus with other respiratory manifestations: Secondary | ICD-10-CM

## 2017-10-30 LAB — INFLUENZA A AND B AG, IMMUNOASSAY
INFLUENZA A ANTIGEN: DETECTED — AB
INFLUENZA B ANTIGEN: NOT DETECTED

## 2017-10-30 MED ORDER — DEXTROMETHORPHAN POLISTIREX ER 30 MG/5ML PO SUER: 30.0000 mg | Freq: Two times a day (BID) | ORAL | 0 refills | Status: DC

## 2017-10-30 MED ORDER — OSELTAMIVIR PHOSPHATE 75 MG PO CAPS
75.0000 mg | ORAL_CAPSULE | Freq: Two times a day (BID) | ORAL | 0 refills | Status: DC
Start: 2017-10-30 — End: 2017-11-03

## 2017-10-30 NOTE — Patient Instructions (Addendum)
Take Tamiflu Delsym for cough SCHEDULE PHYSICAL DR. PICKARD  Influenza, Adult Influenza ("the flu") is an infection in the lungs, nose, and throat (respiratory tract). It is caused by a virus. The flu causes many common cold symptoms, as well as a high fever and body aches. It can make you feel very sick. The flu spreads easily from person to person (is contagious). Getting a flu shot (influenza vaccination) every year is the best way to prevent the flu. Follow these instructions at home:  Take over-the-counter and prescription medicines only as told by your doctor.  Use a cool mist humidifier to add moisture (humidity) to the air in your home. This can make it easier to breathe.  Rest as needed.  Drink enough fluid to keep your pee (urine) clear or pale yellow.  Cover your mouth and nose when you cough or sneeze.  Wash your hands with soap and water often, especially after you cough or sneeze. If you cannot use soap and water, use hand sanitizer.  Stay home from work or school as told by your doctor. Unless you are visiting your doctor, try to avoid leaving home until your fever has been gone for 24 hours without the use of medicine.  Keep all follow-up visits as told by your doctor. This is important. How is this prevented?  Getting a yearly (annual) flu shot is the best way to avoid getting the flu. You may get the flu shot in late summer, fall, or winter. Ask your doctor when you should get your flu shot.  Wash your hands often or use hand sanitizer often.  Avoid contact with people who are sick during cold and flu season.  Eat healthy foods.  Drink plenty of fluids.  Get enough sleep.  Exercise regularly. Contact a doctor if:  You get new symptoms.  You have: ? Chest pain. ? Watery poop (diarrhea). ? A fever.  Your cough gets worse.  You start to have more mucus.  You feel sick to your stomach (nauseous).  You throw up (vomit). Get help right away  if:  You start to be short of breath or have trouble breathing.  Your skin or nails turn a bluish color.  You have very bad pain or stiffness in your neck.  You get a sudden headache.  You get sudden pain in your face or ear.  You cannot stop throwing up. This information is not intended to replace advice given to you by your health care provider. Make sure you discuss any questions you have with your health care provider. Document Released: 07/22/2008 Document Revised: 03/20/2016 Document Reviewed: 08/07/2015 Elsevier Interactive Patient Education  2017 [employer].

## 2017-10-30 NOTE — Progress Notes (Signed)
   Subjective:    Patient ID: [name], male    DOB: 1955/12/23, 62 y.o.   MRN: [id number]  Patient presents for Illness (x3 days- fever (T max 100), productive cough with white mucus, HA)   Pt here for with flu like symptoms, fever, cough, Headache. Sleeping most day,  No vomiting, no diarrhea    He does smoke    He only follows with Dr. Johnsie Cancel, cardiologist    Revie} GEN-  fatigue, +fever, weight loss,weakness, recent illness HEENT- denies eye drainage, change in vision, nasal discharge, CVS- denies chest pain, palpitations RESP- denies SOB, cough, wheeze ABD- denies N/V, change in stools, abd pain GU- denies dysuria, hematuria, dribbling, incontinence MSK- denies joint pain, muscle aches, injury Neuro- denies headache, dizziness, syncope, seizure activity       Objective:    BP 132/68   Pulse 74   Temp 98.4 F (36.9 C) (Oral)   Resp 14   Ht 5\' 9"  (1.753 m)   Wt 217 lb (98.4 kg)   SpO2 98%   BMI 32.05 kg/m  GEN- NAD, alert and oriented x3, ill appearing HEENT- PERRL, EOMI, non injected sclera, pink conjunctiva, MMM, oropharynx mild injection, TM clear bilat no effusion,  No maxillary sinus tenderness, inflammed turbinates,  Nasal drainage  Neck- Supple, no LAD CVS- RRR, no murmur RESP-CTAB EXT- No edema Pulses- Radial 2+         Assessment & Plan:      Problem List Items Addressed This Visit    None    Visit Diagnoses    Influenza A    -  Primary   Flu A postive, tamiflu, fluids, delsym for cough, rest, note for work   Relevant Medications   oseltamivir (TAMIFLU) 75 MG capsule   Other Relevant Orders   Influenza A & B PCR      Note: This dictation was prepared with Dragon dictation along with smaller phrase technology. Any transcriptional errors that result from this process are unintentional.

## 2017-11-03 ENCOUNTER — Ambulatory Visit: Payer: BC Managed Care – PPO | Admitting: Adult Health

## 2017-11-03 ENCOUNTER — Encounter: Payer: Self-pay | Admitting: Adult Health

## 2017-11-03 DIAGNOSIS — G4733 Obstructive sleep apnea (adult) (pediatric): Secondary | ICD-10-CM

## 2017-11-03 NOTE — Assessment & Plan Note (Signed)
Doing well  Adjust CPAP pressure for comfort   Plan  Patient Instructions  Decrease CPAP 7 cm .  Continue on CPAP At bedtime  .  Keep up good work.  Work on healthy weight  Do not drive is sleepy .  Follow up Dr. Elsworth Soho  In 1 year and As needed

## 2017-11-03 NOTE — Patient Instructions (Addendum)
Decrease CPAP 7 cm .  Continue on CPAP At bedtime  .  Keep up good work.  Work on healthy weight  Do not drive is sleepy .  Follow up Dr. Elsworth Soho  In 1 year and As needed

## 2017-11-03 NOTE — Addendum Note (Signed)
Addended by: Parke Poisson E on: 11/03/2017 04:39 PM   Modules accepted: Orders

## 2017-11-03 NOTE — Progress Notes (Signed)
@Patient  ID: [name], male    DOB: 1956-01-21, 62 y.o.   MRN: [id number]  Chief Complaint  Patient presents with  . Follow-up    OSA     Referring provider: Susy Frizzle, MD  HPI: 62 yo male smoker followed for OSA   TEST  PSG 2000 severe OSA with AHI 56/hr   11/03/2017 Follow up ; OSA  Pt presents for a follow up for severe OSA . He is on CPAP At bedtime  . Recently got new CPAP machine . Doing well on CPAP with no significant daytime sleepiness.  Download shows excellent compliance with Avg usage at 7.5hr. AHI 2.0 . Minimal leaks.  Discussed wt loss. Feels the pressure may be too high through nasal pillows.   Had flu last week . Feeling better after tamiflu .     Allergies  Allergen Reactions  . Statins Other (See Comments)    Bone and muscle pain  . Hydrocodone Nausea And Vomiting    Immunization History  Administered Date(s) Administered  . Influenza Split 08/23/2012  . Td 02/24/1991    Past Medical History:  Diagnosis Date  . CHEST PAIN   . COLONIC POLYPS, HX OF   . CORONARY ARTERY DISEASE    a. s/p stent to RCA 2005;  b. Last LHC 12/07: Mid LAD 40%, proximal circumflex 20%, mid circumflex 95%, mid RCA stent okay, normal LV function. PCI: Taxus DES to the [facility];  c.  Myoview 10/11: EF 56%, no ischemia or scar    . DIVERTICULOSIS, COLON   . DYSPHAGIA UNSPECIFIED    intermittent  . GERD   . HEMORRHOIDS   . HYPERLIPIDEMIA   . HYPERTENSION   . HYPERTRIGLYCERIDEMIA   . OBESITY   . SLEEP APNEA    on CPAP    Tobacco History: Social History   Tobacco Use  Smoking Status Current Every Day Smoker  . Packs/day: 1.50  . Years: 43.00  . Pack years: 64.50  . Types: Cigarettes  . Last attempt to quit: 08/18/2012  . Years since quitting: 5.2  Smokeless Tobacco Never Used  Tobacco Comment   Patient attempting to quit pt down to 1 ppd   Ready to quit: No Counseling given: Yes Comment: Patient attempting to quit pt down to 1 ppd   Outpatient  Encounter Medications as of 11/03/2017  Medication Sig  . aspirin EC 81 MG tablet Take 1 tablet (81 mg total) by mouth daily.  [name] dextromethorphan (DELSYM) 30 MG/5ML liquid Take 5 mLs (30 mg total) by mouth 2 (two) times daily.  [name] losartan-hydrochlorothiazide (HYZAAR) 100-12.5 MG tablet Take 1 tablet by mouth daily.  [name] omeprazole (PRILOSEC) 20 MG capsule Take 20 mg by mouth daily.  . [DISCONTINUED] oseltamivir (TAMIFLU) 75 MG capsule Take 1 capsule (75 mg total) by mouth 2 (two) times daily.   No facility-administered encounter medications on file as of 11/03/2017.      Review of Systems  Constitutional:   No  weight loss, night sweats,  Fevers, chills, fatigue, or  lassitude.  HEENT:   No headaches,  Difficulty swallowing,  Tooth/dental problems, or  Sore throat,                No sneezing, itching, ear ache, nasal congestion, post nasal drip,   CV:  No chest pain,  Orthopnea, PND, swelling in lower extremities, anasarca, dizziness, palpitations, syncope.   GI  No heartburn, indigestion, abdominal pain, nausea, vomiting, diarrhea, change in bowel habits,  loss of appetite, bloody stools.   Resp: No shortness of breath with exertion or at rest.  No excess mucus, no productive cough,  No non-productive cough,  No coughing up of blood.  No change in color of mucus.  No wheezing.  No chest wall deformity  Skin: no rash or lesions.  GU: no dysuria, change in color of urine, no urgency or frequency.  No flank pain, no hematuria   MS:  No joint pain or swelling.  No decreased range of motion.  No back pain.    Physical Exam  BP 116/68 (BP Location: Left Arm, Patient Position: Sitting, Cuff Size: Normal)   Pulse 78   Ht 5\' 9"  (1.753 m)   Wt 215 lb (97.5 kg)   SpO2 96%   BMI 31.75 kg/m   GEN: A/Ox3; pleasant , NAD, well nourished    HEENT:  [place]/AT,  EACs-clear, TMs-wnl, NOSE-clear, THROAT-clear, no lesions, no postnasal drip or exudate noted. Class 2-3 MP airway   NECK:  Supple w/  fair ROM; no JVD; normal carotid impulses w/o bruits; no thyromegaly or nodules palpated; no lymphadenopathy.    RESP  Clear  P & A; w/o, wheezes/ rales/ or rhonchi. no accessory muscle use, no dullness to percussion  CARD:  RRR, no m/r/g, no peripheral edema, pulses intact, no cyanosis or clubbing.  GI:   Soft & nt; nml bowel sounds; no organomegaly or masses detected.   Musco: Warm bil, no deformities or joint swelling noted.   Neuro: alert, no focal deficits noted.    Skin: Warm, no lesions or rashes    Lab Results:  CBC   BNP No results found for: BNP  ProBNP No results found for: PROBNP  Imaging: No results found.   Assessment & Plan:   Obstructive sleep apnea Doing well  Adjust CPAP pressure for comfort   Plan  Patient Instructions  Decrease CPAP 7 cm .  Continue on CPAP At bedtime  .  Keep up good work.  Work on healthy weight  Do not drive is sleepy .  Follow up Dr. Elsworth Soho  In 1 year and As needed           Rexene Edison, NP 11/03/2017

## 2017-11-05 ENCOUNTER — Encounter: Payer: Self-pay | Admitting: Cardiovascular Disease

## 2017-11-05 ENCOUNTER — Ambulatory Visit: Payer: BC Managed Care – PPO | Admitting: Cardiovascular Disease

## 2017-11-05 VITALS — BP 142/72 | HR 75 | Ht 69.0 in | Wt 217.2 lb

## 2017-11-05 DIAGNOSIS — Z122 Encounter for screening for malignant neoplasm of respiratory organs: Secondary | ICD-10-CM | POA: Diagnosis not present

## 2017-11-05 DIAGNOSIS — F17200 Nicotine dependence, unspecified, uncomplicated: Secondary | ICD-10-CM | POA: Diagnosis not present

## 2017-11-05 DIAGNOSIS — I10 Essential (primary) hypertension: Secondary | ICD-10-CM

## 2017-11-05 NOTE — Patient Instructions (Signed)
Medication Instructions:  Your physician recommends that you continue on your current medications as directed. Please refer to the Current Medication list given to you today.  Labwork: NONE  Testing/Procedures: Non-Cardiac CT scanning for lung cancer screening, (CAT scanning), is a noninvasive, special x-ray that produces cross-sectional images of the body using x-rays and a computer. CT scans help physicians diagnose and treat medical conditions. For some CT exams, a contrast material is used to enhance visibility in the area of the body being studied. CT scans provide greater clarity and reveal more details than regular x-ray exams.  Follow-Up: Your physician wants you to follow-up in: 6 months with Dr. Nishan. You will receive a reminder letter in the mail two months in advance. If you don't receive a letter, please call our office to schedule the follow-up appointment.   If you need a refill on your cardiac medications before your next appointment, please call your pharmacy.    

## 2017-11-10 ENCOUNTER — Encounter: Payer: Self-pay | Admitting: Pulmonary Disease

## 2017-11-11 ENCOUNTER — Telehealth: Payer: Self-pay | Admitting: Pulmonary Disease

## 2017-11-11 DIAGNOSIS — G4733 Obstructive sleep apnea (adult) (pediatric): Secondary | ICD-10-CM

## 2017-11-11 NOTE — Telephone Encounter (Signed)
Called and spoke with pt who stated the last him he saw TP on 11/03/17, he had TP lower his settings to 7.  Pt states he believes his settings are now too low and is wanting them upped back up to possibly 9.    Before pt had TP change his settings, his settings were set to 8 and then it was lowered to 7.  Dr. Elsworth Soho, please advise if you think pt's current settings of 7 are okay or if we need to up pt back to a higher setting.    Download printed and placed in RA's purple folder for him to view tomorrow, 11/12/17.  Will route this message to RA so he can also see this message along with the printed download.

## 2017-11-12 NOTE — Telephone Encounter (Signed)
Called pt after RA reviewed the download to let him know his OSA was controlled on current settings but could be increased if he desired it to be.  Pt stated he would like it to be increased due to not thinking he is getting enough rest.  Will place an order for pt's settings to be changed. Nothing further needed at this current time.

## 2017-11-12 NOTE — Telephone Encounter (Signed)
Review download on CPAP 7 cm and seems to be controlling events well. Suppression of 7 cm should be okay. But can increase to 9 cm for comfort if patient desires

## 2017-11-17 ENCOUNTER — Ambulatory Visit (INDEPENDENT_AMBULATORY_CARE_PROVIDER_SITE_OTHER)
Admission: RE | Admit: 2017-11-17 | Discharge: 2017-11-17 | Disposition: A | Discharge status: Home / Self Care | Payer: BC Managed Care – PPO | Source: Ambulatory Visit | Attending: Cardiovascular Disease | Admitting: Cardiovascular Disease

## 2017-11-17 DIAGNOSIS — F17200 Nicotine dependence, unspecified, uncomplicated: Secondary | ICD-10-CM | POA: Diagnosis not present

## 2017-11-17 DIAGNOSIS — Z122 Encounter for screening for malignant neoplasm of respiratory organs: Secondary | ICD-10-CM | POA: Diagnosis not present

## 2017-11-19 ENCOUNTER — Ambulatory Visit: Payer: BC Managed Care – PPO | Admitting: Family Medicine

## 2017-11-19 ENCOUNTER — Encounter: Payer: Self-pay | Admitting: Family Medicine

## 2017-11-19 VITALS — BP 140/60 | HR 78 | Temp 98.1°F | Resp 20 | Wt 215.0 lb

## 2017-11-19 DIAGNOSIS — R1032 Left lower quadrant pain: Secondary | ICD-10-CM

## 2017-11-19 LAB — URINALYSIS, ROUTINE W REFLEX MICROSCOPIC
BACTERIA UA: NONE SEEN /HPF
Bilirubin Urine: NEGATIVE
Glucose, UA: NEGATIVE
Ketones, ur: NEGATIVE
Leukocytes, UA: NEGATIVE
Nitrite: NEGATIVE
PH: 6 (ref 5.0–8.0)
Protein, ur: NEGATIVE
SPECIFIC GRAVITY, URINE: 1.015 (ref 1.001–1.03)
SQUAMOUS EPITHELIAL / LPF: NONE SEEN /HPF (ref <=5)
WBC, UA: NONE SEEN /HPF (ref 0–5)

## 2017-11-19 LAB — MICROSCOPIC MESSAGE

## 2017-11-19 MED ORDER — HYDROMORPHONE HCL 2 MG PO TABS: 2.0000 mg | ORAL_TABLET | ORAL | 0 refills | Status: DC | PRN

## 2017-11-19 MED ORDER — METRONIDAZOLE 500 MG PO TABS
500.0000 mg | ORAL_TABLET | Freq: Two times a day (BID) | ORAL | 0 refills | Status: DC
Start: 2017-11-19 — End: 2018-04-16

## 2017-11-19 MED ORDER — CIPROFLOXACIN HCL 500 MG PO TABS: 500.0000 mg | ORAL_TABLET | Freq: Two times a day (BID) | ORAL | 0 refills | Status: DC

## 2017-11-19 NOTE — Progress Notes (Signed)
Subjective:    Patient ID: [name], male    DOB: 1956/04/08, 62 y.o.   MRN: [id number]  HPI Patient is a 62 year old white male with a past medical history of coronary artery disease status post CABG who presents with the sudden onset of left lower quadrant abdominal pain around 12:30 this afternoon. Pain is moderate to severe in intensity. Pain is so severe it causes nausea and vomiting at times. He is having normal bowel movements. His last bowel movement was this morning. He denies any blood in his stool or black tarry stool. He denies any fevers or chills. He denies any dysuria or hematuria. He denies any low back pain. He has no CVA tenderness. Pain is located in the left inguinal canal or slightly above that. There is no palpable bulge in that area. There is no testicular pain. Patient does have a past medical history of diverticulosis on colonoscopy. Exam today is significant for diminished bowel sounds bilaterally in all 4 quadrants. However there is no guarding and there is no tenderness to palpation in the left lower quadrant. Differential diagnosis includes diverticulitis versus nephrolithiasis. Patient demonstrates no evidence of an acute abdomen today on exam. Movement and ambulation and palpitation did not elicit pain. Therefore I believe that bowel obstruction is unlikely. Abdomen is also soft nondistended.   Past Medical History:  Diagnosis Date  . CHEST PAIN   . COLONIC POLYPS, HX OF   . CORONARY ARTERY DISEASE    a. s/p stent to RCA 2005;  b. Last LHC 12/07: Mid LAD 40%, proximal circumflex 20%, mid circumflex 95%, mid RCA stent okay, normal LV function. PCI: Taxus DES to the [facility];  c.  Myoview 10/11: EF 56%, no ischemia or scar    . DIVERTICULOSIS, COLON   . DYSPHAGIA UNSPECIFIED    intermittent  . GERD   . HEMORRHOIDS   . HYPERLIPIDEMIA   . HYPERTENSION   . HYPERTRIGLYCERIDEMIA   . OBESITY   . SLEEP APNEA    on CPAP   Past Surgical History:  Procedure Laterality  Date  . ACHILLES TENDON REPAIR    . BACK SURGERY     lower back  . CARDIAC CATHETERIZATION    . CORONARY ARTERY BYPASS GRAFT  08/18/2012   Procedure: CORONARY ARTERY BYPASS GRAFTING (CABG);  Surgeon: Gaye Pollack, MD;  Location: [place];  Service: Open Heart Surgery;  Laterality: N/A;  CABG x four;  using left internal mammary artery and right leg greater saphenous vein harvested endoscopically  . KNEE ARTHROSCOPY     RIGHT  . LUMBAR LAMINECTOMY/DECOMPRESSION MICRODISCECTOMY N/A 12/05/2015   Procedure: LUMBAR 2-3, LUMBAR 3-4 DECOMPRESSION ;  Surgeon: Phylliss Bob, MD;  Location: [place];  Service: Orthopedics;  Laterality: N/A;  . Right long finger extensor tendon repair     Current Outpatient Medications on File Prior to Visit  Medication Sig Dispense Refill  . aspirin EC 81 MG tablet Take 1 tablet (81 mg total) by mouth daily. 90 tablet 3  . losartan-hydrochlorothiazide (HYZAAR) 100-12.5 MG tablet Take 1 tablet by mouth daily. 90 tablet 3  . omeprazole (PRILOSEC) 20 MG capsule Take 20 mg by mouth daily.     No current facility-administered medications on file prior to visit.    Allergies  Allergen Reactions  . Statins Other (See Comments)    Bone and muscle pain  . Hydrocodone Nausea And Vomiting   Social History   Socioeconomic History  . Marital status: Married  Spouse name: Not on file  . Number of children: Not on file  . Years of education: Not on file  . Highest education level: Not on file  Social Needs  . Financial resource strain: Not on file  . Food insecurity - worry: Not on file  . Food insecurity - inability: Not on file  . Transportation needs - medical: Not on file  . Transportation needs - non-medical: Not on file  Occupational History  . Not on file  Tobacco Use  . Smoking status: Current Every Day Smoker    Packs/day: 1.50    Years: 43.00    Pack years: 64.50    Types: Cigarettes    Last attempt to quit: 08/18/2012    Years since quitting: 5.2  .  Smokeless tobacco: Never Used  . Tobacco comment: Patient attempting to quit pt down to 1 ppd  Substance and Sexual Activity  . Alcohol use: Yes    Comment: 1-2 drink occasionally  . Drug use: No  . Sexual activity: Not on file  Other Topics Concern  . Not on file  Social History Narrative  . Not on file      Review of Systems  All other systems reviewed and are negative.      Objective:   Physical Exam  Constitutional: He appears well-developed and well-nourished. He appears distressed.  Cardiovascular: Normal rate, regular rhythm and normal heart sounds.  Pulmonary/Chest: Effort normal and breath sounds normal. No respiratory distress. He has no wheezes. He has no rales.  Abdominal: Soft. Bowel sounds are normal. He exhibits no distension. There is no tenderness. There is no rigidity, no rebound, no guarding, no CVA tenderness, no tenderness at McBurney's point and negative Murphy's sign.    Vitals reviewed.         Assessment & Plan:  Continuous LLQ abdominal pain - Plan: Urinalysis, Routine w reflex microscopic, CBC with Differential/Platelet, COMPLETE METABOLIC PANEL WITH GFR  Location suggest diverticulitis. However patient's appearance and exam suggests nephrolithiasis as he is sitting uncomfortably and cannot get comfortable. Pain is sudden and intense in nature again suggesting possible nephrolithiasis. Therefore I'll obtain a urinalysis to evaluate for hematuria. Urinalysis shows trace hematuria. I discussed the situation with the patient and his wife. My 2 leading suspects are diverticulitis versus nephrolithiasis. I will treat the patient empirically for diverticulitis with Cipro 500 mg by mouth twice a day for 10 days and Flagyl 500 mg by mouth twice a day for 10 days. I will also give him Dilaudid 2 mg every 4 hours as needed for pain. I encouraged the patient to avoid eating solid food for the next 24 hours until the pain improves. Instead I want him pushing  clear liquids such as Gatorade and water to improve hydration and facilitate kidney stone passage in case this is a kidney stone. Reassess in 24 hours. If symptoms worsen, he is to go to the ER.

## 2017-11-22 ENCOUNTER — Encounter (HOSPITAL_COMMUNITY): Payer: Self-pay | Admitting: *Deleted

## 2017-11-22 ENCOUNTER — Emergency Department (HOSPITAL_COMMUNITY)
Admission: EM | Admit: 2017-11-22 | Discharge: 2017-11-23 | Disposition: A | Discharge status: Home / Self Care | Payer: BC Managed Care – PPO | Attending: Emergency Medicine | Admitting: Emergency Medicine

## 2017-11-22 ENCOUNTER — Other Ambulatory Visit: Payer: Self-pay

## 2017-11-22 ENCOUNTER — Emergency Department (HOSPITAL_COMMUNITY): Payer: BC Managed Care – PPO

## 2017-11-22 DIAGNOSIS — Z7982 Long term (current) use of aspirin: Secondary | ICD-10-CM | POA: Diagnosis not present

## 2017-11-22 DIAGNOSIS — N189 Chronic kidney disease, unspecified: Secondary | ICD-10-CM | POA: Diagnosis not present

## 2017-11-22 DIAGNOSIS — I129 Hypertensive chronic kidney disease with stage 1 through stage 4 chronic kidney disease, or unspecified chronic kidney disease: Secondary | ICD-10-CM | POA: Insufficient documentation

## 2017-11-22 DIAGNOSIS — Z79899 Other long term (current) drug therapy: Secondary | ICD-10-CM | POA: Diagnosis not present

## 2017-11-22 DIAGNOSIS — N23 Unspecified renal colic: Secondary | ICD-10-CM | POA: Diagnosis not present

## 2017-11-22 DIAGNOSIS — K5900 Constipation, unspecified: Secondary | ICD-10-CM | POA: Diagnosis not present

## 2017-11-22 DIAGNOSIS — F17210 Nicotine dependence, cigarettes, uncomplicated: Secondary | ICD-10-CM | POA: Diagnosis not present

## 2017-11-22 DIAGNOSIS — R1110 Vomiting, unspecified: Secondary | ICD-10-CM | POA: Diagnosis not present

## 2017-11-22 DIAGNOSIS — Z951 Presence of aortocoronary bypass graft: Secondary | ICD-10-CM | POA: Diagnosis not present

## 2017-11-22 DIAGNOSIS — I2510 Atherosclerotic heart disease of native coronary artery without angina pectoris: Secondary | ICD-10-CM | POA: Diagnosis not present

## 2017-11-22 DIAGNOSIS — R1032 Left lower quadrant pain: Secondary | ICD-10-CM | POA: Diagnosis present

## 2017-11-22 LAB — COMPREHENSIVE METABOLIC PANEL
ALT: 31 U/L (ref 17–63)
AST: 37 U/L (ref 15–41)
Albumin: 3.8 g/dL (ref 3.5–5.0)
Alkaline Phosphatase: 49 U/L (ref 38–126)
Anion gap: 12 (ref 5–15)
BUN: 22 mg/dL — AB (ref 6–20)
CALCIUM: 8.7 mg/dL — AB (ref 8.9–10.3)
CO2: 20 mmol/L — ABNORMAL LOW (ref 22–32)
Chloride: 100 mmol/L — ABNORMAL LOW (ref 101–111)
Creatinine, Ser: 1.87 mg/dL — ABNORMAL HIGH (ref 0.61–1.24)
GFR calc Af Amer: 42 mL/min — ABNORMAL LOW (ref >60)
GFR, EST NON AFRICAN AMERICAN: 37 mL/min — AB (ref >60)
Glucose, Bld: 103 mg/dL — ABNORMAL HIGH (ref 65–99)
Potassium: 3.9 mmol/L (ref 3.5–5.1)
Sodium: 132 mmol/L — ABNORMAL LOW (ref 135–145)
TOTAL PROTEIN: 7 g/dL (ref 6.5–8.1)
Total Bilirubin: 0.7 mg/dL (ref 0.3–1.2)

## 2017-11-22 LAB — LIPASE, BLOOD: Lipase: 45 U/L (ref 11–51)

## 2017-11-22 LAB — CBC
HCT: 41.7 % (ref 39.0–52.0)
Hemoglobin: 14.4 g/dL (ref 13.0–17.0)
MCH: 30.4 pg (ref 26.0–34.0)
MCHC: 34.5 g/dL (ref 30.0–36.0)
MCV: 88.2 fL (ref 78.0–100.0)
PLATELETS: 178 10*3/uL (ref 150–400)
RBC: 4.73 MIL/uL (ref 4.22–5.81)
RDW: 12.4 % (ref 11.5–15.5)
WBC: 8 10*3/uL (ref 4.0–10.5)

## 2017-11-22 LAB — URINALYSIS, ROUTINE W REFLEX MICROSCOPIC
Bilirubin Urine: NEGATIVE
Glucose, UA: NEGATIVE mg/dL
Hgb urine dipstick: NEGATIVE
Ketones, ur: NEGATIVE mg/dL
Leukocytes, UA: NEGATIVE
Nitrite: NEGATIVE
Protein, ur: NEGATIVE mg/dL
Specific Gravity, Urine: 1.009 (ref 1.005–1.030)
pH: 6 (ref 5.0–8.0)

## 2017-11-22 MED ORDER — TAMSULOSIN HCL 0.4 MG PO CAPS: 0.4000 mg | ORAL_CAPSULE | Freq: Every day | ORAL | 0 refills | Status: DC

## 2017-11-22 MED ORDER — MORPHINE SULFATE (PF) 4 MG/ML IV SOLN
4.0000 mg | Freq: Once | INTRAVENOUS | Status: AC
  Administered 2017-11-22: 4 mg via INTRAVENOUS
  Filled 2017-11-22: qty 1

## 2017-11-22 MED ORDER — METOCLOPRAMIDE HCL 5 MG/ML IJ SOLN
10.0000 mg | Freq: Once | INTRAMUSCULAR | Status: AC
  Administered 2017-11-22: 10 mg via INTRAVENOUS
  Filled 2017-11-22: qty 2

## 2017-11-22 MED ORDER — IOPAMIDOL (ISOVUE-300) INJECTION 61%
INTRAVENOUS | Status: AC
  Administered 2017-11-22: 75 mL
  Filled 2017-11-22: qty 75

## 2017-11-22 NOTE — ED Notes (Signed)
Patient transported to CT 

## 2017-11-22 NOTE — Discharge Instructions (Signed)
It is okay to stop the antibiotic prescribed.  Take the medication prescribed as well as the hydromorphone prescribed by your primary care physician for pain.  Call [facility] tomorrow to schedule the next available appointment.  Tell office staff that you were seen here for a kidney stone.  Return if pain is not well controlled with hydromorphone or if you develop vomiting or are concern for any reason your blood pressure is mildly elevated

## 2017-11-22 NOTE — ED Notes (Signed)
Kyle Leeds, MD made aware of pt/family requesting to speak to them. This RN apologized to pt/family for delay.

## 2017-11-22 NOTE — ED Notes (Signed)
Pt changing into gown

## 2017-11-22 NOTE — ED Triage Notes (Signed)
Pt reports having LLQ pain on Thursday that radiated around to his back. Had n/v when pain occurred. Pt went to pcp and started on antibiotics and pain meds to tx diverticulitis and/or possible kidney stones. Pt felt ok on Friday but now has return of pain and constipation, last bm was Thursday. Denies urinary symptoms or vomiting.

## 2017-11-22 NOTE — ED Provider Notes (Signed)
[place] EMERGENCY DEPARTMENT Provider Note   CSN: [id number] Arrival date & time: 11/22/17  1602     History   Chief Complaint Chief Complaint  Patient presents with  . Abdominal Pain    HPI MALEKAI MARKWOOD is a 62 y.o. male.  Of left lower quadrant pain rating to left flank onset 3 days ago.  Pain is not made better or worse by anything he had vomiting 3 days ago but none since.  He does feel constipated.  His last bowel movement was 2 days ago.  He denies any fever.  No other associated symptoms.  He was seen by his primary care physician who prescribed Cipro, hydromorphone, which she has taken without relief.  HPI  Past Medical History:  Diagnosis Date  . CHEST PAIN   . COLONIC POLYPS, HX OF   . CORONARY ARTERY DISEASE    a. s/p stent to RCA 2005;  b. Last LHC 12/07: Mid LAD 40%, proximal circumflex 20%, mid circumflex 95%, mid RCA stent okay, normal LV function. PCI: Taxus DES to the [facility];  c.  Myoview 10/11: EF 56%, no ischemia or scar    . DIVERTICULOSIS, COLON   . DYSPHAGIA UNSPECIFIED    intermittent  . GERD   . HEMORRHOIDS   . HYPERLIPIDEMIA   . HYPERTENSION   . HYPERTRIGLYCERIDEMIA   . OBESITY   . SLEEP APNEA    on CPAP    Patient Active Problem List   Diagnosis Date Noted  . 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 PAF (paroxysmal atrial fibrillation) ([place]) 09/10/2012  . Tobacco abuse 07/01/2012  . HYPERTRIGLYCERIDEMIA 06/19/2009  . Elevated lipids 06/19/2009  . OBESITY 06/19/2009  . Essential hypertension 06/19/2009  . Chronic bronchitis ([place]) 06/19/2009  . CHEST PAIN 06/19/2009  . DYSPHAGIA UNSPECIFIED 06/19/2009  . Coronary atherosclerosis 03/01/2008  . HEMORRHOIDS 03/01/2008  . GERD 03/01/2008  . DIVERTICULOSIS, COLON 03/01/2008  . Obstructive sleep apnea 03/01/2008  . COLONIC POLYPS, HX OF 03/01/2008    Past Surgical History:  Procedure Laterality Date  . ACHILLES TENDON REPAIR    . BACK SURGERY     lower back  .  CARDIAC CATHETERIZATION    . CORONARY ARTERY BYPASS GRAFT  08/18/2012   Procedure: CORONARY ARTERY BYPASS GRAFTING (CABG);  Surgeon: Gaye Pollack, MD;  Location: [place];  Service: Open Heart Surgery;  Laterality: N/A;  CABG x four;  using left internal mammary artery and right leg greater saphenous vein harvested endoscopically  . KNEE ARTHROSCOPY     RIGHT  . LUMBAR LAMINECTOMY/DECOMPRESSION MICRODISCECTOMY N/A 12/05/2015   Procedure: LUMBAR 2-3, LUMBAR 3-4 DECOMPRESSION ;  Surgeon: Phylliss Bob, MD;  Location: [place];  Service: Orthopedics;  Laterality: N/A;  . Right long finger extensor tendon repair         Home Medications    Prior to Admission medications   Medication Sig Start Date End Date Taking? Authorizing Provider  aspirin EC 81 MG tablet Take 1 tablet (81 mg total) by mouth daily. 03/26/17   Isaiah Serge, NP  ciprofloxacin (CIPRO) 500 MG tablet Take 1 tablet (500 mg total) by mouth 2 (two) times daily. 11/19/17   Susy Frizzle, MD  HYDROmorphone (DILAUDID) 2 MG tablet Take 1 tablet (2 mg total) by mouth every 4 (four) hours as needed for severe pain. 11/19/17   Susy Frizzle, MD  losartan-hydrochlorothiazide (HYZAAR) 100-12.5 MG tablet Take 1 tablet by mouth daily. 03/26/17   Isaiah Serge, NP  metroNIDAZOLE (FLAGYL) 500 MG tablet Take 1 tablet (500 mg total) by mouth 2 (two) times daily. 11/19/17   Susy Frizzle, MD  omeprazole (PRILOSEC) 20 MG capsule Take 20 mg by mouth daily.    [provider]    Family History Family History  Problem Relation Age of Onset  . Cancer Mother   . Heart disease Brother     Social History Social History   Tobacco Use  . Smoking status: Current Every Day Smoker    Packs/day: 1.50    Years: 43.00    Pack years: 64.50    Types: Cigarettes    Last attempt to quit: 08/18/2012    Years since quitting: 5.2  . Smokeless tobacco: Never Used  . Tobacco comment: Patient attempting to quit pt down to 1 ppd  Substance  Use Topics  . Alcohol use: Yes    Comment: 1-2 drink occasionally  . Drug use: No     Allergies   Statins and Hydrocodone   Review of Systems Review of Systems  Constitutional: Negative.   HENT: Negative.   Respiratory: Negative.   Cardiovascular: Negative.   Gastrointestinal: Positive for abdominal pain, constipation and vomiting.  Musculoskeletal: Negative.   Skin: Negative.   Neurological: Negative.   Psychiatric/Behavioral: Negative.   All other systems reviewed and are negative.    Physical Exam Updated Vital Signs BP (!) 150/96   Pulse 76   Temp 98.1 F (36.7 C) (Oral)   Resp 16   SpO2 96%   Physical Exam  Constitutional: He appears well-developed and well-nourished.  HENT:  Head: Normocephalic and atraumatic.  Eyes: Conjunctivae are normal. Pupils are equal, round, and reactive to light.  Neck: Neck supple. No tracheal deviation present. No thyromegaly present.  Cardiovascular: Normal rate and regular rhythm.  No murmur heard. Pulmonary/Chest: Effort normal and breath sounds normal.  Abdominal: Soft. Bowel sounds are normal. He exhibits no distension. There is no tenderness.  Genitourinary: Penis normal.  Genitourinary Comments: No flank tenderness  Musculoskeletal: Normal range of motion. He exhibits no edema or tenderness.  Neurological: He is alert. Coordination normal.  Skin: Skin is warm and dry. No rash noted.  Psychiatric: He has a normal mood and affect.  Nursing note and vitals reviewed.    ED Treatments / Results  Labs (all labs ordered are listed, but only abnormal results are displayed) Labs Reviewed  COMPREHENSIVE METABOLIC PANEL - Abnormal; Notable for the following components:      Result Value   Sodium 132 (*)    Chloride 100 (*)    CO2 20 (*)    Glucose, Bld 103 (*)    BUN 22 (*)    Creatinine, Ser 1.87 (*)    Calcium 8.7 (*)    GFR calc non Af Amer 37 (*)    GFR calc Af Amer 42 (*)    All other components within normal  limits  LIPASE, BLOOD  CBC  URINALYSIS, ROUTINE W REFLEX MICROSCOPIC    EKG  EKG Interpretation None       Radiology No results found.  Procedures Procedures (including critical care time)  Medications Ordered in ED Medications  iopamidol (ISOVUE-300) 61 % injection (not administered)  morphine 4 MG/ML injection 4 mg (4 mg Intravenous Given 11/22/17 2226)  metoCLOPramide (REGLAN) injection 10 mg (10 mg Intravenous Given 11/22/17 2226)    Results for orders placed or performed during the hospital encounter of 11/22/17  Lipase, blood  Result Value Ref Range  Lipase 45 11 - 51 U/L  Comprehensive metabolic panel  Result Value Ref Range   Sodium 132 (L) 135 - 145 mmol/L   Potassium 3.9 3.5 - 5.1 mmol/L   Chloride 100 (L) 101 - 111 mmol/L   CO2 20 (L) 22 - 32 mmol/L   Glucose, Bld 103 (H) 65 - 99 mg/dL   BUN 22 (H) 6 - 20 mg/dL   Creatinine, Ser 1.87 (H) 0.61 - 1.24 mg/dL   Calcium 8.7 (L) 8.9 - 10.3 mg/dL   Total Protein 7.0 6.5 - 8.1 g/dL   Albumin 3.8 3.5 - 5.0 g/dL   AST 37 15 - 41 U/L   ALT 31 17 - 63 U/L   Alkaline Phosphatase 49 38 - 126 U/L   Total Bilirubin 0.7 0.3 - 1.2 mg/dL   GFR calc non Af Amer 37 (L) >60 mL/min   GFR calc Af Amer 42 (L) >60 mL/min   Anion gap 12 5 - 15  CBC  Result Value Ref Range   WBC 8.0 4.0 - 10.5 K/uL   RBC 4.73 4.22 - 5.81 MIL/uL   Hemoglobin 14.4 13.0 - 17.0 g/dL   HCT 41.7 39.0 - 52.0 %   MCV 88.2 78.0 - 100.0 fL   MCH 30.4 26.0 - 34.0 pg   MCHC 34.5 30.0 - 36.0 g/dL   RDW 12.4 11.5 - 15.5 %   Platelets 178 150 - 400 K/uL  Urinalysis, Routine w reflex microscopic  Result Value Ref Range   Color, Urine YELLOW YELLOW   APPearance CLEAR CLEAR   Specific Gravity, Urine 1.009 1.005 - 1.030   pH 6.0 5.0 - 8.0   Glucose, UA NEGATIVE NEGATIVE mg/dL   Hgb urine dipstick NEGATIVE NEGATIVE   Bilirubin Urine NEGATIVE NEGATIVE   Ketones, ur NEGATIVE NEGATIVE mg/dL   Protein, ur NEGATIVE NEGATIVE mg/dL   Nitrite NEGATIVE  NEGATIVE   Leukocytes, UA NEGATIVE NEGATIVE   Ct Abdomen Pelvis W Contrast  Result Date: 11/22/2017 CLINICAL DATA:  Left lower quadrant pain on Thursday radiating to the back. Nausea and vomiting. Patient was prescribed antibiotics but pain has returned. Reduce contrast does due to decreased GFR. EXAM: CT ABDOMEN AND PELVIS WITH CONTRAST TECHNIQUE: Multidetector CT imaging of the abdomen and pelvis was performed using the standard protocol following bolus administration of intravenous contrast. CONTRAST:  61mL ISOVUE-300 IOPAMIDOL (ISOVUE-300) INJECTION 61% COMPARISON:  None. FINDINGS: Lower chest: Atelectasis in the lung bases. Postoperative changes in the mediastinum. Hepatobiliary: No focal liver abnormality is seen. No gallstones, gallbladder wall thickening, or biliary dilatation. Pancreas: Unremarkable. No pancreatic ductal dilatation or surrounding inflammatory changes. Spleen: Normal in size without focal abnormality. Adrenals/Urinary Tract: No adrenal gland nodules. 5 mm stone in the proximal left ureter at the ureteropelvic junction. Proximal hydronephrosis. Stranding around the left kidney and left ureter. Additional 5 mm stone demonstrated in the midportion of the left kidney. 3.5 mm stone in the posterior bladder, likely representing a recently passed stone. No hydronephrosis or hydroureter on the right. Small renal cysts. No bladder wall thickening. Stomach/Bowel: Diverticulosis of the sigmoid colon without evidence of diverticulitis. Stomach, small bowel, and colon are not abnormally distended. Stool throughout the colon. Appendix is normal. Vascular/Lymphatic: Aortic atherosclerosis. No enlarged abdominal or pelvic lymph nodes. Reproductive: Prostate is unremarkable. Other: No abdominal wall hernia or abnormality. No abdominopelvic ascites. Musculoskeletal: Degenerative changes in the spine. No destructive bone lesions. IMPRESSION: 1. 5 mm stone in the proximal left ureter at the ureteropelvic  junction with  moderate proximal obstruction. 2. 3.5 mm stone in the posterior bladder, likely representing a recently passed stone. 3. Nonobstructing 5 mm stone in the midpole left kidney. 4. Aortic atherosclerosis. 5. Diverticulosis of sigmoid colon without evidence of diverticulitis. Electronically Signed   By: Lucienne Capers M.D.   On: 11/22/2017 23:17   Ct Chest Lung Ca Screen Low Dose W/o Cm  Result Date: 11/17/2017 CLINICAL DATA:  62 year old male with 65 pack-year history of smoking. Lung cancer screening EXAM: CT CHEST WITHOUT CONTRAST LOW-DOSE FOR LUNG CANCER SCREENING TECHNIQUE: Multidetector CT imaging of the chest was performed following the standard protocol without IV contrast. COMPARISON:  None. FINDINGS: Cardiovascular: The heart size is normal. No pericardial effusion. Coronary artery calcification is evident. Status post CABG. Atherosclerotic calcification is noted in the wall of the thoracic aorta. Mediastinum/Nodes: No mediastinal lymphadenopathy. There is no hilar lymphadenopathy. The esophagus has normal imaging features. There is no axillary lymphadenopathy. Lungs/Pleura: Tiny right lung nodules measure up to a maximum volume derived mean diameter of 4.2 mm. No suspicious pulmonary nodule or mass. No focal airspace consolidation. Mild centrilobular emphysema evident. Upper Abdomen: 2 cm exophytic lesion upper pole right kidney measures water attenuation and is compatible with a cyst. Otherwise unremarkable. Musculoskeletal: Bone windows reveal no worrisome lytic or sclerotic osseous lesions. IMPRESSION: 1. Lung-RADS 2, benign appearance or behavior. Continue annual screening with low-dose chest CT without contrast in 12 months. 2.  Emphysema. (ICD10-J43.9) 3.  Aortic Atherosclerois (ICD10-170.0) Electronically Signed   By: Misty Stanley M.D.   On: 11/17/2017 11:37   Initial Impression / Assessment and Plan / ED Course  I have reviewed the triage vital signs and the nursing  notes.  Pertinent labs & imaging results that were available during my care of the patient were reviewed by me and considered in my medical decision making (see chart for details).     11:35 PM pain is nausea controlled after treatment with intravenous opioids and antiemetics plan prescription for Flomax.  Referral [facility] blood pressure recheck 3 weeks  Final Clinical Impressions(s) / ED Diagnoses  Diagnoses #1 ureteral colic #2 renal insufficiency #3 elevated blood pressure Final diagnoses:  None    ED Discharge Orders    None       Orlie Dakin, MD 11/22/17 2341

## 2018-01-27 ENCOUNTER — Ambulatory Visit
Admission: RE | Admit: 2018-01-27 | Discharge: 2018-01-27 | Disposition: A | Discharge status: Home / Self Care | Payer: Worker's Compensation | Source: Ambulatory Visit | Attending: Family Medicine | Admitting: Family Medicine

## 2018-01-27 ENCOUNTER — Other Ambulatory Visit: Payer: Self-pay | Admitting: Family Medicine

## 2018-01-27 DIAGNOSIS — T1490XA Injury, unspecified, initial encounter: Secondary | ICD-10-CM

## 2018-03-31 ENCOUNTER — Other Ambulatory Visit: Payer: Self-pay | Admitting: Family Medicine

## 2018-04-15 ENCOUNTER — Telehealth: Payer: Self-pay | Admitting: Cardiovascular Disease

## 2018-04-15 MED ORDER — NITROGLYCERIN 0.4 MG SL SUBL: 0.4000 mg | SUBLINGUAL_TABLET | SUBLINGUAL | 3 refills | Status: DC | PRN

## 2018-04-15 NOTE — Addendum Note (Signed)
Addended by: Dollene Primrose on: 04/15/2018 04:54 PM   Modules accepted: Orders

## 2018-04-15 NOTE — Telephone Encounter (Signed)
Pt calling  Pt need nurse to give him a call so a message can be given to Dr Johnsie Cancel. Pt didn't want to give me the message.

## 2018-04-15 NOTE — Telephone Encounter (Signed)
Order for [employer] sent.

## 2018-04-15 NOTE — Telephone Encounter (Signed)
Follow up   Use [facility] [address] phone#[phone], [place] to send [employer] and Imdur.

## 2018-04-15 NOTE — Telephone Encounter (Signed)
Spoke with patient He does not want to go to ER Call in new SL nitro and imdur 30 mg daily Have him see PA tomorrow or Monday to get H &P lab work and arrange cath for next week

## 2018-04-15 NOTE — Telephone Encounter (Signed)
Called patient back. Patient complaining of chest pain that comes and goes when he is active. Patient stated that the pain is around 7 and 8 on the pain scale. Patient stated he had chest pain this morning and he chewed up an aspirin and the pain went away. Patient stated he does not have any NTG on him to take and what he has is probably old. Patient stated he feels fine now. With patient's history of CABG, encouraged him to go to the ED next time he has chest pain. Patient stated he would like to talk to Dr. Johnsie Cancel if he could. Informed patient that a message would be sent to Dr. Johnsie Cancel. Patient thanked me for my time.

## 2018-04-15 NOTE — Telephone Encounter (Signed)
Pt is aware he has been scheduled to see Pecolia Ades, NP tomorrow at 10 AM.

## 2018-04-16 ENCOUNTER — Encounter (INDEPENDENT_AMBULATORY_CARE_PROVIDER_SITE_OTHER): Payer: Self-pay

## 2018-04-16 ENCOUNTER — Other Ambulatory Visit: Payer: Self-pay | Admitting: Cardiovascular Disease

## 2018-04-16 ENCOUNTER — Other Ambulatory Visit: Payer: Self-pay

## 2018-04-16 ENCOUNTER — Ambulatory Visit: Payer: BC Managed Care – PPO | Admitting: Cardiology

## 2018-04-16 ENCOUNTER — Encounter: Payer: Self-pay | Admitting: Cardiology

## 2018-04-16 ENCOUNTER — Other Ambulatory Visit: Payer: Self-pay | Admitting: Cardiology

## 2018-04-16 VITALS — BP 136/80 | HR 74 | Ht 69.0 in | Wt 215.0 lb

## 2018-04-16 DIAGNOSIS — N183 Chronic kidney disease, stage 3 unspecified: Secondary | ICD-10-CM | POA: Insufficient documentation

## 2018-04-16 DIAGNOSIS — Z789 Other specified health status: Secondary | ICD-10-CM

## 2018-04-16 DIAGNOSIS — I10 Essential (primary) hypertension: Secondary | ICD-10-CM

## 2018-04-16 DIAGNOSIS — I200 Unstable angina: Secondary | ICD-10-CM | POA: Diagnosis not present

## 2018-04-16 DIAGNOSIS — E785 Hyperlipidemia, unspecified: Secondary | ICD-10-CM | POA: Diagnosis not present

## 2018-04-16 DIAGNOSIS — I25110 Atherosclerotic heart disease of native coronary artery with unstable angina pectoris: Secondary | ICD-10-CM | POA: Diagnosis not present

## 2018-04-16 LAB — BASIC METABOLIC PANEL
BUN/Creatinine Ratio: 13 (ref 10–24)
BUN: 21 mg/dL (ref 8–27)
CALCIUM: 9.4 mg/dL (ref 8.6–10.2)
CO2: 21 mmol/L (ref 20–29)
CREATININE: 1.57 mg/dL — AB (ref 0.76–1.27)
Chloride: 103 mmol/L (ref 96–106)
GFR calc Af Amer: 53 mL/min/{1.73_m2} — ABNORMAL LOW (ref >59)
GFR, EST NON AFRICAN AMERICAN: 46 mL/min/{1.73_m2} — AB (ref >59)
Glucose: 97 mg/dL (ref 65–99)
Potassium: 4.5 mmol/L (ref 3.5–5.2)
Sodium: 137 mmol/L (ref 134–144)

## 2018-04-16 LAB — CBC
HEMATOCRIT: 42.4 % (ref 37.5–51.0)
HEMOGLOBIN: 14.8 g/dL (ref 13.0–17.7)
MCH: 31 pg (ref 26.6–33.0)
MCHC: 34.9 g/dL (ref 31.5–35.7)
MCV: 89 fL (ref 79–97)
Platelets: 198 10*3/uL (ref 150–450)
RBC: 4.77 x10E6/uL (ref 4.14–5.80)
RDW: 12.9 % (ref 12.3–15.4)
WBC: 7.9 10*3/uL (ref 3.4–10.8)

## 2018-04-16 MED ORDER — CLOPIDOGREL BISULFATE 75 MG PO TABS: 75.0000 mg | ORAL_TABLET | Freq: Every day | ORAL | 3 refills | Status: DC

## 2018-04-16 MED ORDER — NITROGLYCERIN 0.4 MG SL SUBL: 0.4000 mg | SUBLINGUAL_TABLET | SUBLINGUAL | 1 refills | Status: DC | PRN

## 2018-04-16 NOTE — Telephone Encounter (Signed)
New Message    *STAT* If patient is at the pharmacy, call can be transferred to refill team.  Pharmacy states that the power was out and will need the prescription sent again  1. Which medications need to be refilled? (please list name of each medication and dose if known) nitroGLYCERIN (NITROSTAT) 0.4 MG SL tablet  2. Which pharmacy/location (including street and city if local pharmacy) is medication to be sent to? [place], [place]  3. Do they need a 30 day or 90 day supply? [place]

## 2018-04-16 NOTE — Patient Instructions (Addendum)
Medication Instructions: Your physician has recommended you make the following change in your medication:  START: Isosorbide (IMDUR) 30 MG DAILY  Your physician wants you to take 300 mg of plavix today and then starting tomorrow take 75 mg of the plavix daily   Labwork: TODAY: BMET & CBC  Procedures/Testing: Your physician has requested that you have a cardiac catheterization. Cardiac catheterization is used to diagnose and/or treat various heart conditions. Doctors may recommend this procedure for a number of different reasons. The most common reason is to evaluate chest pain. Chest pain can be a symptom of coronary artery disease (CAD), and cardiac catheterization can show whether plaque is narrowing or blocking your heart's arteries. This procedure is also used to evaluate the valves, as well as measure the blood flow and oxygen levels in different parts of your heart. For further information please visit HugeFiesta.[email or web address]. Please follow instruction sheet, as given.    Follow-Up: Keep follow up appointment with Dr.Nishan on 06/04/18 at 8:45 AM  Your physician has put in a referral for you to see the lipid clinic   Any Additional Special Instructions Will Be Listed Below (If Applicable).   [place] OFFICE [address], [place] [address] [place] [address] Dept: [phone] Loc: [phone]  [name]  04/16/2018  You are scheduled for a Cardiac Catheterization on Monday, June 24 with Dr. Quay Burow.  1. Please arrive at the [facility] (Main Entrance A) at [facility]: [place], [place] [address] at 9:00 AM (two hours before your procedure to ensure your preparation). Free valet parking service is available.   Special note: Every effort is made to have your procedure done on time. Please understand that emergencies sometimes delay scheduled procedures.  2. Diet: Do not  eat or drink anything after midnight prior to your procedure except sips of water to take medications.  3. Labs: You will need to have blood drawn on Friday, June 21 at [facility] at [facility]. [address]  Open: 7:30am - 5pm    Phone: [phone]. You do not need to be fasting.  4. Medication instructions in preparation for your procedure:  HOLD YOUR LOSARTAN HCTZ THE DAY BEFORE PROCEDURE AND THE MORNING OF    On the morning of your procedure, take your Aspirin and any morning medicines NOT listed above.  You may use sips of water.  5. Plan for one night stay--bring personal belongings. 6. Bring a current list of your medications and current insurance cards. 7. You MUST have a responsible person to drive you home. 8. Someone MUST be with you the first 24 hours after you arrive home or your discharge will be delayed. 9. Please wear clothes that are easy to get on and off and wear slip-on shoes.  Thank you for allowing [place] to care for you!   -- [facility]     If you need a refill on your cardiac medications before your next appointment, please call your pharmacy.

## 2018-04-16 NOTE — Progress Notes (Signed)
Cardiology Office Note:    Date:  04/16/2018   ID:  [name], DOB 30-Dec-1955, MRN [id number]  PCP:  Susy Frizzle, MD  Cardiologist:  Jenkins Rouge, MD  Referring MD: Susy Frizzle, MD   Chief Complaint  Patient presents with  . Chest Pain    History of Present Illness:    [name] is a 62 y.o. male with a past medical history significant for CAD with hx of CAD and CABG  X 4 in 2014 with Dr. Cyndia Bent he had brief PAF post op and was on amiodarone but is now off, maintaining SR. He also has OSA on CPAP, hypertension, hyperlipidemia, GERD, CKD,and is a smoker. He does not take statins as they all cause "pain".  Last myovue 09/02/17 with old inferolateral infarct no ischemia low risk EF 59%.   He was last ween in the office on 11/05/2017 by Dr. Johnsie Cancel and was doing well with no angina. Yesterday he called our office with complaints of intermittent chest pain with activity. He chewed up an aspirin but had no SL NTG to take. He was encouraged to go to the ED but declined. Case was reviewed with Dr. Johnsie Cancel who arranged for me to see the patient today to arrange for cardiac cath next week. A prescription for NTG was provided.   [name] is here with his wife. He states that at baseline he has occ chest tightness with stronger exertion, however last week he began to have more intense mid chest "pain" with less exertional adnm ore often, several times per day. It does not radiate. This morning he had chest pain 8/10, when he went out to sit on the porch. No shortness of breath, lightheadedness, palpitations, nausea, diaphoresis. He chewed an aspirin and ate breakfast and took a shower it resolved after about an hour. He did not have the SL NTG yet. No current chest pain.   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 He usually with no exertional chest pain or shortness of breath until this last week.      Past Medical History:  Diagnosis Date  . CHEST PAIN   . COLONIC POLYPS, HX OF   . CORONARY ARTERY DISEASE    a. s/p stent to RCA 2005;  b. Last LHC 12/07: Mid LAD 40%, proximal circumflex 20%, mid circumflex 95%, mid RCA stent okay, normal LV function. PCI: Taxus DES to the [facility];  c.  Myoview 10/11: EF 56%, no ischemia or scar    . DIVERTICULOSIS, COLON   . DYSPHAGIA UNSPECIFIED    intermittent  . GERD   . HEMORRHOIDS   . HYPERLIPIDEMIA   . HYPERTENSION   . HYPERTRIGLYCERIDEMIA   . OBESITY   . SLEEP APNEA    on CPAP    Past Surgical History:  Procedure Laterality Date  . ACHILLES TENDON REPAIR    . BACK SURGERY     lower back  . CARDIAC CATHETERIZATION    . CORONARY ARTERY BYPASS GRAFT  08/18/2012   Procedure: CORONARY ARTERY BYPASS GRAFTING (CABG);  Surgeon: Gaye Pollack, MD;  Location: [place];  Service: Open Heart Surgery;  Laterality: N/A;  CABG x four;  using left internal mammary artery and right leg greater saphenous vein harvested endoscopically  . KNEE ARTHROSCOPY     RIGHT  . LUMBAR LAMINECTOMY/DECOMPRESSION MICRODISCECTOMY N/A 12/05/2015   Procedure: LUMBAR 2-3, LUMBAR 3-4  DECOMPRESSION ;  Surgeon: Phylliss Bob, MD;  Location: [place];  Service: Orthopedics;  Laterality: N/A;  . Right long finger extensor tendon repair      Current Medications: Current Meds  Medication Sig  . aspirin EC 81 MG tablet Take 1 tablet (81 mg total) by mouth daily.  [name] losartan-hydrochlorothiazide (HYZAAR) 100-12.5 MG tablet TAKE 1 TABLET BY MOUTH ONCE DAILY  . nitroGLYCERIN (NITROSTAT) 0.4 MG SL tablet Place 1 tablet (0.4 mg total) under the tongue every 5 (five) minutes as needed for chest pain.  . [DISCONTINUED] ciprofloxacin (CIPRO) 500 MG tablet Take 1 tablet (500 mg total) by mouth 2 (two) times daily.  . [DISCONTINUED] HYDROmorphone (DILAUDID) 2 MG tablet Take 1 tablet (2 mg total) by mouth every 4 (four) hours as needed for severe pain.  . [DISCONTINUED] metroNIDAZOLE  (FLAGYL) 500 MG tablet Take 1 tablet (500 mg total) by mouth 2 (two) times daily.  . [DISCONTINUED] tamsulosin (FLOMAX) 0.4 MG CAPS capsule Take 1 capsule (0.4 mg total) by mouth daily.     Allergies:   Statins and Hydrocodone   Social History   Socioeconomic History  . Marital status: Married    Spouse name: Not on file  . Number of children: Not on file  . Years of education: Not on file  . Highest education level: Not on file  Occupational History  . Not on file  Social Needs  . Financial resource strain: Not on file  . Food insecurity:    Worry: Not on file    Inability: Not on file  . Transportation needs:    Medical: Not on file    Non-medical: Not on file  Tobacco Use  . Smoking status: Current Every Day Smoker    Packs/day: 1.50    Years: 43.00    Pack years: 64.50    Types: Cigarettes    Last attempt to quit: 08/18/2012    Years since quitting: 5.6  . Smokeless tobacco: Never Used  . Tobacco comment: Patient attempting to quit pt down to 1 ppd  Substance and Sexual Activity  . Alcohol use: Yes    Comment: 1-2 drink occasionally  . Drug use: No  . Sexual activity: Not on file  Lifestyle  . Physical activity:    Days per week: Not on file    Minutes per session: Not on file  . Stress: Not on file  Relationships  . Social connections:    Talks on phone: Not on file    Gets together: Not on file    Attends religious service: Not on file    Active member of club or organization: Not on file    Attends meetings of clubs or organizations: Not on file    Relationship status: Not on file  Other Topics Concern  . Not on file  Social History Narrative  . Not on file     Family History: The patient's family history includes Cancer in his mother; Heart disease in his brother. ROS:   Please see the history of present illness.     All other systems reviewed and are negative.  EKGs/Labs/Other Studies Reviewed:    The following studies were reviewed  today:  Exercise myocardial perfusion study 09/02/2017  The left ventricular ejection fraction is normal (55-65%).  Nuclear stress EF: 59%.  There was no ST segment deviation noted during stress.  No T wave inversion was noted during stress.  Defect 1: There is a medium defect of severe severity present  in the basal inferolateral, mid inferolateral and apical lateral location.  This is a low risk study.     EKG:  EKG is  ordered today.  The ekg ordered today demonstrates NSR, 74 bpm with mild non-specific ST changes  Recent Labs: 11/22/2017: ALT 31; BUN 22; Creatinine, Ser 1.87; Hemoglobin 14.4; Platelets 178; Potassium 3.9; Sodium 132   Recent Lipid Panel    Component Value Date/Time   CHOL 188 11/06/2015 0826   TRIG 201 (H) 11/06/2015 0826   HDL 34 (L) 11/06/2015 0826   CHOLHDL 5.5 (H) 11/06/2015 0826   VLDL 40 (H) 11/06/2015 0826   LDLCALC 114 11/06/2015 0826   LDLDIRECT 77.2 07/10/2009 0852    Physical Exam:    VS:  BP 136/80   Pulse 74   Ht 5\' 9"  (1.753 m)   Wt 215 lb (97.5 kg)   SpO2 95%   BMI 31.75 kg/m     Wt Readings from Last 3 Encounters:  04/16/18 215 lb (97.5 kg)  11/19/17 215 lb (97.5 kg)  11/05/17 217 lb 3.2 oz (98.5 kg)     Physical Exam  Constitutional: He is oriented to person, place, and time. He appears well-developed and well-nourished. No distress.  HENT:  Head: Normocephalic and atraumatic.  Neck: Normal range of motion. Neck supple. No JVD present.  Cardiovascular: Normal rate, regular rhythm, normal heart sounds and intact distal pulses. Exam reveals no gallop and no friction rub.  No murmur heard. Pulmonary/Chest: Effort normal and breath sounds normal. No respiratory distress. He has no wheezes. He has no rales.  Abdominal: Soft. Bowel sounds are normal. He exhibits no distension.  Musculoskeletal: Normal range of motion. He exhibits no edema or deformity.  Neurological: He is alert and oriented to person, place, and time.  Skin:  Skin is warm and dry.  Psychiatric: He has a normal mood and affect. His behavior is normal. Thought content normal.     ASSESSMENT:    1. Atherosclerosis of native coronary artery of native heart with unstable angina pectoris ([place])   2. Unstable angina ([place])   3. Essential hypertension   4. CKD (chronic kidney disease) stage 3, GFR 30-59 ml/min (HCC)   5. Hyperlipidemia, unspecified hyperlipidemia type   6. Statin intolerance    PLAN:    In order of problems listed above:  Unstable angina: Pt with progressively more frequent and more severe chest pain with activity over the last week. No associated symptoms. EKG with mild scooping of the ST segments. No current chest pain. Dr. Johnsie Cancel here and assessed pt. The patient dose not wish to go to the hospital today. He verymuch wants to wait until cath can be doen on Monday as an outpatient. He feels taht he has been stable and he had his wife understand when to call 911.  -Will arrange for left heart cath with graft evaluation on Monday. -SCR in 10/2017 was 1.87 (at time of kideny stone) prior SCr has been 1.3-1.58. Will check BMet, and give IV hydration. -Pt now has SL NTG, instructions for use reveiwed. Will start Imdur 30 mg daily.  -Will load with Plavix 300 mg followed by 75 mg daily. -Alarm symptoms reviewed with instructions to call EMS for CP unrelieved by rest and NTG or worsening symptoms.   The patient understands that risks included but are not limited to stroke (1 in 1000), death (1 in 46), kidney failure [usually temporary] (1 in 500), bleeding (1 in 200), allergic reaction [possibly serious] (1  in 200). He wishes to proceed.   CAD s/p CABG 2014: Managed with aspirin 81 mg, no statin or BB.   Hypertension: Losartan-HCTZ 100-12.5mg  daily. BP well controlled. Will hold these meds day before and day of cath for renal function.   Hyperlipidemia: Not on statin due to pt reports of "pain" on multiple statins. Last LDL in 10/2015  was 114. Consider adding Zetia, refer to lipid clinic for possible PCSK9-I.   Tobacco use: Still smoking 1 PPD. Smoking cessation strongly advised.   CKD: SCR in 10/2017 was 1.87 (at time of kideny stone) prior SCr has been 1.3-1.58. Will check BMet, and give IV hydration prior to cath.    Medication Adjustments/Labs and Tests Ordered: Current medicines are reviewed at length with the patient today.  Concerns regarding medicines are outlined above. Labs and tests ordered and medication changes are outlined in the patient instructions below:  Patient Instructions  Medication Instructions: Your physician has recommended you make the following change in your medication:  START: Isosorbide (IMDUR) 30 MG DAILY  Your physician wants you to take 300 mg of plavix today and then starting tomorrow take 75 mg of the plavix daily   Labwork: TODAY: BMET & CBC  Procedures/Testing: Your physician has requested that you have a cardiac catheterization. Cardiac catheterization is used to diagnose and/or treat various heart conditions. Doctors may recommend this procedure for a number of different reasons. The most common reason is to evaluate chest pain. Chest pain can be a symptom of coronary artery disease (CAD), and cardiac catheterization can show whether plaque is narrowing or blocking your heart's arteries. This procedure is also used to evaluate the valves, as well as measure the blood flow and oxygen levels in different parts of your heart. For further information please visit [email or web address]. Please follow instruction sheet, as given.    Follow-Up: Keep follow up appointment with Dr.Nishan on 06/04/18 at 8:45 AM  Your physician has put in a referral for you to see the lipid clinic   Any Additional Special Instructions Will Be Listed Below (If Applicable).   [place] OFFICE [address] [place] [address] [place] [address] Dept: [phone] Loc: [phone]  MONTGOMERY FAVOR  04/16/2018  You are scheduled for a Cardiac Catheterization on Monday, June 24 with Dr. Quay Burow.  1. Please arrive at the [facility] (Main Entrance A) at [facility]: [place], [place] [address] at 9:00 AM (two hours before your procedure to ensure your preparation). Free valet parking service is available.   Special note: Every effort is made to have your procedure done on time. Please understand that emergencies sometimes delay scheduled procedures.  2. Diet: Do not eat or drink anything after midnight prior to your procedure except sips of water to take medications.  3. Labs: You will need to have blood drawn on Friday, June 21 at [facility] at [facility]. [address]  Open: 7:30am - 5pm    Phone: [phone]. You do not need to be fasting.  4. Medication instructions in preparation for your procedure:  HOLD YOUR LOSARTAN HCTZ THE DAY BEFORE PROCEDURE AND THE MORNING OF    On the morning of your procedure, take your Aspirin and any morning medicines NOT listed above.  You may use sips of water.  5. Plan for one night stay--bring personal belongings. 6. Bring a current list of your medications  and current insurance cards. 7. You MUST have a responsible person to drive you home. 8. Someone MUST be with you the first 24 hours after you arrive home or your discharge will be delayed. 9. Please wear clothes that are easy to get on and off and wear slip-on shoes.  Thank you for allowing [place] to care for you!   -- [facility]     If you need a refill on your cardiac medications before your next appointment, please call your pharmacy.      Signed, Daune Perch, NP  04/16/2018 11:11 AM    [place]

## 2018-04-16 NOTE — Telephone Encounter (Signed)
Pt's medication was sent to pt's pharmacy as requested. Confirmation received.  °

## 2018-04-16 NOTE — H&P (View-Only) (Signed)
Cardiology Office Note:    Date:  04/16/2018   ID:  [name], DOB 1956/04/20, MRN [id number]  PCP:  Susy Frizzle, MD  Cardiologist:  Jenkins Rouge, MD  Referring MD: Susy Frizzle, MD   Chief Complaint  Patient presents with  . Chest Pain    History of Present Illness:    [name] is a 62 y.o. male with a past medical history significant for CAD with hx of CAD and CABG  X 4 in 2014 with Dr. Cyndia Bent he had brief PAF post op and was on amiodarone but is now off, maintaining SR. He also has OSA on CPAP, hypertension, hyperlipidemia, GERD, CKD,and is a smoker. He does not take statins as they all cause "pain".  Last myovue 09/02/17 with old inferolateral infarct no ischemia low risk EF 59%.   He was last ween in the office on 11/05/2017 by Dr. Johnsie Cancel and was doing well with no angina. Yesterday he called our office with complaints of intermittent chest pain with activity. He chewed up an aspirin but had no SL NTG to take. He was encouraged to go to the ED but declined. Case was reviewed with Dr. Johnsie Cancel who arranged for me to see the patient today to arrange for cardiac cath next week. A prescription for NTG was provided.   [name] is here with his wife. He states that at baseline he has occ chest tightness with stronger exertion, however last week he began to have more intense mid chest "pain" with less exertional adnm ore often, several times per day. It does not radiate. This morning he had chest pain 8/10, when he went out to sit on the porch. No shortness of breath, lightheadedness, palpitations, nausea, diaphoresis. He chewed an aspirin and ate breakfast and took a shower it resolved after about an hour. He did not have the SL NTG yet. No current chest pain.   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 He usually with no exertional chest pain or shortness of breath until this last week.      Past Medical History:  Diagnosis Date  . CHEST PAIN   . COLONIC POLYPS, HX OF   . CORONARY ARTERY DISEASE    a. s/p stent to RCA 2005;  b. Last LHC 12/07: Mid LAD 40%, proximal circumflex 20%, mid circumflex 95%, mid RCA stent okay, normal LV function. PCI: Taxus DES to the [facility];  c.  Myoview 10/11: EF 56%, no ischemia or scar    . DIVERTICULOSIS, COLON   . DYSPHAGIA UNSPECIFIED    intermittent  . GERD   . HEMORRHOIDS   . HYPERLIPIDEMIA   . HYPERTENSION   . HYPERTRIGLYCERIDEMIA   . OBESITY   . SLEEP APNEA    on CPAP    Past Surgical History:  Procedure Laterality Date  . ACHILLES TENDON REPAIR    . BACK SURGERY     lower back  . CARDIAC CATHETERIZATION    . CORONARY ARTERY BYPASS GRAFT  08/18/2012   Procedure: CORONARY ARTERY BYPASS GRAFTING (CABG);  Surgeon: Gaye Pollack, MD;  Location: [facility];  Service: Open Heart Surgery;  Laterality: N/A;  CABG x four;  using left internal mammary artery and right leg greater saphenous vein harvested endoscopically  . KNEE ARTHROSCOPY     RIGHT  . LUMBAR LAMINECTOMY/DECOMPRESSION MICRODISCECTOMY N/A 12/05/2015   Procedure: LUMBAR 2-3, LUMBAR 3-4  DECOMPRESSION ;  Surgeon: Phylliss Bob, MD;  Location: [facility];  Service: Orthopedics;  Laterality: N/A;  . Right long finger extensor tendon repair      Current Medications: Current Meds  Medication Sig  . aspirin EC 81 MG tablet Take 1 tablet (81 mg total) by mouth daily.  [name] losartan-hydrochlorothiazide (HYZAAR) 100-12.5 MG tablet TAKE 1 TABLET BY MOUTH ONCE DAILY  . nitroGLYCERIN (NITROSTAT) 0.4 MG SL tablet Place 1 tablet (0.4 mg total) under the tongue every 5 (five) minutes as needed for chest pain.  . [DISCONTINUED] ciprofloxacin (CIPRO) 500 MG tablet Take 1 tablet (500 mg total) by mouth 2 (two) times daily.  . [DISCONTINUED] HYDROmorphone (DILAUDID) 2 MG tablet Take 1 tablet (2 mg total) by mouth every 4 (four) hours as needed for severe pain.  . [DISCONTINUED] metroNIDAZOLE  (FLAGYL) 500 MG tablet Take 1 tablet (500 mg total) by mouth 2 (two) times daily.  . [DISCONTINUED] tamsulosin (FLOMAX) 0.4 MG CAPS capsule Take 1 capsule (0.4 mg total) by mouth daily.     Allergies:   Statins and Hydrocodone   Social History   Socioeconomic History  . Marital status: Married    Spouse name: Not on file  . Number of children: Not on file  . Years of education: Not on file  . Highest education level: Not on file  Occupational History  . Not on file  Social Needs  . Financial resource strain: Not on file  . Food insecurity:    Worry: Not on file    Inability: Not on file  . Transportation needs:    Medical: Not on file    Non-medical: Not on file  Tobacco Use  . Smoking status: Current Every Day Smoker    Packs/day: 1.50    Years: 43.00    Pack years: 64.50    Types: Cigarettes    Last attempt to quit: 08/18/2012    Years since quitting: 5.6  . Smokeless tobacco: Never Used  . Tobacco comment: Patient attempting to quit pt down to 1 ppd  Substance and Sexual Activity  . Alcohol use: Yes    Comment: 1-2 drink occasionally  . Drug use: No  . Sexual activity: Not on file  Lifestyle  . Physical activity:    Days per week: Not on file    Minutes per session: Not on file  . Stress: Not on file  Relationships  . Social connections:    Talks on phone: Not on file    Gets together: Not on file    Attends religious service: Not on file    Active member of club or organization: Not on file    Attends meetings of clubs or organizations: Not on file    Relationship status: Not on file  Other Topics Concern  . Not on file  Social History Narrative  . Not on file     Family History: The patient's family history includes Cancer in his mother; Heart disease in his brother. ROS:   Please see the history of present illness.     All other systems reviewed and are negative.  EKGs/Labs/Other Studies Reviewed:    The following studies were reviewed  today:  Exercise myocardial perfusion study 09/02/2017  The left ventricular ejection fraction is normal (55-65%).  Nuclear stress EF: 59%.  There was no ST segment deviation noted during stress.  No T wave inversion was noted during stress.  Defect 1: There is a medium defect of severe severity present  in the basal inferolateral, mid inferolateral and apical lateral location.  This is a low risk study.     EKG:  EKG is  ordered today.  The ekg ordered today demonstrates NSR, 74 bpm with mild non-specific ST changes  Recent Labs: 11/22/2017: ALT 31; BUN 22; Creatinine, Ser 1.87; Hemoglobin 14.4; Platelets 178; Potassium 3.9; Sodium 132   Recent Lipid Panel    Component Value Date/Time   CHOL 188 11/06/2015 0826   TRIG 201 (H) 11/06/2015 0826   HDL 34 (L) 11/06/2015 0826   CHOLHDL 5.5 (H) 11/06/2015 0826   VLDL 40 (H) 11/06/2015 0826   LDLCALC 114 11/06/2015 0826   LDLDIRECT 77.2 07/10/2009 0852    Physical Exam:    VS:  BP 136/80   Pulse 74   Ht 5\' 9"  (1.753 m)   Wt 215 lb (97.5 kg)   SpO2 95%   BMI 31.75 kg/m     Wt Readings from Last 3 Encounters:  04/16/18 215 lb (97.5 kg)  11/19/17 215 lb (97.5 kg)  11/05/17 217 lb 3.2 oz (98.5 kg)     Physical Exam  Constitutional: He is oriented to person, place, and time. He appears well-developed and well-nourished. No distress.  HENT:  Head: Normocephalic and atraumatic.  Neck: Normal range of motion. Neck supple. No JVD present.  Cardiovascular: Normal rate, regular rhythm, normal heart sounds and intact distal pulses. Exam reveals no gallop and no friction rub.  No murmur heard. Pulmonary/Chest: Effort normal and breath sounds normal. No respiratory distress. He has no wheezes. He has no rales.  Abdominal: Soft. Bowel sounds are normal. He exhibits no distension.  Musculoskeletal: Normal range of motion. He exhibits no edema or deformity.  Neurological: He is alert and oriented to person, place, and time.  Skin:  Skin is warm and dry.  Psychiatric: He has a normal mood and affect. His behavior is normal. Thought content normal.     ASSESSMENT:    1. Atherosclerosis of native coronary artery of native heart with unstable angina pectoris ([place])   2. Unstable angina ([place])   3. Essential hypertension   4. CKD (chronic kidney disease) stage 3, GFR 30-59 ml/min (HCC)   5. Hyperlipidemia, unspecified hyperlipidemia type   6. Statin intolerance    PLAN:    In order of problems listed above:  Unstable angina: Pt with progressively more frequent and more severe chest pain with activity over the last week. No associated symptoms. EKG with mild scooping of the ST segments. No current chest pain. Dr. Johnsie Cancel here and assessed pt. The patient dose not wish to go to the hospital today. He verymuch wants to wait until cath can be doen on Monday as an outpatient. He feels taht he has been stable and he had his wife understand when to call 911.  -Will arrange for left heart cath with graft evaluation on Monday. -SCR in 10/2017 was 1.87 (at time of kideny stone) prior SCr has been 1.3-1.58. Will check BMet, and give IV hydration. -Pt now has SL NTG, instructions for use reveiwed. Will start Imdur 30 mg daily.  -Will load with Plavix 300 mg followed by 75 mg daily. -Alarm symptoms reviewed with instructions to call EMS for CP unrelieved by rest and NTG or worsening symptoms.   The patient understands that risks included but are not limited to stroke (1 in 1000), death (1 in 17), kidney failure [usually temporary] (1 in 500), bleeding (1 in 200), allergic reaction [possibly serious] (1  in 200). He wishes to proceed.   CAD s/p CABG 2014: Managed with aspirin 81 mg, no statin or BB.   Hypertension: Losartan-HCTZ 100-12.5mg  daily. BP well controlled. Will hold these meds day before and day of cath for renal function.   Hyperlipidemia: Not on statin due to pt reports of "pain" on multiple statins. Last LDL in 10/2015  was 114. Consider adding Zetia, refer to lipid clinic for possible PCSK9-I.   Tobacco use: Still smoking 1 PPD. Smoking cessation strongly advised.   CKD: SCR in 10/2017 was 1.87 (at time of kideny stone) prior SCr has been 1.3-1.58. Will check BMet, and give IV hydration prior to cath.    Medication Adjustments/Labs and Tests Ordered: Current medicines are reviewed at length with the patient today.  Concerns regarding medicines are outlined above. Labs and tests ordered and medication changes are outlined in the patient instructions below:  Patient Instructions  Medication Instructions: Your physician has recommended you make the following change in your medication:  START: Isosorbide (IMDUR) 30 MG DAILY  Your physician wants you to take 300 mg of plavix today and then starting tomorrow take 75 mg of the plavix daily   Labwork: TODAY: BMET & CBC  Procedures/Testing: Your physician has requested that you have a cardiac catheterization. Cardiac catheterization is used to diagnose and/or treat various heart conditions. Doctors may recommend this procedure for a number of different reasons. The most common reason is to evaluate chest pain. Chest pain can be a symptom of coronary artery disease (CAD), and cardiac catheterization can show whether plaque is narrowing or blocking your heart's arteries. This procedure is also used to evaluate the valves, as well as measure the blood flow and oxygen levels in different parts of your heart. For further information please visit [email or web address]. Please follow instruction sheet, as given.    Follow-Up: Keep follow up appointment with Dr.Nishan on 06/04/18 at 8:45 AM  Your physician has put in a referral for you to see the lipid clinic   Any Additional Special Instructions Will Be Listed Below (If Applicable).   [facility] OFFICE [address] [place] [address] [place] [address] Dept: [phone] Loc: [phone]  [name]  04/16/2018  You are scheduled for a Cardiac Catheterization on Monday, June 24 with Dr. Quay Burow.  1. Please arrive at the [facility] (Main Entrance A) at [facility]: [place], [place] [address] at 9:00 AM (two hours before your procedure to ensure your preparation). Free valet parking service is available.   Special note: Every effort is made to have your procedure done on time. Please understand that emergencies sometimes delay scheduled procedures.  2. Diet: Do not eat or drink anything after midnight prior to your procedure except sips of water to take medications.  3. Labs: You will need to have blood drawn on Friday, June 21 at [facility] at [facility]. [address]  Open: 7:30am - 5pm    Phone: [phone]. You do not need to be fasting.  4. Medication instructions in preparation for your procedure:  HOLD YOUR LOSARTAN HCTZ THE DAY BEFORE PROCEDURE AND THE MORNING OF    On the morning of your procedure, take your Aspirin and any morning medicines NOT listed above.  You may use sips of water.  5. Plan for one night stay--bring personal belongings. 6. Bring a current list of your medications  and current insurance cards. 7. You MUST have a responsible person to drive you home. 8. Someone MUST be with you the first 24 hours after you arrive home or your discharge will be delayed. 9. Please wear clothes that are easy to get on and off and wear slip-on shoes.  Thank you for allowing [place] to care for you!   -- [facility]     If you need a refill on your cardiac medications before your next appointment, please call your pharmacy.      Signed, Daune Perch, NP  04/16/2018 11:11 AM    [place]

## 2018-04-19 ENCOUNTER — Ambulatory Visit (HOSPITAL_COMMUNITY)
Admission: RE | Admit: 2018-04-19 | Discharge: 2018-04-20 | Disposition: A | Discharge status: Home / Self Care | Payer: BC Managed Care – PPO | Source: Ambulatory Visit | Attending: Cardiovascular Disease | Admitting: Cardiovascular Disease

## 2018-04-19 ENCOUNTER — Other Ambulatory Visit: Payer: Self-pay

## 2018-04-19 ENCOUNTER — Encounter (HOSPITAL_COMMUNITY): Payer: Self-pay | Admitting: *Deleted

## 2018-04-19 ENCOUNTER — Encounter (HOSPITAL_COMMUNITY)
Admission: RE | Disposition: A | Discharge status: Home / Self Care | Payer: Self-pay | Source: Ambulatory Visit | Attending: Cardiovascular Disease

## 2018-04-19 DIAGNOSIS — G4733 Obstructive sleep apnea (adult) (pediatric): Secondary | ICD-10-CM | POA: Diagnosis not present

## 2018-04-19 DIAGNOSIS — Z885 Allergy status to narcotic agent status: Secondary | ICD-10-CM | POA: Diagnosis not present

## 2018-04-19 DIAGNOSIS — E781 Pure hyperglyceridemia: Secondary | ICD-10-CM | POA: Insufficient documentation

## 2018-04-19 DIAGNOSIS — I480 Paroxysmal atrial fibrillation: Secondary | ICD-10-CM | POA: Diagnosis not present

## 2018-04-19 DIAGNOSIS — I129 Hypertensive chronic kidney disease with stage 1 through stage 4 chronic kidney disease, or unspecified chronic kidney disease: Secondary | ICD-10-CM | POA: Diagnosis not present

## 2018-04-19 DIAGNOSIS — N183 Chronic kidney disease, stage 3 (moderate): Secondary | ICD-10-CM | POA: Diagnosis not present

## 2018-04-19 DIAGNOSIS — I25110 Atherosclerotic heart disease of native coronary artery with unstable angina pectoris: Secondary | ICD-10-CM | POA: Insufficient documentation

## 2018-04-19 DIAGNOSIS — Z6831 Body mass index (BMI) 31.0-31.9, adult: Secondary | ICD-10-CM | POA: Insufficient documentation

## 2018-04-19 DIAGNOSIS — E785 Hyperlipidemia, unspecified: Secondary | ICD-10-CM | POA: Insufficient documentation

## 2018-04-19 DIAGNOSIS — Z86010 Personal history of colonic polyps: Secondary | ICD-10-CM | POA: Insufficient documentation

## 2018-04-19 DIAGNOSIS — R1310 Dysphagia, unspecified: Secondary | ICD-10-CM | POA: Diagnosis not present

## 2018-04-19 DIAGNOSIS — Z955 Presence of coronary angioplasty implant and graft: Secondary | ICD-10-CM | POA: Insufficient documentation

## 2018-04-19 DIAGNOSIS — F17210 Nicotine dependence, cigarettes, uncomplicated: Secondary | ICD-10-CM | POA: Diagnosis not present

## 2018-04-19 DIAGNOSIS — K219 Gastro-esophageal reflux disease without esophagitis: Secondary | ICD-10-CM | POA: Diagnosis not present

## 2018-04-19 DIAGNOSIS — I2510 Atherosclerotic heart disease of native coronary artery without angina pectoris: Secondary | ICD-10-CM | POA: Diagnosis present

## 2018-04-19 DIAGNOSIS — E669 Obesity, unspecified: Secondary | ICD-10-CM | POA: Insufficient documentation

## 2018-04-19 DIAGNOSIS — Z888 Allergy status to other drugs, medicaments and biological substances status: Secondary | ICD-10-CM | POA: Diagnosis not present

## 2018-04-19 DIAGNOSIS — K5730 Diverticulosis of large intestine without perforation or abscess without bleeding: Secondary | ICD-10-CM | POA: Diagnosis not present

## 2018-04-19 DIAGNOSIS — I200 Unstable angina: Secondary | ICD-10-CM | POA: Diagnosis present

## 2018-04-19 DIAGNOSIS — Z7902 Long term (current) use of antithrombotics/antiplatelets: Secondary | ICD-10-CM | POA: Insufficient documentation

## 2018-04-19 DIAGNOSIS — Z7982 Long term (current) use of aspirin: Secondary | ICD-10-CM | POA: Insufficient documentation

## 2018-04-19 DIAGNOSIS — Z79899 Other long term (current) drug therapy: Secondary | ICD-10-CM | POA: Insufficient documentation

## 2018-04-19 HISTORY — DX: Chronic kidney disease, stage 3 (moderate): N18.3

## 2018-04-19 HISTORY — PX: LEFT HEART CATH AND CORS/GRAFTS ANGIOGRAPHY: CATH118250

## 2018-04-19 HISTORY — DX: Diverticulosis of intestine, part unspecified, without perforation or abscess without bleeding: K57.90

## 2018-04-19 HISTORY — DX: Chronic kidney disease, stage 3 unspecified: N18.30

## 2018-04-19 HISTORY — PX: CORONARY STENT INTERVENTION: CATH118234

## 2018-04-19 LAB — POCT ACTIVATED CLOTTING TIME: ACTIVATED CLOTTING TIME: 461 s

## 2018-04-19 SURGERY — LEFT HEART CATH AND CORS/GRAFTS ANGIOGRAPHY
Anesthesia: LOCAL

## 2018-04-19 MED ORDER — ACETAMINOPHEN 325 MG PO TABS: 650.0000 mg | ORAL_TABLET | ORAL | Status: DC | PRN

## 2018-04-19 MED ORDER — HYDROCHLOROTHIAZIDE 12.5 MG PO CAPS
12.5000 mg | ORAL_CAPSULE | Freq: Every day | ORAL | Status: DC
Start: 2018-04-19 — End: 2018-04-20
  Administered 2018-04-19 – 2018-04-20 (×2): 12.5 mg via ORAL
  Filled 2018-04-19 (×2): qty 1

## 2018-04-19 MED ORDER — BIVALIRUDIN TRIFLUOROACETATE 250 MG IV SOLR
INTRAVENOUS | Status: AC
  Filled 2018-04-19: qty 250

## 2018-04-19 MED ORDER — ONDANSETRON HCL 4 MG/2ML IJ SOLN
4.0000 mg | Freq: Four times a day (QID) | INTRAMUSCULAR | Status: DC | PRN
  Filled 2018-04-19: qty 2

## 2018-04-19 MED ORDER — LOSARTAN POTASSIUM 50 MG PO TABS
100.0000 mg | ORAL_TABLET | Freq: Every day | ORAL | Status: DC
Start: 2018-04-19 — End: 2018-04-20
  Administered 2018-04-19 – 2018-04-20 (×2): 100 mg via ORAL
  Filled 2018-04-19 (×2): qty 2

## 2018-04-19 MED ORDER — ATROPINE SULFATE 1 MG/10ML IJ SOSY
PREFILLED_SYRINGE | INTRAMUSCULAR | Status: AC
  Filled 2018-04-19: qty 10

## 2018-04-19 MED ORDER — CLOPIDOGREL BISULFATE 75 MG PO TABS: 75.0000 mg | ORAL_TABLET | Freq: Every day | ORAL | Status: DC

## 2018-04-19 MED ORDER — ANGIOPLASTY BOOK
Freq: Once | Status: AC
  Administered 2018-04-19: 22:00:00 1
  Filled 2018-04-19: qty 1

## 2018-04-19 MED ORDER — HYDRALAZINE HCL 20 MG/ML IJ SOLN
5.0000 mg | INTRAMUSCULAR | Status: AC | PRN
  Administered 2018-04-19: 18:00:00 5 mg via INTRAVENOUS
  Filled 2018-04-19: qty 1

## 2018-04-19 MED ORDER — NON FORMULARY: 3.0000 mg | Freq: Once | Status: DC

## 2018-04-19 MED ORDER — SODIUM CHLORIDE 0.9 % IV SOLN
1.7500 mg/kg/h | INTRAVENOUS | Status: AC
Start: 2018-04-19 — End: 2018-04-19
  Administered 2018-04-19: 1.75 mg/kg/h via INTRAVENOUS
  Filled 2018-04-19 (×2): qty 250

## 2018-04-19 MED ORDER — SODIUM CHLORIDE 0.9% FLUSH: 3.0000 mL | INTRAVENOUS | Status: DC | PRN

## 2018-04-19 MED ORDER — NITROGLYCERIN 1 MG/10 ML FOR IR/CATH LAB
INTRA_ARTERIAL | Status: AC
  Filled 2018-04-19: qty 10

## 2018-04-19 MED ORDER — SODIUM CHLORIDE 0.9 % WEIGHT BASED INFUSION
3.0000 mL/kg/h | INTRAVENOUS | Status: DC
  Administered 2018-04-19: 3 mL/kg/h via INTRAVENOUS

## 2018-04-19 MED ORDER — SODIUM CHLORIDE 0.9 % WEIGHT BASED INFUSION: 1.0000 mL/kg/h | INTRAVENOUS | Status: DC

## 2018-04-19 MED ORDER — SODIUM CHLORIDE 0.9 % IV SOLN
INTRAVENOUS | Status: AC
  Administered 2018-04-19 (×2): via INTRAVENOUS

## 2018-04-19 MED ORDER — ASPIRIN 81 MG PO CHEW: 81.0000 mg | CHEWABLE_TABLET | ORAL | Status: DC

## 2018-04-19 MED ORDER — FENTANYL CITRATE (PF) 100 MCG/2ML IJ SOLN
INTRAMUSCULAR | Status: AC
  Filled 2018-04-19: qty 2

## 2018-04-19 MED ORDER — BIVALIRUDIN BOLUS VIA INFUSION - CUPID
INTRAVENOUS | Status: DC | PRN
  Administered 2018-04-19: 73.125 mg via INTRAVENOUS

## 2018-04-19 MED ORDER — SODIUM CHLORIDE 0.9 % IV SOLN: 250.0000 mL | INTRAVENOUS | Status: DC | PRN

## 2018-04-19 MED ORDER — NITROGLYCERIN 1 MG/10 ML FOR IR/CATH LAB
INTRA_ARTERIAL | Status: DC | PRN
  Administered 2018-04-19: 200 ug via INTRACORONARY

## 2018-04-19 MED ORDER — SODIUM CHLORIDE 0.9% FLUSH
3.0000 mL | Freq: Two times a day (BID) | INTRAVENOUS | Status: DC
  Administered 2018-04-19: 18:00:00 3 mL via INTRAVENOUS

## 2018-04-19 MED ORDER — CLOPIDOGREL BISULFATE 300 MG PO TABS
ORAL_TABLET | ORAL | Status: AC
  Filled 2018-04-19: qty 2

## 2018-04-19 MED ORDER — ASPIRIN 81 MG PO CHEW: 81.0000 mg | CHEWABLE_TABLET | Freq: Every day | ORAL | Status: DC

## 2018-04-19 MED ORDER — MIDAZOLAM HCL 2 MG/2ML IJ SOLN
INTRAMUSCULAR | Status: AC
  Filled 2018-04-19: qty 2

## 2018-04-19 MED ORDER — MIDAZOLAM HCL 2 MG/2ML IJ SOLN
INTRAMUSCULAR | Status: DC | PRN
  Administered 2018-04-19 (×2): 1 mg via INTRAVENOUS

## 2018-04-19 MED ORDER — FAMOTIDINE IN NACL 20-0.9 MG/50ML-% IV SOLN
INTRAVENOUS | Status: AC
  Filled 2018-04-19: qty 50

## 2018-04-19 MED ORDER — LABETALOL HCL 5 MG/ML IV SOLN: 10.0000 mg | INTRAVENOUS | Status: AC | PRN

## 2018-04-19 MED ORDER — SODIUM CHLORIDE 0.9 % IV SOLN
INTRAVENOUS | Status: AC | PRN
  Administered 2018-04-19: 1.75 mg/kg/h via INTRAVENOUS

## 2018-04-19 MED ORDER — FAMOTIDINE IN NACL 20-0.9 MG/50ML-% IV SOLN
INTRAVENOUS | Status: AC | PRN
  Administered 2018-04-19: 20 mg via INTRAVENOUS

## 2018-04-19 MED ORDER — CLOPIDOGREL BISULFATE 75 MG PO TABS
75.0000 mg | ORAL_TABLET | Freq: Every day | ORAL | Status: DC
  Administered 2018-04-20: 75 mg via ORAL
  Filled 2018-04-19: qty 1

## 2018-04-19 MED ORDER — LIDOCAINE HCL (PF) 1 % IJ SOLN
INTRAMUSCULAR | Status: AC
  Filled 2018-04-19: qty 30

## 2018-04-19 MED ORDER — LOSARTAN POTASSIUM-HCTZ 100-12.5 MG PO TABS: 1.0000 | ORAL_TABLET | Freq: Every day | ORAL | Status: DC

## 2018-04-19 MED ORDER — HEPARIN (PORCINE) IN NACL 2-0.9 UNITS/ML
INTRAMUSCULAR | Status: AC | PRN
  Administered 2018-04-19 (×2): 500 mL

## 2018-04-19 MED ORDER — IOPAMIDOL (ISOVUE-370) INJECTION 76%
INTRAVENOUS | Status: AC
  Filled 2018-04-19: qty 125

## 2018-04-19 MED ORDER — ASPIRIN EC 81 MG PO TBEC
81.0000 mg | DELAYED_RELEASE_TABLET | Freq: Every day | ORAL | Status: DC
  Administered 2018-04-20: 08:00:00 81 mg via ORAL
  Filled 2018-04-19: qty 1

## 2018-04-19 MED ORDER — IOPAMIDOL (ISOVUE-370) INJECTION 76%
INTRAVENOUS | Status: DC | PRN
  Administered 2018-04-19: 80 mL

## 2018-04-19 MED ORDER — FENTANYL CITRATE (PF) 100 MCG/2ML IJ SOLN
INTRAMUSCULAR | Status: DC | PRN
  Administered 2018-04-19 (×2): 25 ug via INTRAVENOUS

## 2018-04-19 MED ORDER — CLOPIDOGREL BISULFATE 300 MG PO TABS
ORAL_TABLET | ORAL | Status: DC | PRN
  Administered 2018-04-19: 600 mg via ORAL

## 2018-04-19 MED ORDER — MELATONIN 3 MG PO TABS
3.0000 mg | ORAL_TABLET | Freq: Once | ORAL | Status: AC
  Administered 2018-04-19: 3 mg via ORAL
  Filled 2018-04-19 (×2): qty 1

## 2018-04-19 MED ORDER — LIDOCAINE HCL (PF) 1 % IJ SOLN
INTRAMUSCULAR | Status: DC | PRN
  Administered 2018-04-19: 15 mL

## 2018-04-19 MED ORDER — SODIUM CHLORIDE 0.9% FLUSH: 3.0000 mL | Freq: Two times a day (BID) | INTRAVENOUS | Status: DC

## 2018-04-19 SURGICAL SUPPLY — 19 items
BALLN SAPPHIRE 2.0X12 (BALLOONS) ×2
BALLN SAPPHIRE ~~LOC~~ 2.5X15 (BALLOONS) ×1 IMPLANT
BALLOON SAPPHIRE 2.0X12 (BALLOONS) IMPLANT
CATH INFINITI 5FR MULTPACK ANG (CATHETERS) ×1 IMPLANT
CATH VISTA GUIDE 6FR XB4 (CATHETERS) ×2 IMPLANT
KIT ENCORE 26 ADVANTAGE (KITS) ×1 IMPLANT
KIT ESSENTIALS PG (KITS) ×1 IMPLANT
KIT HEART LEFT (KITS) ×2 IMPLANT
PACK CARDIAC CATHETERIZATION (CUSTOM PROCEDURE TRAY) ×2 IMPLANT
SHEATH PINNACLE 5F 10CM (SHEATH) ×1 IMPLANT
SHEATH PINNACLE 6F 10CM (SHEATH) ×1 IMPLANT
STENT SYNERGY DES 2.25X20 (Permanent Stent) ×1 IMPLANT
SYR MEDRAD MARK V 150ML (SYRINGE) ×2 IMPLANT
TRANSDUCER W/STOPCOCK (MISCELLANEOUS) ×2 IMPLANT
TUBING CIL FLEX 10 FLL-RA (TUBING) ×2 IMPLANT
WIRE ASAHI PROWATER 180CM (WIRE) ×1 IMPLANT
WIRE EMERALD 3MM-J .035X150CM (WIRE) ×1 IMPLANT
WIRE EMERALD 3MM-J .035X260CM (WIRE) ×2 IMPLANT
WIRE HI TORQ VERSACORE-J 145CM (WIRE) ×1 IMPLANT

## 2018-04-19 NOTE — Interval H&P Note (Signed)
Cath Lab Visit (complete for each Cath Lab visit)  Clinical Evaluation Leading to the Procedure:   ACS: No.  Non-ACS:    Anginal Classification: CCS II  Anti-ischemic medical therapy: Minimal Therapy (1 class of medications)  Non-Invasive Test Results: No non-invasive testing performed  Prior CABG: Previous CABG      History and Physical Interval Note:  04/19/2018 12:55 PM  [name]  has presented today for surgery, with the diagnosis of angina  The various methods of treatment have been discussed with the patient and family. After consideration of risks, benefits and other options for treatment, the patient has consented to  Procedure(s): LEFT HEART CATH AND CORS/GRAFTS ANGIOGRAPHY (N/A) as a surgical intervention .  The patient's history has been reviewed, patient examined, no change in status, stable for surgery.  I have reviewed the patient's chart and labs.  Questions were answered to the patient's satisfaction.     Quay Burow

## 2018-04-19 NOTE — Progress Notes (Signed)
Site area: right groin  Site Prior to Removal:  Level 0  Pressure Applied For 20 MINUTES    Minutes Beginning at [place]:   Yes.    Patient Status During Pull:    Post Pull Groin Site:  Level 0  Post Pull Instructions Given:  Yes.    Post Pull Pulses Present:  Yes.    Dressing Applied:  Yes.    Comments:  Pt tolerated well

## 2018-04-20 ENCOUNTER — Encounter (HOSPITAL_COMMUNITY): Payer: Self-pay | Admitting: Cardiology

## 2018-04-20 ENCOUNTER — Telehealth: Payer: Self-pay | Admitting: Cardiovascular Disease

## 2018-04-20 DIAGNOSIS — N183 Chronic kidney disease, stage 3 (moderate): Secondary | ICD-10-CM | POA: Diagnosis not present

## 2018-04-20 DIAGNOSIS — Z86010 Personal history of colonic polyps: Secondary | ICD-10-CM | POA: Diagnosis not present

## 2018-04-20 DIAGNOSIS — I25110 Atherosclerotic heart disease of native coronary artery with unstable angina pectoris: Secondary | ICD-10-CM | POA: Diagnosis not present

## 2018-04-20 DIAGNOSIS — I200 Unstable angina: Secondary | ICD-10-CM | POA: Diagnosis not present

## 2018-04-20 DIAGNOSIS — I129 Hypertensive chronic kidney disease with stage 1 through stage 4 chronic kidney disease, or unspecified chronic kidney disease: Secondary | ICD-10-CM | POA: Diagnosis not present

## 2018-04-20 LAB — CBC
HEMATOCRIT: 41.2 % (ref 39.0–52.0)
HEMOGLOBIN: 13.6 g/dL (ref 13.0–17.0)
MCH: 30.1 pg (ref 26.0–34.0)
MCHC: 33 g/dL (ref 30.0–36.0)
MCV: 91.2 fL (ref 78.0–100.0)
Platelets: 157 10*3/uL (ref 150–400)
RBC: 4.52 MIL/uL (ref 4.22–5.81)
RDW: 12.2 % (ref 11.5–15.5)
WBC: 7.2 10*3/uL (ref 4.0–10.5)

## 2018-04-20 LAB — BASIC METABOLIC PANEL
ANION GAP: 6 (ref 5–15)
BUN: 16 mg/dL (ref 8–23)
CO2: 25 mmol/L (ref 22–32)
Calcium: 8.6 mg/dL — ABNORMAL LOW (ref 8.9–10.3)
Chloride: 108 mmol/L (ref 98–111)
Creatinine, Ser: 1.46 mg/dL — ABNORMAL HIGH (ref 0.61–1.24)
GFR, EST AFRICAN AMERICAN: 57 mL/min — AB (ref >60)
GFR, EST NON AFRICAN AMERICAN: 49 mL/min — AB (ref >60)
Glucose, Bld: 101 mg/dL — ABNORMAL HIGH (ref 70–99)
POTASSIUM: 4.1 mmol/L (ref 3.5–5.1)
Sodium: 139 mmol/L (ref 135–145)

## 2018-04-20 MED ORDER — PANTOPRAZOLE SODIUM 40 MG PO TBEC: 40.0000 mg | DELAYED_RELEASE_TABLET | Freq: Every day | ORAL | 1 refills | Status: DC

## 2018-04-20 NOTE — Progress Notes (Signed)
CARDIAC REHAB PHASE I   PRE:  Rate/Rhythm: 70 SR  BP:  Supine:   Sitting: 147/90  Standing:    SaO2: 98%RA  MODE:  Ambulation: 600 ft   POST:  Rate/Rhythm: 81 SR  BP:  Supine:   Sitting: 171/71  Standing:    SaO2: 99%RA [phone] Pt has been walking in hallways this morning. Walked 600 ft with me and no CP. Tolerated well. Education completed with pt who voiced understanding. Stressed importance of plavix with stent. Discussed smoking cessation. Pt quit once for a year after CABG. Gave smoking cessation handout and encouraged him to call 1800quitnow. Did not want fake cigarette. He is trying to get girlfriend to quit also. Gave heart healthy diet, ex ed, reviewed NTG use, and CRP 2. He attended [place] program before and does not want to attend again.   Graylon Good, RN BSN  04/20/2018 8:29 AM

## 2018-04-20 NOTE — Telephone Encounter (Signed)
New Message   [facility] appointment made on 05/04/2018 at 11:30 with Melina Copa per Pecolia Ades

## 2018-04-20 NOTE — Discharge Summary (Signed)
Discharge Summary    Patient ID: [name],  MRN: [id number], DOB/AGE: 07-17-1956 62 y.o.  Admit date: 04/19/2018 Discharge date: 04/20/2018  Primary Care Provider: Jenna Luo T Primary Cardiologist: Jenkins Rouge, MD  Discharge Diagnoses    Active Problems:   Unstable angina [facility])   CAD (coronary artery disease)   Allergies Allergies  Allergen Reactions  . Statins Other (See Comments)    Bone and muscle pain  . Hydrocodone Nausea And Vomiting    Diagnostic Studies/Procedures    CORONARY STENT INTERVENTION  LEFT HEART CATH AND CORS/GRAFTS ANGIOGRAPHY  Conclusion     Mid RCA to Dist RCA lesion is 100% stenosed.  Ost LAD to Prox LAD lesion is 100% stenosed.  Prox RCA to Mid RCA lesion is 75% stenosed.  Prox Cx to Mid Cx lesion is 95% stenosed.  Origin lesion is 100% stenosed.  A stent was successfully placed.  Post intervention, there is a 0% residual stenosis.  LV end diastolic pressure is mildly elevated.  The left ventricular ejection fraction is 50-55% by visual estimate.  The left ventricular systolic function is normal.   IMPRESSION: Mr. [name] had an occluded circumflex obtuse marginal branch vein graft at the aorta.  He had high-grade obtuse marginal branch "in-stent restenosis which I intervened on using a synergy drug-eluting stent with an excellent result.  He tolerated the procedure well.  Angiomax will continue for 4 hours and full dose and will be discontinued.  After that the sheath will be removed.  He will be gently hydrated overnight and treated with dual antiplatelet therapy.  He will be discharged home in the morning and follow-up with Dr. Johnsie Cancel as an outpatient.  Recommend uninterrupted dual antiplatelet therapy with Aspirin 81mg  daily and Clopidogrel 75mg  daily for a minimum of 12 months (ACS - Class I recommendation). _____________ Diagnostic Diagram       Post-Intervention Diagram           History of Present  Illness     [name] is a 62 y.o. male with a past medical history significant for CAD with hx of CAD and CABG X 4 in 2014 with Dr. Cyndia Bent he had brief PAF post op and was on amiodarone but is now off, maintaining SR. He also has OSA on CPAP, hypertension, hyperlipidemia, GERD, CKD,and is a smoker. He does not take statins as they all cause "pain".    He presented to the office on Friday, 04/16/18 with intermittent exertional chest pain several times per day, no shortness of breath or other associated symptoms. He was loaded with Plavix and started on Imdur. He was scheduled for cardiac cath on Monday, 04/19/18.   Hospital Course     Consultants: None  Left heart cath on 04/19/18 revealed an occluded SVG to circumflex obtuse marginal branch. He had high-grade obtuse marginal branch in-stent restenosis of old stent and a DES was placed to open that stented area with good result. Recommend uninterrupted dual antiplatelet therapy with Aspirin 81mg  daily and Clopidogrel 75mg  daily for a minimum of 12 months (ACS - Class I recommendation).  Hypertension: Losartan-HCTZ 100-12.5mg  daily. BP well controlled. Meds eld prior to cath for renal function, now resumed.   Hyperlipidemia: Not on statin due to pt reports of "pain" on multiple statins. Last LDL in 10/2015 was 114. Consider adding Zetia, referal to lipid clinic for possible PCSK9-I has been arranged.   Tobacco use: Still smoking 1 PPD. Smoking cessation strongly advised.  CKD: SCR in 10/2017 was 1.87 (at time of kideny stone) prior SCr has been 1.3-1.58. SCr prior to cath- 1.57. Pt was hydrated pre-cath and over night. SCr this am post cath is 1.46. Will recheck at follow up.    Patient has been seen by Dr. Johnsie Cancel today and deemed ready for discharge home. All follow up appointments have been scheduled. Discharge medications are listed below. _____________  Discharge Vitals Blood pressure (!) 171/71, pulse (!) 59, temperature 97.9 F  (36.6 C), resp. rate (!) 22, height 5\' 9"  (1.753 m), weight 216 lb 0.8 oz (98 kg), SpO2 98 %.  Filed Weights   04/19/18 0913 04/20/18 0635  Weight: 215 lb (97.5 kg) 216 lb 0.8 oz (98 kg)    Labs & Radiologic Studies    CBC Recent Labs    04/20/18 0232  WBC 7.2  HGB 13.6  HCT 41.2  MCV 91.2  PLT 315   Basic Metabolic Panel Recent Labs    04/20/18 0232  NA 139  K 4.1  CL 108  CO2 25  GLUCOSE 101*  BUN 16  CREATININE 1.46*  CALCIUM 8.6*   Liver Function Tests No results for input(s): AST, ALT, ALKPHOS, BILITOT, PROT, ALBUMIN in the last 72 hours. No results for input(s): LIPASE, AMYLASE in the last 72 hours. Cardiac Enzymes No results for input(s): CKTOTAL, CKMB, CKMBINDEX, TROPONINI in the last 72 hours. BNP Invalid input(s): POCBNP D-Dimer No results for input(s): DDIMER in the last 72 hours. Hemoglobin A1C No results for input(s): HGBA1C in the last 72 hours. Fasting Lipid Panel No results for input(s): CHOL, HDL, LDLCALC, TRIG, CHOLHDL, LDLDIRECT in the last 72 hours. Thyroid Function Tests No results for input(s): TSH, T4TOTAL, T3FREE, THYROIDAB in the last 72 hours.  Invalid input(s): FREET3 _____________  No results found. Disposition   Pt is being discharged home today in good condition.  Follow-up Plans & Appointments    Follow-up Information    Charlie Pitter, PA-C Follow up.   Specialties:  Cardiology, Radiology Why:  Cardiology hospital follow up on July 9th at 11:30. Please arrive 15 minutes early for check in.  Contact information: [address] [place] [address] [place] [address] [phone]          Discharge Instructions    AMB Referral to Cardiac Rehabilitation - Phase II   Complete by:  As directed    Diagnosis:  Coronary Stents   Amb Referral to Cardiac Rehabilitation   Complete by:  As directed    Diagnosis:  Coronary Stents   Diet - low sodium heart healthy   Complete by:  As directed    Discharge instructions    Complete by:  As directed    PLEASE REMEMBER TO BRING ALL OF YOUR MEDICATIONS TO EACH OF YOUR FOLLOW-UP OFFICE VISITS.  PLEASE ATTEND ALL SCHEDULED FOLLOW-UP APPOINTMENTS.   Activity: Increase activity slowly as tolerated. You may shower, but no soaking baths (or swimming) for 1 week. No driving for 24 hours. No lifting over 5 lbs for 1 week. No sexual activity for 1 week.   You May Return to Work: On Monday July 1st  Wound Care: You may wash cath site gently with soap and water. Keep cath site clean and dry. If you notice pain, swelling, bleeding or pus at your cath site, please call [phone].   Increase activity slowly   Complete by:  As directed       Discharge Medications   Allergies as of 04/20/2018  Reactions   Statins Other (See Comments)   Bone and muscle pain   Hydrocodone Nausea And Vomiting      Medication List    STOP taking these medications   omeprazole 20 MG capsule Commonly known as:  PRILOSEC     TAKE these medications   aspirin EC 81 MG tablet Take 1 tablet (81 mg total) by mouth daily.   clopidogrel 75 MG tablet Commonly known as:  PLAVIX Take 1 tablet (75 mg total) by mouth daily.   losartan-hydrochlorothiazide 100-12.5 MG tablet Commonly known as:  HYZAAR TAKE 1 TABLET BY MOUTH ONCE DAILY   nitroGLYCERIN 0.4 MG SL tablet Commonly known as:  NITROSTAT Place 1 tablet (0.4 mg total) under the tongue every 5 (five) minutes as needed for chest pain.   pantoprazole 40 MG tablet Commonly known as:  PROTONIX Take 1 tablet (40 mg total) by mouth daily.        Acute coronary syndrome (MI, NSTEMI, STEMI, etc) this admission?: Yes.     AHA/ACC Clinical Performance & Quality Measures: 1. Aspirin prescribed? - Yes 2. ADP Receptor Inhibitor (Plavix/Clopidogrel, Brilinta/Ticagrelor or Effient/Prasugrel) prescribed (includes medically managed patients)? - Yes 3. Beta Blocker prescribed? - No -     4. High Intensity Statin (Lipitor 40-80mg  or  Crestor 20-40mg ) prescribed? - No - statin intolerant. Referred to lipid clinic for possible PCSK9I 5. EF assessed during THIS hospitalization? - Yes 6. For EF <40%, was ACEI/ARB prescribed? - Not Applicable (EF >/= 61%) 7. For EF <40%, Aldosterone Antagonist (Spironolactone or Eplerenone) prescribed? - Not Applicable (EF >/= 68%) 8. Cardiac Rehab Phase II ordered (Included Medically managed Patients)? - Yes     Outstanding Labs/Studies   BMet at follow up  Duration of Discharge Encounter   Greater than 30 minutes including physician time.  Signed, Luna Fuse, NP 04/20/2018, 8:45 AM

## 2018-04-20 NOTE — Progress Notes (Signed)
Progress Note  Patient Name: [name] Date of Encounter: 04/20/2018  Primary Cardiologist: Jenkins Rouge, MD   Subjective   It's all good no angina   Inpatient Medications    Scheduled Meds: . aspirin EC  81 mg Oral Daily  . clopidogrel  75 mg Oral Q breakfast  . losartan  100 mg Oral Daily   And  . hydrochlorothiazide  12.5 mg Oral Daily  . sodium chloride flush  3 mL Intravenous Q12H   Continuous Infusions: . sodium chloride     PRN Meds: sodium chloride, acetaminophen, ondansetron (ZOFRAN) IV, sodium chloride flush   Vital Signs    Vitals:   04/19/18 2300 04/20/18 0000 04/20/18 0100 04/20/18 0635  BP: 134/62 (!) 119/48 (!) 135/50 (!) 150/63  Pulse:    65  Resp: (!) 21 19 20 19   Temp:    97.8 F (36.6 C)  TempSrc:    Oral  SpO2: 100% 99%    Weight:    216 lb 0.8 oz (98 kg)  Height:        Intake/Output Summary (Last 24 hours) at 04/20/2018 0648 Last data filed at 04/20/2018 0640 Gross per 24 hour  Intake 1818.01 ml  Output 1880 ml  Net -61.99 ml   Filed Weights   04/19/18 0913 04/20/18 0635  Weight: 215 lb (97.5 kg) 216 lb 0.8 oz (98 kg)    Telemetry    NSR 04/20/2018  - Personally Reviewed  ECG    NSR no acute ST changes  - Personally Reviewed  Physical Exam  Right femoral artery no hematoma  GEN: No acute distress.   Neck: No JVD Cardiac: RRR, no murmurs, rubs, or gallops.  Respiratory: Clear to auscultation bilaterally. GI: Soft, nontender, non-distended  MS: No edema; No deformity. Neuro:  Nonfocal  Psych: Normal affect   Labs    Chemistry Recent Labs  Lab 04/16/18 1122 04/20/18 0232  NA 137 139  K 4.5 4.1  CL 103 108  CO2 21 25  GLUCOSE 97 101*  BUN 21 16  CREATININE 1.57* 1.46*  CALCIUM 9.4 8.6*  GFRNONAA 46* 49*  GFRAA 53* 57*  ANIONGAP  --  6     Hematology Recent Labs  Lab 04/16/18 1122 04/20/18 0232  WBC 7.9 7.2  RBC 4.77 4.52  HGB 14.8 13.6  HCT 42.4 41.2  MCV 89 91.2  MCH 31.0 30.1  MCHC 34.9  33.0  RDW 12.9 12.2  PLT 198 157    Cardiac EnzymesNo results for input(s): TROPONINI in the last 168 hours. No results for input(s): TROPIPOC in the last 168 hours.   BNPNo results for input(s): BNP, PROBNP in the last 168 hours.   DDimer No results for input(s): DDIMER in the last 168 hours.   Radiology    No results found.  Cardiac Studies   Cath reviewed occluded SVG OM stent native circumflex   Patient Profile     62 y.o. 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      Signed, Jenkins Rouge, MD  04/20/2018, 6:48 AM

## 2018-04-20 NOTE — Telephone Encounter (Signed)
The pt is being discharged from the hosp today. Will call tomorrow. 

## 2018-04-21 NOTE — Telephone Encounter (Signed)
Patient contacted regarding discharge from [facility] on 04/20/18.  Patient understands to follow up with provider Melina Copa, PA-c on 05/04/18 at 11:30 at [facility] in [place]. Patient understands discharge instructions? Yes Patient understands medications and regiment? Yes Patient understands to bring all medications to this visit? Yes

## 2018-04-23 ENCOUNTER — Telehealth (HOSPITAL_COMMUNITY): Payer: Self-pay

## 2018-04-23 ENCOUNTER — Telehealth: Payer: Self-pay | Admitting: Cardiovascular Disease

## 2018-04-23 NOTE — Telephone Encounter (Signed)
Referral recv'd, patient is not interested in participate in the Cardiac Rehab Program. ° °Closed referral. °

## 2018-04-23 NOTE — Telephone Encounter (Signed)
New Message   Pt states he is returning call for [employer]

## 2018-04-26 NOTE — Telephone Encounter (Signed)
The pt states that he called because I left a message on his VM asking him to call me. I advised him that I forgot to tell him when we had our TCM conversation that I had left a message on his VM asking him to call me. He is advised that the TCM call was all I needed.  He thanked me for calling him back and states that he is still doing well since his hosp d/c.

## 2018-05-03 ENCOUNTER — Encounter: Payer: Self-pay | Admitting: Physician Assistant

## 2018-05-03 NOTE — Progress Notes (Signed)
Cardiology Office Note    Date:  05/04/2018  ID:  [name], DOB 05/15/1956, MRN [id number] PCP:  Susy Frizzle, MD  Cardiologist:  Jenkins Rouge, MD   Chief Complaint: f/u PCI  History of Present Illness:  [name] is a 62 y.o. male with history of CAD s/p PCI of RCA 2005, PCI of Cx 2007, CABGx4 in 2014, PCI of OM 03/2018, post-op atrial fib (on amiodarone briefly), OSA on CPAP, hypertension, hyperlipidemia, obesity, GERD, CKD stage III, tobacco abuse, statin intolerance who presents for post-hospital follow-up.  Recently admitted 03/2018 following recent outpatient OV with exertional chest pain. He was loaded with Plavix as outpatient and underwent left heart cath on 04/19/18 revealed an occluded SVG to circumflex obtuse marginal branch.He had high-grade obtuse marginal branch in-stent restenosis of old stent and a DES was placed to open that stented area with good result. DAPT for minimum of 12 months recommended. Regarding renal function, prior Cr has run 1.3-1.6 (as high as 1.87 in setting of kidney stone). Last labs 03/2018 showed K 4.1, Cr 1.46, normal CBC, normal LFTS 10/2017, 2017 LDL 114.  He returns for follow-up feeling great. Chest pain completely resolved. No dyspnea. He's working on cutting down smoking. Has f/u with pharmacist this Friday to discuss options for hyperlipidemia treatment. He's not sure if they were planning on getting labs but I suspect they'll get a CMET and lipid profile.  Past Medical History:  Diagnosis Date  . CKD (chronic kidney disease) stage 3, GFR 30-59 ml/min (HCC)   . COLONIC POLYPS, HX OF   . CORONARY ARTERY DISEASE    a. s/p stent to RCA 2005. b. Stent to Cx 2007. c.  CABG X4 2014; d. LHC 04/19/18 occluded SVG-Circ, DES to in-stent restenosis of circ.   . Diverticulosis   . DIVERTICULOSIS, COLON   . DYSPHAGIA UNSPECIFIED    intermittent  . GERD   . HEMORRHOIDS   . HYPERLIPIDEMIA   . HYPERTENSION   . HYPERTRIGLYCERIDEMIA   .  OBESITY   . Postoperative atrial fibrillation ([place]) 2014  . SLEEP APNEA    on CPAP  . Statin intolerance   . Tobacco abuse     Past Surgical History:  Procedure Laterality Date  . ACHILLES TENDON REPAIR    . BACK SURGERY     lower back  . CARDIAC CATHETERIZATION    . CORONARY ARTERY BYPASS GRAFT  08/18/2012   Procedure: CORONARY ARTERY BYPASS GRAFTING (CABG);  Surgeon: Gaye Pollack, MD;  Location: [facility];  Service: Open Heart Surgery;  Laterality: N/A;  CABG x four;  using left internal mammary artery and right leg greater saphenous vein harvested endoscopically  . CORONARY STENT INTERVENTION N/A 04/19/2018   Procedure: CORONARY STENT INTERVENTION;  Surgeon: Lorretta Harp, MD;  Location: [facility];  Service: Cardiovascular;  Laterality: N/A;  . KNEE ARTHROSCOPY     RIGHT  . LEFT HEART CATH AND CORS/GRAFTS ANGIOGRAPHY N/A 04/19/2018   Procedure: LEFT HEART CATH AND CORS/GRAFTS ANGIOGRAPHY;  Surgeon: Lorretta Harp, MD;  Location: [facility];  Service: Cardiovascular;  Laterality: N/A;  . LUMBAR LAMINECTOMY/DECOMPRESSION MICRODISCECTOMY N/A 12/05/2015   Procedure: LUMBAR 2-3, LUMBAR 3-4 DECOMPRESSION ;  Surgeon: Phylliss Bob, MD;  Location: [facility];  Service: Orthopedics;  Laterality: N/A;  . Right long finger extensor tendon repair      Current Medications: Current Meds  Medication Sig  . aspirin EC 81 MG tablet Take 1 tablet (  81 mg total) by mouth daily.  . clopidogrel (PLAVIX) 75 MG tablet Take 1 tablet (75 mg total) by mouth daily.  [name] losartan-hydrochlorothiazide (HYZAAR) 100-12.5 MG tablet TAKE 1 TABLET BY MOUTH ONCE DAILY  . nitroGLYCERIN (NITROSTAT) 0.4 MG SL tablet Place 1 tablet (0.4 mg total) under the tongue every 5 (five) minutes as needed for chest pain.  . pantoprazole (PROTONIX) 40 MG tablet Take 1 tablet (40 mg total) by mouth daily.   Allergies:   Statins and Hydrocodone   Social History   Socioeconomic History  . Marital status: Married     Spouse name: Not on file  . Number of children: Not on file  . Years of education: Not on file  . Highest education level: Not on file  Occupational History  . Not on file  Social Needs  . Financial resource strain: Not on file  . Food insecurity:    Worry: Not on file    Inability: Not on file  . Transportation needs:    Medical: Not on file    Non-medical: Not on file  Tobacco Use  . Smoking status: Current Every Day Smoker    Packs/day: 1.50    Years: 43.00    Pack years: 64.50    Types: Cigarettes    Last attempt to quit: 08/18/2012    Years since quitting: 5.7  . Smokeless tobacco: Never Used  . Tobacco comment: Patient attempting to quit pt down to 1 ppd  Substance and Sexual Activity  . Alcohol use: Yes    Comment: 1-2 drink occasionally  . Drug use: No  . Sexual activity: Not on file  Lifestyle  . Physical activity:    Days per week: Not on file    Minutes per session: Not on file  . Stress: Not on file  Relationships  . Social connections:    Talks on phone: Not on file    Gets together: Not on file    Attends religious service: Not on file    Active member of club or organization: Not on file    Attends meetings of clubs or organizations: Not on file    Relationship status: Not on file  Other Topics Concern  . Not on file  Social History Narrative  . Not on file     Family History:  The patient's family history includes Cancer in his mother; Heart disease in his brother.  ROS:   Please see the history of present illness.  All other systems are reviewed and otherwise negative.    PHYSICAL EXAM:   VS:  BP 124/72   Pulse 91   Ht 5\' 9"  (1.753 m)   Wt 210 lb (95.3 kg)   SpO2 95%   BMI 31.01 kg/m   BMI: Body mass index is 31.01 kg/m. GEN: Well nourished, well developed WM, in no acute distress HEENT: normocephalic, atraumatic Neck: no JVD, carotid bruits, or masses Cardiac: RRR; no murmurs, rubs, or gallops, no edema  Respiratory:  clear to  auscultation bilaterally, normal work of breathing GI: soft, nontender, nondistended, + BS MS: no deformity or atrophy Skin: warm and dry, no rash. Right groin cath site without hematoma, ecchymosis, or bruit. Neuro:  Alert and Oriented x 3, Strength and sensation are intact, follows commands Psych: euthymic mood, full affect  Wt Readings from Last 3 Encounters:  05/04/18 210 lb (95.3 kg)  04/20/18 216 lb 0.8 oz (98 kg)  04/16/18 215 lb (97.5 kg)  Studies/Labs Reviewed:   EKG:   EKG was not ordered today  Recent Labs: 11/22/2017: ALT 31 04/20/2018: BUN 16; Creatinine, Ser 1.46; Hemoglobin 13.6; Platelets 157; Potassium 4.1; Sodium 139   Lipid Panel    Component Value Date/Time   CHOL 188 11/06/2015 0826   TRIG 201 (H) 11/06/2015 0826   HDL 34 (L) 11/06/2015 0826   CHOLHDL 5.5 (H) 11/06/2015 0826   VLDL 40 (H) 11/06/2015 0826   LDLCALC 114 11/06/2015 0826   LDLDIRECT 77.2 07/10/2009 0852    Additional studies/ records that were reviewed today include: Summarized above.    ASSESSMENT & PLAN:   1. CAD - doing great s/p PCI. Continue dual antiplatelet therapy. Discussed importance of risk factor modification with tobacco cessation and lipid control. 2. Essential HTN - controlled. 3. Hyperlipidemia - sees pharmD this Friday. I anticipate they'll be getting CMET/lipid profile at which time his creatinine can be reassessed as well. 4. CKD stage III - as above. Discussed avoidance of NSAIDS. 5. Tobacco abuse - counseled on importance of cessation. 6. GERD - requesting refill of Protonix. Has been on PPI long term, he states, for acid reflux. Will refill x 3 months but ask that he get back in to see his PCP to discuss long-term management as PPIs have fallen out of favor for chronic use due to bone density issues and hypomagnesemia. He agrees to do so.  Disposition: F/u with Dr. Johnsie Cancel in 6 months.   Medication Adjustments/Labs and Tests Ordered: Current medicines are  reviewed at length with the patient today.  Concerns regarding medicines are outlined above. Medication changes, Labs and Tests ordered today are summarized above and listed in the Patient Instructions accessible in Encounters.   Signed, Charlie Pitter, PA-C  05/04/2018 11:53 AM    [address], [place], [place]  [address] Phone: [phone]; Fax: [phone]

## 2018-05-04 ENCOUNTER — Encounter: Payer: Self-pay | Admitting: Physician Assistant

## 2018-05-04 ENCOUNTER — Encounter (INDEPENDENT_AMBULATORY_CARE_PROVIDER_SITE_OTHER): Payer: Self-pay

## 2018-05-04 ENCOUNTER — Ambulatory Visit: Payer: BC Managed Care – PPO | Admitting: Physician Assistant

## 2018-05-04 VITALS — BP 124/72 | HR 91 | Ht 69.0 in | Wt 210.0 lb

## 2018-05-04 DIAGNOSIS — I10 Essential (primary) hypertension: Secondary | ICD-10-CM | POA: Diagnosis not present

## 2018-05-04 DIAGNOSIS — N183 Chronic kidney disease, stage 3 unspecified: Secondary | ICD-10-CM

## 2018-05-04 DIAGNOSIS — K219 Gastro-esophageal reflux disease without esophagitis: Secondary | ICD-10-CM

## 2018-05-04 DIAGNOSIS — Z720 Tobacco use: Secondary | ICD-10-CM

## 2018-05-04 DIAGNOSIS — E785 Hyperlipidemia, unspecified: Secondary | ICD-10-CM | POA: Diagnosis not present

## 2018-05-04 DIAGNOSIS — I2510 Atherosclerotic heart disease of native coronary artery without angina pectoris: Secondary | ICD-10-CM

## 2018-05-04 MED ORDER — PANTOPRAZOLE SODIUM 40 MG PO TBEC: 40.0000 mg | DELAYED_RELEASE_TABLET | Freq: Every day | ORAL | 0 refills | Status: DC

## 2018-05-04 NOTE — Patient Instructions (Signed)
Medication Instructions:  Your physician has recommended you make the following change in your medication:  1. Start Protonix ( 40 mg ) tablet by mouth daily. Sent in today to patient's requested pharmacy.    Labwork: -None  Testing/Procedures: -None  Follow-Up: Your physician wants you to follow-up in: 6 month with Dr. Johnsie Cancel.  You will receive a reminder letter in the mail two months in advance. If you don't receive a letter, please call our office to schedule the follow-up appointment.   Any Other Special Instructions Will Be Listed Below (If Applicable).     If you need a refill on your cardiac medications before your next appointment, please call your pharmacy.

## 2018-05-07 ENCOUNTER — Ambulatory Visit (INDEPENDENT_AMBULATORY_CARE_PROVIDER_SITE_OTHER): Payer: BC Managed Care – PPO | Admitting: Pharmacist

## 2018-05-07 VITALS — Wt 208.0 lb

## 2018-05-07 DIAGNOSIS — Z720 Tobacco use: Secondary | ICD-10-CM | POA: Diagnosis not present

## 2018-05-07 DIAGNOSIS — E782 Mixed hyperlipidemia: Secondary | ICD-10-CM | POA: Diagnosis not present

## 2018-05-07 LAB — LIPID PANEL
CHOL/HDL RATIO: 4.3 ratio (ref 0.0–5.0)
Cholesterol, Total: 146 mg/dL (ref 100–199)
HDL: 34 mg/dL — ABNORMAL LOW (ref >39)
LDL Calculated: 87 mg/dL (ref 0–99)
Triglycerides: 127 mg/dL (ref 0–149)
VLDL CHOLESTEROL CAL: 25 mg/dL (ref 5–40)

## 2018-05-07 LAB — COMPREHENSIVE METABOLIC PANEL
A/G RATIO: 2 (ref 1.2–2.2)
ALT: 18 IU/L (ref 0–44)
AST: 16 IU/L (ref 0–40)
Albumin: 4.4 g/dL (ref 3.6–4.8)
Alkaline Phosphatase: 49 IU/L (ref 39–117)
BILIRUBIN TOTAL: 0.5 mg/dL (ref 0.0–1.2)
BUN / CREAT RATIO: 15 (ref 10–24)
BUN: 22 mg/dL (ref 8–27)
CHLORIDE: 103 mmol/L (ref 96–106)
CO2: 21 mmol/L (ref 20–29)
Calcium: 9.4 mg/dL (ref 8.6–10.2)
Creatinine, Ser: 1.46 mg/dL — ABNORMAL HIGH (ref 0.76–1.27)
GFR calc non Af Amer: 50 mL/min/{1.73_m2} — ABNORMAL LOW (ref >59)
GFR, EST AFRICAN AMERICAN: 58 mL/min/{1.73_m2} — AB (ref >59)
Globulin, Total: 2.2 g/dL (ref 1.5–4.5)
Glucose: 96 mg/dL (ref 65–99)
Potassium: 4.3 mmol/L (ref 3.5–5.2)
Sodium: 141 mmol/L (ref 134–144)
TOTAL PROTEIN: 6.6 g/dL (ref 6.0–8.5)

## 2018-05-07 MED ORDER — ROSUVASTATIN CALCIUM 5 MG PO TABS: 5.0000 mg | ORAL_TABLET | Freq: Every day | ORAL | 11 refills | Status: DC

## 2018-05-07 NOTE — Progress Notes (Addendum)
Patient ID: [name]                 DOB: 10-27-1956                    MRN: [id number]     HPI: [name] is a 62 y.o. male patient referred to lipid clinic by Melina Copa, PA. PMH is significant for CAD s/p PCI of RCA 2005, PCI of Cx 2007, CABG x4 in 2014, PCI of OM in 03/2018, post-op afib, OSA on CPAP, HTN, HLD, obesity, GERD, CKD III, tobacco abuse, and statin intolerance. No lipid panel on file in the past 2.5 years.   Pt presents today in good spirits. He recalls trying pravastatin and simvastatin in the past, both of which caused joint pain. He has not tried any other cholesterol medications that he recalls. He has been focusing on his diet over the past 3 weeks. He has stopped eating ham biscuits and is eating more fruit, vegetables, lean meats, and whole grain carbs. He has lost 6 lbs in 2 weeks. He stays active with work.  Continues to smoke, down from 2 PPD to 1 PPD. He previously quit for 1 year after he had open heart surgery. Not interested in trying new medications today. Reports he does not smoke inside at all.  Current Medications: none Intolerances: pravastatin 20mg  daily, simvastatin 40mg  daily - joint pain Risk Factors: CAD s/p multiple stents and CABG, obesity, CKD, tobacco abuse LDL goal: 70mg /dL  Diet: Eating more fish, fruit and salads recently. Avoiding red meat. Used to eat a lot of ham biscuits, changed his diet 3 weeks ago.   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  Family History: Cancer in his mother; Heart disease in his brother. 2 brothers with open heart surgery.  Social History: Current smoker 1.5 PPD. Occasional alcohol use, denies illicit drug use.  Labs: 11/06/15: TC 188, TG 201, HDL 34, LDL 114  Past Medical History:  Diagnosis Date  . CKD (chronic kidney disease) stage 3, GFR 30-59 ml/min (HCC)   . COLONIC POLYPS, HX OF   . CORONARY ARTERY DISEASE    a. s/p stent to  RCA 2005. b. Stent to Cx 2007. c.  CABG X4 2014; d. LHC 04/19/18 occluded SVG-Circ, DES to in-stent restenosis of circ.   . Diverticulosis   . DIVERTICULOSIS, COLON   . DYSPHAGIA UNSPECIFIED    intermittent  . GERD   . HEMORRHOIDS   . HYPERLIPIDEMIA   . HYPERTENSION   . HYPERTRIGLYCERIDEMIA   . OBESITY   . Postoperative atrial fibrillation ([place]) 2014  . SLEEP APNEA    on CPAP  . Statin intolerance   . Tobacco abuse     Current Outpatient Medications on File Prior to Visit  Medication Sig Dispense Refill  . aspirin EC 81 MG tablet Take 1 tablet (81 mg total) by mouth daily. 90 tablet 3  . clopidogrel (PLAVIX) 75 MG tablet Take 1 tablet (75 mg total) by mouth daily. 90 tablet 3  . losartan-hydrochlorothiazide (HYZAAR) 100-12.5 MG tablet TAKE 1 TABLET BY MOUTH ONCE DAILY 90 tablet 3  . nitroGLYCERIN (NITROSTAT) 0.4 MG SL tablet Place 1 tablet (0.4 mg total) under the tongue every 5 (five) minutes as needed for chest pain. 75 tablet 1  . pantoprazole (PROTONIX) 40 MG tablet Take 1 tablet (40 mg total) by mouth daily. 30 tablet 1  . pantoprazole (PROTONIX)  40 MG tablet Take 1 tablet (40 mg total) by mouth daily. 90 tablet 0   No current facility-administered medications on file prior to visit.     Allergies  Allergen Reactions  . Statins Other (See Comments)    Bone and muscle pain  . Hydrocodone Nausea And Vomiting    Assessment/Plan:  1. Hyperlipidemia - Will check baseline lipids today since pt has not had cholesterol checked in 2.5 years. LDL goal < 70 due to history of ASCVD. Depending on lipid panel results, will start either low dose rosuvastatin or PCSK9i injections.  2. Tobacco abuse - Pt has cut back from smoking 2 PPD to 1 PPD. Encouraged complete tobacco cessation. Pt is not interested in any smoking cessation aids at this time.   Megan E. Supple, PharmD, BCACP, [facility] [address], [place], [place] [address] Phone: [phone];  Fax: [phone] 05/07/2018 9:58 AM  Addendum: Baseline LDL back at 87 close to goal < 70. Will start rosuvastatin 5mg  daily and recheck lipids and LFTs in 3 months. Advised pt to call clinic if he experiences any side effects and we can decrease frequency to every other day. Pt is aware of lipid plan.

## 2018-05-07 NOTE — Addendum Note (Signed)
Addended by: Kharisma Glasner E on: 05/07/2018 04:21 PM   Modules accepted: Orders

## 2018-05-07 NOTE — Patient Instructions (Signed)
We will check your cholesterol today  Depending on your results, we will either start a low dose of Crestor (rosuvastatin) or the injectable medication (Praluent or Repatha)  I will call you with your results and medication plan

## 2018-05-21 ENCOUNTER — Encounter: Payer: Self-pay | Admitting: Family Medicine

## 2018-05-21 ENCOUNTER — Ambulatory Visit: Payer: BC Managed Care – PPO | Admitting: Family Medicine

## 2018-05-21 VITALS — BP 112/62 | HR 92 | Temp 98.2°F | Ht 69.0 in | Wt 211.4 lb

## 2018-05-21 DIAGNOSIS — R059 Cough, unspecified: Secondary | ICD-10-CM

## 2018-05-21 DIAGNOSIS — R05 Cough: Secondary | ICD-10-CM

## 2018-05-21 DIAGNOSIS — J0140 Acute pansinusitis, unspecified: Secondary | ICD-10-CM

## 2018-05-21 MED ORDER — MOMETASONE FUROATE 50 MCG/ACT NA SUSP: 2.0000 | Freq: Every day | NASAL | 0 refills | Status: DC

## 2018-05-21 MED ORDER — GUAIFENESIN ER 600 MG PO TB12: 600.0000 mg | ORAL_TABLET | Freq: Two times a day (BID) | ORAL | Status: DC

## 2018-05-21 MED ORDER — CETIRIZINE HCL 10 MG PO TABS
10.0000 mg | ORAL_TABLET | Freq: Every day | ORAL | Status: DC
Start: 2018-05-21 — End: 2018-09-15

## 2018-05-21 MED ORDER — DOXYCYCLINE HYCLATE 100 MG PO TABS: 100.0000 mg | ORAL_TABLET | Freq: Two times a day (BID) | ORAL | 0 refills | Status: AC

## 2018-05-21 MED ORDER — BENZONATATE 100 MG PO CAPS: 100.0000 mg | ORAL_CAPSULE | Freq: Three times a day (TID) | ORAL | 0 refills | Status: DC | PRN

## 2018-05-21 MED ORDER — PREDNISONE 20 MG PO TABS: 40.0000 mg | ORAL_TABLET | Freq: Every day | ORAL | 0 refills | Status: AC

## 2018-05-21 NOTE — Progress Notes (Signed)
Patient ID: [name], male    DOB: 08/21/1956, 62 y.o.   MRN: [id number]  PCP: Susy Frizzle, MD  Chief Complaint  Patient presents with  . URI    Nasal congestion,productive cough, denies fever    Subjective:   [name] is a 62 y.o. male, presents to clinic with CC of nasal congestion, productive cough x 2 weeks. URI and cough sx for more than 2 weeks gradually worsening with congestion and clear to white discharge.  Patient reports a history of emphysema, he is a current smoker, per chart review history of chronic bronchitis, also has history of CAD with recent hospitalization for angioplasty/cardiac cath.     Patient Active Problem List   Diagnosis Date Noted  . Mixed hyperlipidemia 05/07/2018  . CAD (coronary artery disease) 04/19/2018  . Unstable angina ([place]) 04/16/2018  . CKD (chronic kidney disease) stage 3, GFR 30-59 ml/min (HCC) 04/16/2018  . 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 PAF (paroxysmal atrial fibrillation) ([place]) 09/10/2012  . Tobacco abuse 07/01/2012  . HYPERTRIGLYCERIDEMIA 06/19/2009  . Elevated lipids 06/19/2009  . OBESITY 06/19/2009  . Essential hypertension 06/19/2009  . Chronic bronchitis ([place]) 06/19/2009  . CHEST PAIN 06/19/2009  . DYSPHAGIA UNSPECIFIED 06/19/2009  . Coronary atherosclerosis 03/01/2008  . HEMORRHOIDS 03/01/2008  . GERD 03/01/2008  . DIVERTICULOSIS, COLON 03/01/2008  . Obstructive sleep apnea 03/01/2008  . COLONIC POLYPS, HX OF 03/01/2008     Prior to Admission medications   Medication Sig Start Date End Date Taking? Authorizing Provider  aspirin EC 81 MG tablet Take 1 tablet (81 mg total) by mouth daily. 03/26/17  Yes Isaiah Serge, NP  clopidogrel (PLAVIX) 75 MG tablet Take 1 tablet (75 mg total) by mouth daily. 04/16/18  Yes Daune Perch, NP  losartan-hydrochlorothiazide (HYZAAR) 100-12.5 MG tablet TAKE 1 TABLET BY MOUTH ONCE DAILY 03/31/18  Yes Susy Frizzle, MD  pantoprazole (PROTONIX) 40 MG tablet  Take 1 tablet (40 mg total) by mouth daily. 05/04/18  Yes Dunn, Dayna N, PA-C  rosuvastatin (CRESTOR) 5 MG tablet Take 1 tablet (5 mg total) by mouth daily. 05/07/18 08/05/18 Yes Dunn, Dayna N, PA-C  nitroGLYCERIN (NITROSTAT) 0.4 MG SL tablet Place 1 tablet (0.4 mg total) under the tongue every 5 (five) minutes as needed for chest pain. Patient not taking: Reported on 05/21/2018 04/16/18 07/15/18  Josue Hector, MD     Allergies  Allergen Reactions  . Statins Other (See Comments)    Bone and muscle pain  . Hydrocodone Nausea And Vomiting     Family History  Problem Relation Age of Onset  . Cancer Mother   . Heart disease Brother      Social History   Socioeconomic History  . Marital status: Married    Spouse name: Not on file  . Number of children: Not on file  . Years of education: Not on file  . Highest education level: Not on file  Occupational History  . Not on file  Social Needs  . Financial resource strain: Not on file  . Food insecurity:    Worry: Not on file    Inability: Not on file  . Transportation needs:    Medical: Not on file    Non-medical: Not on file  Tobacco Use  . Smoking status: Current Every Day Smoker    Packs/day: 1.50    Years: 43.00    Pack years: 64.50    Types: Cigarettes    Last attempt to  quit: 08/18/2012    Years since quitting: 5.7  . Smokeless tobacco: Never Used  . Tobacco comment: Patient attempting to quit pt down to 1 ppd  Substance and Sexual Activity  . Alcohol use: Yes    Comment: 1-2 drink occasionally  . Drug use: No  . Sexual activity: Not on file  Lifestyle  . Physical activity:    Days per week: Not on file    Minutes per session: Not on file  . Stress: Not on file  Relationships  . Social connections:    Talks on phone: Not on file    Gets together: Not on file    Attends religious service: Not on file    Active member of club or organization: Not on file    Attends meetings of clubs or organizations: Not on  file    Relationship status: Not on file  . Intimate partner violence:    Fear of current or ex partner: Not on file    Emotionally abused: Not on file    Physically abused: Not on file    Forced sexual activity: Not on file  Other Topics Concern  . Not on file  Social History Narrative  . Not on file     Review of Systems  Constitutional: Negative.  Negative for activity change, appetite change, chills, diaphoresis, fatigue, fever and unexpected weight change.  HENT: Positive for congestion, postnasal drip, rhinorrhea and sinus pressure.   Eyes: Negative.   Respiratory: Positive for cough.   Cardiovascular: Negative.  Negative for chest pain, palpitations and leg swelling.  Gastrointestinal: Negative.   Endocrine: Negative.   Genitourinary: Negative.   Musculoskeletal: Negative.   Skin: Negative.  Negative for color change and rash.  Neurological: Negative.   Hematological: Negative.   Psychiatric/Behavioral: Negative.   All other systems reviewed and are negative.      Objective:    Vitals:   05/21/18 1613  BP: 112/62  Pulse: 92  Temp: 98.2 F (36.8 C)  TempSrc: Oral  SpO2: 94%  Weight: 211 lb 6 oz (95.9 kg)  Height: 5\' 9"  (1.753 m)      Physical Exam  Constitutional: He is oriented to person, place, and time. He appears well-developed and well-nourished.  Non-toxic appearance. He does not appear ill. No distress.  HENT:  Head: Normocephalic and atraumatic.  Right Ear: Tympanic membrane, external ear and ear canal normal.  Left Ear: Tympanic membrane, external ear and ear canal normal.  Nose: Mucosal edema and rhinorrhea present. No epistaxis. Right sinus exhibits no maxillary sinus tenderness and no frontal sinus tenderness. Left sinus exhibits no maxillary sinus tenderness and no frontal sinus tenderness.  Mouth/Throat: Uvula is midline and mucous membranes are normal. No trismus in the jaw. No uvula swelling. Posterior oropharyngeal erythema present. No  oropharyngeal exudate or posterior oropharyngeal edema. No tonsillar exudate.  B/l nares not patent, edematous and erythematous nasal mucosa with copious nasal discharge and with b/l enlarged turbinates Posterior oropharynx diffusely injected without edema or exudate, tonsils not visualized, uvula midline  Eyes: Pupils are equal, round, and reactive to light. Conjunctivae, EOM and lids are normal. Right eye exhibits no discharge. Left eye exhibits no discharge. No scleral icterus.  Neck: Trachea normal, normal range of motion and phonation normal. Neck supple. No tracheal deviation present.  Cardiovascular: Normal rate, regular rhythm, normal heart sounds and normal pulses. Exam reveals no gallop and no friction rub.  No murmur heard. Pulses:      Radial pulses  are 2+ on the right side, and 2+ on the left side.  No LE edema  Pulmonary/Chest: Effort normal. No accessory muscle usage. No tachypnea. No respiratory distress. He has decreased breath sounds. He has no wheezes. He has rhonchi. He has no rales.  Increased AP diameter, no retractions or accessory muscle use, decreased breath sounds throughout all lung fields with scattered rhonchi, no wheeze auscultated, no rails Frequent wet sounding cough  Abdominal: Soft. Normal appearance and bowel sounds are normal. He exhibits no distension. There is no tenderness. There is no guarding.  Musculoskeletal: Normal range of motion. He exhibits no edema.  Lymphadenopathy:    He has no cervical adenopathy.  Neurological: He is alert and oriented to person, place, and time. Gait normal.  Skin: Skin is warm, dry and intact. Capillary refill takes less than 2 seconds. No rash noted. He is not diaphoretic. No pallor.  Psychiatric: He has a normal mood and affect. His speech is normal and behavior is normal.  Nursing note and vitals reviewed.         Assessment & Plan:      ICD-10-CM   1. Acute pansinusitis, recurrence not specified J01.40   2.  Cough R05    chest congestion with productive cough x 2 weeks with hx of emphysema/COPD, CAD, current smoker, recent hospitalization    URI and cough sx for more than 2 weeks gradually worsening with congestion and clear to white discharge.  Patient reports a history of emphysema, he is a current smoker, per chart review history of chronic bronchitis, also has history of CAD with recent hospitalization for angioplasty/cardiac cath.    Do suspect viral sinusitis and bronchitis, however given prolonged duration of symptoms, his comorbidities and his recent hospitalization will cover with antibiotics and treat with steroid, antihistamine, nasal steroid spray, expectorants and cough suppressants.    If patient does not improve the next week would recommend chest x-ray to further eval.   Pt denied any shortness of breath, wheeze or chest tightness, he denies having any inhalers prescribed to him, did not hear any wheeze today and he did not want to try a breathing treatment however he does not improve, the addition of SABA may help his cough.    He was well appearing , VSS, no distress  Delsa Grana, PA-C 05/21/18 4:27 PM

## 2018-06-03 IMAGING — CT CT HEAD W/O CM
3 series · 15 of 47 positions shown, 18 images · non-contrast
Comparison: 09/23/2016 brain MR

CLINICAL DATA: Acute dizziness

EXAM:
CT HEAD WITHOUT CONTRAST
TECHNIQUE: Contiguous axial images were obtained from the base of the skull
through the vertex without intravenous contrast.

[Series 3: head 5.0 h30s · axial · 0.50mm/px · z∈[-45,+90]mm · 9 of 33 slices shown, 12 images]
[im 3/33  brain]
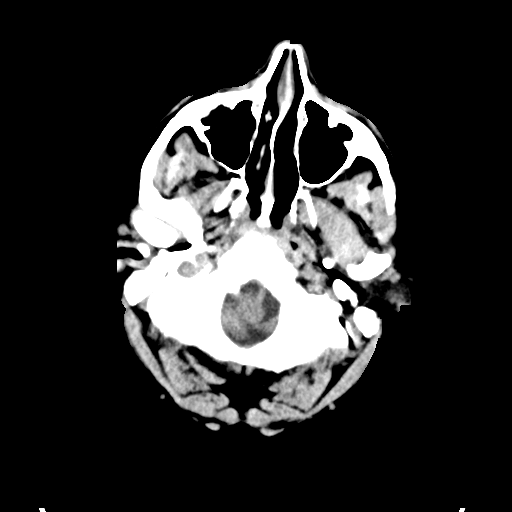
[im 3/33  bone]
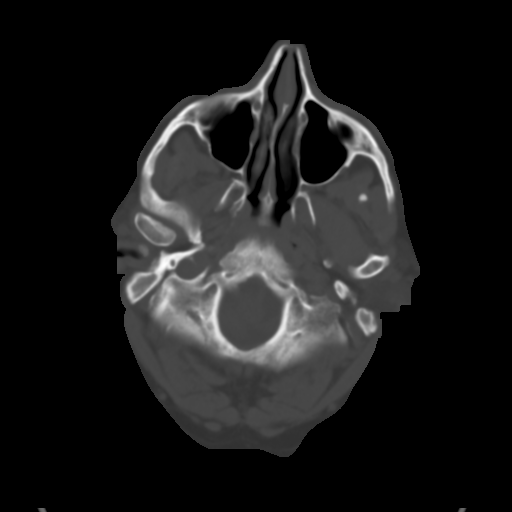
[im 6/33  brain]
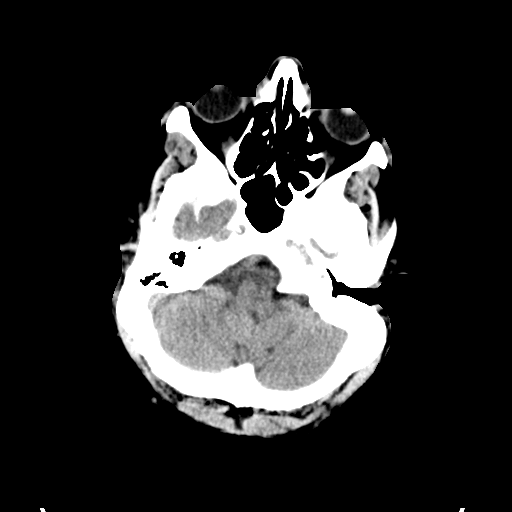
[im 9/33  brain]
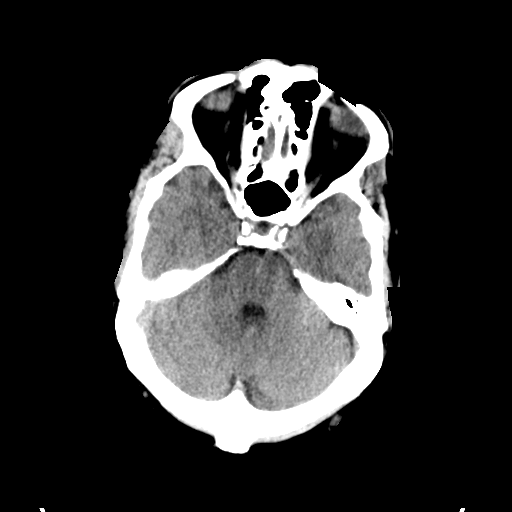
[im 13/33  brain]
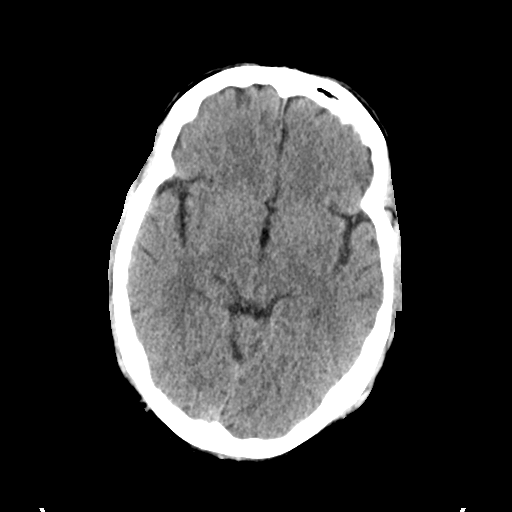
[im 17/33  brain]
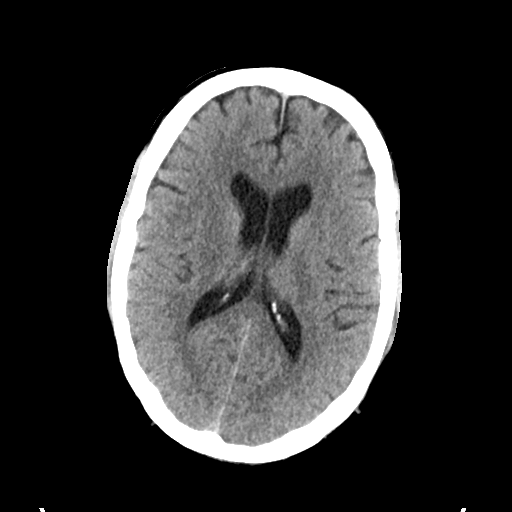
[im 17/33  bone]
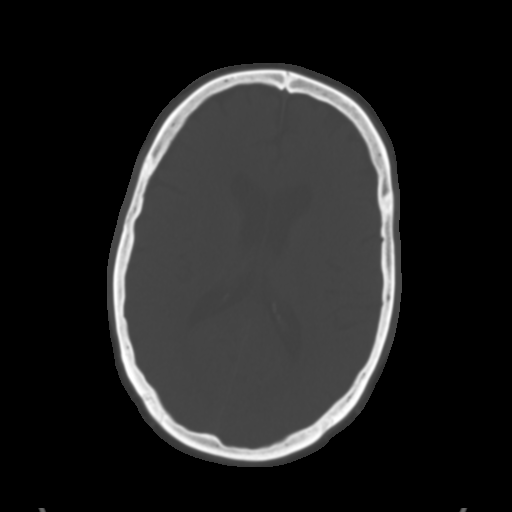
[im 20/33  brain]
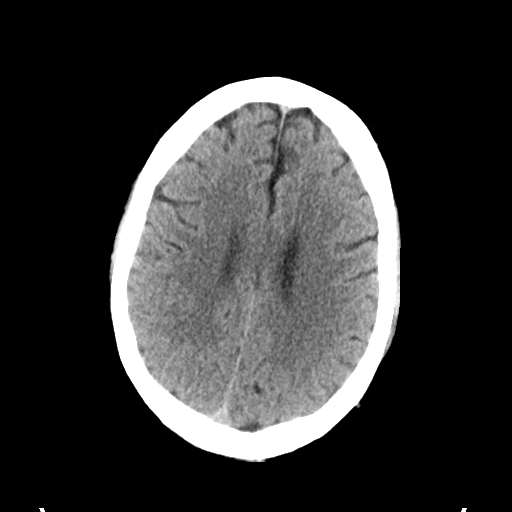
[im 24/33  brain]
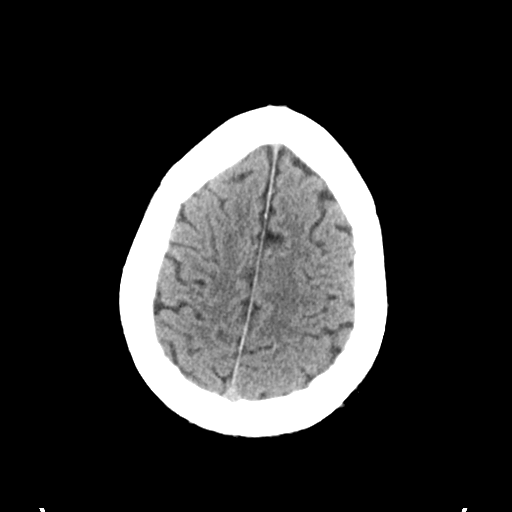
[im 27/33  brain]
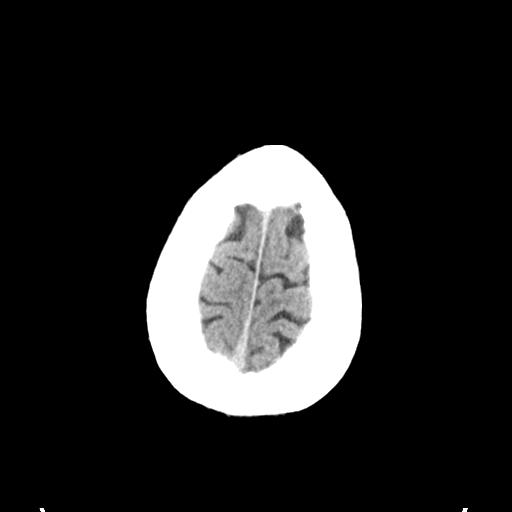
[im 30/33  brain]
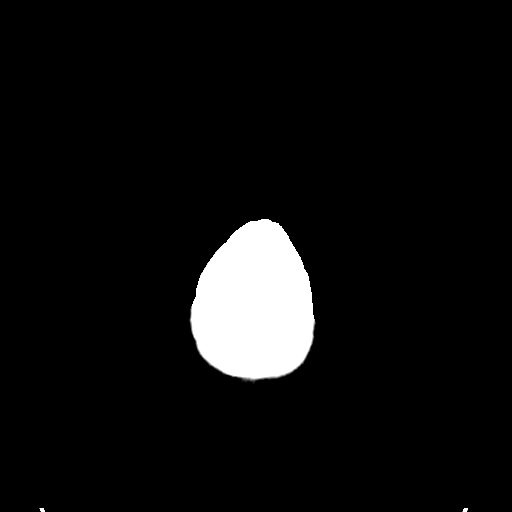
[im 30/33  bone]
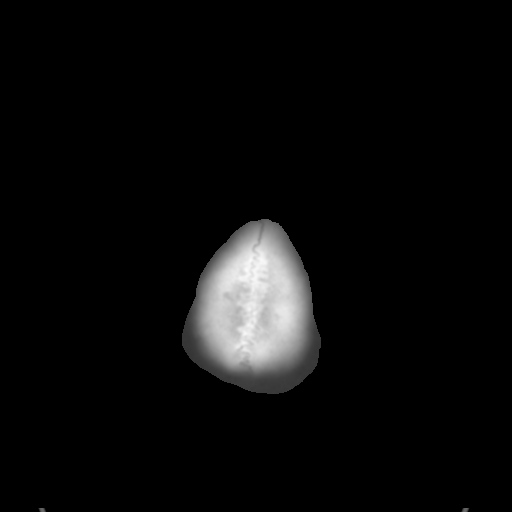

[Series 5: head 3.0 mpr cor · coronal · 0.36mm/px · 3 of 67 slices shown]
[im 23/67  brain]
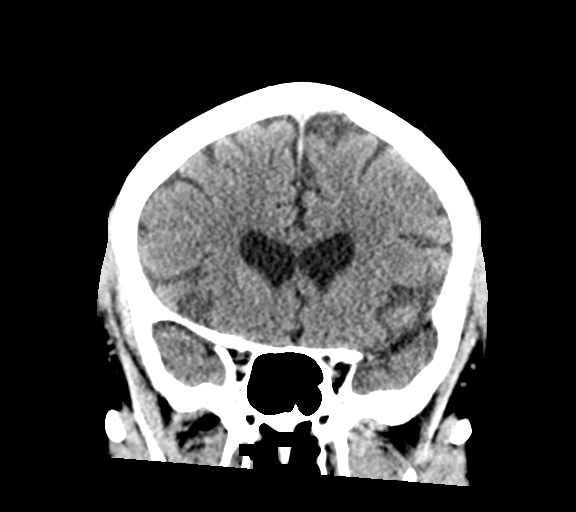
[im 30/67  brain]
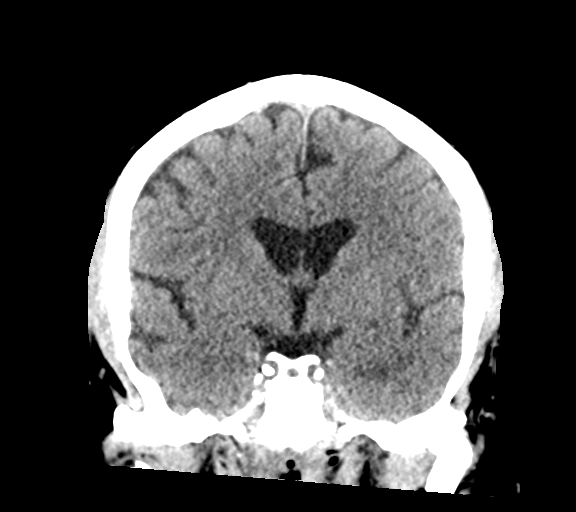
[im 37/67  brain]
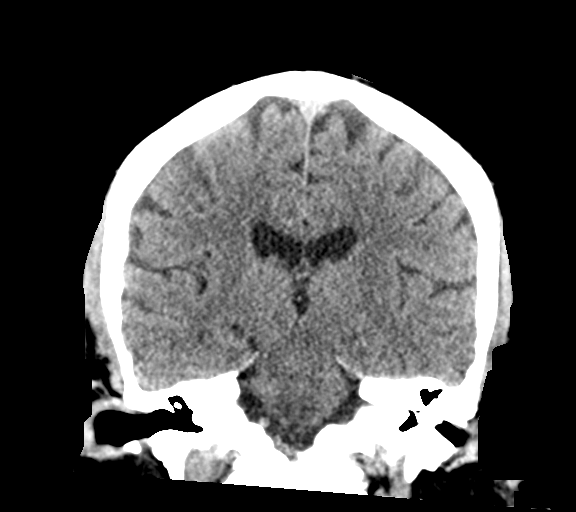

[Series 6: head 3.0 mpr sag · sagittal · 0.32mm/px · 3 of 67 slices shown]
[im 23/67  brain]
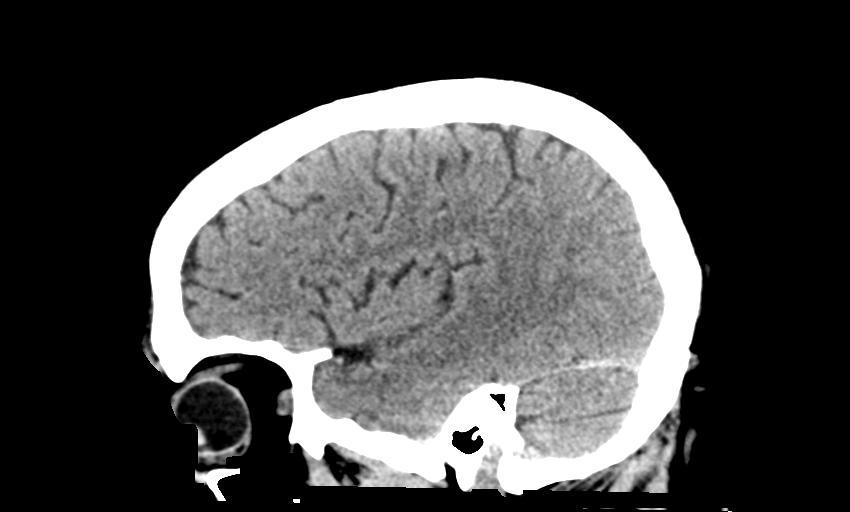
[im 34/67  brain]
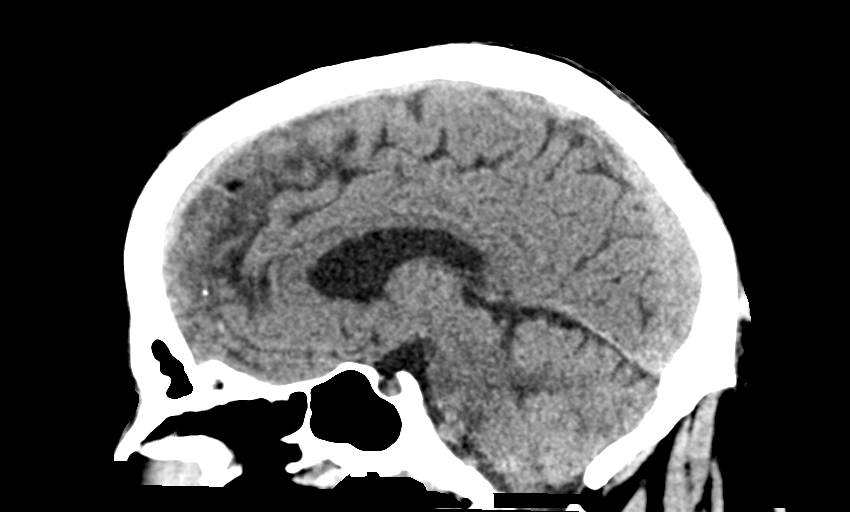
[im 45/67  brain]
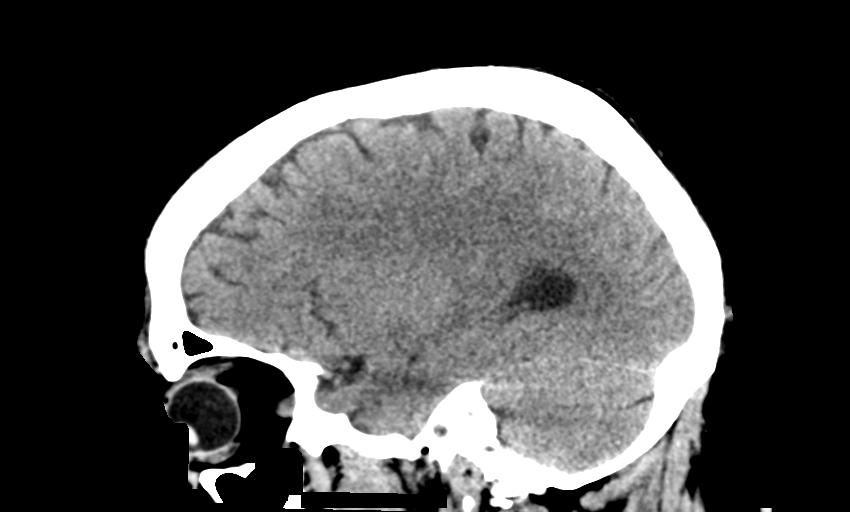

[15 of 47 positions shown; findings below may reference images not displayed]

FINDINGS: Brain: No evidence of acute infarction, hemorrhage, hydrocephalus,
extra-axial collection or mass lesion/mass effect.

Vascular: No hyperdense vessel or unexpected calcification.

Skull: Normal. Negative for fracture or focal lesion.

Sinuses/Orbits: No acute finding.

Other: None.
IMPRESSION: Normal head CT without contrast

## 2018-06-04 ENCOUNTER — Ambulatory Visit: Payer: Self-pay | Admitting: Cardiovascular Disease

## 2018-08-02 ENCOUNTER — Other Ambulatory Visit: Payer: BC Managed Care – PPO | Admitting: *Deleted

## 2018-08-02 DIAGNOSIS — E782 Mixed hyperlipidemia: Secondary | ICD-10-CM

## 2018-08-02 LAB — LIPID PANEL
CHOL/HDL RATIO: 3.3 ratio (ref 0.0–5.0)
Cholesterol, Total: 145 mg/dL (ref 100–199)
HDL: 44 mg/dL (ref >39)
LDL CALC: 71 mg/dL (ref 0–99)
Triglycerides: 151 mg/dL — ABNORMAL HIGH (ref 0–149)
VLDL Cholesterol Cal: 30 mg/dL (ref 5–40)

## 2018-08-02 LAB — HEPATIC FUNCTION PANEL
ALK PHOS: 49 IU/L (ref 39–117)
ALT: 15 IU/L (ref 0–44)
AST: 19 IU/L (ref 0–40)
Albumin: 4.2 g/dL (ref 3.6–4.8)
BILIRUBIN, DIRECT: 0.17 mg/dL (ref 0.00–0.40)
Bilirubin Total: 0.6 mg/dL (ref 0.0–1.2)
Total Protein: 6.1 g/dL (ref 6.0–8.5)

## 2018-09-14 ENCOUNTER — Encounter: Payer: Self-pay | Admitting: Nurse Practitioner

## 2018-09-14 ENCOUNTER — Telehealth: Payer: Self-pay | Admitting: Pulmonary Disease

## 2018-09-14 NOTE — Telephone Encounter (Signed)
Called and spoke with patient he stated that he will need a report showing that he is complaint with his CPAP machine. Report has been printed and placed up front. Nothing further needed.

## 2018-09-15 ENCOUNTER — Ambulatory Visit: Payer: BC Managed Care – PPO | Admitting: Nurse Practitioner

## 2018-09-15 ENCOUNTER — Telehealth: Payer: Self-pay | Admitting: Pulmonary Disease

## 2018-09-15 ENCOUNTER — Encounter: Payer: Self-pay | Admitting: Nurse Practitioner

## 2018-09-15 VITALS — BP 132/80 | HR 74 | Ht 69.0 in | Wt 214.0 lb

## 2018-09-15 DIAGNOSIS — E782 Mixed hyperlipidemia: Secondary | ICD-10-CM

## 2018-09-15 DIAGNOSIS — I2510 Atherosclerotic heart disease of native coronary artery without angina pectoris: Secondary | ICD-10-CM

## 2018-09-15 DIAGNOSIS — I10 Essential (primary) hypertension: Secondary | ICD-10-CM | POA: Diagnosis not present

## 2018-09-15 DIAGNOSIS — Z720 Tobacco use: Secondary | ICD-10-CM

## 2018-09-15 NOTE — Patient Instructions (Addendum)
We will be checking the following labs today - NONE  If you have labs (blood work) drawn today and your tests are completely normal, you will receive your results only by: [name] MyChart Message (if you have MyChart) OR . A paper copy in the mail If you have any lab test that is abnormal or we need to change your treatment, we will call you to review the results.   Medication Instructions:    Continue with your current medicines.    If you need a refill on your cardiac medications before your next appointment, please call your pharmacy.     Testing/Procedures To Be Arranged:  N/A  Follow-Up:   See Dr. Johnsie Cancel in January ( has recall )    At [facility], you and your health needs are our priority.  As part of our continuing mission to provide you with exceptional heart care, we have created designated Provider Care Teams.  These Care Teams include your primary Cardiologist (physician) and Advanced Practice Providers (APPs -  Physician Assistants and Nurse Practitioners) who all work together to provide you with the care you need, when you need it.  Special Instructions:  . Smoking cessation is again recommended.   Call the [place] office at [phone] if you have any questions, problems or concerns.

## 2018-09-15 NOTE — Telephone Encounter (Signed)
Letter has been printed and given to patient, nothing further needed.

## 2018-09-15 NOTE — Progress Notes (Signed)
CARDIOLOGY OFFICE NOTE  Date:  09/15/2018    [name] Date of Birth: 09-26-1956 Medical Record #[id number]  PCP:  Susy Frizzle, MD  Cardiologist:  Johnsie Cancel    Chief Complaint  Patient presents with  . Coronary Artery Disease    Follow up visit - seen for Dr. Johnsie Cancel    History of Present Illness: [name] is a 62 y.o. male who presents today for a follow up visit. Seen for Dr. Johnsie Cancel.  He has a history of history of CAD s/p PCI of RCA 2005, PCI of Cx 2007, CABG x 4 in 2014, PCI of OM 03/2018, post-op atrial fib (on amiodarone briefly), OSA on CPAP, hypertension, hyperlipidemia, obesity, GERD, CKD stage III, ongoing tobacco abuse & statin intolerance.   Last seen by Dr. Johnsie Cancel in January of 2019.   Saw Melina Copa, PA back in July. This was for a post-hospital follow-up.  He had been admitted 03/2018 following recent outpatient OV with exertional chest pain. He was loaded with Plavix as outpatient and underwent left heart cath on 04/19/18 which revealed an occluded SVG to circumflex obtuse marginal branch.He had high-grade obtuse marginal branch in-stent restenosis of old stent and a DES was placed to open that stented area with good result. DAPT for minimum of 12 months recommended. He was doing well at follow up with no further chest pain. He is seeing the pharmacist for his lipids.   Comes in today. Here alone.  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 BP there at the [facility] was elevated - he admits he was aggravated due to a long wait experienced. BP ok here today. No chest pain. Breathing is fine. Not dizzy or lightheaded. No syncope noted. He continues to smoke - he is smoking about a pack a day - not ready to stop. Has done prior lung screening CT scan. Overall, he has no concerns.   Past Medical History:  Diagnosis Date  . CKD (chronic kidney disease) stage 3, GFR 30-59 ml/min (HCC)   . COLONIC POLYPS, HX OF   . CORONARY  ARTERY DISEASE    a. s/p stent to RCA 2005. b. Stent to Cx 2007. c.  CABG X4 2014; d. LHC 04/19/18 occluded SVG-Circ, DES to in-stent restenosis of circ.   . Diverticulosis   . DIVERTICULOSIS, COLON   . DYSPHAGIA UNSPECIFIED    intermittent  . GERD   . HEMORRHOIDS   . HYPERLIPIDEMIA   . HYPERTENSION   . HYPERTRIGLYCERIDEMIA   . OBESITY   . Postoperative atrial fibrillation ([place]) 2014  . SLEEP APNEA    on CPAP  . Statin intolerance   . Tobacco abuse     Past Surgical History:  Procedure Laterality Date  . ACHILLES TENDON REPAIR    . BACK SURGERY     lower back  . CARDIAC CATHETERIZATION    . CORONARY ARTERY BYPASS GRAFT  08/18/2012   Procedure: CORONARY ARTERY BYPASS GRAFTING (CABG);  Surgeon: Gaye Pollack, MD;  Location: [place];  Service: Open Heart Surgery;  Laterality: N/A;  CABG x four;  using left internal mammary artery and right leg greater saphenous vein harvested endoscopically  . CORONARY STENT INTERVENTION N/A 04/19/2018   Procedure: CORONARY STENT INTERVENTION;  Surgeon: Lorretta Harp, MD;  Location: [place] [facility];  Service: Cardiovascular;  Laterality: N/A;  . KNEE ARTHROSCOPY     RIGHT  . LEFT HEART CATH AND CORS/GRAFTS ANGIOGRAPHY  N/A 04/19/2018   Procedure: LEFT HEART CATH AND CORS/GRAFTS ANGIOGRAPHY;  Surgeon: Lorretta Harp, MD;  Location: [place] [facility];  Service: Cardiovascular;  Laterality: N/A;  . LUMBAR LAMINECTOMY/DECOMPRESSION MICRODISCECTOMY N/A 12/05/2015   Procedure: LUMBAR 2-3, LUMBAR 3-4 DECOMPRESSION ;  Surgeon: Phylliss Bob, MD;  Location: [place];  Service: Orthopedics;  Laterality: N/A;  . Right long finger extensor tendon repair       Medications: Current Meds  Medication Sig  . aspirin EC 81 MG tablet Take 1 tablet (81 mg total) by mouth daily.  . clopidogrel (PLAVIX) 75 MG tablet Take 1 tablet (75 mg total) by mouth daily.  [name] losartan-hydrochlorothiazide (HYZAAR) 100-12.5 MG tablet TAKE 1 TABLET BY MOUTH ONCE DAILY  .  nitroGLYCERIN (NITROSTAT) 0.4 MG SL tablet Place 1 tablet (0.4 mg total) under the tongue every 5 (five) minutes as needed for chest pain.  . pantoprazole (PROTONIX) 40 MG tablet Take 1 tablet (40 mg total) by mouth daily.  . rosuvastatin (CRESTOR) 5 MG tablet Take 1 tablet (5 mg total) by mouth daily.     Allergies: Allergies  Allergen Reactions  . Statins Other (See Comments)    Bone and muscle pain  . Hydrocodone Nausea And Vomiting    Social History: The patient  reports that he has been smoking cigarettes. He has a 64.50 pack-year smoking history. He has never used smokeless tobacco. He reports that he drinks alcohol. He reports that he does not use drugs.   Family History: The patient's family history includes Cancer in his mother; Heart disease in his brother.   Review of Systems: Please see the history of present illness.   Otherwise, the review of systems is positive for none.   All other systems are reviewed and negative.   Physical Exam: VS:  BP 132/80 (BP Location: Left Arm, Patient Position: Sitting, Cuff Size: Normal)   Pulse 74   Ht 5\' 9"  (1.753 m)   Wt 214 lb (97.1 kg)   SpO2 97%   BMI 31.60 kg/m  .  BMI Body mass index is 31.6 kg/m.  Wt Readings from Last 3 Encounters:  09/15/18 214 lb (97.1 kg)  05/21/18 211 lb 6 oz (95.9 kg)  05/07/18 208 lb (94.3 kg)    General: Alert and in no acute distress.   HEENT: Normal.  Neck: Supple, no JVD, carotid bruits, or masses noted.  Cardiac: Regular rate and rhythm. No murmurs, rubs, or gallops. No edema.  Respiratory:  Lungs are decreased but with normal work of breathing.  GI: Soft and nontender.  MS: No deformity or atrophy. Gait and ROM intact.  Skin: Warm and dry. Color is normal.  Neuro:  Strength and sensation are intact and no gross focal deficits noted.  Psych: Alert, appropriate and with normal affect.   LABORATORY DATA:  EKG:  EKG is ordered today. This shows NSR and is normal.   Lab Results    Component Value Date   WBC 7.2 04/20/2018   HGB 13.6 04/20/2018   HCT 41.2 04/20/2018   PLT 157 04/20/2018   GLUCOSE 96 05/07/2018   CHOL 145 08/02/2018   TRIG 151 (H) 08/02/2018   HDL 44 08/02/2018   LDLDIRECT 77.2 07/10/2009   LDLCALC 71 08/02/2018   ALT 15 08/02/2018   AST 19 08/02/2018   NA 141 05/07/2018   K 4.3 05/07/2018   CL 103 05/07/2018   CREATININE 1.46 (H) 05/07/2018   BUN 22 05/07/2018   CO2 21 05/07/2018  INR 1.08 03/10/2017   HGBA1C 5.5 08/16/2012     BNP (last 3 results) No results for input(s): BNP in the last 8760 hours.  ProBNP (last 3 results) No results for input(s): PROBNP in the last 8760 hours.   Other Studies Reviewed Today:  CORONARY STENT INTERVENTION 03/2018  LEFT HEART CATH AND CORS/GRAFTS ANGIOGRAPHY  Conclusion     Mid RCA to Dist RCA lesion is 100% stenosed.  Ost LAD to Prox LAD lesion is 100% stenosed.  Prox RCA to Mid RCA lesion is 75% stenosed.  Prox Cx to Mid Cx lesion is 95% stenosed.  Origin lesion is 100% stenosed.  A stent was successfully placed.  Post intervention, there is a 0% residual stenosis.  LV end diastolic pressure is mildly elevated.  The left ventricular ejection fraction is 50-55% by visual estimate.  The left ventricular systolic function is normal.        Assessment/Plan:  1. CAD with multiple cath/PCI/CABG and subsequent PCI - last cath in June - this revealed an occluded SVG to circumflex obtuse marginal branch.He had high-grade obtuse marginal branch in-stent restenosis of old stent and a DES was placed to open that stented area with good result. He remains on chronic DAPT - I suspect this will be long term. He has no active symptoms. His cardiovascular status is stable - he has no active symptoms and may continue to work in his current role.   2. HTN - BP here today is fine - no changes made.   3. HLD - on statin - followed in the lipid clinic - labs from October noted  4. CKD -  last lab noted.   5. Tobacco abuse - not ready to stop - total cessation encouraged.    Current medicines are reviewed with the patient today.  The patient does not have concerns regarding medicines other than what has been noted above.  The following changes have been made:  See above.  Labs/ tests ordered today include:   No orders of the defined types were placed in this encounter.    Disposition:   FU with Dr. Johnsie Cancel as planned.   Patient is agreeable to this plan and will call if any problems develop in the interim.   SignedTruitt Merle, NP  09/15/2018 9:25 AM  [facility] [address] [place], [place]  [address] Phone: [phone] Fax: [phone]

## 2018-10-27 DIAGNOSIS — U071 COVID-19: Secondary | ICD-10-CM

## 2018-10-27 HISTORY — DX: COVID-19: U07.1

## 2018-11-15 NOTE — Progress Notes (Signed)
CARDIOLOGY OFFICE NOTE  Date:  11/18/2018    [name] Date of Birth: 1956/03/21 Medical Record #[id number]  PCP:  Susy Frizzle, MD  Cardiologist:  Andrez Grime chief complaint on file.   History of Present Illness: [name] is a 63 y.o. male who presents today for a follow up visit. Seen for Dr. Johnsie Cancel.  He has a history of history of CAD s/p PCI of RCA 2005, PCI of Cx 2007, CABG x 4 in 2014, PCI of OM 03/2018, post-op atrial fib (on amiodarone briefly), OSA on CPAP, hypertension, hyperlipidemia, obesity, GERD, CKD stage III, ongoing tobacco abuse & statin intolerance.   Saw Melina Copa, PA back in July. This was for a post-hospital follow-up.  He had been admitted 03/2018 following recent outpatient OV with exertional chest pain. He was loaded with Plavix as outpatient and underwent left heart cath on 04/19/18 which revealed an occluded SVG to circumflex obtuse marginal branch.He had high-grade obtuse marginal branch in-stent restenosis of old stent and a DES was placed to open that stented area with good result. DAPT for minimum of 12 months recommended. He was doing well at follow up with no further chest pain. He is seeing the pharmacist for his lipids.   Muscular pain in shoulders   Past Medical History:  Diagnosis Date  . CKD (chronic kidney disease) stage 3, GFR 30-59 ml/min (HCC)   . COLONIC POLYPS, HX OF   . CORONARY ARTERY DISEASE    a. s/p stent to RCA 2005. b. Stent to Cx 2007. c.  CABG X4 2014; d. LHC 04/19/18 occluded SVG-Circ, DES to in-stent restenosis of circ.   . Diverticulosis   . DIVERTICULOSIS, COLON   . DYSPHAGIA UNSPECIFIED    intermittent  . GERD   . HEMORRHOIDS   . HYPERLIPIDEMIA   . HYPERTENSION   . HYPERTRIGLYCERIDEMIA   . OBESITY   . Postoperative atrial fibrillation ([place]) 2014  . SLEEP APNEA    on CPAP  . Statin intolerance   . Tobacco abuse     Past Surgical History:  Procedure Laterality Date  . ACHILLES TENDON  REPAIR    . BACK SURGERY     lower back  . CARDIAC CATHETERIZATION    . CORONARY ARTERY BYPASS GRAFT  08/18/2012   Procedure: CORONARY ARTERY BYPASS GRAFTING (CABG);  Surgeon: Gaye Pollack, MD;  Location: [facility];  Service: Open Heart Surgery;  Laterality: N/A;  CABG x four;  using left internal mammary artery and right leg greater saphenous vein harvested endoscopically  . CORONARY STENT INTERVENTION N/A 04/19/2018   Procedure: CORONARY STENT INTERVENTION;  Surgeon: Lorretta Harp, MD;  Location: [facility];  Service: Cardiovascular;  Laterality: N/A;  . KNEE ARTHROSCOPY     RIGHT  . LEFT HEART CATH AND CORS/GRAFTS ANGIOGRAPHY N/A 04/19/2018   Procedure: LEFT HEART CATH AND CORS/GRAFTS ANGIOGRAPHY;  Surgeon: Lorretta Harp, MD;  Location: [facility];  Service: Cardiovascular;  Laterality: N/A;  . LUMBAR LAMINECTOMY/DECOMPRESSION MICRODISCECTOMY N/A 12/05/2015   Procedure: LUMBAR 2-3, LUMBAR 3-4 DECOMPRESSION ;  Surgeon: Phylliss Bob, MD;  Location: [facility];  Service: Orthopedics;  Laterality: N/A;  . Right long finger extensor tendon repair       Medications: Current Meds  Medication Sig  . aspirin EC 81 MG tablet Take 1 tablet (81 mg total) by mouth daily.  . clopidogrel (PLAVIX) 75 MG tablet Take 1 tablet (75 mg total)  by mouth daily.  [name] losartan-hydrochlorothiazide (HYZAAR) 100-12.5 MG tablet TAKE 1 TABLET BY MOUTH ONCE DAILY  . nitroGLYCERIN (NITROSTAT) 0.4 MG SL tablet Place 1 tablet (0.4 mg total) under the tongue every 5 (five) minutes as needed for chest pain.  . pantoprazole (PROTONIX) 40 MG tablet Take 1 tablet (40 mg total) by mouth daily.  . rosuvastatin (CRESTOR) 5 MG tablet Take 1 tablet (5 mg total) by mouth daily.     Allergies: Allergies  Allergen Reactions  . Statins Other (See Comments)    Bone and muscle pain  . Hydrocodone Nausea And Vomiting    Social History: The patient  reports that he has been smoking cigarettes. He has a 64.50  pack-year smoking history. He has never used smokeless tobacco. He reports current alcohol use. He reports that he does not use drugs.   Family History: The patient's family history includes Cancer in his mother; Heart disease in his brother.   Review of Systems: Please see the history of present illness.   Otherwise, the review of systems is positive for none.   All other systems are reviewed and negative.   Physical Exam: VS:  BP (!) 158/80   Pulse 81   Ht 5\' 9"  (1.753 m)   Wt 219 lb 12.8 oz (99.7 kg)   SpO2 97%   BMI 32.46 kg/m  .  BMI Body mass index is 32.46 kg/m.  Wt Readings from Last 3 Encounters:  11/18/18 219 lb 12.8 oz (99.7 kg)  09/15/18 214 lb (97.1 kg)  05/21/18 211 lb 6 oz (95.9 kg)    Affect appropriate Healthy:  appears stated age 7: normal Neck supple with no adenopathy JVP normal no bruits no thyromegaly Lungs clear with no wheezing and good diaphragmatic motion Heart:  S1/S2 no murmur, no rub, gallop or click PMI normal Abdomen: benighn, BS positve, no tenderness, no AAA no bruit.  No HSM or HJR Distal pulses intact with no bruits No edema Neuro non-focal Skin warm and dry No muscular weakness    LABORATORY DATA:  EKG:  EKG is ordered today. This shows NSR and is normal.   Lab Results  Component Value Date   WBC 7.2 04/20/2018   HGB 13.6 04/20/2018   HCT 41.2 04/20/2018   PLT 157 04/20/2018   GLUCOSE 96 05/07/2018   CHOL 145 08/02/2018   TRIG 151 (H) 08/02/2018   HDL 44 08/02/2018   LDLDIRECT 77.2 07/10/2009   LDLCALC 71 08/02/2018   ALT 15 08/02/2018   AST 19 08/02/2018   NA 141 05/07/2018   K 4.3 05/07/2018   CL 103 05/07/2018   CREATININE 1.46 (H) 05/07/2018   BUN 22 05/07/2018   CO2 21 05/07/2018   INR 1.08 03/10/2017   HGBA1C 5.5 08/16/2012     BNP (last 3 results) No results for input(s): BNP in the last 8760 hours.  ProBNP (last 3 results) No results for input(s): PROBNP in the last 8760 hours.   Other  Studies Reviewed Today:  CORONARY STENT INTERVENTION 03/2018  LEFT HEART CATH AND CORS/GRAFTS ANGIOGRAPHY  Conclusion     Mid RCA to Dist RCA lesion is 100% stenosed.  Ost LAD to Prox LAD lesion is 100% stenosed.  Prox RCA to Mid RCA lesion is 75% stenosed.  Prox Cx to Mid Cx lesion is 95% stenosed.  Origin lesion is 100% stenosed.  A stent was successfully placed.  Post intervention, there is a 0% residual stenosis.  LV end diastolic pressure is  mildly elevated.  The left ventricular ejection fraction is 50-55% by visual estimate.  The left ventricular systolic function is normal.        Assessment/Plan:  1. CAD with multiple cath/PCI/CABG and subsequent PCI - last cath in June -  With occluded SVG OM and restenting Of circumflex OM due to native ISR Life long DAT. Continue current medication Good anginal relief   2. HTN:  Well controlled.  Continue current medications and low sodium Dash type diet.    3. HLD - on statin  F/u lipid clinic LDL 71 on Crestor no need for PSK 9 currently   4. CKD - baseline Cr around 1.46 fu nephrology avoid dehydration and nephrotoxic agents   5. Tobacco abuse - recalcitrant to stopping Lung cancer screening negative 11/17/17 should Have f/u exam ordered    Current medicines are reviewed with the patient today.  The patient does not have concerns regarding medicines other than what has been noted above.  The following changes have been made:  See above.  Labs/ tests ordered today include:  Lung cancer screening CT labs  No orders of the defined types were placed in this encounter.    Disposition:   FU with me in 6 months   Patient is agreeable to this plan and will call if any problems develop in the interim.   Signed: Jenkins Rouge, MD  11/18/2018 8:07 AM  [facility] [address] [place], [place]  [address] Phone: [phone] Fax: [phone]

## 2018-11-18 ENCOUNTER — Encounter: Payer: Self-pay | Admitting: Cardiovascular Disease

## 2018-11-18 ENCOUNTER — Ambulatory Visit: Payer: BC Managed Care – PPO | Admitting: Cardiovascular Disease

## 2018-11-18 VITALS — BP 158/80 | HR 81 | Ht 69.0 in | Wt 219.8 lb

## 2018-11-18 DIAGNOSIS — I10 Essential (primary) hypertension: Secondary | ICD-10-CM

## 2018-11-18 DIAGNOSIS — E782 Mixed hyperlipidemia: Secondary | ICD-10-CM

## 2018-11-18 DIAGNOSIS — I2510 Atherosclerotic heart disease of native coronary artery without angina pectoris: Secondary | ICD-10-CM | POA: Diagnosis not present

## 2018-11-18 DIAGNOSIS — Z87891 Personal history of nicotine dependence: Secondary | ICD-10-CM

## 2018-11-18 DIAGNOSIS — N183 Chronic kidney disease, stage 3 unspecified: Secondary | ICD-10-CM

## 2018-11-18 DIAGNOSIS — Z720 Tobacco use: Secondary | ICD-10-CM

## 2018-11-18 MED ORDER — PANTOPRAZOLE SODIUM 40 MG PO TBEC: 40.0000 mg | DELAYED_RELEASE_TABLET | Freq: Every day | ORAL | 3 refills | Status: DC

## 2018-11-18 MED ORDER — CLOPIDOGREL BISULFATE 75 MG PO TABS: 75.0000 mg | ORAL_TABLET | Freq: Every day | ORAL | 3 refills | Status: DC

## 2018-11-18 MED ORDER — LOSARTAN POTASSIUM-HCTZ 100-12.5 MG PO TABS: 1.0000 | ORAL_TABLET | Freq: Every day | ORAL | 3 refills | Status: DC

## 2018-11-18 MED ORDER — ROSUVASTATIN CALCIUM 5 MG PO TABS: 5.0000 mg | ORAL_TABLET | Freq: Every day | ORAL | 3 refills | Status: DC

## 2018-11-18 NOTE — Patient Instructions (Addendum)
Medication Instructions:   If you need a refill on your cardiac medications before your next appointment, please call your pharmacy.   Lab work: Your physician recommends that you have lab work today- CMET, Lipid panel, CBC, HgbA1c, PSA  If you have labs (blood work) drawn today and your tests are completely normal, you will receive your results only by: [name] MyChart Message (if you have MyChart) OR . A paper copy in the mail If you have any lab test that is abnormal or we need to change your treatment, we will call you to review the results.  Testing/Procedures: Non-Cardiac CT scanning, Lung Cancer Screening, (CAT scanning), is a noninvasive, special x-ray that produces cross-sectional images of the body using x-rays and a computer. CT scans help physicians diagnose and treat medical conditions. For some CT exams, a contrast material is used to enhance visibility in the area of the body being studied. CT scans provide greater clarity and reveal more details than regular x-ray exams.  Follow-Up: At [facility], you and your health needs are our priority.  As part of our continuing mission to provide you with exceptional heart care, we have created designated Provider Care Teams.  These Care Teams include your primary Cardiologist (physician) and Advanced Practice Providers (APPs -  Physician Assistants and Nurse Practitioners) who all work together to provide you with the care you need, when you need it. You will need a follow up appointment in 6 months.  Please call our office 2 months in advance to schedule this appointment.  You may see Jenkins Rouge, MD or one of the following Advanced Practice Providers on your designated Care Team:   Truitt Merle, NP Cecilie Kicks, NP . Kathyrn Drown, NP

## 2018-11-19 LAB — PSA: Prostate Specific Ag, Serum: 1.8 ng/mL (ref 0.0–4.0)

## 2018-11-19 LAB — CBC WITH DIFFERENTIAL/PLATELET
BASOS: 1 %
Basophils Absolute: 0.1 10*3/uL (ref 0.0–0.2)
EOS (ABSOLUTE): 0.5 10*3/uL — ABNORMAL HIGH (ref 0.0–0.4)
EOS: 8 %
HEMATOCRIT: 42.7 % (ref 37.5–51.0)
HEMOGLOBIN: 14.8 g/dL (ref 13.0–17.7)
IMMATURE GRANULOCYTES: 0 %
Immature Grans (Abs): 0 10*3/uL (ref 0.0–0.1)
LYMPHS: 20 %
Lymphocytes Absolute: 1.3 10*3/uL (ref 0.7–3.1)
MCH: 30.4 pg (ref 26.6–33.0)
MCHC: 34.7 g/dL (ref 31.5–35.7)
MCV: 88 fL (ref 79–97)
MONOCYTES: 8 %
MONOS ABS: 0.6 10*3/uL (ref 0.1–0.9)
NEUTROS PCT: 63 %
Neutrophils Absolute: 4.2 10*3/uL (ref 1.4–7.0)
Platelets: 210 10*3/uL (ref 150–450)
RBC: 4.87 x10E6/uL (ref 4.14–5.80)
RDW: 11.9 % (ref 11.6–15.4)
WBC: 6.7 10*3/uL (ref 3.4–10.8)

## 2018-11-19 LAB — LIPID PANEL
CHOL/HDL RATIO: 3 ratio (ref 0.0–5.0)
Cholesterol, Total: 136 mg/dL (ref 100–199)
HDL: 45 mg/dL (ref >39)
LDL CALC: 67 mg/dL (ref 0–99)
TRIGLYCERIDES: 121 mg/dL (ref 0–149)
VLDL Cholesterol Cal: 24 mg/dL (ref 5–40)

## 2018-11-19 LAB — HEMOGLOBIN A1C
Est. average glucose Bld gHb Est-mCnc: 111 mg/dL
HEMOGLOBIN A1C: 5.5 % (ref 4.8–5.6)

## 2018-11-19 LAB — COMPREHENSIVE METABOLIC PANEL
A/G RATIO: 2.4 — AB (ref 1.2–2.2)
ALT: 18 IU/L (ref 0–44)
AST: 18 IU/L (ref 0–40)
Albumin: 4.7 g/dL (ref 3.8–4.8)
Alkaline Phosphatase: 54 IU/L (ref 39–117)
BUN/Creatinine Ratio: 13 (ref 10–24)
BUN: 18 mg/dL (ref 8–27)
Bilirubin Total: 0.5 mg/dL (ref 0.0–1.2)
CO2: 21 mmol/L (ref 20–29)
CREATININE: 1.39 mg/dL — AB (ref 0.76–1.27)
Calcium: 9.6 mg/dL (ref 8.6–10.2)
Chloride: 97 mmol/L (ref 96–106)
GFR calc non Af Amer: 53 mL/min/{1.73_m2} — ABNORMAL LOW (ref >59)
GFR, EST AFRICAN AMERICAN: 61 mL/min/{1.73_m2} (ref >59)
GLOBULIN, TOTAL: 2 g/dL (ref 1.5–4.5)
Glucose: 97 mg/dL (ref 65–99)
Potassium: 4.3 mmol/L (ref 3.5–5.2)
SODIUM: 134 mmol/L (ref 134–144)
Total Protein: 6.7 g/dL (ref 6.0–8.5)

## 2018-12-02 ENCOUNTER — Ambulatory Visit: Payer: BC Managed Care – PPO

## 2018-12-07 ENCOUNTER — Ambulatory Visit (INDEPENDENT_AMBULATORY_CARE_PROVIDER_SITE_OTHER)
Admission: RE | Admit: 2018-12-07 | Discharge: 2018-12-07 | Disposition: A | Discharge status: Home / Self Care | Payer: BC Managed Care – PPO | Source: Ambulatory Visit | Attending: Cardiovascular Disease | Admitting: Cardiovascular Disease

## 2018-12-07 DIAGNOSIS — Z87891 Personal history of nicotine dependence: Secondary | ICD-10-CM

## 2018-12-07 DIAGNOSIS — Z720 Tobacco use: Secondary | ICD-10-CM

## 2019-05-24 ENCOUNTER — Telehealth: Payer: Self-pay | Admitting: Nurse Practitioner

## 2019-05-24 NOTE — Telephone Encounter (Signed)

## 2019-05-24 NOTE — Progress Notes (Signed)
CARDIOLOGY OFFICE NOTE  Date:  05/25/2019    [name] Date of Birth: 12-14-1955 Medical Record #[id number]  PCP:  Susy Frizzle, MD  Cardiologist:  Gillian Shields    Chief Complaint  Patient presents with  . Follow-up    Seen for Dr. Johnsie Cancel    History of Present Illness: [name] is a 63 y.o. male who presents today for a follow up visit. Seen for Dr. Johnsie Cancel.  He has a history of history ofCAD s/p PCI of RCA 2005, PCI of Cx 2007, CABG x 4 in 2014, PCI of OM 03/2018, post-op atrial fib (on amiodarone briefly), OSA on CPAP, hypertension, hyperlipidemia, obesity, GERD, CKD stage III, ongoing tobacco abuse & statin intolerance.   He was admitted in June of 2019 following outpatient OV with exertional chest pain. He was loaded with Plavix asoutpatientand underwent left heart cath on 04/19/18 which revealed an occluded SVG to circumflex obtuse marginal branch.He had high-grade obtuse marginal branch in-stent restenosis of old stent and a DES was placed to open that stented area with good result. DAPT for minimum of 12 months recommended. Referred to the lipid clinic. Continued to smoke.   Last seen by Dr. Johnsie Cancel in January of 2020. I last saw him in November of 2019.   The patient does not have symptoms concerning for COVID-19 infection (fever, chills, cough, or new shortness of breath).   Comes in today. Here alone. He feels like he has been doing ok. Not fasting today - had sausage on a bun for breakfast. Continues to smoke. Does not check BP at home. Probably gets too much salt. Has been out working in the heat. Still with [employer] system. No chest pain but then endorsed some tightness last week - he felt like it was indigestion - took aspirin with no relief - took TUMs and felt better. No NTG. Has not recurred. Admits to eating lots of junk prior to this spell. Has not recurred.   Past Medical History:  Diagnosis Date  . CKD (chronic kidney  disease) stage 3, GFR 30-59 ml/min (HCC)   . COLONIC POLYPS, HX OF   . CORONARY ARTERY DISEASE    a. s/p stent to RCA 2005. b. Stent to Cx 2007. c.  CABG X4 2014; d. LHC 04/19/18 occluded SVG-Circ, DES to in-stent restenosis of circ.   . Diverticulosis   . DIVERTICULOSIS, COLON   . DYSPHAGIA UNSPECIFIED    intermittent  . GERD   . HEMORRHOIDS   . HYPERLIPIDEMIA   . HYPERTENSION   . HYPERTRIGLYCERIDEMIA   . OBESITY   . Postoperative atrial fibrillation ([place]) 2014  . SLEEP APNEA    on CPAP  . Statin intolerance   . Tobacco abuse     Past Surgical History:  Procedure Laterality Date  . ACHILLES TENDON REPAIR    . BACK SURGERY     lower back  . CARDIAC CATHETERIZATION    . CORONARY ARTERY BYPASS GRAFT  08/18/2012   Procedure: CORONARY ARTERY BYPASS GRAFTING (CABG);  Surgeon: Gaye Pollack, MD;  Location: [facility];  Service: Open Heart Surgery;  Laterality: N/A;  CABG x four;  using left internal mammary artery and right leg greater saphenous vein harvested endoscopically  . CORONARY STENT INTERVENTION N/A 04/19/2018   Procedure: CORONARY STENT INTERVENTION;  Surgeon: Lorretta Harp, MD;  Location: [facility];  Service: Cardiovascular;  Laterality: N/A;  . KNEE ARTHROSCOPY  RIGHT  . LEFT HEART CATH AND CORS/GRAFTS ANGIOGRAPHY N/A 04/19/2018   Procedure: LEFT HEART CATH AND CORS/GRAFTS ANGIOGRAPHY;  Surgeon: Lorretta Harp, MD;  Location: [facility];  Service: Cardiovascular;  Laterality: N/A;  . LUMBAR LAMINECTOMY/DECOMPRESSION MICRODISCECTOMY N/A 12/05/2015   Procedure: LUMBAR 2-3, LUMBAR 3-4 DECOMPRESSION ;  Surgeon: Phylliss Bob, MD;  Location: [facility];  Service: Orthopedics;  Laterality: N/A;  . Right long finger extensor tendon repair       Medications: Current Meds  Medication Sig  . aspirin EC 81 MG tablet Take 1 tablet (81 mg total) by mouth daily.  . clopidogrel (PLAVIX) 75 MG tablet Take 1 tablet (75 mg total) by mouth daily.  [name]  losartan-hydrochlorothiazide (HYZAAR) 100-12.5 MG tablet Take 1 tablet by mouth daily.  . nitroGLYCERIN (NITROSTAT) 0.4 MG SL tablet Place 1 tablet (0.4 mg total) under the tongue every 5 (five) minutes as needed for chest pain.  . pantoprazole (PROTONIX) 40 MG tablet Take 1 tablet (40 mg total) by mouth daily.  . rosuvastatin (CRESTOR) 5 MG tablet Take 1 tablet (5 mg total) by mouth daily.     Allergies: Allergies  Allergen Reactions  . Statins Other (See Comments)    Bone and muscle pain  . Hydrocodone Nausea And Vomiting    Social History: The patient  reports that he has been smoking cigarettes. He has a 64.50 pack-year smoking history. He has never used smokeless tobacco. He reports current alcohol use. He reports that he does not use drugs.   Family History: The patient's family history includes Cancer in his mother; Heart disease in his brother.   Review of Systems: Please see the history of present illness.   All other systems are reviewed and negative.   Physical Exam: VS:  BP 140/80   Pulse 74   Ht 5\' 9"  (1.753 m)   Wt 217 lb 6.4 oz (98.6 kg)   SpO2 98%   BMI 32.10 kg/m  .  BMI Body mass index is 32.1 kg/m.  Wt Readings from Last 3 Encounters:  05/25/19 217 lb 6.4 oz (98.6 kg)  11/18/18 219 lb 12.8 oz (99.7 kg)  09/15/18 214 lb (97.1 kg)   BP recheck by me is 130/80  General: Pleasant. Well developed, well nourished and in no acute distress. Quite tanned.  HEENT: Normal.  Neck: Supple, no JVD, carotid bruits, or masses noted.  Cardiac: Regular rate and rhythm. No murmurs, rubs, or gallops. No edema.  Respiratory:  Lungs are fairly clear to auscultation bilaterally with normal work of breathing.  GI: Soft and nontender.  MS: No deformity or atrophy. Gait and ROM intact.  Skin: Warm and dry. Color is normal.  Neuro:  Strength and sensation are intact and no gross focal deficits noted.  Psych: Alert, appropriate and with normal affect.   LABORATORY DATA:   EKG:  EKG is ordered today. This shows NSR - HR is 73. Unchanged.   Lab Results  Component Value Date   WBC 6.7 11/18/2018   HGB 14.8 11/18/2018   HCT 42.7 11/18/2018   PLT 210 11/18/2018   GLUCOSE 97 11/18/2018   CHOL 136 11/18/2018   TRIG 121 11/18/2018   HDL 45 11/18/2018   LDLDIRECT 77.2 07/10/2009   LDLCALC 67 11/18/2018   ALT 18 11/18/2018   AST 18 11/18/2018   NA 134 11/18/2018   K 4.3 11/18/2018   CL 97 11/18/2018   CREATININE 1.39 (H) 11/18/2018   BUN 18 11/18/2018  CO2 21 11/18/2018   INR 1.08 03/10/2017   HGBA1C 5.5 11/18/2018     BNP (last 3 results) No results for input(s): BNP in the last 8760 hours.  ProBNP (last 3 results) No results for input(s): PROBNP in the last 8760 hours.   Other Studies Reviewed Today:  CORONARY STENT INTERVENTION 03/2018  LEFT HEART CATH AND CORS/GRAFTS ANGIOGRAPHY  Conclusion     Mid RCA to Dist RCA lesion is 100% stenosed.  Ost LAD to Prox LAD lesion is 100% stenosed.  Prox RCA to Mid RCA lesion is 75% stenosed.  Prox Cx to Mid Cx lesion is 95% stenosed.  Origin lesion is 100% stenosed.  A stent was successfully placed.  Post intervention, there is a 0% residual stenosis.  LV end diastolic pressure is mildly elevated.  The left ventricular ejection fraction is 50-55% by visual estimate.  The left ventricular systolic function is normal.       Assessment/Plan:  1. CAD with multiple cath/PCI/CABG and subsequent PCI - last cath in June 2019 - this revealed an occluded SVG to circumflex obtuse marginal branch.He had high-grade obtuse marginal branch in-stent restenosis of old stent and a DES was placed to open that stented area with good result. He remains on chronic DAPT - this is to be lifelong. Overall felt to be doing ok. One episode of chest tightness last week - checking EKG today - this is ok - needs CV risk factor modification.   2. HTN - recheck by me is ok. No changes made today.   3.  HLD - on statin - needs labs - will do later this week.   4. CKD - lab later this week.   5. Tobacco abuse - he is not ready to stop - total cessation still  encouraged. Had negative lung cancer screening in February of 2020 - this was stable - to be repeated in one year.   6. COVID-19 Education: The signs and symptoms of COVID-19 were discussed with the patient and how to seek care for testing (follow up with PCP or arrange E-visit).  The importance of social distancing, staying at home, hand hygiene and wearing a mask when out in public were discussed today.  Current medicines are reviewed with the patient today.  The patient does not have concerns regarding medicines other than what has been noted above.  The following changes have been made:  See above.  Labs/ tests ordered today include:    Orders Placed This Encounter  Procedures  . Basic metabolic panel  . CBC  . Hepatic function panel  . Lipid panel  . EKG 12-Lead     Disposition:   FU with Dr. Johnsie Cancel in 6 months and me in one year. He is to call and let us know if chest pain returns.    Patient is agreeable to this plan and will call if any problems develop in the interim.   SignedTruitt Merle, NP  05/25/2019 9:23 AM  [facility] [address] [place], [place]  [address] Phone: [phone] Fax: [phone]

## 2019-05-25 ENCOUNTER — Encounter: Payer: Self-pay | Admitting: Nurse Practitioner

## 2019-05-25 ENCOUNTER — Other Ambulatory Visit: Payer: Self-pay

## 2019-05-25 ENCOUNTER — Ambulatory Visit: Payer: BC Managed Care – PPO | Admitting: Nurse Practitioner

## 2019-05-25 VITALS — BP 140/80 | HR 74 | Ht 69.0 in | Wt 217.4 lb

## 2019-05-25 DIAGNOSIS — E785 Hyperlipidemia, unspecified: Secondary | ICD-10-CM | POA: Diagnosis not present

## 2019-05-25 DIAGNOSIS — N183 Chronic kidney disease, stage 3 unspecified: Secondary | ICD-10-CM

## 2019-05-25 DIAGNOSIS — Z7189 Other specified counseling: Secondary | ICD-10-CM

## 2019-05-25 DIAGNOSIS — I10 Essential (primary) hypertension: Secondary | ICD-10-CM

## 2019-05-25 DIAGNOSIS — I2510 Atherosclerotic heart disease of native coronary artery without angina pectoris: Secondary | ICD-10-CM | POA: Diagnosis not present

## 2019-05-25 NOTE — Patient Instructions (Addendum)
After Visit Summary:  We will be checking the following labs today - NONE  Fasting labs on Friday - BMET, CBC, HPF and Lipids   Medication Instructions:    Continue with your current medicines.    If you need a refill on your cardiac medications before your next appointment, please call your pharmacy.     Testing/Procedures To Be Arranged:  N/A  Follow-Up:   See Dr. Johnsie Cancel in 6 months  See me in one year    At [facility], you and your health needs are our priority.  As part of our continuing mission to provide you with exceptional heart care, we have created designated Provider Care Teams.  These Care Teams include your primary Cardiologist (physician) and Advanced Practice Providers (APPs -  Physician Assistants and Nurse Practitioners) who all work together to provide you with the care you need, when you need it.  Special Instructions:  . Stay safe, stay home, wash your hands for at least 20 seconds and wear a mask when out in public.  . It was good to talk with you today.  . Let us know if you have any more chest tightness - we will need to see you back.    Call the [facility] office at [phone] if you have any questions, problems or concerns.

## 2019-05-27 ENCOUNTER — Other Ambulatory Visit: Payer: Self-pay

## 2019-05-27 ENCOUNTER — Other Ambulatory Visit: Payer: BC Managed Care – PPO | Admitting: *Deleted

## 2019-05-27 DIAGNOSIS — I2510 Atherosclerotic heart disease of native coronary artery without angina pectoris: Secondary | ICD-10-CM

## 2019-05-27 DIAGNOSIS — E785 Hyperlipidemia, unspecified: Secondary | ICD-10-CM

## 2019-05-27 DIAGNOSIS — I10 Essential (primary) hypertension: Secondary | ICD-10-CM

## 2019-05-27 DIAGNOSIS — N183 Chronic kidney disease, stage 3 unspecified: Secondary | ICD-10-CM

## 2019-05-27 LAB — CBC
Hematocrit: 44.4 % (ref 37.5–51.0)
Hemoglobin: 14.6 g/dL (ref 13.0–17.7)
MCH: 30.5 pg (ref 26.6–33.0)
MCHC: 32.9 g/dL (ref 31.5–35.7)
MCV: 93 fL (ref 79–97)
Platelets: 188 10*3/uL (ref 150–450)
RBC: 4.79 x10E6/uL (ref 4.14–5.80)
RDW: 12.2 % (ref 11.6–15.4)
WBC: 7.2 10*3/uL (ref 3.4–10.8)

## 2019-05-27 LAB — HEPATIC FUNCTION PANEL
ALT: 17 IU/L (ref 0–44)
AST: 18 IU/L (ref 0–40)
Albumin: 4.6 g/dL (ref 3.8–4.8)
Alkaline Phosphatase: 53 IU/L (ref 39–117)
Bilirubin Total: 0.5 mg/dL (ref 0.0–1.2)
Bilirubin, Direct: 0.16 mg/dL (ref 0.00–0.40)
Total Protein: 6.6 g/dL (ref 6.0–8.5)

## 2019-05-27 LAB — LIPID PANEL
Chol/HDL Ratio: 2.9 ratio (ref 0.0–5.0)
Cholesterol, Total: 131 mg/dL (ref 100–199)
HDL: 45 mg/dL (ref >39)
LDL Calculated: 63 mg/dL (ref 0–99)
Triglycerides: 117 mg/dL (ref 0–149)
VLDL Cholesterol Cal: 23 mg/dL (ref 5–40)

## 2019-05-27 LAB — BASIC METABOLIC PANEL
BUN/Creatinine Ratio: 16 (ref 10–24)
BUN: 22 mg/dL (ref 8–27)
CO2: 20 mmol/L (ref 20–29)
Calcium: 9.5 mg/dL (ref 8.6–10.2)
Chloride: 102 mmol/L (ref 96–106)
Creatinine, Ser: 1.41 mg/dL — ABNORMAL HIGH (ref 0.76–1.27)
GFR calc Af Amer: 60 mL/min/{1.73_m2} (ref >59)
GFR calc non Af Amer: 52 mL/min/{1.73_m2} — ABNORMAL LOW (ref >59)
Glucose: 95 mg/dL (ref 65–99)
Potassium: 4 mmol/L (ref 3.5–5.2)
Sodium: 138 mmol/L (ref 134–144)

## 2019-06-27 ENCOUNTER — Other Ambulatory Visit: Payer: Self-pay

## 2019-06-28 ENCOUNTER — Encounter: Payer: Self-pay | Admitting: Family Medicine

## 2019-06-28 ENCOUNTER — Ambulatory Visit (INDEPENDENT_AMBULATORY_CARE_PROVIDER_SITE_OTHER): Payer: BC Managed Care – PPO | Admitting: Family Medicine

## 2019-06-28 VITALS — BP 146/74 | HR 70 | Temp 98.9°F | Resp 14 | Ht 69.0 in | Wt 219.0 lb

## 2019-06-28 DIAGNOSIS — L989 Disorder of the skin and subcutaneous tissue, unspecified: Secondary | ICD-10-CM | POA: Diagnosis not present

## 2019-06-28 DIAGNOSIS — Z0001 Encounter for general adult medical examination with abnormal findings: Secondary | ICD-10-CM

## 2019-06-28 DIAGNOSIS — Z720 Tobacco use: Secondary | ICD-10-CM

## 2019-06-28 DIAGNOSIS — E781 Pure hyperglyceridemia: Secondary | ICD-10-CM

## 2019-06-28 DIAGNOSIS — Z23 Encounter for immunization: Secondary | ICD-10-CM | POA: Diagnosis not present

## 2019-06-28 DIAGNOSIS — N183 Chronic kidney disease, stage 3 unspecified: Secondary | ICD-10-CM

## 2019-06-28 DIAGNOSIS — Z0000 Encounter for general adult medical examination without abnormal findings: Secondary | ICD-10-CM

## 2019-06-28 DIAGNOSIS — I10 Essential (primary) hypertension: Secondary | ICD-10-CM

## 2019-06-28 DIAGNOSIS — R0989 Other specified symptoms and signs involving the circulatory and respiratory systems: Secondary | ICD-10-CM

## 2019-06-28 MED ORDER — SILDENAFIL CITRATE 100 MG PO TABS: 50.0000 mg | ORAL_TABLET | Freq: Every day | ORAL | 11 refills | Status: DC | PRN

## 2019-06-28 NOTE — Addendum Note (Signed)
Addended by: Shary Decamp B on: 06/28/2019 10:00 AM   Modules accepted: Orders

## 2019-06-28 NOTE — Progress Notes (Signed)
Subjective:    Patient ID: [name], male    DOB: 1956-02-26, 63 y.o.   MRN: [id number]  HPI Patient is a very pleasant 63 year old Caucasian male here today for a physical exam.  Past medical history is significant for coronary artery disease.  Is also significant for tobacco abuse.  He continues to smoke.  Last year the patient had a stent placed.  He is on dual antiplatelet therapy with aspirin and Plavix.  He follows up regularly with Dr. Jenkins Rouge.  His office is been doing an excellent job also performing preventative care for this patient.  For instance he had a CT scan of his chest in February to evaluate/screen for lung cancer which did show 4 mm pulmonary nodules that were stable.  A repeat CT scan was recommended in 1 year.  They also checked his PSA earlier this year in January and his PSA was 1.8.  His last colonoscopy was less than 2 years ago per the patient's report and he was given a recommendation of follow-up in 5 years.  He is due today for a flu shot as well as Pneumovax 23.  Otherwise his cancer screening is up-to-date.  He does report some erectile dysfunction.  However libido is still good.  He is not using nitroglycerin.  He has never tried Viagra.  There is also a lesion on the dorsum of his left forearm.  Is roughly 1 cm in diameter.  It is a pink based nodular papule with hard white scale on top.  It has been growing steadily.  His wife is concerned.  Otherwise he is doing well with no concerns.  Most recent lab work was obtained in July which I have included below and reviewed with him that does show stage III chronic kidney disease but is otherwise excellent: Lab on 05/27/2019  Component Date Value Ref Range Status  . Glucose 05/27/2019 95  65 - 99 mg/dL Final  . BUN 05/27/2019 22  8 - 27 mg/dL Final  . Creatinine, Ser 05/27/2019 1.41* 0.76 - 1.27 mg/dL Final  . GFR calc non Af Amer 05/27/2019 52* >59 mL/min/1.73 Final  . GFR calc Af Amer 05/27/2019 60  >59  mL/min/1.73 Final  . BUN/Creatinine Ratio 05/27/2019 16  10 - 24 Final  . Sodium 05/27/2019 138  134 - 144 mmol/L Final  . Potassium 05/27/2019 4.0  3.5 - 5.2 mmol/L Final  . Chloride 05/27/2019 102  96 - 106 mmol/L Final  . CO2 05/27/2019 20  20 - 29 mmol/L Final  . Calcium 05/27/2019 9.5  8.6 - 10.2 mg/dL Final  . WBC 05/27/2019 7.2  3.4 - 10.8 x10E3/uL Final  . RBC 05/27/2019 4.79  4.14 - 5.80 x10E6/uL Final  . Hemoglobin 05/27/2019 14.6  13.0 - 17.7 g/dL Final  . Hematocrit 05/27/2019 44.4  37.5 - 51.0 % Final  . MCV 05/27/2019 93  79 - 97 fL Final  . MCH 05/27/2019 30.5  26.6 - 33.0 pg Final  . MCHC 05/27/2019 32.9  31.5 - 35.7 g/dL Final  . RDW 05/27/2019 12.2  11.6 - 15.4 % Final  . Platelets 05/27/2019 188  150 - 450 x10E3/uL Final  . Total Protein 05/27/2019 6.6  6.0 - 8.5 g/dL Final  . Albumin 05/27/2019 4.6  3.8 - 4.8 g/dL Final  . Bilirubin Total 05/27/2019 0.5  0.0 - 1.2 mg/dL Final  . Bilirubin, Direct 05/27/2019 0.16  0.00 - 0.40 mg/dL Final  . Alkaline Phosphatase 05/27/2019 53  39 - 117 IU/L Final  . AST 05/27/2019 18  0 - 40 IU/L Final  . ALT 05/27/2019 17  0 - 44 IU/L Final  . Cholesterol, Total 05/27/2019 131  100 - 199 mg/dL Final  . Triglycerides 05/27/2019 117  0 - 149 mg/dL Final  . HDL 05/27/2019 45  >39 mg/dL Final  . VLDL Cholesterol Cal 05/27/2019 23  5 - 40 mg/dL Final  . LDL Calculated 05/27/2019 63  0 - 99 mg/dL Final  . Chol/HDL Ratio 05/27/2019 2.9  0.0 - 5.0 ratio Final   Comment:                                   T. Chol/HDL Ratio                                             Men  Women                               1/2 Avg.Risk  3.4    3.3                                   Avg.Risk  5.0    4.4                                2X Avg.Risk  9.6    7.1                                3X Avg.Risk 23.4   11.0    Past Medical History:  Diagnosis Date  . CKD (chronic kidney disease) stage 3, GFR 30-59 ml/min (HCC)   . COLONIC POLYPS, HX OF   . CORONARY  ARTERY DISEASE    a. s/p stent to RCA 2005. b. Stent to Cx 2007. c.  CABG X4 2014; d. LHC 04/19/18 occluded SVG-Circ, DES to in-stent restenosis of circ.   . Diverticulosis   . DIVERTICULOSIS, COLON   . DYSPHAGIA UNSPECIFIED    intermittent  . GERD   . HEMORRHOIDS   . HYPERLIPIDEMIA   . HYPERTENSION   . HYPERTRIGLYCERIDEMIA   . OBESITY   . Postoperative atrial fibrillation ([place]) 2014  . SLEEP APNEA    on CPAP  . Statin intolerance   . Tobacco abuse    Past Surgical History:  Procedure Laterality Date  . ACHILLES TENDON REPAIR    . BACK SURGERY     lower back  . CARDIAC CATHETERIZATION    . CORONARY ARTERY BYPASS GRAFT  08/18/2012   Procedure: CORONARY ARTERY BYPASS GRAFTING (CABG);  Surgeon: Gaye Pollack, MD;  Location: [facility];  Service: Open Heart Surgery;  Laterality: N/A;  CABG x four;  using left internal mammary artery and right leg greater saphenous vein harvested endoscopically  . CORONARY STENT INTERVENTION N/A 04/19/2018   Procedure: CORONARY STENT INTERVENTION;  Surgeon: Lorretta Harp, MD;  Location: [facility];  Service: Cardiovascular;  Laterality: N/A;  . KNEE ARTHROSCOPY     RIGHT  . LEFT HEART CATH  AND CORS/GRAFTS ANGIOGRAPHY N/A 04/19/2018   Procedure: LEFT HEART CATH AND CORS/GRAFTS ANGIOGRAPHY;  Surgeon: Lorretta Harp, MD;  Location: [facility];  Service: Cardiovascular;  Laterality: N/A;  . LUMBAR LAMINECTOMY/DECOMPRESSION MICRODISCECTOMY N/A 12/05/2015   Procedure: LUMBAR 2-3, LUMBAR 3-4 DECOMPRESSION ;  Surgeon: Phylliss Bob, MD;  Location: [facility];  Service: Orthopedics;  Laterality: N/A;  . Right long finger extensor tendon repair     Current Outpatient Medications on File Prior to Visit  Medication Sig Dispense Refill  . aspirin EC 81 MG tablet Take 1 tablet (81 mg total) by mouth daily. 90 tablet 3  . clopidogrel (PLAVIX) 75 MG tablet Take 1 tablet (75 mg total) by mouth daily. 90 tablet 3  . losartan-hydrochlorothiazide (HYZAAR)  100-12.5 MG tablet Take 1 tablet by mouth daily. 90 tablet 3  . nitroGLYCERIN (NITROSTAT) 0.4 MG SL tablet Place 1 tablet (0.4 mg total) under the tongue every 5 (five) minutes as needed for chest pain. 75 tablet 1  . pantoprazole (PROTONIX) 40 MG tablet Take 1 tablet (40 mg total) by mouth daily. 90 tablet 3  . rosuvastatin (CRESTOR) 5 MG tablet Take 1 tablet (5 mg total) by mouth daily. 60 tablet 3   No current facility-administered medications on file prior to visit.    Allergies  Allergen Reactions  . Statins Other (See Comments)    Bone and muscle pain  . Hydrocodone Nausea And Vomiting   Social History   Socioeconomic History  . Marital status: Married    Spouse name: Not on file  . Number of children: Not on file  . Years of education: Not on file  . Highest education level: Not on file  Occupational History  . Not on file  Social Needs  . Financial resource strain: Not on file  . Food insecurity    Worry: Not on file    Inability: Not on file  . Transportation needs    Medical: Not on file    Non-medical: Not on file  Tobacco Use  . Smoking status: Current Every Day Smoker    Packs/day: 1.50    Years: 43.00    Pack years: 64.50    Types: Cigarettes  . Smokeless tobacco: Never Used  . Tobacco comment: Patient attempting to quit pt down to 1 ppd  Substance and Sexual Activity  . Alcohol use: Yes    Comment: 1-2 drink occasionally  . Drug use: No  . Sexual activity: Not on file  Lifestyle  . Physical activity    Days per week: Not on file    Minutes per session: Not on file  . Stress: Not on file  Relationships  . Social Herbalist on phone: Not on file    Gets together: Not on file    Attends religious service: Not on file    Active member of club or organization: Not on file    Attends meetings of clubs or organizations: Not on file    Relationship status: Not on file  . Intimate partner violence    Fear of current or ex partner: Not on file     Emotionally abused: Not on file    Physically abused: Not on file    Forced sexual activity: Not on file  Other Topics Concern  . Not on file  Social History Narrative  . Not on file   Family History  Problem Relation Age of Onset  . Cancer Mother   . Heart  disease Brother       Review of Systems  All other systems reviewed and are negative.      Objective:   Physical Exam Vitals signs reviewed.  Constitutional:      General: He is not in acute distress.    Appearance: Normal appearance. He is normal weight. He is not ill-appearing, toxic-appearing or diaphoretic.  HENT:     Head: Normocephalic and atraumatic.     Right Ear: Tympanic membrane, ear canal and external ear normal. There is no impacted cerumen.     Left Ear: Tympanic membrane, ear canal and external ear normal. There is no impacted cerumen.     Nose: Nose normal. No congestion or rhinorrhea.     Mouth/Throat:     Mouth: Mucous membranes are moist.     Pharynx: No oropharyngeal exudate or posterior oropharyngeal erythema.  Eyes:     General:        Right eye: No discharge.        Left eye: No discharge.     Conjunctiva/sclera: Conjunctivae normal.     Pupils: Pupils are equal, round, and reactive to light.  Neck:     Musculoskeletal: Normal range of motion and neck supple. No neck rigidity or muscular tenderness.     Vascular: Carotid bruit present.  Cardiovascular:     Rate and Rhythm: Tachycardia present. Rhythm irregular.     Pulses: Normal pulses.     Heart sounds: Normal heart sounds. No murmur. No friction rub. No gallop.   Pulmonary:     Effort: Pulmonary effort is normal. No respiratory distress.     Breath sounds: Normal breath sounds. No stridor. No wheezing, rhonchi or rales.  Chest:     Chest wall: No tenderness.  Abdominal:     General: Abdomen is flat. Bowel sounds are normal. There is no distension.     Tenderness: There is no abdominal tenderness. There is no right CVA tenderness,  left CVA tenderness or guarding.  Musculoskeletal:        General: No swelling, tenderness, deformity or signs of injury.     Right lower leg: No edema.     Left lower leg: No edema.  Lymphadenopathy:     Cervical: No cervical adenopathy.  Skin:    General: Skin is warm.     Capillary Refill: Capillary refill takes less than 2 seconds.     Coloration: Skin is not jaundiced or pale.     Findings: Lesion present. No bruising, erythema or rash.       Neurological:     General: No focal deficit present.     Mental Status: He is alert and oriented to person, place, and time. Mental status is at baseline.     Cranial Nerves: No cranial nerve deficit.     Sensory: No sensory deficit.     Motor: No weakness.     Coordination: Coordination normal.     Gait: Gait normal.     Deep Tendon Reflexes: Reflexes normal.  Psychiatric:        Mood and Affect: Mood normal.        Behavior: Behavior normal.        Thought Content: Thought content normal.        Judgment: Judgment normal.           Assessment & Plan:  Skin lesion of left arm - Plan: Pathology Report ([facility])  General medical exam  CKD (chronic kidney disease) stage 3, GFR  30-59 ml/min (HCC)  Tobacco abuse  HYPERTRIGLYCERIDEMIA  Essential hypertension  Right carotid bruit - Plan: US Carotid Duplex Bilateral  Patient's cancer screening is up-to-date.  His PSA was normal in January.  His colonoscopy is up-to-date.  His cardiologist is already ordered a CT scan of the chest to screen for lung cancer.  Patient received Pneumovax 23 today.  I would recommend Prevnar 13 next year.  He defers his flu shot because he gets them free at work.  He is due for a tetanus shot but he declines this today.  I did give the patient a prescription for Viagra 50 to 100 mg p.o. daily as needed sexual activity.  I was very clear with the patient he should not take this with nitroglycerin.  He states that he has not taken nitroglycerin in more  than a year.  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  The area was prepped with Betadine and anesthetized with 0.1% lidocaine with epinephrine.  A shave biopsy was performed of the lesion in its entirety and this was sent to pathology in a labeled container.  Hemostasis was achieved with Drysol.  I reviewed his lab work from July at the cardiologist office including a CBC, CMP, fasting lipid panel all which was within normal limits except for mild stage III chronic kidney disease.  I recommended that he drink plenty of fluids at work to stay hydrated and avoid NSAIDs.  Patient has a faint right carotid bruit.  I will schedule carotid Dopplers to evaluate this given his history.

## 2019-06-30 LAB — PATHOLOGY REPORT

## 2019-06-30 LAB — TISSUE SPECIMEN

## 2019-07-15 ENCOUNTER — Other Ambulatory Visit: Payer: Self-pay | Admitting: Family Medicine

## 2019-07-15 DIAGNOSIS — R0989 Other specified symptoms and signs involving the circulatory and respiratory systems: Secondary | ICD-10-CM

## 2019-07-21 ENCOUNTER — Ambulatory Visit
Admission: RE | Admit: 2019-07-21 | Discharge: 2019-07-21 | Disposition: A | Discharge status: Home / Self Care | Payer: BC Managed Care – PPO | Source: Ambulatory Visit | Attending: Family Medicine | Admitting: Family Medicine

## 2019-07-21 DIAGNOSIS — R0989 Other specified symptoms and signs involving the circulatory and respiratory systems: Secondary | ICD-10-CM

## 2019-07-22 ENCOUNTER — Encounter: Payer: Self-pay | Admitting: Family Medicine

## 2019-07-22 DIAGNOSIS — I6502 Occlusion and stenosis of left vertebral artery: Secondary | ICD-10-CM | POA: Insufficient documentation

## 2019-07-22 DIAGNOSIS — I6522 Occlusion and stenosis of left carotid artery: Secondary | ICD-10-CM | POA: Insufficient documentation

## 2019-07-26 ENCOUNTER — Other Ambulatory Visit: Payer: Self-pay | Admitting: Cardiovascular Disease

## 2019-07-26 DIAGNOSIS — N183 Chronic kidney disease, stage 3 unspecified: Secondary | ICD-10-CM

## 2019-07-26 DIAGNOSIS — I2510 Atherosclerotic heart disease of native coronary artery without angina pectoris: Secondary | ICD-10-CM

## 2019-07-26 DIAGNOSIS — E782 Mixed hyperlipidemia: Secondary | ICD-10-CM

## 2019-07-26 DIAGNOSIS — I10 Essential (primary) hypertension: Secondary | ICD-10-CM

## 2019-09-02 ENCOUNTER — Telehealth: Payer: Self-pay | Admitting: Cardiovascular Disease

## 2019-09-02 NOTE — Telephone Encounter (Signed)
Informed patient that Dr. Johnsie Cancel is fine to write note. Patient will pick up note on Tuesday.

## 2019-09-02 NOTE — Telephone Encounter (Signed)
Ok to write note to drive

## 2019-09-02 NOTE — Telephone Encounter (Signed)
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

## 2019-09-02 NOTE — Telephone Encounter (Signed)
Patient needs note that states he is able to drive. Can mail to patient. Will forward to Dr. Johnsie Cancel.

## 2019-09-12 ENCOUNTER — Telehealth: Payer: Self-pay | Admitting: Pulmonary Disease

## 2019-09-12 NOTE — Telephone Encounter (Signed)
cpap download printed and placed up front for pickup. Pt aware. Nothing further needed at this time- will close encounter.

## 2019-11-08 ENCOUNTER — Other Ambulatory Visit: Payer: Self-pay | Admitting: Cardiovascular Disease

## 2019-11-08 DIAGNOSIS — I10 Essential (primary) hypertension: Secondary | ICD-10-CM

## 2019-11-08 DIAGNOSIS — I2510 Atherosclerotic heart disease of native coronary artery without angina pectoris: Secondary | ICD-10-CM

## 2019-11-08 DIAGNOSIS — E782 Mixed hyperlipidemia: Secondary | ICD-10-CM

## 2019-11-08 DIAGNOSIS — N183 Chronic kidney disease, stage 3 unspecified: Secondary | ICD-10-CM

## 2019-11-17 ENCOUNTER — Telehealth: Payer: Self-pay | Admitting: Cardiovascular Disease

## 2019-11-17 NOTE — Telephone Encounter (Signed)
Did not need this encounter °

## 2019-11-24 NOTE — Progress Notes (Deleted)
CARDIOLOGY OFFICE NOTE  Date:  11/24/2019    [name] Date of Birth: December 28, 1955 Medical Record [id number]  PCP:  Susy Frizzle, MD  Cardiologist:  Gillian Shields    No chief complaint on file.   History of Present Illness: [name] is a 64 y.o. male who presents today for a follow up visit.    He has a history of history ofCAD s/p PCI of RCA 2005, PCI of Cx 2007, CABG x 4 in 2014, PCI of OM 03/2018, post-op atrial fib (on amiodarone briefly), OSA on CPAP, hypertension, hyperlipidemia, obesity, GERD, CKD stage III, ongoing tobacco abuse     He was admitted in June of 2019 following outpatient OV with exertional chest pain. He was loaded with Plavix asoutpatientand underwent left heart cath on 04/19/18 which revealed an occluded SVG to circumflex obtuse marginal branch.He had high-grade obtuse marginal branch in-stent restenosis of old stent and a DES was placed to open that stented area with good result. DAPT for minimum of 12 months recommended. Referred to the lipid clinic. Continued to smoke.    Still working with [employer] system. No angina or TIA symptoms    ***  Past Medical History:  Diagnosis Date  . CKD (chronic kidney disease) stage 3, GFR 30-59 ml/min (HCC)   . COLONIC POLYPS, HX OF   . CORONARY ARTERY DISEASE    a. s/p stent to RCA 2005. b. Stent to Cx 2007. c.  CABG X4 2014; d. LHC 04/19/18 occluded SVG-Circ, DES to in-stent restenosis of circ.   . Diverticulosis   . DIVERTICULOSIS, COLON   . DYSPHAGIA UNSPECIFIED    intermittent  . GERD   . HEMORRHOIDS   . HYPERLIPIDEMIA   . HYPERTENSION   . HYPERTRIGLYCERIDEMIA   . Left carotid artery occlusion   . OBESITY   . Postoperative atrial fibrillation ([place]) 2014  . SLEEP APNEA    on CPAP  . Statin intolerance   . Tobacco abuse   . Vertebral artery occlusion, left     Past Surgical History:  Procedure Laterality Date  . ACHILLES TENDON REPAIR    . BACK SURGERY     lower  back  . CARDIAC CATHETERIZATION    . CORONARY ARTERY BYPASS GRAFT  08/18/2012   Procedure: CORONARY ARTERY BYPASS GRAFTING (CABG);  Surgeon: Gaye Pollack, MD;  Location: [facility];  Service: Open Heart Surgery;  Laterality: N/A;  CABG x four;  using left internal mammary artery and right leg greater saphenous vein harvested endoscopically  . CORONARY STENT INTERVENTION N/A 04/19/2018   Procedure: CORONARY STENT INTERVENTION;  Surgeon: Lorretta Harp, MD;  Location: [facility] [place] [facility];  Service: Cardiovascular;  Laterality: N/A;  . KNEE ARTHROSCOPY     RIGHT  . LEFT HEART CATH AND CORS/GRAFTS ANGIOGRAPHY N/A 04/19/2018   Procedure: LEFT HEART CATH AND CORS/GRAFTS ANGIOGRAPHY;  Surgeon: Lorretta Harp, MD;  Location: [facility];  Service: Cardiovascular;  Laterality: N/A;  . LUMBAR LAMINECTOMY/DECOMPRESSION MICRODISCECTOMY N/A 12/05/2015   Procedure: LUMBAR 2-3, LUMBAR 3-4 DECOMPRESSION ;  Surgeon: Phylliss Bob, MD;  Location: [facility];  Service: Orthopedics;  Laterality: N/A;  . Right long finger extensor tendon repair       Medications: No outpatient medications have been marked as taking for the 11/30/19 encounter (Appointment) with Josue Hector, MD.     Allergies: Allergies  Allergen Reactions  . Statins Other (See Comments)    Bone  and muscle pain  . Hydrocodone Nausea And Vomiting    Social History: The patient  reports that he has been smoking cigarettes. He has a 64.50 pack-year smoking history. He has never used smokeless tobacco. He reports current alcohol use. He reports that he does not use drugs.   Family History: The patient's family history includes Cancer in his mother; Heart disease in his brother.   Review of Systems: Please see the history of present illness.   All other systems are reviewed and negative.   Physical Exam: VS:  There were no vitals taken for this visit. [name]  BMI There is no height or weight on file to calculate BMI.  Wt Readings from Last  3 Encounters:  06/28/19 219 lb (99.3 kg)  05/25/19 217 lb 6.4 oz (98.6 kg)  11/18/18 219 lb 12.8 oz (99.7 kg)   Affect appropriate Healthy:  appears stated age 65: normal Neck supple with no adenopathy JVP normal no bruits no thyromegaly Lungs clear with no wheezing and good diaphragmatic motion Heart:  S1/S2 no murmur, no rub, gallop or click PMI normal Abdomen: benighn, BS positve, no tenderness, no AAA no bruit.  No HSM or HJR Distal pulses intact with no bruits No edema Neuro non-focal Skin warm and dry No muscular weakness    LABORATORY DATA:  EKG:   05/25/19 NSR rate 73 normal   Lab Results  Component Value Date   WBC 7.2 05/27/2019   HGB 14.6 05/27/2019   HCT 44.4 05/27/2019   PLT 188 05/27/2019   GLUCOSE 95 05/27/2019   CHOL 131 05/27/2019   TRIG 117 05/27/2019   HDL 45 05/27/2019   LDLDIRECT 77.2 07/10/2009   LDLCALC 63 05/27/2019   ALT 17 05/27/2019   AST 18 05/27/2019   NA 138 05/27/2019   K 4.0 05/27/2019   CL 102 05/27/2019   CREATININE 1.41 (H) 05/27/2019   BUN 22 05/27/2019   CO2 20 05/27/2019   INR 1.08 03/10/2017   HGBA1C 5.5 11/18/2018     BNP (last 3 results) No results for input(s): BNP in the last 8760 hours.  ProBNP (last 3 results) No results for input(s): PROBNP in the last 8760 hours.   Other Studies Reviewed Today:  CORONARY STENT INTERVENTION 03/2018  LEFT HEART CATH AND CORS/GRAFTS ANGIOGRAPHY  Conclusion     Mid RCA to Dist RCA lesion is 100% stenosed.  Ost LAD to Prox LAD lesion is 100% stenosed.  Prox RCA to Mid RCA lesion is 75% stenosed.  Prox Cx to Mid Cx lesion is 95% stenosed.  Origin lesion is 100% stenosed.  A stent was successfully placed.  Post intervention, there is a 0% residual stenosis.  LV end diastolic pressure is mildly elevated.  The left ventricular ejection fraction is 50-55% by visual estimate.  The left ventricular systolic function is normal.     Carotid 07/21/19    IMPRESSION: 1. Chronic occlusion of the left internal carotid artery. 2. Mild heterogeneous atherosclerotic plaque in the proximal right internal carotid artery which is also highly tortuous. Expected compensatory elevated peak systolic velocities due to the contralateral occlusion and tortuosity limits the utility of the peak systolic velocity on the right side. Subjectively, the stenosis is less than 50%. 3. Chronic occlusion of the left vertebral artery. 4. The right vertebral artery is patent with normal antegrade flow.  Assessment/Plan:  1. CAD with multiple cath/PCI/CABG and subsequent PCI - last cath in June 2019 - this revealed an occluded SVG to  circumflex obtuse marginal branch.He had high-grade obtuse marginal branch in-stent restenosis of old stent and a DES was placed to open that stented area with good result. He remains on chronic DAPT - this is to be lifelong. Continue medical Rx .   2. HTN - Well controlled.  Continue current medications and low sodium Dash type diet.     3. HLD - continue statin  LDL at goal 63 05/27/19   4. CKD - stable with Cr 1.41 05/27/19   5. Smoking:  - counseled on cessation to no avail Lung cancer screening CT benign 12/07/18 repeat ordered    6. Carotid Dx:  Duplex [id number] with CTO LICA and vertebral tortuous right ICA without significant obstructive Stenosis visually although velocities elevated 198/46 cm/sec will order repeat September 2021  Current medicines are reviewed with the patient today.  The patient does not have concerns regarding medicines other than what has been noted above. Given compensatory elevation from contralateral occlusion and tortuosity may need CTA to further define plaque if systolic velocity exceeds 27m/sec in ICA   The following changes have been made:  See above.  Labs/ tests ordered today include: Lung cancer screening CT  Carotid duplex September 2021    No orders of the defined types were placed in  this encounter.    Disposition:   FU with me  in 6 months     Patient is agreeable to this plan and will call if any problems develop in the interim.   Signed: Jenkins Rouge, MD  11/24/2019 12:37 PM  [facility] [address] [facility], [place]  [address] Phone: [phone] Fax: [phone]

## 2019-11-30 ENCOUNTER — Encounter: Payer: Self-pay | Admitting: Cardiovascular Disease

## 2019-11-30 ENCOUNTER — Ambulatory Visit: Payer: BC Managed Care – PPO | Admitting: Cardiovascular Disease

## 2019-11-30 ENCOUNTER — Other Ambulatory Visit: Payer: Self-pay

## 2019-11-30 VITALS — BP 126/72 | HR 78 | Ht 69.0 in | Wt 221.0 lb

## 2019-11-30 DIAGNOSIS — I2510 Atherosclerotic heart disease of native coronary artery without angina pectoris: Secondary | ICD-10-CM

## 2019-11-30 DIAGNOSIS — F17200 Nicotine dependence, unspecified, uncomplicated: Secondary | ICD-10-CM

## 2019-11-30 NOTE — Progress Notes (Signed)
CARDIOLOGY OFFICE NOTE  Date:  11/30/2019    [name] Date of Birth: [birth year] Medical Record [id number]  PCP:  Susy Frizzle, MD  Cardiologist:  Gillian Shields    No chief complaint on file.   History of Present Illness: [name] is a 64 y.o. male who presents today for a follow up visit. Seen for Dr. Johnsie Cancel.  He has a history of history ofCAD s/p PCI of RCA 2005, PCI of Cx 2007, CABG x 4 in 2014, PCI of OM 03/2018, post-op atrial fib (on amiodarone briefly), OSA on CPAP, hypertension, hyperlipidemia, obesity, GERD, CKD stage III, ongoing tobacco abuse & statin intolerance.   He was admitted in June of 2019 following outpatient OV with exertional chest pain. He was loaded with Plavix asoutpatientand underwent left heart cath on 04/19/18 which revealed an occluded SVG to circumflex obtuse marginal branch.He had high-grade obtuse marginal branch in-stent restenosis of old stent and a DES was placed to open that stented area with good result. DAPT for minimum of 12 months recommended. Referred to the lipid clinic. Continued to smoke. 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    Past Medical History:  Diagnosis Date  . CKD (chronic kidney disease) stage 3, GFR 30-59 ml/min   . COLONIC POLYPS, HX OF   . CORONARY ARTERY DISEASE    a. s/p stent to RCA 2005. b. Stent to Cx 2007. c.  CABG X4 2014; d. LHC 04/19/18 occluded SVG-Circ, DES to in-stent restenosis of circ.   . Diverticulosis   . DIVERTICULOSIS, COLON   . DYSPHAGIA UNSPECIFIED    intermittent  . GERD   . HEMORRHOIDS   . HYPERLIPIDEMIA   . HYPERTENSION   . HYPERTRIGLYCERIDEMIA   . Left carotid artery occlusion   . OBESITY   . Postoperative atrial  fibrillation ([place]) 2014  . SLEEP APNEA    on CPAP  . Statin intolerance   . Tobacco abuse   . Vertebral artery occlusion, left     Past Surgical History:  Procedure Laterality Date  . ACHILLES TENDON REPAIR    . BACK SURGERY     lower back  . CARDIAC CATHETERIZATION    . CORONARY ARTERY BYPASS GRAFT  08/18/2012   Procedure: CORONARY ARTERY BYPASS GRAFTING (CABG);  Surgeon: Gaye Pollack, MD;  Location: [facility];  Service: Open Heart Surgery;  Laterality: N/A;  CABG x four;  using left internal mammary artery and right leg greater saphenous vein harvested endoscopically  . CORONARY STENT INTERVENTION N/A 04/19/2018   Procedure: CORONARY STENT INTERVENTION;  Surgeon: Lorretta Harp, MD;  Location: [facility];  Service: Cardiovascular;  Laterality: N/A;  . KNEE ARTHROSCOPY     RIGHT  . LEFT HEART CATH AND CORS/GRAFTS ANGIOGRAPHY N/A 04/19/2018   Procedure: LEFT HEART CATH AND CORS/GRAFTS ANGIOGRAPHY;  Surgeon: Lorretta Harp, MD;  Location: [facility];  Service: Cardiovascular;  Laterality: N/A;  . LUMBAR LAMINECTOMY/DECOMPRESSION MICRODISCECTOMY N/A 12/05/2015   Procedure: LUMBAR 2-3, LUMBAR 3-4 DECOMPRESSION ;  Surgeon: Phylliss Bob, MD;  Location: [facility];  Service: Orthopedics;  Laterality: N/A;  . Right long finger extensor tendon repair       Medications: Current Meds  Medication Sig  .  aspirin EC 81 MG tablet Take 1 tablet (81 mg total) by mouth daily.  . clopidogrel (PLAVIX) 75 MG tablet Take 1 tablet (75 mg total) by mouth daily.  [name] losartan-hydrochlorothiazide (HYZAAR) 100-12.5 MG tablet TAKE 1 TABLET BY MOUTH ONCE A DAY  . nitroGLYCERIN (NITROSTAT) 0.4 MG SL tablet Place 1 tablet (0.4 mg total) under the tongue every 5 (five) minutes as needed for chest pain.  . pantoprazole (PROTONIX) 40 MG tablet TAKE 1 TABLET BY MOUTH ONCE A DAY  . rosuvastatin (CRESTOR) 5 MG tablet TAKE 1 TABLET BY MOUTH ONCE A DAY  . sildenafil (VIAGRA) 100 MG tablet Take 0.5-1 tablets  (50-100 mg total) by mouth daily as needed for erectile dysfunction.     Allergies: Allergies  Allergen Reactions  . Statins Other (See Comments)    Bone and muscle pain  . Oxycodone Other (See Comments)  . Hydrocodone Nausea And Vomiting    Social History: The patient  reports that he has been smoking cigarettes. He has a 64.50 pack-year smoking history. He has never used smokeless tobacco. He reports current alcohol use. He reports that he does not use drugs.   Family History: The patient's family history includes Cancer in his mother; Heart disease in his brother.   Review of Systems: Please see the history of present illness.   All other systems are reviewed and negative.   Physical Exam: VS:  BP 126/72   Pulse 78   Ht 5\' 9"  (1.753 m)   Wt 221 lb (100.2 kg)   SpO2 98%   BMI 32.64 kg/m  .  BMI Body mass index is 32.64 kg/m.  Wt Readings from Last 3 Encounters:  11/30/19 221 lb (100.2 kg)  06/28/19 219 lb (99.3 kg)  05/25/19 217 lb 6.4 oz (98.6 kg)   BP recheck by me is 130/80  General: Pleasant. Well developed, well nourished and in no acute distress. Quite tanned.  HEENT: Normal.  Neck: Supple, no JVD, carotid bruits, or masses noted.  Cardiac: Regular rate and rhythm. No murmurs, rubs, or gallops. No edema.  Respiratory:  Lungs are fairly clear to auscultation bilaterally with normal work of breathing.  GI: Soft and nontender.  MS: No deformity or atrophy. Gait and ROM intact.  Skin: Warm and dry. Color is normal.  Neuro:  Strength and sensation are intact and no gross focal deficits noted.  Psych: Alert, appropriate and with normal affect.   LABORATORY DATA:  EKG:  EKG is ordered today. This shows NSR - HR is 73. Unchanged.   Lab Results  Component Value Date   WBC 7.2 05/27/2019   HGB 14.6 05/27/2019   HCT 44.4 05/27/2019   PLT 188 05/27/2019   GLUCOSE 95 05/27/2019   CHOL 131 05/27/2019   TRIG 117 05/27/2019   HDL 45 05/27/2019   LDLDIRECT 77.2  07/10/2009   LDLCALC 63 05/27/2019   ALT 17 05/27/2019   AST 18 05/27/2019   NA 138 05/27/2019   K 4.0 05/27/2019   CL 102 05/27/2019   CREATININE 1.41 (H) 05/27/2019   BUN 22 05/27/2019   CO2 20 05/27/2019   INR 1.08 03/10/2017   HGBA1C 5.5 11/18/2018     BNP (last 3 results) No results for input(s): BNP in the last 8760 hours.  ProBNP (last 3 results) No results for input(s): PROBNP in the last 8760 hours.   Other Studies Reviewed Today:  CORONARY STENT INTERVENTION 03/2018  LEFT HEART CATH AND CORS/GRAFTS ANGIOGRAPHY  Conclusion  Mid RCA to Dist RCA lesion is 100% stenosed.  Ost LAD to Prox LAD lesion is 100% stenosed.  Prox RCA to Mid RCA lesion is 75% stenosed.  Prox Cx to Mid Cx lesion is 95% stenosed.  Origin lesion is 100% stenosed.  A stent was successfully placed.  Post intervention, there is a 0% residual stenosis.  LV end diastolic pressure is mildly elevated.  The left ventricular ejection fraction is 50-55% by visual estimate.  The left ventricular systolic function is normal.       Assessment/Plan:  1. CAD with multiple cath/PCI/CABG and subsequent PCI - last cath in June 2019 - this revealed an occluded SVG to circumflex obtuse marginal branch.He had high-grade obtuse marginal branch in-stent restenosis of old stent and a DES was placed to open that stented area with good result. He remains on chronic DAPT - this is to be lifelong. Overall felt to be doing ok. One episode of chest tightness last week - checking EKG today - this is ok - needs CV risk factor modification.   2. HTN - Well controlled.  Continue current medications and low sodium Dash type diet.     3. HLD - on statin - LDL at goal 63   4. CKD - stable CR 1.4    5. Tobacco abuse - he is not ready to stop - total cessation still  encouraged. Had negative lung cancer screening in February of 2020 - repeat ordered   6. COVID-19 Education: The signs and  symptoms of COVID-19 were discussed with the patient and how to seek care for testing (follow up with PCP or arrange E-visit).  The importance of social distancing, staying at home, hand hygiene and wearing a mask when out in public were discussed today.  Current medicines are reviewed with the patient today.  The patient does not have concerns regarding medicines other than what has been noted above.  The following changes have been made:  See above.  Labs/ tests ordered today include: Lung cancer screening CT    No orders of the defined types were placed in this encounter.    Disposition:   FU with me in 6 months     Patient is agreeable to this plan and will call if any problems develop in the interim.   Signed: Jenkins Rouge, MD  11/30/2019 [facility] [address] [place], [place]  [address] Phone: [phone] Fax: [phone]

## 2019-11-30 NOTE — Patient Instructions (Addendum)
Your physician recommends that you continue on your current medications as directed. Please refer to the Current Medication list given to you today.   LUNG CT  AFTER 12/08/19  Your physician wants you to follow-up in: [place] will receive a reminder letter in the mail two months in advance. If you don't receive a letter, please call our office to schedule the follow-up appointment.

## 2019-12-15 ENCOUNTER — Inpatient Hospital Stay: Admission: RE | Admit: 2019-12-15 | Payer: BC Managed Care – PPO | Source: Ambulatory Visit

## 2019-12-30 ENCOUNTER — Other Ambulatory Visit: Payer: Self-pay

## 2019-12-30 ENCOUNTER — Ambulatory Visit (INDEPENDENT_AMBULATORY_CARE_PROVIDER_SITE_OTHER)
Admission: RE | Admit: 2019-12-30 | Discharge: 2019-12-30 | Disposition: A | Discharge status: Home / Self Care | Payer: BC Managed Care – PPO | Source: Ambulatory Visit | Attending: Cardiovascular Disease | Admitting: Cardiovascular Disease

## 2019-12-30 DIAGNOSIS — Z87891 Personal history of nicotine dependence: Secondary | ICD-10-CM

## 2019-12-30 DIAGNOSIS — F17200 Nicotine dependence, unspecified, uncomplicated: Secondary | ICD-10-CM

## 2020-01-09 ENCOUNTER — Other Ambulatory Visit: Payer: Self-pay

## 2020-01-09 ENCOUNTER — Encounter: Payer: Self-pay | Admitting: Family Medicine

## 2020-01-09 ENCOUNTER — Ambulatory Visit (INDEPENDENT_AMBULATORY_CARE_PROVIDER_SITE_OTHER): Payer: BC Managed Care – PPO | Admitting: Family Medicine

## 2020-01-09 VITALS — BP 110/68 | HR 78 | Temp 97.7°F | Resp 18 | Ht 69.0 in | Wt 221.0 lb

## 2020-01-09 DIAGNOSIS — L723 Sebaceous cyst: Secondary | ICD-10-CM

## 2020-01-09 NOTE — Progress Notes (Signed)
Subjective:    Patient ID: [name], male    DOB: February 17, 1956, 64 y.o.   MRN: [id number]  HPI  Patient reports a painful lesion on his left flank just below the scapula and towards the axillary line.  On examination he has a 2.5 cm elliptical shaped inflamed red tender sebaceous cyst.  There is a central pore with white granular debris which can be expressed with pressure.  Patient states that is been there for more than a week however it has become very painful. Past Medical History:  Diagnosis Date  . CKD (chronic kidney disease) stage 3, GFR 30-59 ml/min   . COLONIC POLYPS, HX OF   . CORONARY ARTERY DISEASE    a. s/p stent to RCA 2005. b. Stent to Cx 2007. c.  CABG X4 2014; d. LHC 04/19/18 occluded SVG-Circ, DES to in-stent restenosis of circ.   . Diverticulosis   . DIVERTICULOSIS, COLON   . DYSPHAGIA UNSPECIFIED    intermittent  . GERD   . HEMORRHOIDS   . HYPERLIPIDEMIA   . HYPERTENSION   . HYPERTRIGLYCERIDEMIA   . Left carotid artery occlusion   . OBESITY   . Postoperative atrial fibrillation ([place]) 2014  . SLEEP APNEA    on CPAP  . Statin intolerance   . Tobacco abuse   . Vertebral artery occlusion, left    Past Surgical History:  Procedure Laterality Date  . ACHILLES TENDON REPAIR    . BACK SURGERY     lower back  . CARDIAC CATHETERIZATION    . CORONARY ARTERY BYPASS GRAFT  08/18/2012   Procedure: CORONARY ARTERY BYPASS GRAFTING (CABG);  Surgeon: Gaye Pollack, MD;  Location: [facility];  Service: Open Heart Surgery;  Laterality: N/A;  CABG x four;  using left internal mammary artery and right leg greater saphenous vein harvested endoscopically  . CORONARY STENT INTERVENTION N/A 04/19/2018   Procedure: CORONARY STENT INTERVENTION;  Surgeon: Lorretta Harp, MD;  Location: [facility];  Service: Cardiovascular;  Laterality: N/A;  . KNEE ARTHROSCOPY     RIGHT  . LEFT HEART CATH AND CORS/GRAFTS ANGIOGRAPHY N/A 04/19/2018   Procedure: LEFT HEART CATH AND  CORS/GRAFTS ANGIOGRAPHY;  Surgeon: Lorretta Harp, MD;  Location: [facility];  Service: Cardiovascular;  Laterality: N/A;  . LUMBAR LAMINECTOMY/DECOMPRESSION MICRODISCECTOMY N/A 12/05/2015   Procedure: LUMBAR 2-3, LUMBAR 3-4 DECOMPRESSION ;  Surgeon: Phylliss Bob, MD;  Location: [facility];  Service: Orthopedics;  Laterality: N/A;  . Right long finger extensor tendon repair     Current Outpatient Medications on File Prior to Visit  Medication Sig Dispense Refill  . aspirin EC 81 MG tablet Take 1 tablet (81 mg total) by mouth daily. 90 tablet 3  . clopidogrel (PLAVIX) 75 MG tablet Take 1 tablet (75 mg total) by mouth daily. 90 tablet 3  . losartan-hydrochlorothiazide (HYZAAR) 100-12.5 MG tablet TAKE 1 TABLET BY MOUTH ONCE A DAY 90 tablet 1  . nitroGLYCERIN (NITROSTAT) 0.4 MG SL tablet Place 1 tablet (0.4 mg total) under the tongue every 5 (five) minutes as needed for chest pain. 75 tablet 1  . pantoprazole (PROTONIX) 40 MG tablet TAKE 1 TABLET BY MOUTH ONCE A DAY 90 tablet 1  . rosuvastatin (CRESTOR) 5 MG tablet TAKE 1 TABLET BY MOUTH ONCE A DAY 90 tablet 2  . sildenafil (VIAGRA) 100 MG tablet Take 0.5-1 tablets (50-100 mg total) by mouth daily as needed for erectile dysfunction. 5 tablet 11   No  current facility-administered medications on file prior to visit.   Allergies  Allergen Reactions  . Statins Other (See Comments)    Bone and muscle pain  . Oxycodone Other (See Comments)  . Hydrocodone Nausea And Vomiting   Social History   Socioeconomic History  . Marital status: Married    Spouse name: Not on file  . Number of children: Not on file  . Years of education: Not on file  . Highest education level: Not on file  Occupational History  . Not on file  Tobacco Use  . Smoking status: Current Every Day Smoker    Packs/day: 1.50    Years: 43.00    Pack years: 64.50    Types: Cigarettes  . Smokeless tobacco: Never Used  . Tobacco comment: Patient attempting to quit pt down  to 1 ppd  Substance and Sexual Activity  . Alcohol use: Yes    Comment: 1-2 drink occasionally  . Drug use: No  . Sexual activity: Not on file  Other Topics Concern  . Not on file  Social History Narrative  . Not on file   Social Determinants of Health   Financial Resource Strain:   . Difficulty of Paying Living Expenses:   Food Insecurity:   . Worried About Charity fundraiser in the Last Year:   . Arboriculturist in the Last Year:   Transportation Needs:   . Film/video editor (Medical):   [name] Lack of Transportation (Non-Medical):   Physical Activity:   . Days of Exercise per Week:   . Minutes of Exercise per Session:   Stress:   . Feeling of Stress :   Social Connections:   . Frequency of Communication with Friends and Family:   . Frequency of Social Gatherings with Friends and Family:   . Attends Religious Services:   . Active Member of Clubs or Organizations:   . Attends Archivist Meetings:   [name] Marital Status:   Intimate Partner Violence:   . Fear of Current or Ex-Partner:   . Emotionally Abused:   [name] Physically Abused:   . Sexually Abused:      Review of Systems  All other systems reviewed and are negative.      Objective:   Physical Exam Vitals reviewed.  Constitutional:      Appearance: Normal appearance.  Cardiovascular:     Rate and Rhythm: Normal rate and regular rhythm.  Pulmonary:     Effort: Pulmonary effort is normal.     Breath sounds: Normal breath sounds.    Neurological:     Mental Status: He is alert.           Assessment & Plan:  Inflamed sebaceous cyst  Lesion was anesthetized with 0.1% lidocaine with epinephrine.  A 1 cm vertical incision was made directly in the center of the lesion.  White granular purulent debris was expressed from the opening.  The wound was then probed with Q-tip soaked in hydrogen peroxide and was then packed with 2 inches of 1/4 inch iodoform gauze.  Patient tolerated procedure well with  no complications.  Packing out in 24 hours.  Wound care was discussed.  Wound should heal by secondary intention over the next 7 days.  Recheck immediately if wound appears infected or complications arise

## 2020-01-11 ENCOUNTER — Other Ambulatory Visit: Payer: Self-pay | Admitting: Cardiovascular Disease

## 2020-01-11 DIAGNOSIS — I2510 Atherosclerotic heart disease of native coronary artery without angina pectoris: Secondary | ICD-10-CM

## 2020-01-11 DIAGNOSIS — E782 Mixed hyperlipidemia: Secondary | ICD-10-CM

## 2020-01-11 DIAGNOSIS — N183 Chronic kidney disease, stage 3 unspecified: Secondary | ICD-10-CM

## 2020-01-11 DIAGNOSIS — I10 Essential (primary) hypertension: Secondary | ICD-10-CM

## 2020-03-05 ENCOUNTER — Other Ambulatory Visit: Payer: Self-pay | Admitting: Cardiovascular Disease

## 2020-03-05 DIAGNOSIS — I10 Essential (primary) hypertension: Secondary | ICD-10-CM

## 2020-03-05 DIAGNOSIS — I2510 Atherosclerotic heart disease of native coronary artery without angina pectoris: Secondary | ICD-10-CM

## 2020-03-05 DIAGNOSIS — N183 Chronic kidney disease, stage 3 unspecified: Secondary | ICD-10-CM

## 2020-03-05 DIAGNOSIS — E782 Mixed hyperlipidemia: Secondary | ICD-10-CM

## 2020-05-07 ENCOUNTER — Other Ambulatory Visit: Payer: Self-pay | Admitting: Cardiovascular Disease

## 2020-05-07 ENCOUNTER — Ambulatory Visit: Payer: BC Managed Care – PPO | Admitting: Family Medicine

## 2020-05-07 ENCOUNTER — Other Ambulatory Visit: Payer: Self-pay

## 2020-05-07 VITALS — BP 120/70 | HR 95 | Temp 97.4°F | Wt 212.0 lb

## 2020-05-07 DIAGNOSIS — E782 Mixed hyperlipidemia: Secondary | ICD-10-CM

## 2020-05-07 DIAGNOSIS — D485 Neoplasm of uncertain behavior of skin: Secondary | ICD-10-CM

## 2020-05-07 DIAGNOSIS — L989 Disorder of the skin and subcutaneous tissue, unspecified: Secondary | ICD-10-CM | POA: Diagnosis not present

## 2020-05-07 DIAGNOSIS — N183 Chronic kidney disease, stage 3 unspecified: Secondary | ICD-10-CM

## 2020-05-07 DIAGNOSIS — I2510 Atherosclerotic heart disease of native coronary artery without angina pectoris: Secondary | ICD-10-CM

## 2020-05-07 DIAGNOSIS — I10 Essential (primary) hypertension: Secondary | ICD-10-CM

## 2020-05-07 NOTE — Progress Notes (Signed)
Subjective:    Patient ID: [name], male    DOB: 1956/04/13, 64 y.o.   MRN: [id number]  HPI Patient has a lesion on the dorsum of his right forearm.  It is on the mid forearm halfway between his hand and his elbow.  Is a 5 mm white domelike papule with telangiectasias.  It is also slightly erythematous.  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  Patient states has been there for months. Past Medical History:  Diagnosis Date  . CKD (chronic kidney disease) stage 3, GFR 30-59 ml/min   . COLONIC POLYPS, HX OF   . CORONARY ARTERY DISEASE    a. s/p stent to RCA 2005. b. Stent to Cx 2007. c.  CABG X4 2014; d. LHC 04/19/18 occluded SVG-Circ, DES to in-stent restenosis of circ.   . Diverticulosis   . DIVERTICULOSIS, COLON   . DYSPHAGIA UNSPECIFIED    intermittent  . GERD   . HEMORRHOIDS   . HYPERLIPIDEMIA   . HYPERTENSION   . HYPERTRIGLYCERIDEMIA   . Left carotid artery occlusion   . OBESITY   . Postoperative atrial fibrillation ([place]) 2014  . SLEEP APNEA    on CPAP  . Statin intolerance   . Tobacco abuse   . Vertebral artery occlusion, left    Past Surgical History:  Procedure Laterality Date  . ACHILLES TENDON REPAIR    . BACK SURGERY     lower back  . CARDIAC CATHETERIZATION    . CORONARY ARTERY BYPASS GRAFT  08/18/2012   Procedure: CORONARY ARTERY BYPASS GRAFTING (CABG);  Surgeon: Gaye Pollack, MD;  Location: [facility];  Service: Open Heart Surgery;  Laterality: N/A;  CABG x four;  using left internal mammary artery and right leg greater saphenous vein harvested endoscopically  . CORONARY STENT INTERVENTION N/A 04/19/2018   Procedure: CORONARY STENT INTERVENTION;  Surgeon: Lorretta Harp, MD;  Location: [facility];  Service: Cardiovascular;  Laterality: N/A;  . KNEE ARTHROSCOPY     RIGHT  . LEFT HEART CATH AND CORS/GRAFTS ANGIOGRAPHY N/A 04/19/2018   Procedure: LEFT HEART CATH AND CORS/GRAFTS ANGIOGRAPHY;  Surgeon: Lorretta Harp, MD;  Location: [facility];  Service: Cardiovascular;  Laterality: N/A;  . LUMBAR LAMINECTOMY/DECOMPRESSION MICRODISCECTOMY N/A 12/05/2015   Procedure: LUMBAR 2-3, LUMBAR 3-4 DECOMPRESSION ;  Surgeon: Phylliss Bob, MD;  Location: [facility];  Service: Orthopedics;  Laterality: N/A;  . Right long finger extensor tendon repair     Current Outpatient Medications on File Prior to Visit  Medication Sig Dispense Refill  . aspirin EC 81 MG tablet Take 1 tablet (81 mg total) by mouth daily. 90 tablet 3  . clopidogrel (PLAVIX) 75 MG tablet TAKE 1 TABLET BY MOUTH ONCE A DAY 90 tablet 3  . losartan-hydrochlorothiazide (HYZAAR) 100-12.5 MG tablet TAKE 1 TABLET BY MOUTH ONCE A DAY 90 tablet 1  . nitroGLYCERIN (NITROSTAT) 0.4 MG SL tablet Place 1 tablet (0.4 mg total) under the tongue every 5 (five) minutes as needed for chest pain. 75 tablet 1  . sildenafil (VIAGRA) 100 MG tablet Take 0.5-1 tablets (50-100 mg total) by mouth daily as needed for erectile dysfunction. 5 tablet 11   No current facility-administered medications on file prior to visit.   Allergies  Allergen Reactions  . Statins Other (See Comments)    Bone and muscle pain  . Oxycodone Other (See Comments)  . Hydrocodone Nausea And Vomiting   Social History   Socioeconomic  History  . Marital status: Married    Spouse name: Not on file  . Number of children: Not on file  . Years of education: Not on file  . Highest education level: Not on file  Occupational History  . Not on file  Tobacco Use  . Smoking status: Current Every Day Smoker    Packs/day: 1.50    Years: 43.00    Pack years: 64.50    Types: Cigarettes  . Smokeless tobacco: Never Used  . Tobacco comment: Patient attempting to quit pt down to 1 ppd  Vaping Use  . Vaping Use: Never used  Substance and Sexual Activity  . Alcohol use: Yes    Comment: 1-2 drink occasionally  . Drug use: No  . Sexual activity: Not on file  Other Topics Concern  . Not on file  Social History Narrative   . Not on file   Social Determinants of Health   Financial Resource Strain:   . Difficulty of Paying Living Expenses:   Food Insecurity:   . Worried About Charity fundraiser in the Last Year:   . Arboriculturist in the Last Year:   Transportation Needs:   . Film/video editor (Medical):   [name] Lack of Transportation (Non-Medical):   Physical Activity:   . Days of Exercise per Week:   . Minutes of Exercise per Session:   Stress:   . Feeling of Stress :   Social Connections:   . Frequency of Communication with Friends and Family:   . Frequency of Social Gatherings with Friends and Family:   . Attends Religious Services:   . Active Member of Clubs or Organizations:   . Attends Archivist Meetings:   [name] Marital Status:   Intimate Partner Violence:   . Fear of Current or Ex-Partner:   . Emotionally Abused:   [name] Physically Abused:   . Sexually Abused:       Review of Systems  All other systems reviewed and are negative.      Objective:   Physical Exam Vitals reviewed.  Constitutional:      Appearance: Normal appearance.  Cardiovascular:     Rate and Rhythm: Normal rate and regular rhythm.     Heart sounds: Normal heart sounds.  Pulmonary:     Effort: Pulmonary effort is normal.     Breath sounds: Normal breath sounds.  Skin:      Neurological:     Mental Status: He is alert.           Assessment & Plan:  Skin lesion of right arm - Plan: Pathology Report ([facility])  I suspect a basal cell carcinoma.  Using sterile technique the area was anesthetized with 0.1% lidocaine with epinephrine.  A shave biopsy was taken of the entire lesion down to the underlying fascia.  Hemostasis was achieved with Drysol and a Band-Aid.  Lesion was sent to pathology in a labeled container.

## 2020-05-09 LAB — TISSUE SPECIMEN

## 2020-05-09 LAB — PATHOLOGY REPORT

## 2020-05-23 ENCOUNTER — Ambulatory Visit: Payer: BC Managed Care – PPO | Admitting: Nurse Practitioner

## 2020-07-15 ENCOUNTER — Other Ambulatory Visit: Payer: Self-pay | Admitting: Physician Assistant

## 2020-07-15 DIAGNOSIS — N183 Chronic kidney disease, stage 3 unspecified: Secondary | ICD-10-CM

## 2020-07-15 DIAGNOSIS — J410 Simple chronic bronchitis: Secondary | ICD-10-CM

## 2020-07-15 DIAGNOSIS — I2510 Atherosclerotic heart disease of native coronary artery without angina pectoris: Secondary | ICD-10-CM

## 2020-07-15 DIAGNOSIS — U071 COVID-19: Secondary | ICD-10-CM

## 2020-07-15 DIAGNOSIS — E669 Obesity, unspecified: Secondary | ICD-10-CM

## 2020-07-15 DIAGNOSIS — I480 Paroxysmal atrial fibrillation: Secondary | ICD-10-CM

## 2020-07-15 NOTE — Progress Notes (Signed)
I connected by phone with [name] on 07/15/2020 at 12:11 PM to discuss the potential use of a new treatment for mild to moderate COVID-19 viral infection in non-hospitalized patients.  This patient is a 64 y.o. male that meets the FDA criteria for Emergency Use Authorization of COVID monoclonal antibody casirivimab/imdevimab.  Has a (+) direct SARS-CoV-2 viral test result  Has mild or moderate COVID-19   Is NOT hospitalized due to COVID-19  Is within 10 days of symptom onset  Has at least one of the high risk factor(s) for progression to severe COVID-19 and/or hospitalization as defined in EUA.  Specific high risk criteria : Older age (>/= 66 yo), BMI > 25, Chronic Kidney Disease (CKD) and Cardiovascular disease or hypertension   I have spoken and communicated the following to the patient or parent/caregiver regarding COVID monoclonal antibody treatment:  1. FDA has authorized the emergency use for the treatment of mild to moderate COVID-19 in adults and pediatric patients with positive results of direct SARS-CoV-2 viral testing who are 57 years of age and older weighing at least 40 kg, and who are at high risk for progressing to severe COVID-19 and/or hospitalization.  2. The significant known and potential risks and benefits of COVID monoclonal antibody, and the extent to which such potential risks and benefits are unknown.  3. Information on available alternative treatments and the risks and benefits of those alternatives, including clinical trials.  4. Patients treated with COVID monoclonal antibody should continue to self-isolate and use infection control measures (e.g., wear mask, isolate, social distance, avoid sharing personal items, clean and disinfect "high touch" surfaces, and frequent handwashing) according to CDC guidelines.   5. The patient or parent/caregiver has the option to accept or refuse COVID monoclonal antibody treatment.  After reviewing this information  with the patient, The patient agreed to proceed with receiving casirivimab\imdevimab infusion and will be provided a copy of the Fact sheet prior to receiving the infusion.  Sx onset 9/14. Set up for infusion on 9/20 @ 8:30am. Directions given to [facility]. Pt is aware that insurance will be charged an infusion fee. Pt is unvaccinated.    Angelena Form 07/15/2020 12:11 PM

## 2020-07-16 ENCOUNTER — Ambulatory Visit (HOSPITAL_COMMUNITY)
Admission: RE | Admit: 2020-07-16 | Discharge: 2020-07-16 | Disposition: A | Discharge status: Home / Self Care | Payer: BC Managed Care – PPO | Source: Ambulatory Visit | Attending: Pulmonary Disease | Admitting: Pulmonary Disease

## 2020-07-16 DIAGNOSIS — N183 Chronic kidney disease, stage 3 unspecified: Secondary | ICD-10-CM | POA: Insufficient documentation

## 2020-07-16 DIAGNOSIS — I480 Paroxysmal atrial fibrillation: Secondary | ICD-10-CM | POA: Diagnosis present

## 2020-07-16 DIAGNOSIS — U071 COVID-19: Secondary | ICD-10-CM | POA: Insufficient documentation

## 2020-07-16 DIAGNOSIS — I2510 Atherosclerotic heart disease of native coronary artery without angina pectoris: Secondary | ICD-10-CM | POA: Insufficient documentation

## 2020-07-16 DIAGNOSIS — E669 Obesity, unspecified: Secondary | ICD-10-CM | POA: Diagnosis present

## 2020-07-16 DIAGNOSIS — J410 Simple chronic bronchitis: Secondary | ICD-10-CM | POA: Insufficient documentation

## 2020-07-16 MED ORDER — METHYLPREDNISOLONE SODIUM SUCC 125 MG IJ SOLR: 125.0000 mg | Freq: Once | INTRAMUSCULAR | Status: DC | PRN

## 2020-07-16 MED ORDER — SODIUM CHLORIDE 0.9 % IV SOLN
1200.0000 mg | Freq: Once | INTRAVENOUS | Status: AC
  Administered 2020-07-16: 1200 mg via INTRAVENOUS

## 2020-07-16 MED ORDER — SODIUM CHLORIDE 0.9 % IV SOLN: INTRAVENOUS | Status: DC | PRN

## 2020-07-16 MED ORDER — FAMOTIDINE IN NACL 20-0.9 MG/50ML-% IV SOLN: 20.0000 mg | Freq: Once | INTRAVENOUS | Status: DC | PRN

## 2020-07-16 MED ORDER — EPINEPHRINE 0.3 MG/0.3ML IJ SOAJ: 0.3000 mg | Freq: Once | INTRAMUSCULAR | Status: DC | PRN

## 2020-07-16 MED ORDER — ALBUTEROL SULFATE HFA 108 (90 BASE) MCG/ACT IN AERS: 2.0000 | INHALATION_SPRAY | Freq: Once | RESPIRATORY_TRACT | Status: DC | PRN

## 2020-07-16 MED ORDER — DIPHENHYDRAMINE HCL 50 MG/ML IJ SOLN: 50.0000 mg | Freq: Once | INTRAMUSCULAR | Status: DC | PRN

## 2020-07-16 NOTE — Discharge Instructions (Signed)

## 2020-07-16 NOTE — Progress Notes (Signed)
°  Diagnosis: COVID-19  Physician: Dr. Joya Gaskins  Procedure: [facility] Infusion Clinic Med: casirivimab\imdevimab infusion - Provided patient with casirivimab\imdevimab fact sheet for patients, parents and caregivers prior to infusion.  Complications: No immediate complications noted.  Discharge: Discharged home   Tia Masker 07/16/2020

## 2020-07-23 NOTE — Progress Notes (Signed)
CARDIOLOGY OFFICE NOTE  Date:  07/30/2020    [name] Date of Birth: [birth year] Medical Record #[id number]  PCP:  Susy Frizzle, MD  Cardiologist:  Gillian Shields   Chief Complaint  Patient presents with  . Follow-up    Seen for Dr. Johnsie Cancel    History of Present Illness: [name] is a 64 y.o. male who presents today for a follow up visit. Seen for Dr. Johnsie Cancel.  He has a history ofhistory ofCAD s/p PCI of RCA 2005, PCI of Cx 2007, CABG x 4 in 2014, PCI of OM 03/2018, post-op atrial fib (on amiodarone briefly), OSA on CPAP, hypertension, hyperlipidemia, obesity, GERD, CKD stage III,ongoingtobacco abuse&statin intolerance.   He was admitted in June of 2019 following outpatient OV with exertional chest pain. He was loaded with Plavix asoutpatientand underwent left heart cath on 6/24/19whichrevealed an occluded SVG to circumflex obtuse marginal branch.He had high-grade obtuse marginal branch in-stent restenosis of old stent and a DES was placed to open that stented area with good result. DAPT for minimum of 12 months recommended.Referred to the lipid clinic. Continued to smoke. Lung cancer screening CT negative 11/2019.   Last seen by Dr. Johnsie Cancel in February - felt to be doing ok.   Comes in today. Here alone. He notes he gets tired more easy. "Little chest pressure" at times - seems to be getting more out of breath more recently. It is more his breathing that he notes rather than the chest discomfort. No NTG use reported. He does use Viagra. He had a CT screening for lung cancer back in March - has emphysema noted. EKG is ok today. No real exercise. Still smoking. Trying to smoke less. Does not quantify.  His statin causes some myalgias - ends up taking only a few times a week. He has tried numerous ones and has the same effect - he is asking "about the cholesterol shots". Had COVID 2 weeks ago. He did the infusion therapy and recovered. Just returned to  work.  He was not vaccinated prior. He is limited by his hips. He has been trying to work on his weight but notes he did not wear his steel toed shoes today and not carrying 2 phones.   Past Medical History:  Diagnosis Date  . CKD (chronic kidney disease) stage 3, GFR 30-59 ml/min (HCC)   . COLONIC POLYPS, HX OF   . CORONARY ARTERY DISEASE    a. s/p stent to RCA 2005. b. Stent to Cx 2007. c.  CABG X4 2014; d. LHC 04/19/18 occluded SVG-Circ, DES to in-stent restenosis of circ.   . Diverticulosis   . DIVERTICULOSIS, COLON   . DYSPHAGIA UNSPECIFIED    intermittent  . GERD   . HEMORRHOIDS   . HYPERLIPIDEMIA   . HYPERTENSION   . HYPERTRIGLYCERIDEMIA   . Left carotid artery occlusion   . OBESITY   . Postoperative atrial fibrillation ([place]) 2014  . SLEEP APNEA    on CPAP  . Statin intolerance   . Tobacco abuse   . Vertebral artery occlusion, left     Past Surgical History:  Procedure Laterality Date  . ACHILLES TENDON REPAIR    . BACK SURGERY     lower back  . CARDIAC CATHETERIZATION    . CORONARY ARTERY BYPASS GRAFT  08/18/2012   Procedure: CORONARY ARTERY BYPASS GRAFTING (CABG);  Surgeon: Gaye Pollack, MD;  Location: [facility];  Service: Open Heart Surgery;  Laterality: N/A;  CABG x four;  using left internal mammary artery and right leg greater saphenous vein harvested endoscopically  . CORONARY STENT INTERVENTION N/A 04/19/2018   Procedure: CORONARY STENT INTERVENTION;  Surgeon: Lorretta Harp, MD;  Location: [facility];  Service: Cardiovascular;  Laterality: N/A;  . KNEE ARTHROSCOPY     RIGHT  . LEFT HEART CATH AND CORS/GRAFTS ANGIOGRAPHY N/A 04/19/2018   Procedure: LEFT HEART CATH AND CORS/GRAFTS ANGIOGRAPHY;  Surgeon: Lorretta Harp, MD;  Location: [facility];  Service: Cardiovascular;  Laterality: N/A;  . LUMBAR LAMINECTOMY/DECOMPRESSION MICRODISCECTOMY N/A 12/05/2015   Procedure: LUMBAR 2-3, LUMBAR 3-4 DECOMPRESSION ;  Surgeon: Phylliss Bob, MD;  Location:  [facility];  Service: Orthopedics;  Laterality: N/A;  . Right long finger extensor tendon repair       Medications: Current Meds  Medication Sig  . aspirin EC 81 MG tablet Take 1 tablet (81 mg total) by mouth daily.  . clopidogrel (PLAVIX) 75 MG tablet TAKE 1 TABLET BY MOUTH ONCE A DAY  . losartan-hydrochlorothiazide (HYZAAR) 100-12.5 MG tablet TAKE 1 TABLET BY MOUTH ONCE A DAY  . nitroGLYCERIN (NITROSTAT) 0.4 MG SL tablet Place 1 tablet (0.4 mg total) under the tongue every 5 (five) minutes as needed for chest pain.  . pantoprazole (PROTONIX) 40 MG tablet TAKE 1 TABLET BY MOUTH ONCE A DAY  . rosuvastatin (CRESTOR) 5 MG tablet TAKE 1 TABLET BY MOUTH ONCE A DAY  . sildenafil (VIAGRA) 100 MG tablet Take 0.5-1 tablets (50-100 mg total) by mouth daily as needed for erectile dysfunction.     Allergies: Allergies  Allergen Reactions  . Statins Other (See Comments)    Bone and muscle pain  . Oxycodone Other (See Comments)  . Hydrocodone Nausea And Vomiting    Social History: The patient  reports that he has been smoking cigarettes. He has a 64.50 pack-year smoking history. He has never used smokeless tobacco. He reports current alcohol use. He reports that he does not use drugs.   Family History: The patient's family history includes Cancer in his mother; Heart disease in his brother.   Review of Systems: Please see the history of present illness.   All other systems are reviewed and negative.   Physical Exam: VS:  BP 128/62   Pulse 77   Ht 5\' 9"  (1.753 m)   Wt 209 lb 6.4 oz (95 kg)   SpO2 96%   BMI 30.92 kg/m  .  BMI Body mass index is 30.92 kg/m.  Wt Readings from Last 3 Encounters:  07/30/20 209 lb 6.4 oz (95 kg)  05/07/20 212 lb (96.2 kg)  01/09/20 221 lb (100.2 kg)    General: Alert and in no acute distress. He is down 12 pounds.  Cardiac: Regular rate and rhythm. No murmurs, rubs, or gallops. No edema.  Respiratory:  Lungs are clear to auscultation bilaterally with  normal work of breathing.  GI: Soft and nontender.  MS: No deformity or atrophy. Gait and ROM intact.  Skin: Warm and dry. Color is normal.  Neuro:  Strength and sensation are intact and no gross focal deficits noted.  Psych: Alert, appropriate and with normal affect.   LABORATORY DATA:  EKG:  EKG is ordered today.  Personally reviewed by me. This demonstrates NSR - HR is 77.  Lab Results  Component Value Date   WBC 7.2 05/27/2019   HGB 14.6 05/27/2019   HCT 44.4 05/27/2019   PLT 188 05/27/2019   GLUCOSE 95 05/27/2019  CHOL 131 05/27/2019   TRIG 117 05/27/2019   HDL 45 05/27/2019   LDLDIRECT 77.2 07/10/2009   LDLCALC 63 05/27/2019   ALT 17 05/27/2019   AST 18 05/27/2019   NA 138 05/27/2019   K 4.0 05/27/2019   CL 102 05/27/2019   CREATININE 1.41 (H) 05/27/2019   BUN 22 05/27/2019   CO2 20 05/27/2019   INR 1.08 03/10/2017   HGBA1C 5.5 11/18/2018     BNP (last 3 results) No results for input(s): BNP in the last 8760 hours.  ProBNP (last 3 results) No results for input(s): PROBNP in the last 8760 hours.   Other Studies Reviewed Today:  CORONARY STENT INTERVENTION6/2019  LEFT HEART CATH AND CORS/GRAFTS ANGIOGRAPHY  Conclusion     Mid RCA to Dist RCA lesion is 100% stenosed.  Ost LAD to Prox LAD lesion is 100% stenosed.  Prox RCA to Mid RCA lesion is 75% stenosed.  Prox Cx to Mid Cx lesion is 95% stenosed.  Origin lesion is 100% stenosed.  A stent was successfully placed.  Post intervention, there is a 0% residual stenosis.  LV end diastolic pressure is mildly elevated.  The left ventricular ejection fraction is 50-55% by visual estimate.  The left ventricular systolic function is normal.       Assessment/Plan:  1. CAD - has had multiple caths/PCI/CABG and last was PCI in June of 2019 - to occluded SVG to LCX/OM branch - had high grade OM branch ISR of the old stent - on lifelong DAPT. Having more shortness of breath - with known  emphysema - some chest discomfort. We discussed medical management with adding Imdur - he is on Viagra - will check Lexiscan to make sure not high risk study - if ok and symptoms persist, he is to let us know.   2. HTN - BP is ok - would continue on current regimen.   3. HLD - on statin - does have some tolerability issues - will get fasting labs - referral to the lipid clinic here at [facility].   4. Tobacco abuse - with emphysema  - he is not ready to stop yet.   Current medicines are reviewed with the patient today.  The patient does not have concerns regarding medicines other than what has been noted above.  The following changes have been made:  See above.  Labs/ tests ordered today include:    Orders Placed This Encounter  Procedures  . Basic metabolic panel  . CBC  . Hepatic function panel  . Lipid panel  . MYOCARDIAL PERFUSION IMAGING  . EKG 12-Lead     Disposition:   FU with Dr. Johnsie Cancel in 6 months tentatively. If stress test ok and symptoms persist - he is to let us know. Could consider Imdur and stop Viagra. Referral to lipid clinic. Smoking cessation strongly encouraged.    Patient is agreeable to this plan and will call if any problems develop in the interim.   SignedTruitt Merle, NP  07/30/2020 4:11 PM  [facility] [address] [place], [place]  [address] Phone: [phone] Fax: [phone]

## 2020-07-30 ENCOUNTER — Encounter: Payer: Self-pay | Admitting: Nurse Practitioner

## 2020-07-30 ENCOUNTER — Other Ambulatory Visit: Payer: Self-pay

## 2020-07-30 ENCOUNTER — Ambulatory Visit (INDEPENDENT_AMBULATORY_CARE_PROVIDER_SITE_OTHER): Payer: BC Managed Care – PPO | Admitting: Nurse Practitioner

## 2020-07-30 VITALS — BP 128/62 | HR 77 | Ht 69.0 in | Wt 209.4 lb

## 2020-07-30 DIAGNOSIS — I2510 Atherosclerotic heart disease of native coronary artery without angina pectoris: Secondary | ICD-10-CM

## 2020-07-30 DIAGNOSIS — Z789 Other specified health status: Secondary | ICD-10-CM

## 2020-07-30 DIAGNOSIS — I10 Essential (primary) hypertension: Secondary | ICD-10-CM

## 2020-07-30 DIAGNOSIS — F17200 Nicotine dependence, unspecified, uncomplicated: Secondary | ICD-10-CM

## 2020-07-30 DIAGNOSIS — E785 Hyperlipidemia, unspecified: Secondary | ICD-10-CM

## 2020-07-30 DIAGNOSIS — I480 Paroxysmal atrial fibrillation: Secondary | ICD-10-CM

## 2020-07-30 NOTE — Patient Instructions (Addendum)
After Visit Summary:  We will be checking the following labs today - NONE  Fasting labs - BMET, CBC, HPF and Lipids   Medication Instructions:    Continue with your current medicines.    If you need a refill on your cardiac medications before your next appointment, please call your pharmacy.     Testing/Procedures To Be Arranged: [facility] are scheduled for a Myocardial Perfusion Imaging Study on ________________________ at ______________________________.   Please arrive 15 minutes prior to your appointment time for registration and insurance purposes.   The test will take approximately 3 to 4 hours to complete; you may bring reading material. If someone comes with you to your appointment, they will need to remain in the main lobby due to limited space in the testing area.   How to prepare for your Myocardial Perfusion test:   Do not eat or drink 3 hours prior to your test, except you may have water.    Do not consume products containing caffeine (regular or decaffeinated) 12 hours prior to your test (ex: coffee, chocolate, soda, tea)   Do bring a list of your current medications with you. If not listed below, you may take your medications as normal.   Bring any held medication to your appointment, as you may be required to take it once the test is complete.   Do wear comfortable clothes (no dresses or overalls) and walking shoes. Tennis shoes are preferred. No heels or open toed shoes.  Do not wear cologne, perfume, aftershave or lotions (deodorant is allowed).   If these instructions are not followed, you test will have to be rescheduled.   Please report to [address] for your test. If you have questions or concerns about your appointment, please call the Nuclear Lab at [phone].  If you cannot keep your appointment, please provide 24 hour notification to the Nuclear lab to avoid a possible $50 charge to your account.      Follow-Up:   See Dr. Johnsie Cancel in about 6 months - but if your stress test turns out ok and you are still having symptoms - you need to get back in touch with [place].     At [facility], you and your health needs are our priority.  As part of our continuing mission to provide you with exceptional heart care, we have created designated Provider Care Teams.  These Care Teams include your primary Cardiologist (physician) and Advanced Practice Providers (APPs -  Physician Assistants and Nurse Practitioners) who all work together to provide you with the care you need, when you need it.  Special Instructions:  . Stay safe, wash your hands for at least 20 seconds and wear a mask when needed.  . It was good to talk with you today.    Call the [facility] office at [phone] if you have any questions, problems or concerns.

## 2020-08-01 ENCOUNTER — Telehealth (HOSPITAL_COMMUNITY): Payer: Self-pay | Admitting: *Deleted

## 2020-08-01 NOTE — Telephone Encounter (Signed)
Patient given detailed instructions per Myocardial Perfusion Study Information Sheet for the test on 08/03/20 at 7:45. Patient notified to arrive 15 minutes early and that it is imperative to arrive on time for appointment to keep from having the test rescheduled.  If you need to cancel or reschedule your appointment, please call the office within 24 hours of your appointment. . Patient verbalized understanding.[name]

## 2020-08-03 ENCOUNTER — Other Ambulatory Visit: Payer: BC Managed Care – PPO | Admitting: *Deleted

## 2020-08-03 ENCOUNTER — Other Ambulatory Visit: Payer: Self-pay

## 2020-08-03 ENCOUNTER — Ambulatory Visit (HOSPITAL_COMMUNITY): Payer: BC Managed Care – PPO | Attending: Cardiology

## 2020-08-03 DIAGNOSIS — E785 Hyperlipidemia, unspecified: Secondary | ICD-10-CM

## 2020-08-03 DIAGNOSIS — I480 Paroxysmal atrial fibrillation: Secondary | ICD-10-CM

## 2020-08-03 DIAGNOSIS — I2510 Atherosclerotic heart disease of native coronary artery without angina pectoris: Secondary | ICD-10-CM

## 2020-08-03 LAB — CBC
Hematocrit: 38.2 % (ref 37.5–51.0)
Hemoglobin: 13.1 g/dL (ref 13.0–17.7)
MCH: 30.2 pg (ref 26.6–33.0)
MCHC: 34.3 g/dL (ref 31.5–35.7)
MCV: 88 fL (ref 79–97)
Platelets: 278 10*3/uL (ref 150–450)
RBC: 4.34 x10E6/uL (ref 4.14–5.80)
RDW: 12.1 % (ref 11.6–15.4)
WBC: 6.8 10*3/uL (ref 3.4–10.8)

## 2020-08-03 LAB — BASIC METABOLIC PANEL
BUN/Creatinine Ratio: 15 (ref 10–24)
BUN: 22 mg/dL (ref 8–27)
CO2: 21 mmol/L (ref 20–29)
Calcium: 9.3 mg/dL (ref 8.6–10.2)
Chloride: 101 mmol/L (ref 96–106)
Creatinine, Ser: 1.43 mg/dL — ABNORMAL HIGH (ref 0.76–1.27)
GFR calc Af Amer: 59 mL/min/{1.73_m2} — ABNORMAL LOW (ref >59)
GFR calc non Af Amer: 51 mL/min/{1.73_m2} — ABNORMAL LOW (ref >59)
Glucose: 100 mg/dL — ABNORMAL HIGH (ref 65–99)
Potassium: 4.2 mmol/L (ref 3.5–5.2)
Sodium: 137 mmol/L (ref 134–144)

## 2020-08-03 LAB — HEPATIC FUNCTION PANEL
ALT: 18 IU/L (ref 0–44)
AST: 19 IU/L (ref 0–40)
Albumin: 4.3 g/dL (ref 3.8–4.8)
Alkaline Phosphatase: 59 IU/L (ref 44–121)
Bilirubin Total: 0.6 mg/dL (ref 0.0–1.2)
Bilirubin, Direct: 0.2 mg/dL (ref 0.00–0.40)
Total Protein: 6.8 g/dL (ref 6.0–8.5)

## 2020-08-03 LAB — MYOCARDIAL PERFUSION IMAGING
LV dias vol: 98 mL (ref 62–150)
LV sys vol: 32 mL
Peak HR: 89 {beats}/min
Rest HR: 63 {beats}/min
SDS: 4
SRS: 0
SSS: 4
TID: 1.04

## 2020-08-03 LAB — LIPID PANEL
Chol/HDL Ratio: 3.2 ratio (ref 0.0–5.0)
Cholesterol, Total: 136 mg/dL (ref 100–199)
HDL: 42 mg/dL (ref >39)
LDL Chol Calc (NIH): 73 mg/dL (ref 0–99)
Triglycerides: 114 mg/dL (ref 0–149)
VLDL Cholesterol Cal: 21 mg/dL (ref 5–40)

## 2020-08-03 MED ORDER — TECHNETIUM TC 99M TETROFOSMIN IV KIT
10.4000 | PACK | Freq: Once | INTRAVENOUS | Status: AC | PRN
  Administered 2020-08-03: 10.4 via INTRAVENOUS
  Filled 2020-08-03: qty 11

## 2020-08-03 MED ORDER — REGADENOSON 0.4 MG/5ML IV SOLN
0.4000 mg | Freq: Once | INTRAVENOUS | Status: AC
  Administered 2020-08-03: 0.4 mg via INTRAVENOUS

## 2020-08-03 MED ORDER — TECHNETIUM TC 99M TETROFOSMIN IV KIT
31.4000 | PACK | Freq: Once | INTRAVENOUS | Status: AC | PRN
  Administered 2020-08-03: 31.4 via INTRAVENOUS
  Filled 2020-08-03: qty 32

## 2020-08-07 ENCOUNTER — Telehealth: Payer: Self-pay

## 2020-08-07 NOTE — Telephone Encounter (Signed)
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 No answer. Lvm

## 2020-08-08 ENCOUNTER — Encounter: Payer: Self-pay | Admitting: Nurse Practitioner

## 2020-08-08 NOTE — Telephone Encounter (Signed)
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

## 2020-08-08 NOTE — Telephone Encounter (Signed)
Letter has been placed at the front desk for patient to pick up.

## 2020-08-16 ENCOUNTER — Ambulatory Visit (INDEPENDENT_AMBULATORY_CARE_PROVIDER_SITE_OTHER): Payer: BC Managed Care – PPO | Admitting: Family Medicine

## 2020-08-16 ENCOUNTER — Other Ambulatory Visit: Payer: Self-pay

## 2020-08-16 VITALS — BP 160/70 | HR 76 | Temp 98.7°F | Ht 69.0 in | Wt 213.0 lb

## 2020-08-16 DIAGNOSIS — I2510 Atherosclerotic heart disease of native coronary artery without angina pectoris: Secondary | ICD-10-CM

## 2020-08-16 DIAGNOSIS — M5412 Radiculopathy, cervical region: Secondary | ICD-10-CM

## 2020-08-16 MED ORDER — METHOCARBAMOL 500 MG PO TABS: 500.0000 mg | ORAL_TABLET | Freq: Four times a day (QID) | ORAL | 0 refills | Status: DC

## 2020-08-16 MED ORDER — PREDNISONE 20 MG PO TABS: ORAL_TABLET | ORAL | 0 refills | Status: DC

## 2020-08-16 NOTE — Progress Notes (Signed)
Subjective:    Patient ID: [name], male    DOB: 1956/02/13, 64 y.o.   MRN: [id number]  HPI Patient is a very pleasant 64 year old Caucasian male who presents today with left-sided neck pain.  The pain began over the weekend.  Pain radiates from the left side of his neck just behind his left ear down into his left shoulder.  He describes it as an intense deep aching pain.  He denies any numbness or tingling in his left arm.  He denies any weakness in his left arm.  He has normal reflexes checked at the brachioradialis and at the biceps.  Grip strength is normal.  He does have diminished range of motion in the cervical spine particularly with lateral rotation due to pain.  There is no tenderness to palpation over the spinous processes however he does have significant tenderness to palpation over the superior portion of the trapezius.  Patient has a positive Spurling sign. Past Medical History:  Diagnosis Date  . CKD (chronic kidney disease) stage 3, GFR 30-59 ml/min (HCC)   . COLONIC POLYPS, HX OF   . CORONARY ARTERY DISEASE    a. s/p stent to RCA 2005. b. Stent to Cx 2007. c.  CABG X4 2014; d. LHC 04/19/18 occluded SVG-Circ, DES to in-stent restenosis of circ.   . Diverticulosis   . DIVERTICULOSIS, COLON   . DYSPHAGIA UNSPECIFIED    intermittent  . GERD   . HEMORRHOIDS   . HYPERLIPIDEMIA   . HYPERTENSION   . HYPERTRIGLYCERIDEMIA   . Left carotid artery occlusion   . OBESITY   . Postoperative atrial fibrillation ([place]) 2014  . SLEEP APNEA    on CPAP  . Statin intolerance   . Tobacco abuse   . Vertebral artery occlusion, left    Past Surgical History:  Procedure Laterality Date  . ACHILLES TENDON REPAIR    . BACK SURGERY     lower back  . CARDIAC CATHETERIZATION    . CORONARY ARTERY BYPASS GRAFT  08/18/2012   Procedure: CORONARY ARTERY BYPASS GRAFTING (CABG);  Surgeon: Gaye Pollack, MD;  Location: [facility];  Service: Open Heart Surgery;  Laterality: N/A;  CABG x four;  using  left internal mammary artery and right leg greater saphenous vein harvested endoscopically  . CORONARY STENT INTERVENTION N/A 04/19/2018   Procedure: CORONARY STENT INTERVENTION;  Surgeon: Lorretta Harp, MD;  Location: [facility];  Service: Cardiovascular;  Laterality: N/A;  . KNEE ARTHROSCOPY     RIGHT  . LEFT HEART CATH AND CORS/GRAFTS ANGIOGRAPHY N/A 04/19/2018   Procedure: LEFT HEART CATH AND CORS/GRAFTS ANGIOGRAPHY;  Surgeon: Lorretta Harp, MD;  Location: [facility];  Service: Cardiovascular;  Laterality: N/A;  . LUMBAR LAMINECTOMY/DECOMPRESSION MICRODISCECTOMY N/A 12/05/2015   Procedure: LUMBAR 2-3, LUMBAR 3-4 DECOMPRESSION ;  Surgeon: Phylliss Bob, MD;  Location: [facility];  Service: Orthopedics;  Laterality: N/A;  . Right long finger extensor tendon repair     Current Outpatient Medications on File Prior to Visit  Medication Sig Dispense Refill  . aspirin EC 81 MG tablet Take 1 tablet (81 mg total) by mouth daily. 90 tablet 3  . clopidogrel (PLAVIX) 75 MG tablet TAKE 1 TABLET BY MOUTH ONCE A DAY 90 tablet 3  . losartan-hydrochlorothiazide (HYZAAR) 100-12.5 MG tablet TAKE 1 TABLET BY MOUTH ONCE A DAY 90 tablet 1  . nitroGLYCERIN (NITROSTAT) 0.4 MG SL tablet Place 1 tablet (0.4 mg total) under the tongue every  5 (five) minutes as needed for chest pain. 75 tablet 1  . pantoprazole (PROTONIX) 40 MG tablet TAKE 1 TABLET BY MOUTH ONCE A DAY 90 tablet 2  . rosuvastatin (CRESTOR) 5 MG tablet TAKE 1 TABLET BY MOUTH ONCE A DAY 90 tablet 2  . sildenafil (VIAGRA) 100 MG tablet Take 0.5-1 tablets (50-100 mg total) by mouth daily as needed for erectile dysfunction. 5 tablet 11   No current facility-administered medications on file prior to visit.   Allergies  Allergen Reactions  . Statins Other (See Comments)    Bone and muscle pain  . Oxycodone Other (See Comments)  . Hydrocodone Nausea And Vomiting   Social History   Socioeconomic History  . Marital status: Married     Spouse name: Not on file  . Number of children: Not on file  . Years of education: Not on file  . Highest education level: Not on file  Occupational History  . Not on file  Tobacco Use  . Smoking status: Current Every Day Smoker    Packs/day: 1.50    Years: 43.00    Pack years: 64.50    Types: Cigarettes  . Smokeless tobacco: Never Used  . Tobacco comment: Patient attempting to quit pt down to 1 ppd  Vaping Use  . Vaping Use: Never used  Substance and Sexual Activity  . Alcohol use: Yes    Comment: 1-2 drink occasionally  . Drug use: No  . Sexual activity: Not on file  Other Topics Concern  . Not on file  Social History Narrative  . Not on file   Social Determinants of Health   Financial Resource Strain:   . Difficulty of Paying Living Expenses: Not on file  Food Insecurity:   . Worried About Charity fundraiser in the Last Year: Not on file  . Ran Out of Food in the Last Year: Not on file  Transportation Needs:   . Lack of Transportation (Medical): Not on file  . Lack of Transportation (Non-Medical): Not on file  Physical Activity:   . Days of Exercise per Week: Not on file  . Minutes of Exercise per Session: Not on file  Stress:   . Feeling of Stress : Not on file  Social Connections:   . Frequency of Communication with Friends and Family: Not on file  . Frequency of Social Gatherings with Friends and Family: Not on file  . Attends Religious Services: Not on file  . Active Member of Clubs or Organizations: Not on file  . Attends Archivist Meetings: Not on file  . Marital Status: Not on file  Intimate Partner Violence:   . Fear of Current or Ex-Partner: Not on file  . Emotionally Abused: Not on file  . Physically Abused: Not on file  . Sexually Abused: Not on file      Review of Systems  All other systems reviewed and are negative.      Objective:   Physical Exam Vitals reviewed.  Constitutional:      Appearance: He is well-developed.    Cardiovascular:     Rate and Rhythm: Normal rate and regular rhythm.     Heart sounds: Normal heart sounds.  Pulmonary:     Effort: Pulmonary effort is normal. No respiratory distress.     Breath sounds: Normal breath sounds. No wheezing or rales.  Abdominal:     Palpations: Abdomen is not rigid.     Tenderness: Negative signs include Murphy's sign and  McBurney's sign.  Musculoskeletal:     Cervical back: Tenderness present. No swelling, erythema, spasms, torticollis, bony tenderness or crepitus. Pain with movement present. Decreased range of motion.       Back:           Assessment & Plan:  Cervical radiculopathy - Plan: DG Cervical Spine Complete  Differential diagnosis includes muscle spasms in the cervical paraspinal muscles/trapezius versus cervical radiculopathy.  I favor cervical radiculopathy given the intensity of the pain and the positive Spurling sign.  Recommend trying prednisone taper pack coupled with Robaxin 500 mg every 6 hours as needed for muscle spasms as I do believe he has compensatory muscle spasms due to the pain.  Obtain an x-ray of the cervical spine.

## 2020-08-20 ENCOUNTER — Telehealth: Payer: Self-pay | Admitting: Pulmonary Disease

## 2020-08-20 NOTE — Telephone Encounter (Signed)
Download was printed and placed up front for pick up per pt request  I spoke with him and let him now Nothing further needed

## 2020-08-24 ENCOUNTER — Ambulatory Visit
Admission: RE | Admit: 2020-08-24 | Discharge: 2020-08-24 | Disposition: A | Discharge status: Home / Self Care | Payer: BC Managed Care – PPO | Source: Ambulatory Visit | Attending: Family Medicine | Admitting: Family Medicine

## 2020-08-24 DIAGNOSIS — M5412 Radiculopathy, cervical region: Secondary | ICD-10-CM

## 2020-08-28 ENCOUNTER — Ambulatory Visit (INDEPENDENT_AMBULATORY_CARE_PROVIDER_SITE_OTHER): Payer: BC Managed Care – PPO | Admitting: Family Medicine

## 2020-08-28 ENCOUNTER — Other Ambulatory Visit: Payer: Self-pay

## 2020-08-28 ENCOUNTER — Encounter: Payer: Self-pay | Admitting: Family Medicine

## 2020-08-28 VITALS — BP 132/74 | HR 70 | Resp 12 | Ht 69.0 in | Wt 215.0 lb

## 2020-08-28 DIAGNOSIS — M4322 Fusion of spine, cervical region: Secondary | ICD-10-CM | POA: Diagnosis not present

## 2020-08-28 DIAGNOSIS — I2510 Atherosclerotic heart disease of native coronary artery without angina pectoris: Secondary | ICD-10-CM

## 2020-08-28 MED ORDER — HYDROMORPHONE HCL 2 MG PO TABS: 2.0000 mg | ORAL_TABLET | ORAL | 0 refills | Status: DC | PRN

## 2020-08-28 NOTE — Progress Notes (Signed)
Subjective:    Patient ID: [name], male    DOB: 27-Oct-1956, 64 y.o.   MRN: [id number]  HPI  08/16/20 Patient is a very pleasant 64 year old Caucasian male who presents today with left-sided neck pain.  The pain began over the weekend.  Pain radiates from the left side of his neck just behind his left ear down into his left shoulder.  He describes it as an intense deep aching pain.  He denies any numbness or tingling in his left arm.  He denies any weakness in his left arm.  He has normal reflexes checked at the brachioradialis and at the biceps.  Grip strength is normal.  He does have diminished range of motion in the cervical spine particularly with lateral rotation due to pain.  There is no tenderness to palpation over the spinous processes however he does have significant tenderness to palpation over the superior portion of the trapezius.  Patient has a positive Spurling sign.  At that time, my plan was: Differential diagnosis includes muscle spasms in the cervical paraspinal muscles/trapezius versus cervical radiculopathy.  I favor cervical radiculopathy given the intensity of the pain and the positive Spurling sign.  Recommend trying prednisone taper pack coupled with Robaxin 500 mg every 6 hours as needed for muscle spasms as I do believe he has compensatory muscle spasms due to the pain.  Obtain an x-ray of the cervical spine.  08/28/20 X-ray revealed:  IMPRESSION: 1. 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 2. Mild facet arthropathy.  Patient does demonstrate marked decrease in range of motion.  He has never had sacroiliitis.  He has never had anterior uveitis.  There was not an insidious onset.  It certainly did not occur before the age of 64.  All these would be findings not consistent with ankylosing spondylitis.  However he denies trauma to the neck.  He has  significant pain in his neck that keeps him from sleeping.  However he is no longer having any radiating pain.  He denies any arm weakness or numbness.  He has no interest in any cortisone injections in his neck. Past Medical History:  Diagnosis Date  . CKD (chronic kidney disease) stage 3, GFR 30-59 ml/min (HCC)   . COLONIC POLYPS, HX OF   . CORONARY ARTERY DISEASE    a. s/p stent to RCA 2005. b. Stent to Cx 2007. c.  CABG X4 2014; d. LHC 04/19/18 occluded SVG-Circ, DES to in-stent restenosis of circ.   . Diverticulosis   . DIVERTICULOSIS, COLON   . DYSPHAGIA UNSPECIFIED    intermittent  . GERD   . HEMORRHOIDS   . HYPERLIPIDEMIA   . HYPERTENSION   . HYPERTRIGLYCERIDEMIA   . Left carotid artery occlusion   . OBESITY   . Postoperative atrial fibrillation ([place]) 2014  . SLEEP APNEA    on CPAP  . Statin intolerance   . Tobacco abuse   . Vertebral artery occlusion, left    Past Surgical History:  Procedure Laterality Date  . ACHILLES TENDON REPAIR    . BACK SURGERY     lower back  . CARDIAC CATHETERIZATION    . CORONARY ARTERY BYPASS GRAFT  08/18/2012   Procedure: CORONARY ARTERY BYPASS GRAFTING (CABG);  Surgeon: Gaye Pollack, MD;  Location: [facility];  Service: Open Heart Surgery;  Laterality: N/A;  CABG x four;  using left internal mammary artery and right  leg greater saphenous vein harvested endoscopically  . CORONARY STENT INTERVENTION N/A 04/19/2018   Procedure: CORONARY STENT INTERVENTION;  Surgeon: Lorretta Harp, MD;  Location: [facility];  Service: Cardiovascular;  Laterality: N/A;  . KNEE ARTHROSCOPY     RIGHT  . LEFT HEART CATH AND CORS/GRAFTS ANGIOGRAPHY N/A 04/19/2018   Procedure: LEFT HEART CATH AND CORS/GRAFTS ANGIOGRAPHY;  Surgeon: Lorretta Harp, MD;  Location: [facility];  Service: Cardiovascular;  Laterality: N/A;  . LUMBAR LAMINECTOMY/DECOMPRESSION MICRODISCECTOMY N/A 12/05/2015   Procedure: LUMBAR 2-3, LUMBAR 3-4 DECOMPRESSION ;  Surgeon: Phylliss Bob, MD;  Location: [facility];  Service: Orthopedics;  Laterality: N/A;  . Right long finger extensor tendon repair     Current Outpatient Medications on File Prior to Visit  Medication Sig Dispense Refill  . aspirin EC 81 MG tablet Take 1 tablet (81 mg total) by mouth daily. 90 tablet 3  . clopidogrel (PLAVIX) 75 MG tablet TAKE 1 TABLET BY MOUTH ONCE A DAY 90 tablet 3  . losartan-hydrochlorothiazide (HYZAAR) 100-12.5 MG tablet TAKE 1 TABLET BY MOUTH ONCE A DAY 90 tablet 1  . methocarbamol (ROBAXIN) 500 MG tablet Take 1 tablet (500 mg total) by mouth 4 (four) times daily. 30 tablet 0  . nitroGLYCERIN (NITROSTAT) 0.4 MG SL tablet Place 1 tablet (0.4 mg total) under the tongue every 5 (five) minutes as needed for chest pain. 75 tablet 1  . pantoprazole (PROTONIX) 40 MG tablet TAKE 1 TABLET BY MOUTH ONCE A DAY 90 tablet 2  . rosuvastatin (CRESTOR) 5 MG tablet TAKE 1 TABLET BY MOUTH ONCE A DAY 90 tablet 2  . sildenafil (VIAGRA) 100 MG tablet Take 0.5-1 tablets (50-100 mg total) by mouth daily as needed for erectile dysfunction. 5 tablet 11  . predniSONE (DELTASONE) 20 MG tablet 3 tabs poqday 1-2, 2 tabs poqday 3-4, 1 tab poqday 5-6 (Patient not taking: Reported on 08/28/2020) 12 tablet 0   No current facility-administered medications on file prior to visit.   Allergies  Allergen Reactions  . Statins Other (See Comments)    Bone and muscle pain  . Oxycodone Other (See Comments)  . Hydrocodone Nausea And Vomiting   Social History   Socioeconomic History  . Marital status: Married    Spouse name: Not on file  . Number of children: Not on file  . Years of education: Not on file  . Highest education level: Not on file  Occupational History  . Not on file  Tobacco Use  . Smoking status: Current Every Day Smoker    Packs/day: 1.50    Years: 43.00    Pack years: 64.50    Types: Cigarettes  . Smokeless tobacco: Never Used  . Tobacco comment: Patient attempting to quit pt down to 1 ppd    Vaping Use  . Vaping Use: Never used  Substance and Sexual Activity  . Alcohol use: Yes    Comment: 1-2 drink occasionally  . Drug use: No  . Sexual activity: Not on file  Other Topics Concern  . Not on file  Social History Narrative  . Not on file   Social Determinants of Health   Financial Resource Strain:   . Difficulty of Paying Living Expenses: Not on file  Food Insecurity:   . Worried About Charity fundraiser in the Last Year: Not on file  . Ran Out of Food in the Last Year: Not on file  Transportation Needs:   . Lack of  Transportation (Medical): Not on file  . Lack of Transportation (Non-Medical): Not on file  Physical Activity:   . Days of Exercise per Week: Not on file  . Minutes of Exercise per Session: Not on file  Stress:   . Feeling of Stress : Not on file  Social Connections:   . Frequency of Communication with Friends and Family: Not on file  . Frequency of Social Gatherings with Friends and Family: Not on file  . Attends Religious Services: Not on file  . Active Member of Clubs or Organizations: Not on file  . Attends Archivist Meetings: Not on file  . Marital Status: Not on file  Intimate Partner Violence:   . Fear of Current or Ex-Partner: Not on file  . Emotionally Abused: Not on file  . Physically Abused: Not on file  . Sexually Abused: Not on file      Review of Systems  All other systems reviewed and are negative.      Objective:   Physical Exam Vitals reviewed.  Constitutional:      Appearance: He is well-developed.  Cardiovascular:     Rate and Rhythm: Normal rate and regular rhythm.     Heart sounds: Normal heart sounds.  Pulmonary:     Effort: Pulmonary effort is normal. No respiratory distress.     Breath sounds: Normal breath sounds. No wheezing or rales.  Abdominal:     Palpations: Abdomen is not rigid.     Tenderness: Negative signs include Murphy's sign and McBurney's sign.  Musculoskeletal:     Cervical back:  Tenderness present. No swelling, erythema, spasms, torticollis, bony tenderness or crepitus. Pain with movement present. Decreased range of motion.       Back:           Assessment & Plan:  Cervical spine ankylosis  At the present time his history suggest against ankylosing spondylitis.  Onset was after age 67.  He denies any history of sacroiliitis or anterior uveitis.  However x-ray findings are certainly consistent with ankylosing spondylitis.  He is unable to tolerate NSAIDs due to his history of cardiovascular disease and dual antiplatelet therapy.  We discussed possible referral to rheumatology to discuss immunomodulators that may help however at the present time he would like to try Dilaudid sparingly at night to help him sleep.  He is unable to take hydrocodone or Percocet due to his allergy to codeine.  He will use 2 mg at night to help him sleep and we will see if the pain improves.  If not, consider referral to rheumatology.

## 2020-08-29 ENCOUNTER — Telehealth: Payer: Self-pay

## 2020-08-29 NOTE — Telephone Encounter (Signed)
Mr. Golebiewski said his neck is just sore but other than that he's doing ok.

## 2020-11-05 ENCOUNTER — Other Ambulatory Visit: Payer: Self-pay | Admitting: Cardiovascular Disease

## 2020-11-05 DIAGNOSIS — N183 Chronic kidney disease, stage 3 unspecified: Secondary | ICD-10-CM

## 2020-11-05 DIAGNOSIS — I2510 Atherosclerotic heart disease of native coronary artery without angina pectoris: Secondary | ICD-10-CM

## 2020-11-05 DIAGNOSIS — E782 Mixed hyperlipidemia: Secondary | ICD-10-CM

## 2020-11-05 DIAGNOSIS — I10 Essential (primary) hypertension: Secondary | ICD-10-CM

## 2021-01-10 ENCOUNTER — Other Ambulatory Visit: Payer: Self-pay | Admitting: Cardiovascular Disease

## 2021-01-10 DIAGNOSIS — I2510 Atherosclerotic heart disease of native coronary artery without angina pectoris: Secondary | ICD-10-CM

## 2021-01-10 DIAGNOSIS — E782 Mixed hyperlipidemia: Secondary | ICD-10-CM

## 2021-01-10 DIAGNOSIS — I10 Essential (primary) hypertension: Secondary | ICD-10-CM

## 2021-01-10 DIAGNOSIS — N183 Chronic kidney disease, stage 3 unspecified: Secondary | ICD-10-CM

## 2021-01-30 ENCOUNTER — Other Ambulatory Visit: Payer: Self-pay | Admitting: Cardiovascular Disease

## 2021-01-30 DIAGNOSIS — I2510 Atherosclerotic heart disease of native coronary artery without angina pectoris: Secondary | ICD-10-CM

## 2021-01-30 DIAGNOSIS — N183 Chronic kidney disease, stage 3 unspecified: Secondary | ICD-10-CM

## 2021-01-30 DIAGNOSIS — E782 Mixed hyperlipidemia: Secondary | ICD-10-CM

## 2021-01-30 DIAGNOSIS — I10 Essential (primary) hypertension: Secondary | ICD-10-CM

## 2021-02-01 ENCOUNTER — Encounter: Payer: Self-pay | Admitting: Family Medicine

## 2021-02-01 ENCOUNTER — Ambulatory Visit (INDEPENDENT_AMBULATORY_CARE_PROVIDER_SITE_OTHER): Payer: BC Managed Care – PPO | Admitting: Family Medicine

## 2021-02-01 ENCOUNTER — Other Ambulatory Visit: Payer: Self-pay

## 2021-02-01 VITALS — BP 148/88 | HR 82 | Temp 98.2°F | Resp 16 | Ht 69.0 in | Wt 215.0 lb

## 2021-02-01 DIAGNOSIS — L989 Disorder of the skin and subcutaneous tissue, unspecified: Secondary | ICD-10-CM

## 2021-02-04 NOTE — Progress Notes (Signed)
   Subjective:    Patient ID: [name], male    DOB: [birth year], 65 y.o.   MRN: [id number]  HPI  Patient presents with a 6 mm skin colored nodule on the right posterior neck right at the hairline.  It appears to be an ingrown hair.  Patient states that it was bleeding earlier this morning after he tried to scrub it aggressively.  This prompted him to come to the office for evaluation.  There is no erythema or warmth or evidence of an abscess.  Review of Systems     Objective:   Physical Exam        Assessment & Plan:  This could be an ingrown hair or an early skin cancer.  I recommended that we watch it for the next 2 weeks.  If it persist over the next 2 weeks and does not resolve on its own, he can come back for a shave biopsy.  Patient is in agreement with this plan.  He will not be charged today for this visit as we have decided simply to monitor it for 2 weeks before making a biopsy

## 2021-02-18 ENCOUNTER — Telehealth: Payer: Medicare Other | Admitting: Nurse Practitioner

## 2021-03-26 ENCOUNTER — Ambulatory Visit (INDEPENDENT_AMBULATORY_CARE_PROVIDER_SITE_OTHER): Payer: BC Managed Care – PPO | Admitting: Family Medicine

## 2021-03-26 ENCOUNTER — Other Ambulatory Visit: Payer: Self-pay

## 2021-03-26 ENCOUNTER — Encounter: Payer: Self-pay | Admitting: Family Medicine

## 2021-03-26 VITALS — BP 148/82 | HR 88 | Temp 99.0°F | Resp 16 | Ht 69.0 in | Wt 214.0 lb

## 2021-03-26 DIAGNOSIS — L600 Ingrowing nail: Secondary | ICD-10-CM | POA: Diagnosis not present

## 2021-03-26 DIAGNOSIS — I2510 Atherosclerotic heart disease of native coronary artery without angina pectoris: Secondary | ICD-10-CM

## 2021-03-26 NOTE — Progress Notes (Signed)
Subjective:    Patient ID: [name], male    DOB: Jan 11, 1956, 65 y.o.   MRN: [id number]  HPI  The nail margins on either side of his great toenail are ingrown and painful.  He is requesting that I remove the ingrown portions on either side of his great toenail.  This is true of both his left and his right foot.  Ultimately after discussing the situation further, together we decided to remove the entire portion of the nail as I was concerned that the remaining nail unsupported on either margin would likely get hung on as she and be accidentally ripped off.  Therefore the patient agreed. Past Medical History:  Diagnosis Date  . Cervical spine ankylosis   . CKD (chronic kidney disease) stage 3, GFR 30-59 ml/min (HCC)   . COLONIC POLYPS, HX OF   . CORONARY ARTERY DISEASE    a. s/p stent to RCA 2005. b. Stent to Cx 2007. c.  CABG X4 2014; d. LHC 04/19/18 occluded SVG-Circ, DES to in-stent restenosis of circ.   . Diverticulosis   . DIVERTICULOSIS, COLON   . DYSPHAGIA UNSPECIFIED    intermittent  . GERD   . HEMORRHOIDS   . HYPERLIPIDEMIA   . HYPERTENSION   . HYPERTRIGLYCERIDEMIA   . Left carotid artery occlusion   . OBESITY   . Postoperative atrial fibrillation ([place]) 2014  . SLEEP APNEA    on CPAP  . Statin intolerance   . Tobacco abuse   . Vertebral artery occlusion, left    Past Surgical History:  Procedure Laterality Date  . ACHILLES TENDON REPAIR    . BACK SURGERY     lower back  . CARDIAC CATHETERIZATION    . CORONARY ARTERY BYPASS GRAFT  08/18/2012   Procedure: CORONARY ARTERY BYPASS GRAFTING (CABG);  Surgeon: Gaye Pollack, MD;  Location: [facility];  Service: Open Heart Surgery;  Laterality: N/A;  CABG x four;  using left internal mammary artery and right leg greater saphenous vein harvested endoscopically  . CORONARY STENT INTERVENTION N/A 04/19/2018   Procedure: CORONARY STENT INTERVENTION;  Surgeon: Lorretta Harp, MD;  Location: [facility];  Service:  Cardiovascular;  Laterality: N/A;  . KNEE ARTHROSCOPY     RIGHT  . LEFT HEART CATH AND CORS/GRAFTS ANGIOGRAPHY N/A 04/19/2018   Procedure: LEFT HEART CATH AND CORS/GRAFTS ANGIOGRAPHY;  Surgeon: Lorretta Harp, MD;  Location: [facility];  Service: Cardiovascular;  Laterality: N/A;  . LUMBAR LAMINECTOMY/DECOMPRESSION MICRODISCECTOMY N/A 12/05/2015   Procedure: LUMBAR 2-3, LUMBAR 3-4 DECOMPRESSION ;  Surgeon: Phylliss Bob, MD;  Location: [facility];  Service: Orthopedics;  Laterality: N/A;  . Right long finger extensor tendon repair     Current Outpatient Medications on File Prior to Visit  Medication Sig Dispense Refill  . aspirin EC 81 MG tablet Take 1 tablet (81 mg total) by mouth daily. 90 tablet 3  . clopidogrel (PLAVIX) 75 MG tablet TAKE 1 TABLET BY MOUTH ONCE A DAY 90 tablet 1  . HYDROmorphone (DILAUDID) 2 MG tablet Take 1 tablet (2 mg total) by mouth every 4 (four) hours as needed for severe pain. 30 tablet 0  . losartan-hydrochlorothiazide (HYZAAR) 100-12.5 MG tablet TAKE 1 TABLET BY MOUTH ONCE A DAY 90 tablet 2  . methocarbamol (ROBAXIN) 500 MG tablet Take 1 tablet (500 mg total) by mouth 4 (four) times daily. 30 tablet 0  . nitroGLYCERIN (NITROSTAT) 0.4 MG SL tablet Place 1 tablet (0.4 mg total)  under the tongue every 5 (five) minutes as needed for chest pain. 75 tablet 1  . pantoprazole (PROTONIX) 40 MG tablet TAKE 1 TABLET BY MOUTH ONCE A DAY 90 tablet 2  . rosuvastatin (CRESTOR) 5 MG tablet TAKE 1 TABLET BY MOUTH ONCE A DAY 90 tablet 2  . sildenafil (VIAGRA) 100 MG tablet Take 0.5-1 tablets (50-100 mg total) by mouth daily as needed for erectile dysfunction. 5 tablet 11   No current facility-administered medications on file prior to visit.   Allergies  Allergen Reactions  . Statins Other (See Comments)    Bone and muscle pain  . Oxycodone Other (See Comments)  . Hydrocodone Nausea And Vomiting   Social History   Socioeconomic History  . Marital status: Married     Spouse name: Not on file  . Number of children: Not on file  . Years of education: Not on file  . Highest education level: Not on file  Occupational History  . Not on file  Tobacco Use  . Smoking status: Current Every Day Smoker    Packs/day: 1.50    Years: 43.00    Pack years: 64.50    Types: Cigarettes  . Smokeless tobacco: Never Used  . Tobacco comment: Patient attempting to quit pt down to 1 ppd  Vaping Use  . Vaping Use: Never used  Substance and Sexual Activity  . Alcohol use: Yes    Comment: 1-2 drink occasionally  . Drug use: No  . Sexual activity: Not on file  Other Topics Concern  . Not on file  Social History Narrative  . Not on file   Social Determinants of Health   Financial Resource Strain: Not on file  Food Insecurity: Not on file  Transportation Needs: Not on file  Physical Activity: Not on file  Stress: Not on file  Social Connections: Not on file  Intimate Partner Violence: Not on file    .  Review of Systems  All other systems reviewed and are negative.      Objective:   Physical Exam Vitals reviewed.  Constitutional:      Appearance: He is normal weight.  Cardiovascular:     Rate and Rhythm: Normal rate and regular rhythm.  Pulmonary:     Effort: Pulmonary effort is normal.     Breath sounds: Normal breath sounds.  Feet:     Right foot:     Toenail Condition: Right toenails are ingrown.     Left foot:     Toenail Condition: Left toenails are ingrown.  Neurological:     Mental Status: He is alert.           Assessment & Plan:  Ingrown toenail of both feet  Both the medial and lateral nail margins of both the left and the right great toe are ingrown.  Therefore we decided to remove the entire toenail on both feet.  First, we performed the left foot.  I achieved anesthesia using a digital block and placed a tourniquet at the base of his toe.  I separated the toenail from the underlying nailbed using a metal elevator and remove  the entire toenail with gentle traction using a pair hemostats.  I then covered the wound bed with Neosporin, wrapped in petroleum gauze, and wrapped the entire toe and Coban.  The tourniquet was removed.  Total tourniquet time was less than 2 minutes.  I then performed the same procedure on the right great toenail.  Patient tolerated both procedures without  complication.  There was minimal blood loss.  Wound care was discussed.

## 2021-04-30 ENCOUNTER — Other Ambulatory Visit: Payer: Self-pay | Admitting: Cardiovascular Disease

## 2021-04-30 DIAGNOSIS — I2510 Atherosclerotic heart disease of native coronary artery without angina pectoris: Secondary | ICD-10-CM

## 2021-04-30 DIAGNOSIS — E782 Mixed hyperlipidemia: Secondary | ICD-10-CM

## 2021-04-30 DIAGNOSIS — I10 Essential (primary) hypertension: Secondary | ICD-10-CM

## 2021-04-30 DIAGNOSIS — N183 Chronic kidney disease, stage 3 unspecified: Secondary | ICD-10-CM

## 2021-05-01 ENCOUNTER — Emergency Department (HOSPITAL_COMMUNITY): Payer: BC Managed Care – PPO

## 2021-05-01 ENCOUNTER — Telehealth: Payer: Self-pay | Admitting: Cardiovascular Disease

## 2021-05-01 ENCOUNTER — Other Ambulatory Visit: Payer: Self-pay

## 2021-05-01 ENCOUNTER — Emergency Department (HOSPITAL_COMMUNITY)
Admission: EM | Admit: 2021-05-01 | Discharge: 2021-05-01 | Disposition: A | Discharge status: Home / Self Care | Payer: BC Managed Care – PPO | Attending: Emergency Medicine | Admitting: Emergency Medicine

## 2021-05-01 DIAGNOSIS — Z5321 Procedure and treatment not carried out due to patient leaving prior to being seen by health care provider: Secondary | ICD-10-CM | POA: Diagnosis not present

## 2021-05-01 DIAGNOSIS — R072 Precordial pain: Secondary | ICD-10-CM | POA: Insufficient documentation

## 2021-05-01 LAB — COMPREHENSIVE METABOLIC PANEL
ALT: 21 U/L (ref 0–44)
AST: 21 U/L (ref 15–41)
Albumin: 4 g/dL (ref 3.5–5.0)
Alkaline Phosphatase: 43 U/L (ref 38–126)
Anion gap: 11 (ref 5–15)
BUN: 24 mg/dL — ABNORMAL HIGH (ref 8–23)
CO2: 21 mmol/L — ABNORMAL LOW (ref 22–32)
Calcium: 9.1 mg/dL (ref 8.9–10.3)
Chloride: 105 mmol/L (ref 98–111)
Creatinine, Ser: 1.8 mg/dL — ABNORMAL HIGH (ref 0.61–1.24)
GFR, Estimated: 41 mL/min — ABNORMAL LOW (ref >60)
Glucose, Bld: 94 mg/dL (ref 70–99)
Potassium: 3.9 mmol/L (ref 3.5–5.1)
Sodium: 137 mmol/L (ref 135–145)
Total Bilirubin: 0.8 mg/dL (ref 0.3–1.2)
Total Protein: 6.7 g/dL (ref 6.5–8.1)

## 2021-05-01 LAB — CBC WITH DIFFERENTIAL/PLATELET
Abs Immature Granulocytes: 0.02 10*3/uL (ref 0.00–0.07)
Basophils Absolute: 0.1 10*3/uL (ref 0.0–0.1)
Basophils Relative: 1 %
Eosinophils Absolute: 0.5 10*3/uL (ref 0.0–0.5)
Eosinophils Relative: 7 %
HCT: 42.4 % (ref 39.0–52.0)
Hemoglobin: 14.4 g/dL (ref 13.0–17.0)
Immature Granulocytes: 0 %
Lymphocytes Relative: 17 %
Lymphs Abs: 1.3 10*3/uL (ref 0.7–4.0)
MCH: 30.8 pg (ref 26.0–34.0)
MCHC: 34 g/dL (ref 30.0–36.0)
MCV: 90.6 fL (ref 80.0–100.0)
Monocytes Absolute: 0.6 10*3/uL (ref 0.1–1.0)
Monocytes Relative: 9 %
Neutro Abs: 4.9 10*3/uL (ref 1.7–7.7)
Neutrophils Relative %: 66 %
Platelets: 181 10*3/uL (ref 150–400)
RBC: 4.68 MIL/uL (ref 4.22–5.81)
RDW: 12.2 % (ref 11.5–15.5)
WBC: 7.4 10*3/uL (ref 4.0–10.5)
nRBC: 0 % (ref 0.0–0.2)

## 2021-05-01 LAB — TROPONIN I (HIGH SENSITIVITY)
Troponin I (High Sensitivity): 7 ng/L (ref <18)
Troponin I (High Sensitivity): 8 ng/L (ref <18)

## 2021-05-01 MED ORDER — IOHEXOL 350 MG/ML SOLN
75.0000 mL | Freq: Once | INTRAVENOUS | Status: AC | PRN
  Administered 2021-05-01: 19:00:00 75 mL via INTRAVENOUS

## 2021-05-01 NOTE — ED Provider Notes (Signed)
Emergency Medicine Provider Triage Evaluation Note  [name] , a 65 y.o. male  was evaluated in triage.  Pt complains of midsternal chest pain.  Patient reports that pain started at 1100 after climbing through a window.  Pain has been constant since patient reports that pain radiates through to his back.  Patient describes pain as sharp and stabbing.  Pain is worse with movement.  Patient denies any associated shortness of breath, nausea, vomiting, diaphoresis.  Patient took 325 mg of aspirin prior to arrival.  Review of Systems  Positive: Chest pain Negative: Nausea, vomiting, diaphoresis, abdominal pain, syncope, near syncope, numbness, weakness  Physical Exam  BP (!) 160/88 (BP Location: Right Arm)   Pulse 67   Temp 98.5 F (36.9 C) (Oral)   Resp 17   Ht 5\' 9"  (1.753 m)   Wt 97.1 kg   SpO2 99%   BMI 31.61 kg/m  Gen:   Awake, no distress   Resp:  Normal effort, lungs clear to auscultation bilaterally MSK:   Moves extremities without difficulty  Other:  +2 radial and carotid pulse bilaterally.  Abdomen soft, nondistended, nontender with no mass or pulsatile  Medical Decision Making  Medically screening exam initiated at 3:55 PM.  Appropriate orders placed.  KEOKI MCHARGUE was informed that the remainder of the evaluation will be completed by another provider, this initial triage assessment does not replace that evaluation, and the importance of remaining in the ED until their evaluation is complete.  The patient appears stable so that the remainder of the work up may be completed by another provider.      Loni Beckwith, PA-C 05/01/21 1557    Charlesetta Shanks, MD 05/02/21 [phone]

## 2021-05-01 NOTE — Telephone Encounter (Signed)
Pt c/o of Chest Pain: STAT if CP now or developed within 24 hours  1. Are you having CP right now? Yes from his back   2. Are you experiencing any other symptoms (ex. SOB, nausea, vomiting, sweating)? no  3. How long have you been experiencing CP? Since Today   4. Is your CP continuous or coming and going? Constant   5. Have you taken Nitroglycerin? No  ?

## 2021-05-01 NOTE — Telephone Encounter (Signed)
Pt reports chest pain with pain radiating from his back to front. He states he was on the roof working on something but not sure that he did anything too strenous. Pt aware that it is hard to say if heart related vs muscle strain, but w/ radiating pain better to be safe than sorry.  Advised to go to ED for evaluation. Pt reports that he has NOT taken any of his nitro, nor taken any Viagra in the last several days.   Pt advised to take nitro now and head to E.D. for further evaluation. Patient verbalized understanding and agreeable to plan.   Hospital cardiology cardmaster notified.

## 2021-05-01 NOTE — ED Triage Notes (Signed)
C/O midsternal chest pain, sharp in nature, radiating through the back, worst with movement. Endorsed hx of stent placement. Stated taken 2 ASA 325 mg PTA.

## 2021-05-01 NOTE — Telephone Encounter (Signed)
Patient is currently in the ED. Will send message to Dr. Johnsie Cancel so his is aware.

## 2021-05-04 NOTE — Progress Notes (Signed)
CARDIOLOGY OFFICE NOTE  Date:  05/13/2021    [name] Date of Birth: 08-Dec-1955 Medical Record #[id number]  PCP:  Susy Frizzle, MD  Cardiologist:  Gillian Shields   No chief complaint on file.   History of Present Illness: [name] is a 65 y.o. male has a history of history of CAD s/p PCI of RCA 2005, PCI of Cx 2007, CABG x 4 in 2014, PCI of OM 03/2018, post-op atrial fib (on amiodarone briefly), OSA on CPAP, hypertension, hyperlipidemia, obesity, GERD, CKD stage III, ongoing tobacco abuse & statin intolerance. Takes crestor some but should probalby be referred for PSK 9   Left heart cath on 04/19/18 which revealed an occluded SVG to circumflex obtuse marginal branch. He had high-grade obtuse marginal branch in-stent restenosis of old stent and a DES was placed to open that stented area with good result. DAPT for minimum of 12 months recommended. Referred to the lipid clinic. Continued to smoke. Lung cancer screening CT negative 11/2019.   Seen in ER 05/01/21 for chest pain started when climbing through window and radiated to back R/O no acute ECG changes CT no dissection chronic focal dissection vs ulcer infra renal Ao stable since 11/22/17 emphysema and bilateral RAS  CXR subsegmental left lung atelectasis D/c home  No issues since d/c Needs new nitro Has some cervicle ankylosis anterioroly with stiffness Has new 22 rifle that he likes     Past Medical History:  Diagnosis Date   Cervical spine ankylosis    CKD (chronic kidney disease) stage 3, GFR 30-59 ml/min (HCC)    COLONIC POLYPS, HX OF    CORONARY ARTERY DISEASE    a. s/p stent to RCA 2005. b. Stent to Cx 2007. c.  CABG X4 2014; d. LHC 04/19/18 occluded SVG-Circ, DES to in-stent restenosis of circ.    Diverticulosis    DIVERTICULOSIS, COLON    DYSPHAGIA UNSPECIFIED    intermittent   GERD    HEMORRHOIDS    HYPERLIPIDEMIA    HYPERTENSION    HYPERTRIGLYCERIDEMIA    Left carotid artery occlusion     OBESITY    Postoperative atrial fibrillation ([place]) 2014   SLEEP APNEA    on CPAP   Statin intolerance    Tobacco abuse    Vertebral artery occlusion, left     Past Surgical History:  Procedure Laterality Date   ACHILLES TENDON REPAIR     BACK SURGERY     lower back   CARDIAC CATHETERIZATION     CORONARY ARTERY BYPASS GRAFT  08/18/2012   Procedure: CORONARY ARTERY BYPASS GRAFTING (CABG);  Surgeon: Gaye Pollack, MD;  Location: [facility];  Service: Open Heart Surgery;  Laterality: N/A;  CABG x four;  using left internal mammary artery and right leg greater saphenous vein harvested endoscopically   CORONARY STENT INTERVENTION N/A 04/19/2018   Procedure: CORONARY STENT INTERVENTION;  Surgeon: Lorretta Harp, MD;  Location: [facility];  Service: Cardiovascular;  Laterality: N/A;   KNEE ARTHROSCOPY     RIGHT   LEFT HEART CATH AND CORS/GRAFTS ANGIOGRAPHY N/A 04/19/2018   Procedure: LEFT HEART CATH AND CORS/GRAFTS ANGIOGRAPHY;  Surgeon: Lorretta Harp, MD;  Location: [facility];  Service: Cardiovascular;  Laterality: N/A;   LUMBAR LAMINECTOMY/DECOMPRESSION MICRODISCECTOMY N/A 12/05/2015   Procedure: LUMBAR 2-3, LUMBAR 3-4 DECOMPRESSION ;  Surgeon: Phylliss Bob, MD;  Location: [facility];  Service: Orthopedics;  Laterality: N/A;   Right long finger  extensor tendon repair       Medications: Current Meds  Medication Sig   aspirin EC 81 MG tablet Take 1 tablet (81 mg total) by mouth daily.   clopidogrel (PLAVIX) 75 MG tablet TAKE 1 TABLET BY MOUTH ONCE A DAY   HYDROmorphone (DILAUDID) 2 MG tablet Take 1 tablet (2 mg total) by mouth every 4 (four) hours as needed for severe pain.   losartan-hydrochlorothiazide (HYZAAR) 100-12.5 MG tablet TAKE 1 TABLET BY MOUTH ONCE A DAY   methocarbamol (ROBAXIN) 500 MG tablet Take 1 tablet (500 mg total) by mouth 4 (four) times daily.   pantoprazole (PROTONIX) 40 MG tablet TAKE 1 TABLET BY MOUTH ONCE A DAY   rosuvastatin (CRESTOR) 5 MG tablet  Take 5 mg by mouth 2 (two) times a week.   sildenafil (VIAGRA) 100 MG tablet Take 0.5-1 tablets (50-100 mg total) by mouth daily as needed for erectile dysfunction.   [DISCONTINUED] nitroGLYCERIN (NITROSTAT) 0.4 MG SL tablet Place 1 tablet (0.4 mg total) under the tongue every 5 (five) minutes as needed for chest pain.     Allergies: Allergies  Allergen Reactions   Statins Other (See Comments)    Bone and muscle pain   Oxycodone Other (See Comments)   Hydrocodone Nausea And Vomiting    Social History: The patient  reports that he has been smoking cigarettes. He has a 64.50 pack-year smoking history. He has never used smokeless tobacco. He reports current alcohol use. He reports that he does not use drugs.   Family History: The patient's family history includes Cancer in his mother; Heart disease in his brother.   Review of Systems: Please see the history of present illness.   All other systems are reviewed and negative.   Physical Exam: VS:  BP (!) 150/80   Pulse 78   Ht 5\' 9"  (1.753 m)   Wt 97.9 kg   SpO2 98%   BMI 31.87 kg/m  .  BMI Body mass index is 31.87 kg/m.  Wt Readings from Last 3 Encounters:  05/13/21 97.9 kg  05/01/21 97.1 kg  03/26/21 97.1 kg    General: Alert and in no acute distress. He is down 12 pounds.  Cardiac: Regular rate and rhythm. No murmurs, rubs, or gallops. No edema.  Respiratory:  Lungs are clear to auscultation bilaterally with normal work of breathing.  GI: Soft and nontender.  MS: No deformity or atrophy. Gait and ROM intact.  Skin: Warm and dry. Color is normal.  Neuro:  Strength and sensation are intact and no gross focal deficits noted.  Psych: Alert, appropriate and with normal affect.   LABORATORY DATA:  EKG:  EKG is ordered today.  Personally reviewed by me. This demonstrates NSR - HR is 77.  Lab Results  Component Value Date   WBC 7.4 05/01/2021   HGB 14.4 05/01/2021   HCT 42.4 05/01/2021   PLT 181 05/01/2021   GLUCOSE  94 05/01/2021   CHOL 136 08/03/2020   TRIG 114 08/03/2020   HDL 42 08/03/2020   LDLDIRECT 77.2 07/10/2009   LDLCALC 73 08/03/2020   ALT 21 05/01/2021   AST 21 05/01/2021   NA 137 05/01/2021   K 3.9 05/01/2021   CL 105 05/01/2021   CREATININE 1.80 (H) 05/01/2021   BUN 24 (H) 05/01/2021   CO2 21 (L) 05/01/2021   INR 1.08 03/10/2017   HGBA1C 5.5 11/18/2018     BNP (last 3 results) No results for input(s): BNP in the last 8760  hours.  ProBNP (last 3 results) No results for input(s): PROBNP in the last 8760 hours.   Other Studies Reviewed Today:  CORONARY STENT INTERVENTION 03/2018  LEFT HEART CATH AND CORS/GRAFTS ANGIOGRAPHY  Conclusion      Mid RCA to Dist RCA lesion is 100% stenosed. Ost LAD to Prox LAD lesion is 100% stenosed. Prox RCA to Mid RCA lesion is 75% stenosed. Prox Cx to Mid Cx lesion is 95% stenosed. Origin lesion is 100% stenosed. A stent was successfully placed. Post intervention, there is a 0% residual stenosis. LV end diastolic pressure is mildly elevated. The left ventricular ejection fraction is 50-55% by visual estimate. The left ventricular systolic function is normal.            Assessment/Plan:  1. CAD - has had multiple caths/PCI/CABG and last was PCI in June of 2019 - to occluded SVG to LCX/OM branch - had high grade OM branch ISR of the old stent - on lifelong DAPT.  Myovue 08/03/20 EF 67% normal no ischemia  Seen in ED 05/01/21 with chest pain R/O New nitro called in Observe   2. HTN - BP is ok - would continue on current regimen.   3. HLD - on statin - does have some tolerability issues - LDL 73 08/03/20 f/u labs primary   4. Tobacco abuse - with emphysema  - he is not ready to stop yet.   Current medicines are reviewed with the patient today.  The patient does not have concerns regarding medicines other than what has been noted above.  The following changes have been made:  See above.  Labs/ tests ordered today include:    No  orders of the defined types were placed in this encounter.    Disposition:   f/u 6 months    Patient is agreeable to this plan and will call if any problems develop in the interim.   Signed: Jenkins Rouge, MD  05/13/2021 4:37 PM  [facility] [address] [place], [place]  [address] Phone: [phone] Fax: [phone]

## 2021-05-13 ENCOUNTER — Ambulatory Visit: Payer: BC Managed Care – PPO | Admitting: Cardiovascular Disease

## 2021-05-13 ENCOUNTER — Encounter: Payer: Self-pay | Admitting: Cardiovascular Disease

## 2021-05-13 ENCOUNTER — Other Ambulatory Visit: Payer: Self-pay

## 2021-05-13 VITALS — BP 150/80 | HR 78 | Ht 69.0 in | Wt 215.8 lb

## 2021-05-13 DIAGNOSIS — I10 Essential (primary) hypertension: Secondary | ICD-10-CM | POA: Diagnosis not present

## 2021-05-13 DIAGNOSIS — E782 Mixed hyperlipidemia: Secondary | ICD-10-CM | POA: Diagnosis not present

## 2021-05-13 DIAGNOSIS — F17200 Nicotine dependence, unspecified, uncomplicated: Secondary | ICD-10-CM | POA: Diagnosis not present

## 2021-05-13 DIAGNOSIS — I2510 Atherosclerotic heart disease of native coronary artery without angina pectoris: Secondary | ICD-10-CM | POA: Diagnosis not present

## 2021-05-13 MED ORDER — NITROGLYCERIN 0.4 MG SL SUBL: 0.4000 mg | SUBLINGUAL_TABLET | SUBLINGUAL | 2 refills | Status: DC | PRN

## 2021-05-13 NOTE — Patient Instructions (Signed)
Medication Instructions:  *If you need a refill on your cardiac medications before your next appointment, please call your pharmacy*  Lab Work: If you have labs (blood work) drawn today and your tests are completely normal, you will receive your results only by: MyChart Message (if you have MyChart) OR A paper copy in the mail If you have any lab test that is abnormal or we need to change your treatment, we will call you to review the results.  Testing/Procedures: None ordered today.  Follow-Up: At [facility], you and your health needs are our priority.  As part of our continuing mission to provide you with exceptional heart care, we have created designated Provider Care Teams.  These Care Teams include your primary Cardiologist (physician) and Advanced Practice Providers (APPs -  Physician Assistants and Nurse Practitioners) who all work together to provide you with the care you need, when you need it.  We recommend signing up for the patient portal called "MyChart".  Sign up information is provided on this After Visit Summary.  MyChart is used to connect with patients for Virtual Visits (Telemedicine).  Patients are able to view lab/test results, encounter notes, upcoming appointments, etc.  Non-urgent messages can be sent to your provider as well.   To learn more about what you can do with MyChart, go to [email or web address].    Your next appointment:   6 month(s)  The format for your next appointment:   In Person  Provider:   You may see Peter Nishan, MD or one of the following Advanced Practice Providers on your designated Care Team:   Laura Ingold, NP  

## 2021-06-19 ENCOUNTER — Other Ambulatory Visit: Payer: Self-pay

## 2021-06-19 ENCOUNTER — Encounter: Payer: Self-pay | Admitting: Nurse Practitioner

## 2021-06-19 ENCOUNTER — Telehealth (INDEPENDENT_AMBULATORY_CARE_PROVIDER_SITE_OTHER): Payer: BC Managed Care – PPO | Admitting: Nurse Practitioner

## 2021-06-19 VITALS — BP 155/76 | Temp 98.8°F | Ht 69.0 in | Wt 215.0 lb

## 2021-06-19 DIAGNOSIS — K529 Noninfective gastroenteritis and colitis, unspecified: Secondary | ICD-10-CM

## 2021-06-19 NOTE — Progress Notes (Signed)
Subjective:    Patient ID: [name], male    DOB: [birth year], 65 y.o.   MRN: [id number]  HPI: [name] is a 65 y.o. male presenting virtually for diarrhea.  Chief Complaint  Patient presents with   Diarrhea   Dizziness   Hypertension   GASTROENTERITIS Duration: day Diarrhea: yes non-bloody  Episodes of diarrhea/day: 5-6x Description of diarrhea: Nausea: no Vomiting: no Episodes of vomit/day: 0 Description of vomiting: 0 Abdominal pain: no Fever: no Cold chills: yes Decreased appetite: yes Loss of taste or smell: no Indigestion: yes; gone now Tolerating liquids: no Foreign travel: no Relevant dietary history: nothing changed Headache: yes Similar illness in contacts: no Recent antibiotic use: no Status: better Treatments attempted: immodium  Reports this morning before medication and after drinking coffee and being up all night with diarrhea, blood pressure was 200/90.  Blood pressure rechecked after medicine 155/76.  No chest pain or shortness of breath.  Allergies  Allergen Reactions   Statins Other (See Comments)    Bone and muscle pain   Oxycodone Other (See Comments)   Hydrocodone Nausea And Vomiting    Outpatient Encounter Medications as of 06/19/2021  Medication Sig   aspirin EC 81 MG tablet Take 1 tablet (81 mg total) by mouth daily.   clopidogrel (PLAVIX) 75 MG tablet TAKE 1 TABLET BY MOUTH ONCE A DAY   HYDROmorphone (DILAUDID) 2 MG tablet Take 1 tablet (2 mg total) by mouth every 4 (four) hours as needed for severe pain.   losartan-hydrochlorothiazide (HYZAAR) 100-12.5 MG tablet TAKE 1 TABLET BY MOUTH ONCE A DAY   methocarbamol (ROBAXIN) 500 MG tablet Take 1 tablet (500 mg total) by mouth 4 (four) times daily.   nitroGLYCERIN (NITROSTAT) 0.4 MG SL tablet Place 1 tablet (0.4 mg total) under the tongue every 5 (five) minutes as needed for chest pain.   pantoprazole (PROTONIX) 40 MG tablet TAKE 1 TABLET BY MOUTH ONCE A DAY   rosuvastatin  (CRESTOR) 5 MG tablet Take 5 mg by mouth 2 (two) times a week.   sildenafil (VIAGRA) 100 MG tablet Take 0.5-1 tablets (50-100 mg total) by mouth daily as needed for erectile dysfunction.   No facility-administered encounter medications on file as of 06/19/2021.    Patient Active Problem List   Diagnosis Date Noted   Cervical spine ankylosis    Left carotid artery occlusion    Vertebral artery occlusion, left    Mixed hyperlipidemia 05/07/2018   CAD (coronary artery disease) 04/19/2018   Unstable angina ([place]) 04/16/2018   CKD (chronic kidney disease) stage 3, GFR 30-59 ml/min (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[place]) 12/05/2015   PAF (paroxysmal atrial fibrillation) ([place]) 09/10/2012   Tobacco abuse 07/01/2012   HYPERTRIGLYCERIDEMIA 06/19/2009   Elevated lipids 06/19/2009   OBESITY 06/19/2009   Essential hypertension 06/19/2009   Chronic bronchitis ([place]) 06/19/2009   CHEST PAIN 06/19/2009   DYSPHAGIA UNSPECIFIED 06/19/2009   Coronary atherosclerosis 03/01/2008   HEMORRHOIDS 03/01/2008   GERD 03/01/2008   DIVERTICULOSIS, COLON 03/01/2008   Obstructive sleep apnea 03/01/2008   COLONIC POLYPS, HX OF 03/01/2008    Past Medical History:  Diagnosis Date   Cervical spine ankylosis    CKD (chronic kidney disease) stage 3, GFR 30-59 ml/min (HCC)    COLONIC POLYPS, HX OF    CORONARY ARTERY DISEASE    a. s/p stent to RCA 2005. b. Stent to Cx 2007. c.  CABG X4 2014; d. LHC 04/19/18 occluded SVG-Circ, DES to  in-stent restenosis of circ.    Diverticulosis    DIVERTICULOSIS, COLON    DYSPHAGIA UNSPECIFIED    intermittent   GERD    HEMORRHOIDS    HYPERLIPIDEMIA    HYPERTENSION    HYPERTRIGLYCERIDEMIA    Left carotid artery occlusion    OBESITY    Postoperative atrial fibrillation ([place]) 2014   SLEEP APNEA    on CPAP   Statin intolerance    Tobacco abuse    Vertebral artery occlusion, left     Relevant past medical, surgical, family and social history reviewed and updated  as indicated. Interim medical history since our last visit reviewed.  Review of Systems Per HPI unless specifically indicated above     Objective:    BP (!) 155/76   Temp 98.8 F (37.1 C)   Ht '5\' 9"'$  (1.753 m)   Wt 215 lb (97.5 kg)   BMI 31.75 kg/m   Wt Readings from Last 3 Encounters:  06/19/21 215 lb (97.5 kg)  05/13/21 215 lb 12.8 oz (97.9 kg)  05/01/21 214 lb 1.1 oz (97.1 kg)    Physical Exam Unable to perform physical examination due to lack of equipment.  Patient talking in complete sentences during telemedicine visit.     Assessment & Plan:  1. Gastroenteritis Acute x 1 day.  Likely viral in etiology - discussed eating soft diet, pushing clear fluids, and continue symptomatic management.  Encouraged COVID testing, however patient politely declines for now.  If symptoms persist >48 hours, follow up for in-person visit. With any sudden onset new chest pain, dizziness,  or shortness of breath, go to ED.  Follow up plan: Return if symptoms worsen or fail to improve.  This visit was completed via telephone due to the restrictions of the COVID-19 pandemic. All issues as above were discussed and addressed but no physical exam was performed. If it was felt that the patient should be evaluated in the office, they were directed there. The patient verbally consented to this visit. Patient was unable to complete an audio/visual visit due to Technical difficulties.  Link sent to patient and error message received, unable to download information needed to start video visit. Location of the patient: home Location of the provider: work Those involved with this call:  Provider: Noemi Chapel, DNP, FNP-C CMA: Loni Muse, CMA Front Desk/Registration: Vevelyn Pat  Time spent on call:  14 minutes on the phone discussing health concerns. 10 minutes total spent in review of patient's record and preparation of their chart. I verified patient identity using two factors (patient  name and date of birth). Patient consents verbally to being seen via telemedicine visit today.

## 2021-06-20 ENCOUNTER — Other Ambulatory Visit: Payer: Self-pay

## 2021-06-20 ENCOUNTER — Ambulatory Visit (INDEPENDENT_AMBULATORY_CARE_PROVIDER_SITE_OTHER): Payer: BC Managed Care – PPO | Admitting: Family Medicine

## 2021-06-20 ENCOUNTER — Encounter: Payer: Self-pay | Admitting: Family Medicine

## 2021-06-20 VITALS — BP 120/58 | HR 88 | Temp 97.5°F | Resp 16 | Ht 69.0 in | Wt 211.0 lb

## 2021-06-20 DIAGNOSIS — I2510 Atherosclerotic heart disease of native coronary artery without angina pectoris: Secondary | ICD-10-CM

## 2021-06-20 DIAGNOSIS — K5792 Diverticulitis of intestine, part unspecified, without perforation or abscess without bleeding: Secondary | ICD-10-CM

## 2021-06-20 DIAGNOSIS — R509 Fever, unspecified: Secondary | ICD-10-CM | POA: Diagnosis not present

## 2021-06-20 LAB — CBC WITH DIFFERENTIAL/PLATELET
Absolute Monocytes: 887 cells/uL (ref 200–950)
Basophils Absolute: 61 cells/uL (ref 0–200)
Basophils Relative: 0.6 %
Eosinophils Absolute: 31 cells/uL (ref 15–500)
Eosinophils Relative: 0.3 %
HCT: 43.2 % (ref 38.5–50.0)
Hemoglobin: 14.7 g/dL (ref 13.2–17.1)
Lymphs Abs: 714 cells/uL — ABNORMAL LOW (ref 850–3900)
MCH: 30.9 pg (ref 27.0–33.0)
MCHC: 34 g/dL (ref 32.0–36.0)
MCV: 90.9 fL (ref 80.0–100.0)
MPV: 10.5 fL (ref 7.5–12.5)
Monocytes Relative: 8.7 %
Neutro Abs: 8507 cells/uL — ABNORMAL HIGH (ref 1500–7800)
Neutrophils Relative %: 83.4 %
Platelets: 152 10*3/uL (ref 140–400)
RBC: 4.75 10*6/uL (ref 4.20–5.80)
RDW: 12.3 % (ref 11.0–15.0)
Total Lymphocyte: 7 %
WBC: 10.2 10*3/uL (ref 3.8–10.8)

## 2021-06-20 LAB — COMPLETE METABOLIC PANEL WITH GFR
AG Ratio: 1.6 (calc) (ref 1.0–2.5)
ALT: 20 U/L (ref 9–46)
AST: 19 U/L (ref 10–35)
Albumin: 4.1 g/dL (ref 3.6–5.1)
Alkaline phosphatase (APISO): 48 U/L (ref 35–144)
BUN/Creatinine Ratio: 13 (calc) (ref 6–22)
BUN: 30 mg/dL — ABNORMAL HIGH (ref 7–25)
CO2: 20 mmol/L (ref 20–32)
Calcium: 9 mg/dL (ref 8.6–10.3)
Chloride: 104 mmol/L (ref 98–110)
Creat: 2.39 mg/dL — ABNORMAL HIGH (ref 0.70–1.35)
Globulin: 2.5 g/dL (calc) (ref 1.9–3.7)
Glucose, Bld: 99 mg/dL (ref 65–99)
Potassium: 4.1 mmol/L (ref 3.5–5.3)
Sodium: 132 mmol/L — ABNORMAL LOW (ref 135–146)
Total Bilirubin: 1 mg/dL (ref 0.2–1.2)
Total Protein: 6.6 g/dL (ref 6.1–8.1)
eGFR: 29 mL/min/{1.73_m2} — ABNORMAL LOW (ref >=60)

## 2021-06-20 MED ORDER — AMOXICILLIN-POT CLAVULANATE 875-125 MG PO TABS: 1.0000 | ORAL_TABLET | Freq: Two times a day (BID) | ORAL | 0 refills | Status: DC

## 2021-06-20 NOTE — Progress Notes (Signed)
Subjective:    Patient ID: [name], male    DOB: 02/03/1956, 65 y.o.   MRN: [id number]  HPI Patient is a very pleasant 65 year old Caucasian gentleman who has a history of diverticulosis and diverticulitis.  Symptoms began on Monday.  He developed severe diarrhea and crampy abdominal pain.  He also reports fevers and chills.  He now reports left-sided abdominal pain specifically in the left lower quadrant.  He is tender to palpation in that area.  He has hyperactive bowel sounds.  He denies any melena or hematochezia.  He denies any cough or shortness of breath or chest pain.  He had a negative COVID test yesterday at home. Past Medical History:  Diagnosis Date   Cervical spine ankylosis    CKD (chronic kidney disease) stage 3, GFR 30-59 ml/min (HCC)    COLONIC POLYPS, HX OF    CORONARY ARTERY DISEASE    a. s/p stent to RCA 2005. b. Stent to Cx 2007. c.  CABG X4 2014; d. LHC 04/19/18 occluded SVG-Circ, DES to in-stent restenosis of circ.    Diverticulosis    DIVERTICULOSIS, COLON    DYSPHAGIA UNSPECIFIED    intermittent   GERD    HEMORRHOIDS    HYPERLIPIDEMIA    HYPERTENSION    HYPERTRIGLYCERIDEMIA    Left carotid artery occlusion    OBESITY    Postoperative atrial fibrillation ([place]) 2014   SLEEP APNEA    on CPAP   Statin intolerance    Tobacco abuse    Vertebral artery occlusion, left    Past Surgical History:  Procedure Laterality Date   ACHILLES TENDON REPAIR     BACK SURGERY     lower back   CARDIAC CATHETERIZATION     CORONARY ARTERY BYPASS GRAFT  08/18/2012   Procedure: CORONARY ARTERY BYPASS GRAFTING (CABG);  Surgeon: Gaye Pollack, MD;  Location: [facility];  Service: Open Heart Surgery;  Laterality: N/A;  CABG x four;  using left internal mammary artery and right leg greater saphenous vein harvested endoscopically   CORONARY STENT INTERVENTION N/A 04/19/2018   Procedure: CORONARY STENT INTERVENTION;  Surgeon: Lorretta Harp, MD;  Location: [facility];   Service: Cardiovascular;  Laterality: N/A;   KNEE ARTHROSCOPY     RIGHT   LEFT HEART CATH AND CORS/GRAFTS ANGIOGRAPHY N/A 04/19/2018   Procedure: LEFT HEART CATH AND CORS/GRAFTS ANGIOGRAPHY;  Surgeon: Lorretta Harp, MD;  Location: [facility];  Service: Cardiovascular;  Laterality: N/A;   LUMBAR LAMINECTOMY/DECOMPRESSION MICRODISCECTOMY N/A 12/05/2015   Procedure: LUMBAR 2-3, LUMBAR 3-4 DECOMPRESSION ;  Surgeon: Phylliss Bob, MD;  Location: [facility];  Service: Orthopedics;  Laterality: N/A;   Right long finger extensor tendon repair     Current Outpatient Medications on File Prior to Visit  Medication Sig Dispense Refill   aspirin EC 81 MG tablet Take 1 tablet (81 mg total) by mouth daily. 90 tablet 3   clopidogrel (PLAVIX) 75 MG tablet TAKE 1 TABLET BY MOUTH ONCE A DAY 90 tablet 1   HYDROmorphone (DILAUDID) 2 MG tablet Take 1 tablet (2 mg total) by mouth every 4 (four) hours as needed for severe pain. 30 tablet 0   losartan-hydrochlorothiazide (HYZAAR) 100-12.5 MG tablet TAKE 1 TABLET BY MOUTH ONCE A DAY 90 tablet 2   methocarbamol (ROBAXIN) 500 MG tablet Take 1 tablet (500 mg total) by mouth 4 (four) times daily. 30 tablet 0   nitroGLYCERIN (NITROSTAT) 0.4 MG SL tablet Place 1 tablet (  0.4 mg total) under the tongue every 5 (five) minutes as needed for chest pain. 25 tablet 2   pantoprazole (PROTONIX) 40 MG tablet TAKE 1 TABLET BY MOUTH ONCE A DAY 90 tablet 2   rosuvastatin (CRESTOR) 5 MG tablet Take 5 mg by mouth 2 (two) times a week.     sildenafil (VIAGRA) 100 MG tablet Take 0.5-1 tablets (50-100 mg total) by mouth daily as needed for erectile dysfunction. 5 tablet 11   No current facility-administered medications on file prior to visit.   Allergies  Allergen Reactions   Statins Other (See Comments)    Bone and muscle pain   Oxycodone Other (See Comments)   Hydrocodone Nausea And Vomiting   Social History   Socioeconomic History   Marital status: Married    Spouse name:  Not on file   Number of children: Not on file   Years of education: Not on file   Highest education level: Not on file  Occupational History   Not on file  Tobacco Use   Smoking status: Every Day    Packs/day: 1.50    Years: 43.00    Pack years: 64.50    Types: Cigarettes   Smokeless tobacco: Never   Tobacco comments:    Patient attempting to quit pt down to 1 ppd  Vaping Use   Vaping Use: Never used  Substance and Sexual Activity   Alcohol use: Yes    Comment: 1-2 drink occasionally   Drug use: No   Sexual activity: Not on file  Other Topics Concern   Not on file  Social History Narrative   Not on file   Social Determinants of Health   Financial Resource Strain: Not on file  Food Insecurity: Not on file  Transportation Needs: Not on file  Physical Activity: Not on file  Stress: Not on file  Social Connections: Not on file  Intimate Partner Violence: Not on file      Review of Systems  All other systems reviewed and are negative.     Objective:   Physical Exam Vitals reviewed.  Constitutional:      General: He is not in acute distress.    Appearance: Normal appearance. He is well-developed. He is obese. He is ill-appearing. He is not toxic-appearing.  Cardiovascular:     Rate and Rhythm: Normal rate and regular rhythm.     Heart sounds: Normal heart sounds.  Pulmonary:     Effort: Pulmonary effort is normal. No respiratory distress.     Breath sounds: Normal breath sounds. No wheezing or rales.  Abdominal:     General: Bowel sounds are increased. There is no distension.     Palpations: Abdomen is soft. Abdomen is not rigid.     Tenderness: There is abdominal tenderness in the left lower quadrant. There is no guarding or rebound. Negative signs include Murphy's sign and McBurney's sign.    Neurological:     Mental Status: He is alert.          Assessment & Plan:  Fever, unspecified fever cause - Plan: CBC with Differential/Platelet, COMPLETE  METABOLIC PANEL WITH GFR, CANCELED: SARS-COV-2 RNA,(COVID-19) QUAL NAAT  Diverticulitis I suspect diverticulitis.  Begin Augmentin 875 mg twice daily for 10 days.  Push fluids such as Gatorade.  If he continues to have copious watery diarrhea, I have advised him to hold his blood pressure medication to avoid dehydration.  He can use Imodium for diarrhea as well to avoid dehydration.  Obtain  CBC CMP.  Canceled COVID test because he had a negative test at home

## 2021-06-21 ENCOUNTER — Other Ambulatory Visit: Payer: Self-pay | Admitting: *Deleted

## 2021-06-21 DIAGNOSIS — E860 Dehydration: Secondary | ICD-10-CM

## 2021-06-24 ENCOUNTER — Other Ambulatory Visit: Payer: Medicare Other

## 2021-06-24 ENCOUNTER — Other Ambulatory Visit: Payer: Self-pay

## 2021-06-24 ENCOUNTER — Encounter: Payer: Self-pay | Admitting: Family Medicine

## 2021-06-24 DIAGNOSIS — E860 Dehydration: Secondary | ICD-10-CM

## 2021-06-25 ENCOUNTER — Other Ambulatory Visit: Payer: Self-pay | Admitting: Family Medicine

## 2021-06-25 LAB — COMPLETE METABOLIC PANEL WITH GFR
AG Ratio: 1.6 (calc) (ref 1.0–2.5)
ALT: 56 U/L — ABNORMAL HIGH (ref 9–46)
AST: 49 U/L — ABNORMAL HIGH (ref 10–35)
Albumin: 3.8 g/dL (ref 3.6–5.1)
Alkaline phosphatase (APISO): 83 U/L (ref 35–144)
BUN/Creatinine Ratio: 14 (calc) (ref 6–22)
BUN: 23 mg/dL (ref 7–25)
CO2: 22 mmol/L (ref 20–32)
Calcium: 8.7 mg/dL (ref 8.6–10.3)
Chloride: 103 mmol/L (ref 98–110)
Creat: 1.6 mg/dL — ABNORMAL HIGH (ref 0.70–1.35)
Globulin: 2.4 g/dL (calc) (ref 1.9–3.7)
Glucose, Bld: 111 mg/dL — ABNORMAL HIGH (ref 65–99)
Potassium: 4.1 mmol/L (ref 3.5–5.3)
Sodium: 134 mmol/L — ABNORMAL LOW (ref 135–146)
Total Bilirubin: 0.6 mg/dL (ref 0.2–1.2)
Total Protein: 6.2 g/dL (ref 6.1–8.1)
eGFR: 47 mL/min/{1.73_m2} — ABNORMAL LOW (ref >=60)

## 2021-06-25 MED ORDER — DIPHENOXYLATE-ATROPINE 2.5-0.025 MG PO TABS
2.0000 | ORAL_TABLET | Freq: Four times a day (QID) | ORAL | 0 refills | Status: DC | PRN
Start: 2021-06-25 — End: 2022-08-26

## 2021-06-25 NOTE — Telephone Encounter (Signed)
Call placed to patient.   Patient reports that he is going to try to get through today, but will call in AM to notify office if there are any changes.   Of note, patient reports that he had 1 small loose BM this morning and is feeling like he may be becoming constipated.   Advised to hold Lomotil if no true loose watery stools noted.

## 2021-06-26 ENCOUNTER — Telehealth: Payer: Self-pay | Admitting: Family Medicine

## 2021-06-26 ENCOUNTER — Other Ambulatory Visit: Payer: Self-pay

## 2021-06-26 ENCOUNTER — Ambulatory Visit
Admission: RE | Admit: 2021-06-26 | Discharge: 2021-06-26 | Disposition: A | Discharge status: Home / Self Care | Payer: BC Managed Care – PPO | Source: Ambulatory Visit | Attending: Family Medicine | Admitting: Family Medicine

## 2021-06-26 DIAGNOSIS — R109 Unspecified abdominal pain: Secondary | ICD-10-CM

## 2021-06-26 DIAGNOSIS — Z8719 Personal history of other diseases of the digestive system: Secondary | ICD-10-CM

## 2021-06-26 DIAGNOSIS — R1010 Upper abdominal pain, unspecified: Secondary | ICD-10-CM

## 2021-06-26 DIAGNOSIS — R197 Diarrhea, unspecified: Secondary | ICD-10-CM

## 2021-06-26 DIAGNOSIS — K529 Noninfective gastroenteritis and colitis, unspecified: Secondary | ICD-10-CM

## 2021-06-26 MED ORDER — METRONIDAZOLE 500 MG PO TABS: 500.0000 mg | ORAL_TABLET | Freq: Two times a day (BID) | ORAL | 0 refills | Status: AC

## 2021-06-26 MED ORDER — CIPROFLOXACIN HCL 500 MG PO TABS: 500.0000 mg | ORAL_TABLET | Freq: Two times a day (BID) | ORAL | 0 refills | Status: AC

## 2021-06-26 MED ORDER — IOPAMIDOL (ISOVUE-300) INJECTION 61%
100.0000 mL | Freq: Once | INTRAVENOUS | Status: AC | PRN
  Administered 2021-06-26: 100 mL via INTRAVENOUS

## 2021-06-26 NOTE — Telephone Encounter (Signed)
Received call report for CT.   IMPRESSION: 1. Acute uncomplicated sigmoid diverticulitis. 2. 6 mm nonobstructing left renal stone 3. Aortic Atherosclerosis  PCP made aware and new orders obtained as follows: If Sx worsening with pain/ diarrhea, go to ER for evaluation. If patient is stable begin Cipro '500mg'$  PO BID x10 days and Flagyl '500mg'$  PO BID x10 days.  Stop Augmentin.   Patient reports that he does not think he needs to be seen in ER.   Prescriptions sent to pharmacy. Also advised to add probiotic after ABTx.

## 2021-06-26 NOTE — Addendum Note (Signed)
Addended by: Sheral Flow on: 06/26/2021 03:07 PM   Modules accepted: Orders

## 2021-06-26 NOTE — Telephone Encounter (Signed)
Returned call to patient.   Reports that loose watery stools have returned.   Per PCP, CT ordered.

## 2021-06-26 NOTE — Telephone Encounter (Signed)
Patient returned call to nurse. Please advise at [phone].

## 2021-07-03 ENCOUNTER — Encounter: Payer: Self-pay | Admitting: *Deleted

## 2021-07-22 ENCOUNTER — Other Ambulatory Visit: Payer: Self-pay | Admitting: Cardiovascular Disease

## 2021-07-22 ENCOUNTER — Encounter: Payer: Self-pay | Admitting: Family Medicine

## 2021-07-22 DIAGNOSIS — N183 Chronic kidney disease, stage 3 unspecified: Secondary | ICD-10-CM

## 2021-07-22 DIAGNOSIS — I10 Essential (primary) hypertension: Secondary | ICD-10-CM

## 2021-07-22 DIAGNOSIS — I2510 Atherosclerotic heart disease of native coronary artery without angina pectoris: Secondary | ICD-10-CM

## 2021-07-22 DIAGNOSIS — E782 Mixed hyperlipidemia: Secondary | ICD-10-CM

## 2021-07-25 ENCOUNTER — Ambulatory Visit: Payer: BC Managed Care – PPO | Admitting: Family Medicine

## 2021-07-25 ENCOUNTER — Ambulatory Visit (INDEPENDENT_AMBULATORY_CARE_PROVIDER_SITE_OTHER): Payer: Medicare Other | Admitting: Family Medicine

## 2021-07-25 ENCOUNTER — Other Ambulatory Visit: Payer: Self-pay

## 2021-07-25 VITALS — BP 158/78 | HR 75 | Temp 98.2°F | Resp 16 | Wt 205.0 lb

## 2021-07-25 DIAGNOSIS — I2510 Atherosclerotic heart disease of native coronary artery without angina pectoris: Secondary | ICD-10-CM

## 2021-07-25 DIAGNOSIS — K5791 Diverticulosis of intestine, part unspecified, without perforation or abscess with bleeding: Secondary | ICD-10-CM

## 2021-07-25 NOTE — Progress Notes (Signed)
Subjective:    Patient ID: [name], male    DOB: 16-Nov-1955, 65 y.o.   MRN: [id number]  HPI 06/20/21 Patient is a very pleasant 65 year old Caucasian gentleman who has a history of diverticulosis and diverticulitis.  Symptoms began on Monday.  He developed severe diarrhea and crampy abdominal pain.  He also reports fevers and chills.  He now reports left-sided abdominal pain specifically in the left lower quadrant.  He is tender to palpation in that area.  He has hyperactive bowel sounds.  He denies any melena or hematochezia.  He denies any cough or shortness of breath or chest pain.  He had a negative COVID test yesterday at home.  At that time, my plan was:  I suspect diverticulitis.  Begin Augmentin 875 mg twice daily for 10 days.  Push fluids such as Gatorade.  If he continues to have copious watery diarrhea, I have advised him to hold his blood pressure medication to avoid dehydration.  He can use Imodium for diarrhea as well to avoid dehydration.  Obtain CBC CMP.  Canceled COVID test because he had a negative test at home  CT scan 8/31 revealed:   IMPRESSION: 1. Acute uncomplicated sigmoid diverticulitis. 2. 6 mm nonobstructing left renal stone 3. Aortic Atherosclerosis (ICD10-I70.0).  07/25/21 Saturday, the patient had 1 episode of bright red blood per rectum.  He states that it occurred unprovoked.  His abdominal pain from diverticulitis has been better for the last 2 weeks although he did have to take Cipro and Flagyl after initially he worsened on Augmentin.  He is on aspirin and Plavix although his last cardiac stent was over 3 years ago.  He reports that he went to the bathroom.  There was no crampy abdominal pain.  There was no fevers.  He had a normal bowel movement, but the toilet bowl looks like red Kool-Aid.  He has not had any bleeding since Saturday Past Medical History:  Diagnosis Date  . Cervical spine ankylosis   . CKD (chronic kidney disease) stage 3, GFR 30-59  ml/min (HCC)   . COLONIC POLYPS, HX OF   . CORONARY ARTERY DISEASE    a. s/p stent to RCA 2005. b. Stent to Cx 2007. c.  CABG X4 2014; d. LHC 04/19/18 occluded SVG-Circ, DES to in-stent restenosis of circ.   . Diverticulosis   . DIVERTICULOSIS, COLON   . DYSPHAGIA UNSPECIFIED    intermittent  . GERD   . HEMORRHOIDS   . HYPERLIPIDEMIA   . HYPERTENSION   . HYPERTRIGLYCERIDEMIA   . Left carotid artery occlusion   . OBESITY   . Postoperative atrial fibrillation ([place]) 2014  . SLEEP APNEA    on CPAP  . Statin intolerance   . Tobacco abuse   . Vertebral artery occlusion, left    Past Surgical History:  Procedure Laterality Date  . ACHILLES TENDON REPAIR    . BACK SURGERY     lower back  . CARDIAC CATHETERIZATION    . CORONARY ARTERY BYPASS GRAFT  08/18/2012   Procedure: CORONARY ARTERY BYPASS GRAFTING (CABG);  Surgeon: Gaye Pollack, MD;  Location: [place];  Service: Open Heart Surgery;  Laterality: N/A;  CABG x four;  using left internal mammary artery and right leg greater saphenous vein harvested endoscopically  . CORONARY STENT INTERVENTION N/A 04/19/2018   Procedure: CORONARY STENT INTERVENTION;  Surgeon: Lorretta Harp, MD;  Location: [place] [facility];  Service: Cardiovascular;  Laterality: N/A;  . KNEE  ARTHROSCOPY     RIGHT  . LEFT HEART CATH AND CORS/GRAFTS ANGIOGRAPHY N/A 04/19/2018   Procedure: LEFT HEART CATH AND CORS/GRAFTS ANGIOGRAPHY;  Surgeon: Lorretta Harp, MD;  Location: [place] [facility];  Service: Cardiovascular;  Laterality: N/A;  . LUMBAR LAMINECTOMY/DECOMPRESSION MICRODISCECTOMY N/A 12/05/2015   Procedure: LUMBAR 2-3, LUMBAR 3-4 DECOMPRESSION ;  Surgeon: Phylliss Bob, MD;  Location: [place];  Service: Orthopedics;  Laterality: N/A;  . Right long finger extensor tendon repair     Current Outpatient Medications on File Prior to Visit  Medication Sig Dispense Refill  . aspirin EC 81 MG tablet Take 1 tablet (81 mg total) by mouth daily. 90 tablet 3  .  clopidogrel (PLAVIX) 75 MG tablet TAKE 1 TABLET BY MOUTH ONCE A DAY 90 tablet 3  . diphenoxylate-atropine (LOMOTIL) 2.5-0.025 MG tablet Take 2 tablets by mouth 4 (four) times daily as needed for diarrhea or loose stools. 30 tablet 0  . HYDROmorphone (DILAUDID) 2 MG tablet Take 1 tablet (2 mg total) by mouth every 4 (four) hours as needed for severe pain. 30 tablet 0  . losartan-hydrochlorothiazide (HYZAAR) 100-12.5 MG tablet TAKE 1 TABLET BY MOUTH ONCE A DAY 90 tablet 2  . methocarbamol (ROBAXIN) 500 MG tablet Take 1 tablet (500 mg total) by mouth 4 (four) times daily. 30 tablet 0  . nitroGLYCERIN (NITROSTAT) 0.4 MG SL tablet Place 1 tablet (0.4 mg total) under the tongue every 5 (five) minutes as needed for chest pain. 25 tablet 2  . pantoprazole (PROTONIX) 40 MG tablet TAKE 1 TABLET BY MOUTH ONCE A DAY 90 tablet 2  . rosuvastatin (CRESTOR) 5 MG tablet Take 5 mg by mouth 2 (two) times a week.    . sildenafil (VIAGRA) 100 MG tablet Take 0.5-1 tablets (50-100 mg total) by mouth daily as needed for erectile dysfunction. 5 tablet 11   No current facility-administered medications on file prior to visit.   Allergies  Allergen Reactions  . Statins Other (See Comments)    Bone and muscle pain  . Oxycodone Other (See Comments)  . Hydrocodone Nausea And Vomiting   Social History   Socioeconomic History  . Marital status: Married    Spouse name: Not on file  . Number of children: Not on file  . Years of education: Not on file  . Highest education level: Not on file  Occupational History  . Not on file  Tobacco Use  . Smoking status: Every Day    Packs/day: 1.50    Years: 43.00    Pack years: 64.50    Types: Cigarettes  . Smokeless tobacco: Never  . Tobacco comments:    Patient attempting to quit pt down to 1 ppd  Vaping Use  . Vaping Use: Never used  Substance and Sexual Activity  . Alcohol use: Yes    Comment: 1-2 drink occasionally  . Drug use: No  . Sexual activity: Not on file   Other Topics Concern  . Not on file  Social History Narrative  . Not on file   Social Determinants of Health   Financial Resource Strain: Not on file  Food Insecurity: Not on file  Transportation Needs: Not on file  Physical Activity: Not on file  Stress: Not on file  Social Connections: Not on file  Intimate Partner Violence: Not on file      Review of Systems  All other systems reviewed and are negative.     Objective:   Physical Exam Vitals reviewed.  Constitutional:      General: He is not in acute distress.    Appearance: Normal appearance. He is well-developed. He is obese. He is not ill-appearing or toxic-appearing.  Cardiovascular:     Rate and Rhythm: Normal rate and regular rhythm.     Heart sounds: Normal heart sounds.  Pulmonary:     Effort: Pulmonary effort is normal. No respiratory distress.     Breath sounds: Normal breath sounds. No wheezing or rales.  Abdominal:     General: Bowel sounds are normal. There is no distension.     Palpations: Abdomen is soft. Abdomen is not rigid.     Tenderness: There is no abdominal tenderness. There is no guarding or rebound. Negative signs include Murphy's sign and McBurney's sign.  Neurological:     Mental Status: He is alert.          Assessment & Plan:  Gastrointestinal hemorrhage associated with intestinal diverticulosis Given the recent episode of diverticulitis, I suspect that this is likely a diverticular bleed.  I recommended holding the aspirin and Plavix and monitoring for the next 3 to 4 days.  If he experiences no additional bleeding, I think he can then resume aspirin and Plavix given his underlying cardiac history.  If he experiences heavy bleeding, he is directed to go immediately to the emergency room.

## 2021-07-30 ENCOUNTER — Other Ambulatory Visit: Payer: Self-pay | Admitting: Cardiovascular Disease

## 2021-07-30 DIAGNOSIS — I10 Essential (primary) hypertension: Secondary | ICD-10-CM

## 2021-07-30 DIAGNOSIS — N183 Chronic kidney disease, stage 3 unspecified: Secondary | ICD-10-CM

## 2021-07-30 DIAGNOSIS — I2510 Atherosclerotic heart disease of native coronary artery without angina pectoris: Secondary | ICD-10-CM

## 2021-07-30 DIAGNOSIS — E782 Mixed hyperlipidemia: Secondary | ICD-10-CM

## 2021-08-29 ENCOUNTER — Telehealth: Payer: Self-pay | Admitting: Pulmonary Disease

## 2021-08-29 ENCOUNTER — Telehealth: Payer: Self-pay | Admitting: Cardiovascular Disease

## 2021-08-29 NOTE — Telephone Encounter (Signed)
I have called the patient and spoke with his wife and I have made a follow up with a NP so he can get a letter for work. Nothing further needed.

## 2021-08-29 NOTE — Telephone Encounter (Signed)
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 Patient states he needs this for work. Please advise

## 2021-08-29 NOTE — Telephone Encounter (Addendum)
Tried to call patient to inform him that Dr. 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 Sent letter through [facility] since there was no answer when calling patient and no voicemail to leave message.

## 2021-08-31 NOTE — Progress Notes (Signed)
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 Complicated by HTN, AFib, CAD, L Carotid Artery Occlusion, Chronic Bronchitis, GERD, CKD3, Cervical Ankylosis, Hypertriglyceridemia, Covid infection  06/2020,  NPSG 07/30/1999- AHI 56/hr, desaturation to 85%, body weight 195 lbs PFT 08/16/12- minimal obstruction, Nl DLCO ===================================================   06/24/12- 80 yoM smoker seen for Obstructive sleep apnea. Former patient of [facility]-sleep study in 2004; currently uses [facility] but would like to change to [employer]. Wears CPAP 10/ [facility] every night for approximately 8 hours or more; pressure working well for patient. Denies snoring and less daytime sleepiness. He is pleased.  09/02/21- 71 yoM Smoker ( 1.5 ppd/ 64.5 pkyrs) Coming to Re-establish seen for Obstructive sleep apnea.  Complicated by HTN, AFib, CAD, L Carotid Artery Occlusion, Chronic Bronchitis, GERD, CKD3, Cervical Ankylosis, Hypertriglyceridemia, Covid infection  06/2020,  NPSG 07/30/1999- AHI 56/hr, desaturation to 85%, body weight 195 lbs Epworth score- CPAP- 9.2 cwp/ [facility]      AirSense 10 AutoSet Download- compliance 100%, AHI 0.8/ hr Body weight today-210 lbs Covid vax-none Flu vax-none Does well with CPAP, uncomfortable w/o.  Download reviewed. PFT 08/16/12- minimal obstruction, Nl DLCO Some comfort issues with masks tried. Discussed mask fitting.  Has smoking cessation program through work. Denies active breathing problems.  ROS-see HPI Constitutional:   No-   weight loss, night sweats, fevers, chills, fatigue, lassitude. HEENT:   No-  headaches, difficulty swallowing, tooth/dental problems, sore throat,       No-  sneezing, itching, ear ache, nasal congestion, post nasal drip,  CV:  No-   chest pain, orthopnea, PND, swelling in lower extremities, anasarca, dizziness, palpitations Resp: No-   shortness of breath with exertion or at rest.              No-   productive  cough,  No non-productive cough,  No- coughing up of blood.              No-   change in color of mucus.  No- wheezing.   Skin: No-   rash or lesions. GI:  No-   heartburn, indigestion, abdominal pain, nausea, vomiting,  GU: . MS:  No-   joint pain or swelling.  Neuro-     nothing unusual Psych:  No- change in mood or affect. No depression or anxiety.  No memory loss.  OBJ- Physical Exam General- Alert, Oriented, Affect-appropriate, Distress- none acute, medium build Skin- rash-none, lesions- none, excoriation- none Lymphadenopathy- none Head- atraumatic            Eyes- Gross vision intact, PERRLA, conjunctivae and secretions clear            Ears- Hearing, canals-normal            Nose- Clear, no-Septal dev, mucus, polyps, erosion, perforation             Throat- Mallampati IV , mucosa clear , drainage- none, tonsils- atrophic Neck- flexible , trachea midline, no stridor , thyroid nl, carotid no bruit Chest - symmetrical excursion , unlabored           Heart/CV- RRR , no murmur , no gallop  , no rub, nl s1 s2                           - JVD- none , edema- none, stasis changes- none, varices- none           Lung- clear to P&A,  wheeze- none, cough- none , dullness-none, rub- none           Chest wall-  Abd-  Br/ Gen/ Rectal- Not done, not indicated Extrem- cyanosis- none, clubbing, none, atrophy- none, strength- nl Neuro- grossly intact to observation

## 2021-09-02 ENCOUNTER — Ambulatory Visit: Payer: BC Managed Care – PPO | Admitting: Primary Care

## 2021-09-02 ENCOUNTER — Other Ambulatory Visit: Payer: Self-pay

## 2021-09-02 ENCOUNTER — Encounter: Payer: Self-pay | Admitting: Internal Medicine

## 2021-09-02 ENCOUNTER — Ambulatory Visit (INDEPENDENT_AMBULATORY_CARE_PROVIDER_SITE_OTHER): Payer: BC Managed Care – PPO | Admitting: Internal Medicine

## 2021-09-02 VITALS — BP 140/80 | HR 76 | Temp 98.5°F | Ht 69.0 in | Wt 210.0 lb

## 2021-09-02 DIAGNOSIS — G4733 Obstructive sleep apnea (adult) (pediatric): Secondary | ICD-10-CM | POA: Diagnosis not present

## 2021-09-02 DIAGNOSIS — Z720 Tobacco use: Secondary | ICD-10-CM | POA: Diagnosis not present

## 2021-09-02 NOTE — Assessment & Plan Note (Signed)
Denies active symptoms. Strongly advised to work with his employer's smoking cessation program.

## 2021-09-02 NOTE — Patient Instructions (Signed)
Print copy of CPAP compliance download for you to show at work.  Order- refer to sleep disorders center for mask fitting/ desensitization  Order- DME [employer]- continue CPAP 9.2/ hr, auto-capable machine, mask of choice, humidifier, supplies, AirView/ card

## 2021-09-02 NOTE — Assessment & Plan Note (Signed)
Benefits from CPAP. Reviewed download. Given hard copy of download for employer. Plan- continue CPAP fixed 9.2

## 2021-09-24 ENCOUNTER — Ambulatory Visit (HOSPITAL_BASED_OUTPATIENT_CLINIC_OR_DEPARTMENT_OTHER): Payer: BC Managed Care – PPO | Attending: Internal Medicine | Admitting: Internal Medicine

## 2021-09-24 DIAGNOSIS — G4733 Obstructive sleep apnea (adult) (pediatric): Secondary | ICD-10-CM

## 2021-11-14 ENCOUNTER — Other Ambulatory Visit: Payer: Self-pay | Admitting: Cardiovascular Disease

## 2021-11-14 DIAGNOSIS — I2510 Atherosclerotic heart disease of native coronary artery without angina pectoris: Secondary | ICD-10-CM

## 2021-11-14 DIAGNOSIS — I10 Essential (primary) hypertension: Secondary | ICD-10-CM

## 2021-11-14 DIAGNOSIS — E782 Mixed hyperlipidemia: Secondary | ICD-10-CM

## 2021-11-14 DIAGNOSIS — N183 Chronic kidney disease, stage 3 unspecified: Secondary | ICD-10-CM

## 2021-11-18 ENCOUNTER — Encounter: Payer: Self-pay | Admitting: Family Medicine

## 2021-11-18 ENCOUNTER — Ambulatory Visit (INDEPENDENT_AMBULATORY_CARE_PROVIDER_SITE_OTHER): Payer: BC Managed Care – PPO | Admitting: Family Medicine

## 2021-11-18 ENCOUNTER — Other Ambulatory Visit: Payer: Self-pay

## 2021-11-18 VITALS — BP 152/72 | HR 72 | Temp 98.1°F | Resp 18 | Ht 69.0 in | Wt 210.0 lb

## 2021-11-18 DIAGNOSIS — L600 Ingrowing nail: Secondary | ICD-10-CM

## 2021-11-18 NOTE — Progress Notes (Signed)
Subjective:    Patient ID: [name], male    DOB: Aug 21, 1956, 66 y.o.   MRN: [id number]  HPI 03/26/21 The nail margins on either side of his great toenail are ingrown and painful.  He is requesting that I remove the ingrown portions on either side of his great toenail.  This is true of both his left and his right foot.  Ultimately after discussing the situation further, together we decided to remove the entire portion of the nail as I was concerned that the remaining nail unsupported on either margin would likely get hung on as sheet and be accidentally ripped off.  Therefore the patient agreed.  At that time, my plan was:  Both the medial and lateral nail margins of both the left and the right great toe are ingrown.  Therefore we decided to remove the entire toenail on both feet.  First, we performed the left foot.  I achieved anesthesia using a digital block and placed a tourniquet at the base of his toe.  I separated the toenail from the underlying nailbed using a metal elevator and remove the entire toenail with gentle traction using a pair hemostats.  I then covered the wound bed with Neosporin, wrapped in petroleum gauze, and wrapped the entire toe and Coban.  The tourniquet was removed.  Total tourniquet time was less than 2 minutes.  I then performed the same procedure on the right great toenail.  Patient tolerated both procedures without complication.  There was minimal blood loss.  Wound care was discussed.  11/18/21 Patient presents today complaining of bilateral foot pain.  He reports pain over the medial and lateral nail margins of both great toes.  He states that he feels like there is something stuck down deep inside where the nail has grown into the surrounding skin.  This occurs on both the medial and lateral nail margins on both the left and right toes.  He states that when I removed his toenails it did not help at all.  The pain was still there.  Therefore he believes that there  may be a foreign object or material stuck such as a piece of toenail. Past Medical History:  Diagnosis Date   Cervical spine ankylosis    CKD (chronic kidney disease) stage 3, GFR 30-59 ml/min (HCC)    COLONIC POLYPS, HX OF    CORONARY ARTERY DISEASE    a. s/p stent to RCA 2005. b. Stent to Cx 2007. c.  CABG X4 2014; d. LHC 04/19/18 occluded SVG-Circ, DES to in-stent restenosis of circ.    Diverticulosis    DIVERTICULOSIS, COLON    DYSPHAGIA UNSPECIFIED    intermittent   GERD    HEMORRHOIDS    HYPERLIPIDEMIA    HYPERTENSION    HYPERTRIGLYCERIDEMIA    Left carotid artery occlusion    OBESITY    Postoperative atrial fibrillation ([place]) 2014   SLEEP APNEA    on CPAP   Statin intolerance    Tobacco abuse    Vertebral artery occlusion, left    Past Surgical History:  Procedure Laterality Date   ACHILLES TENDON REPAIR     BACK SURGERY     lower back   CARDIAC CATHETERIZATION     CORONARY ARTERY BYPASS GRAFT  08/18/2012   Procedure: CORONARY ARTERY BYPASS GRAFTING (CABG);  Surgeon: Gaye Pollack, MD;  Location: [facility];  Service: Open Heart Surgery;  Laterality: N/A;  CABG x four;  using left internal mammary artery  and right leg greater saphenous vein harvested endoscopically   CORONARY STENT INTERVENTION N/A 04/19/2018   Procedure: CORONARY STENT INTERVENTION;  Surgeon: Lorretta Harp, MD;  Location: [place] [facility];  Service: Cardiovascular;  Laterality: N/A;   KNEE ARTHROSCOPY     RIGHT   LEFT HEART CATH AND CORS/GRAFTS ANGIOGRAPHY N/A 04/19/2018   Procedure: LEFT HEART CATH AND CORS/GRAFTS ANGIOGRAPHY;  Surgeon: Lorretta Harp, MD;  Location: [place] [facility];  Service: Cardiovascular;  Laterality: N/A;   LUMBAR LAMINECTOMY/DECOMPRESSION MICRODISCECTOMY N/A 12/05/2015   Procedure: LUMBAR 2-3, LUMBAR 3-4 DECOMPRESSION ;  Surgeon: Phylliss Bob, MD;  Location: [place];  Service: Orthopedics;  Laterality: N/A;   Right long finger extensor tendon repair     Current Outpatient  Medications on File Prior to Visit  Medication Sig Dispense Refill   aspirin EC 81 MG tablet Take 1 tablet (81 mg total) by mouth daily. 90 tablet 3   clopidogrel (PLAVIX) 75 MG tablet TAKE 1 TABLET BY MOUTH ONCE A DAY 90 tablet 3   diphenoxylate-atropine (LOMOTIL) 2.5-0.025 MG tablet Take 2 tablets by mouth 4 (four) times daily as needed for diarrhea or loose stools. 30 tablet 0   HYDROmorphone (DILAUDID) 2 MG tablet Take 1 tablet (2 mg total) by mouth every 4 (four) hours as needed for severe pain. 30 tablet 0   losartan-hydrochlorothiazide (HYZAAR) 100-12.5 MG tablet TAKE 1 TABLET BY MOUTH ONCE A DAY 90 tablet 2   pantoprazole (PROTONIX) 40 MG tablet TAKE 1 TABLET BY MOUTH ONCE A DAY 90 tablet 1   rosuvastatin (CRESTOR) 5 MG tablet TAKE 1 TABLET BY MOUTH ONCE A DAY 90 tablet 1   sildenafil (VIAGRA) 100 MG tablet Take 0.5-1 tablets (50-100 mg total) by mouth daily as needed for erectile dysfunction. 5 tablet 11   nitroGLYCERIN (NITROSTAT) 0.4 MG SL tablet Place 1 tablet (0.4 mg total) under the tongue every 5 (five) minutes as needed for chest pain. 25 tablet 2   No current facility-administered medications on file prior to visit.   Allergies  Allergen Reactions   Statins Other (See Comments)    Bone and muscle pain   Oxycodone Other (See Comments)   Hydrocodone Nausea And Vomiting   Social History   Socioeconomic History   Marital status: Married    Spouse name: Not on file   Number of children: Not on file   Years of education: Not on file   Highest education level: Not on file  Occupational History   Not on file  Tobacco Use   Smoking status: Every Day    Packs/day: 1.50    Years: 43.00    Pack years: 64.50    Types: Cigarettes   Smokeless tobacco: Never   Tobacco comments:    Patient attempting to quit pt down to 1 ppd  Vaping Use   Vaping Use: Never used  Substance and Sexual Activity   Alcohol use: Yes    Comment: 1-2 drink occasionally   Drug use: No   Sexual  activity: Not on file  Other Topics Concern   Not on file  Social History Narrative   Not on file   Social Determinants of Health   Financial Resource Strain: Not on file  Food Insecurity: Not on file  Transportation Needs: Not on file  Physical Activity: Not on file  Stress: Not on file  Social Connections: Not on file  Intimate Partner Violence: Not on file    .  Review of Systems  All other systems reviewed and are negative.     Objective:   Physical Exam Vitals reviewed.  Constitutional:      Appearance: He is normal weight.  Cardiovascular:     Rate and Rhythm: Normal rate and regular rhythm.  Pulmonary:     Effort: Pulmonary effort is normal.     Breath sounds: Normal breath sounds.  Feet:     Right foot:     Toenail Condition: Right toenails are ingrown.     Left foot:     Toenail Condition: Left toenails are ingrown.  Neurological:     Mental Status: He is alert.          Assessment & Plan:  Ingrown toenail of both feet I believe the patient has chronically inflamed tissue due to ingrown toenails.  I believe that he needs the toenails removed and the matrix destroyed so that the nail will not grow back to prevent the pain.  I do not feel comfortable simply cutting his distal portion of his toe open and "digging around" for piece of foreign material.  Therefore I recommended a referral to podiatry.  Patient will not be charged for this visit

## 2021-12-03 ENCOUNTER — Encounter: Payer: Self-pay | Admitting: Family Medicine

## 2021-12-03 DIAGNOSIS — L600 Ingrowing nail: Secondary | ICD-10-CM

## 2021-12-11 ENCOUNTER — Ambulatory Visit: Payer: BC Managed Care – PPO | Admitting: Podiatry

## 2021-12-11 ENCOUNTER — Other Ambulatory Visit: Payer: Self-pay

## 2021-12-11 DIAGNOSIS — L600 Ingrowing nail: Secondary | ICD-10-CM | POA: Diagnosis not present

## 2021-12-11 DIAGNOSIS — I999 Unspecified disorder of circulatory system: Secondary | ICD-10-CM

## 2021-12-11 NOTE — Patient Instructions (Signed)

## 2021-12-11 NOTE — Progress Notes (Signed)
Subjective:   Patient ID: [name], male   DOB: 66 y.o.   MRN: [id number]   HPI Patient presents with painful nailbeds of the big toes of both feet that were removed and then regrew and are still giving him a lot of problems in the corners.  States that he has had this for a while and also after discussion he does have claudication-like symptoms with cramping in his lower legs right over left and long-term history of smoking 1-1/2 packs/day for 43 years.  Patient tries to be active but has trouble but does do some forms of activity   Review of Systems  All other systems reviewed and are negative.      Objective:  Physical Exam Vitals and nursing note reviewed.  Constitutional:      Appearance: He is well-developed.  Pulmonary:     Effort: Pulmonary effort is normal.  Musculoskeletal:        General: Normal range of motion.  Skin:    General: Skin is warm.  Neurological:     Mental Status: He is alert.    Vascular status found to be diminished with diminishment of PT DP pulses bilateral and neurological found to be intact bilateral range of motion adequate subtalar midtarsal joint.  Patient does have damaged hallux nails bilateral with incurvation of the medial and lateral corners that are painful when pressed with no redness or drainage noted     Assessment:  Chronic ingrown toenail deformity bilateral hallux medial lateral border with pain with possibility for vascular issues claudication or issues with circulatory status     Plan:  Reviewed condition discussed at great detail and at this point I do think the nail borders need to be removed but first I want to get confirmation of circulatory status and I am sending out for ABIs and digital flow pressures.  Depending on response we will consider ingrown toenail removal and all conditions explained to patient

## 2021-12-23 ENCOUNTER — Encounter (HOSPITAL_COMMUNITY): Payer: Self-pay

## 2021-12-23 ENCOUNTER — Ambulatory Visit (HOSPITAL_COMMUNITY)
Admission: EM | Admit: 2021-12-23 | Discharge: 2021-12-23 | Disposition: A | Discharge status: Home / Self Care | Payer: No Typology Code available for payment source | Attending: Sports Medicine | Admitting: Sports Medicine

## 2021-12-23 ENCOUNTER — Other Ambulatory Visit: Payer: Self-pay

## 2021-12-23 DIAGNOSIS — S46211A Strain of muscle, fascia and tendon of other parts of biceps, right arm, initial encounter: Secondary | ICD-10-CM

## 2021-12-23 NOTE — ED Provider Notes (Signed)
[place]    CSN: [id number] Arrival date & time: 12/23/21  0277      History   Chief Complaint Chief Complaint  Patient presents with   Arm Injury    HPI [name] is a 66 y.o. male who presents with right arm injury.   Arm Injury Associated symptoms: no fever    Patient presents that on Wednesday, 12/18/2021 he was attempting to pick up a battery from a car that weighed about 40 pounds and when he was going to lift up he felt a pop in the mid aspect of the bicep tendon of the right arm.  He had some pain, but was able to go on mostly with his day.  He has had some tenderness, and over the weekend he noticed a decent amount of swelling and bruising over the volar aspect of the bicep.  He has some pain in the mid aspect of the bicep and slightly distally.  He has been working, but thought he needed to get it checked out when she noticed the swelling and bruising.  He denies any previous injury to the shoulder or arm.  He is not taking any medication for the pain.  Past Medical History:  Diagnosis Date   Cervical spine ankylosis    CKD (chronic kidney disease) stage 3, GFR 30-59 ml/min (HCC)    COLONIC POLYPS, HX OF    CORONARY ARTERY DISEASE    a. s/p stent to RCA 2005. b. Stent to Cx 2007. c.  CABG X4 2014; d. LHC 04/19/18 occluded SVG-Circ, DES to in-stent restenosis of circ.    Diverticulosis    DIVERTICULOSIS, COLON    DYSPHAGIA UNSPECIFIED    intermittent   GERD    HEMORRHOIDS    HYPERLIPIDEMIA    HYPERTENSION    HYPERTRIGLYCERIDEMIA    Left carotid artery occlusion    OBESITY    Postoperative atrial fibrillation ([place]) 2014   SLEEP APNEA    on CPAP   Statin intolerance    Tobacco abuse    Vertebral artery occlusion, left     Patient Active Problem List   Diagnosis Date Noted   Cervical spine ankylosis    Left carotid artery occlusion    Vertebral artery occlusion, left    Mixed hyperlipidemia 05/07/2018   CAD (coronary artery disease)  04/19/2018   Unstable angina ([place]) 04/16/2018   CKD (chronic kidney disease) stage 3, GFR 30-59 ml/min ([place]) 04/16/2018   Neurogenic claudication ([place]) 12/05/2015   PAF (paroxysmal atrial fibrillation) ([place]) 09/10/2012   Tobacco abuse 07/01/2012   HYPERTRIGLYCERIDEMIA 06/19/2009   Elevated lipids 06/19/2009   OBESITY 06/19/2009   Essential hypertension 06/19/2009   Chronic bronchitis ([place]) 06/19/2009   CHEST PAIN 06/19/2009   DYSPHAGIA UNSPECIFIED 06/19/2009   Coronary atherosclerosis 03/01/2008   HEMORRHOIDS 03/01/2008   GERD 03/01/2008   DIVERTICULOSIS, COLON 03/01/2008   Obstructive sleep apnea 03/01/2008   COLONIC POLYPS, HX OF 03/01/2008    Past Surgical History:  Procedure Laterality Date   ACHILLES TENDON REPAIR     BACK SURGERY     lower back   CARDIAC CATHETERIZATION     CORONARY ARTERY BYPASS GRAFT  08/18/2012   Procedure: CORONARY ARTERY BYPASS GRAFTING (CABG);  Surgeon: Gaye Pollack, MD;  Location: [facility];  Service: Open Heart Surgery;  Laterality: N/A;  CABG x four;  using left internal mammary artery and right leg greater saphenous vein harvested endoscopically   CORONARY STENT INTERVENTION N/A 04/19/2018  Procedure: CORONARY STENT INTERVENTION;  Surgeon: Lorretta Harp, MD;  Location: [facility];  Service: Cardiovascular;  Laterality: N/A;   KNEE ARTHROSCOPY     RIGHT   LEFT HEART CATH AND CORS/GRAFTS ANGIOGRAPHY N/A 04/19/2018   Procedure: LEFT HEART CATH AND CORS/GRAFTS ANGIOGRAPHY;  Surgeon: Lorretta Harp, MD;  Location: [facility];  Service: Cardiovascular;  Laterality: N/A;   LUMBAR LAMINECTOMY/DECOMPRESSION MICRODISCECTOMY N/A 12/05/2015   Procedure: LUMBAR 2-3, LUMBAR 3-4 DECOMPRESSION ;  Surgeon: Phylliss Bob, MD;  Location: [facility];  Service: Orthopedics;  Laterality: N/A;   Right long finger extensor tendon repair         Home Medications    Prior to Admission medications   Medication Sig Start Date End Date Taking?  Authorizing Provider  aspirin EC 81 MG tablet Take 1 tablet (81 mg total) by mouth daily. 03/26/17   Isaiah Serge, NP  clopidogrel (PLAVIX) 75 MG tablet TAKE 1 TABLET BY MOUTH ONCE A DAY 07/22/21   Josue Hector, MD  diphenoxylate-atropine (LOMOTIL) 2.5-0.025 MG tablet Take 2 tablets by mouth 4 (four) times daily as needed for diarrhea or loose stools. 06/25/21   Susy Frizzle, MD  HYDROmorphone (DILAUDID) 2 MG tablet Take 1 tablet (2 mg total) by mouth every 4 (four) hours as needed for severe pain. 08/28/20   Susy Frizzle, MD  losartan-hydrochlorothiazide (HYZAAR) 100-12.5 MG tablet TAKE 1 TABLET BY MOUTH ONCE A DAY 07/30/21   Josue Hector, MD  nitroGLYCERIN (NITROSTAT) 0.4 MG SL tablet Place 1 tablet (0.4 mg total) under the tongue every 5 (five) minutes as needed for chest pain. 05/13/21 08/11/21  Josue Hector, MD  pantoprazole (PROTONIX) 40 MG tablet TAKE 1 TABLET BY MOUTH ONCE A DAY 11/14/21   Josue Hector, MD  rosuvastatin (CRESTOR) 5 MG tablet TAKE 1 TABLET BY MOUTH ONCE A DAY 07/30/21   Josue Hector, MD  sildenafil (VIAGRA) 100 MG tablet Take 0.5-1 tablets (50-100 mg total) by mouth daily as needed for erectile dysfunction. 06/28/19   Susy Frizzle, MD    Family History Family History  Problem Relation Age of Onset   Cancer Mother    Heart disease Brother     Social History Social History   Tobacco Use   Smoking status: Every Day    Packs/day: 1.50    Years: 43.00    Pack years: 64.50    Types: Cigarettes   Smokeless tobacco: Never   Tobacco comments:    Patient attempting to quit pt down to 1 ppd  Vaping Use   Vaping Use: Never used  Substance Use Topics   Alcohol use: Yes    Comment: 1-2 drink occasionally   Drug use: No     Allergies   Statins, Oxycodone, and Hydrocodone   Review of Systems Review of Systems  Constitutional:  Negative for chills and fever.  Musculoskeletal:  Positive for joint swelling.       + right arm pain, swelling,  and ecchymossi  Skin:  Positive for color change.    Physical Exam Triage Vital Signs ED Triage Vitals  Enc Vitals Group     BP 12/23/21 0855 (!) 167/81     Pulse Rate 12/23/21 0855 66     Resp 12/23/21 0855 18     Temp 12/23/21 0855 97.7 F (36.5 C)     Temp Source 12/23/21 0855 Oral     SpO2 12/23/21 0855 96 %  Weight --      Height --      Head Circumference --      Peak Flow --      Pain Score 12/23/21 0854 3     Pain Loc --      Pain Edu? --      Excl. in [place]? --    No data found.  Updated Vital Signs BP (!) 167/81 (BP Location: Left Arm)    Pulse 66    Temp 97.7 F (36.5 C) (Oral)    Resp 18    SpO2 96%   Physical Exam Gen: Well-appearing, in no acute distress; non-toxic CV: Regular Rate. Well-perfused. Warm.  Resp: Breathing unlabored on room air; no wheezing. Psych: Fluid speech in conversation; appropriate affect; normal thought process Neuro: Sensation intact throughout. No gross coordination deficits.  MSK:  - Right arm: Notable soft tissue swelling and ecchymosis throughout the mid and distal aspect of the biceps tendon.  There is some bogginess noted more so near the distal insertion. + Hooks test, I was unable to pick up distal bicep tendon on the right, compared to intact tendon of the left.  Mild pain within the humeral groove of the right upper arm. + Speed's Test.  Pain with resisted supination/pronation.  Preserved flexion and extension.  No significant Popeye or reverse Popeye deformity.  Neurovascular intact distally.  UC Treatments / Results  Labs (all labs ordered are listed, but only abnormal results are displayed) Labs Reviewed - No data to display  EKG   Radiology No results found.  Procedures Procedures (including critical care time)  Medications Ordered in UC Medications - No data to display  Initial Impression / Assessment and Plan / UC Course  I have reviewed the triage vital signs and the nursing notes.  Pertinent labs &  imaging results that were available during my care of the patient were reviewed by me and considered in my medical decision making (see chart for details).     Distal biceps tendon rupture, right - suspected.  Patient with concentric/eccentric load last Wednesday when he felt a pop in the biceps tendon.  Notable ecchymosis and swelling present. + Hook test.  I can feel the biceps muscle and tendon move with pronation/resisted supination at the biceps crease.  Concerning for full rupture of the biceps tendon versus high-grade partial rupture.  Exam seems more indicative of distal versus proximal rupture.  We will patient to Dr. Tamera Punt for further evaluation, he may want dedicated ultrasound study at that time upon his discretion.  Discussed with the patient operative versus nonoperative treatment, and he would do well likely with nonoperative treatment although would want him to have this discussion with Dr. Tamera Punt.  May use ice, Ace compression for swelling and pain.  May take Tylenol as well for pain control.  Final Clinical Impressions(s) / UC Diagnoses   Final diagnoses:  Rupture of distal biceps tendon, right, initial encounter     Discharge Instructions      [place],  Your exam is suggestive of a biceps tendon tear. They may want to use a large ultrasound machine or other imaging to evaluate the extent of the injury.    You may ice and/or use a compression sleeve for pain or discomfort.  Call [facility] to get an appointment with Dr. Tamera Punt sometime this week     ED Prescriptions   None    PDMP not reviewed this encounter.   Elba Barman, [place] 12/23/21 [phone]

## 2021-12-23 NOTE — ED Triage Notes (Addendum)
Pt reports last week he was at working pulling on an object and heard a pop, the next day he woke up with a bruise to his right upper arm. He states there is some tenderness to the arm.  Pt is taking Plavix and asa.

## 2021-12-23 NOTE — Discharge Instructions (Addendum)
[name],  Your exam is suggestive of a biceps tendon tear. They may want to use a large ultrasound machine or other imaging to evaluate the extent of the injury.    You may ice and/or use a compression sleeve for pain or discomfort.  Call [facility] to get an appointment with Dr. Tamera Punt sometime this week

## 2021-12-26 ENCOUNTER — Other Ambulatory Visit: Payer: Self-pay | Admitting: Podiatry

## 2021-12-26 DIAGNOSIS — I999 Unspecified disorder of circulatory system: Secondary | ICD-10-CM

## 2021-12-26 DIAGNOSIS — L600 Ingrowing nail: Secondary | ICD-10-CM

## 2022-01-01 ENCOUNTER — Ambulatory Visit (HOSPITAL_COMMUNITY)
Admission: RE | Admit: 2022-01-01 | Discharge: 2022-01-01 | Disposition: A | Discharge status: Home / Self Care | Payer: BC Managed Care – PPO | Source: Ambulatory Visit | Attending: Cardiovascular Disease | Admitting: Cardiovascular Disease

## 2022-01-01 ENCOUNTER — Other Ambulatory Visit: Payer: Self-pay

## 2022-01-01 DIAGNOSIS — L600 Ingrowing nail: Secondary | ICD-10-CM | POA: Insufficient documentation

## 2022-01-01 DIAGNOSIS — I739 Peripheral vascular disease, unspecified: Secondary | ICD-10-CM | POA: Diagnosis not present

## 2022-01-01 DIAGNOSIS — I999 Unspecified disorder of circulatory system: Secondary | ICD-10-CM | POA: Insufficient documentation

## 2022-01-10 ENCOUNTER — Other Ambulatory Visit: Payer: Self-pay

## 2022-01-10 ENCOUNTER — Ambulatory Visit: Payer: BC Managed Care – PPO | Admitting: Family Medicine

## 2022-01-10 ENCOUNTER — Encounter: Payer: Self-pay | Admitting: Family Medicine

## 2022-01-10 VITALS — BP 148/82 | HR 70 | Temp 97.9°F | Resp 18 | Ht 69.0 in | Wt 210.0 lb

## 2022-01-10 DIAGNOSIS — L723 Sebaceous cyst: Secondary | ICD-10-CM

## 2022-01-10 NOTE — Progress Notes (Signed)
? ?Subjective:  ? ? Patient ID: [name], male    DOB: 1956/07/06, 66 y.o.   MRN: [id number] ? ?HPI ?Patient reports a 1 week history of a painful knot on his scrotum.  On examination, on the right side of his scrotum posterior to the testicle is a cutaneous nodule that appears to be an inflamed sebaceous cyst.  It has drained partially.  There is a small opening that I am able to express a small amount of bloody fluid.  Patient states that it is felt better ever since it started draining.  He denies any fevers or chills.  There is no surrounding erythema or cellulitis.  The lesion is approximately 1 cm in diameter ?Past Medical History:  ?Diagnosis Date  ?? Cervical spine ankylosis   ?? CKD (chronic kidney disease) stage 3, GFR 30-59 ml/min (HCC)   ?? COLONIC POLYPS, HX OF   ?? CORONARY ARTERY DISEASE   ? a. s/p stent to RCA 2005. b. Stent to Cx 2007. c.  CABG X4 2014; d. LHC 04/19/18 occluded SVG-Circ, DES to in-stent restenosis of circ.   ?? Diverticulosis   ?? DIVERTICULOSIS, COLON   ?? DYSPHAGIA UNSPECIFIED   ? intermittent  ?? GERD   ?? HEMORRHOIDS   ?? HYPERLIPIDEMIA   ?? HYPERTENSION   ?? HYPERTRIGLYCERIDEMIA   ?? Left carotid artery occlusion   ?? OBESITY   ?? Postoperative atrial fibrillation ([place]) 2014  ?? SLEEP APNEA   ? on CPAP  ?? Statin intolerance   ?? Tobacco abuse   ?? Vertebral artery occlusion, left   ? ?Past Surgical History:  ?Procedure Laterality Date  ?? ACHILLES TENDON REPAIR    ?? BACK SURGERY    ? lower back  ?? CARDIAC CATHETERIZATION    ?? CORONARY ARTERY BYPASS GRAFT  08/18/2012  ? Procedure: CORONARY ARTERY BYPASS GRAFTING (CABG);  Surgeon: Gaye Pollack, MD;  Location: [facility];  Service: Open Heart Surgery;  Laterality: N/A;  CABG x four;  using left internal mammary artery and right leg greater saphenous vein harvested endoscopically  ?? CORONARY STENT INTERVENTION N/A 04/19/2018  ? Procedure: CORONARY STENT INTERVENTION;  Surgeon: Lorretta Harp, MD;  Location: [facility];  Service: Cardiovascular;  Laterality: N/A;  ?? KNEE ARTHROSCOPY    ? RIGHT  ?? LEFT HEART CATH AND CORS/GRAFTS ANGIOGRAPHY N/A 04/19/2018  ? Procedure: LEFT HEART CATH AND CORS/GRAFTS ANGIOGRAPHY;  Surgeon: Lorretta Harp, MD;  Location: [facility];  Service: Cardiovascular;  Laterality: N/A;  ?? LUMBAR LAMINECTOMY/DECOMPRESSION MICRODISCECTOMY N/A 12/05/2015  ? Procedure: LUMBAR 2-3, LUMBAR 3-4 DECOMPRESSION ;  Surgeon: Phylliss Bob, MD;  Location: [facility];  Service: Orthopedics;  Laterality: N/A;  ?? Right long finger extensor tendon repair    ? ?Current Outpatient Medications on File Prior to Visit  ?Medication Sig Dispense Refill  ?? aspirin EC 81 MG tablet Take 1 tablet (81 mg total) by mouth daily. 90 tablet 3  ?? clopidogrel (PLAVIX) 75 MG tablet TAKE 1 TABLET BY MOUTH ONCE A DAY 90 tablet 3  ?? diphenoxylate-atropine (LOMOTIL) 2.5-0.025 MG tablet Take 2 tablets by mouth 4 (four) times daily as needed for diarrhea or loose stools. 30 tablet 0  ?? HYDROmorphone (DILAUDID) 2 MG tablet Take 1 tablet (2 mg total) by mouth every 4 (four) hours as needed for severe pain. 30 tablet 0  ?? losartan-hydrochlorothiazide (HYZAAR) 100-12.5 MG tablet TAKE 1 TABLET BY MOUTH ONCE A DAY 90 tablet 2  ??  pantoprazole (PROTONIX) 40 MG tablet TAKE 1 TABLET BY MOUTH ONCE A DAY 90 tablet 1  ?? rosuvastatin (CRESTOR) 5 MG tablet TAKE 1 TABLET BY MOUTH ONCE A DAY 90 tablet 1  ?? sildenafil (VIAGRA) 100 MG tablet Take 0.5-1 tablets (50-100 mg total) by mouth daily as needed for erectile dysfunction. 5 tablet 11  ?? nitroGLYCERIN (NITROSTAT) 0.4 MG SL tablet Place 1 tablet (0.4 mg total) under the tongue every 5 (five) minutes as needed for chest pain. 25 tablet 2  ? ?No current facility-administered medications on file prior to visit.  ? ?Allergies  ?Allergen Reactions  ?? Statins Other (See Comments)  ?  Bone and muscle pain  ?? Oxycodone Other (See Comments)  ?? Hydrocodone Nausea And Vomiting  ? ?Social History   ? ?Socioeconomic History  ?? Marital status: Married  ?  Spouse name: Not on file  ?? Number of children: Not on file  ?? Years of education: Not on file  ?? Highest education level: Not on file  ?Occupational History  ?? Not on file  ?Tobacco Use  ?? Smoking status: Every Day  ?  Packs/day: 1.50  ?  Years: 43.00  ?  Pack years: 64.50  ?  Types: Cigarettes  ?? Smokeless tobacco: Never  ?? Tobacco comments:  ?  Patient attempting to quit pt down to 1 ppd  ?Vaping Use  ?? Vaping Use: Never used  ?Substance and Sexual Activity  ?? Alcohol use: Yes  ?  Comment: 1-2 drink occasionally  ?? Drug use: No  ?? Sexual activity: Not on file  ?Other Topics Concern  ?? Not on file  ?Social History Narrative  ?? Not on file  ? ?Social Determinants of Health  ? ?Financial Resource Strain: Not on file  ?Food Insecurity: Not on file  ?Transportation Needs: Not on file  ?Physical Activity: Not on file  ?Stress: Not on file  ?Social Connections: Not on file  ?Intimate Partner Violence: Not on file  ? ? ? ? ?Review of Systems  ?All other systems reviewed and are negative. ? ?   ?Objective:  ? Physical Exam ?Vitals reviewed.  ?Constitutional:   ?   General: He is not in acute distress. ?   Appearance: Normal appearance. He is well-developed. He is obese. He is not ill-appearing or toxic-appearing.  ?Cardiovascular:  ?   Rate and Rhythm: Normal rate and regular rhythm.  ?   Heart sounds: Normal heart sounds.  ?Pulmonary:  ?   Effort: Pulmonary effort is normal. No respiratory distress.  ?   Breath sounds: Normal breath sounds. No wheezing or rales.  ?Abdominal:  ?   General: Bowel sounds are increased. There is no distension.  ?   Palpations: Abdomen is soft. Abdomen is not rigid.  ?   Tenderness: There is abdominal tenderness in the left lower quadrant. There is no guarding or rebound. Negative signs include Murphy's sign and McBurney's sign.  ? ? ?Genitourinary: ?   Testes: Normal.     ?   Right: Mass, tenderness or swelling not  present.     ?   Left: Mass, tenderness or swelling not present.  ? ? ?Skin: ?   Findings: Lesion present. No erythema.  ?Neurological:  ?   Mental Status: He is alert.  ? ? ? ? ? ?   ?Assessment & Plan:  ?Sebaceous cyst of scrotum ?Patient has an inflamed sebaceous cyst on the scrotum.  I recommended incision and drainage.  Patient states  that he is feeling better.  He has an appointment to meet with his heart doctor to discuss a stent in his legs due to peripheral artery disease next week.  He does not want to do anything that may compromise that.  Since he is feeling better he wants to monitor the area.  If not improving next week he can return for an incision and drainage at any point. ?

## 2022-01-15 ENCOUNTER — Ambulatory Visit: Payer: BC Managed Care – PPO | Admitting: Cardiovascular Disease

## 2022-01-22 ENCOUNTER — Encounter: Payer: Self-pay | Admitting: Cardiovascular Disease

## 2022-01-22 ENCOUNTER — Ambulatory Visit: Payer: BC Managed Care – PPO | Admitting: Cardiovascular Disease

## 2022-01-22 DIAGNOSIS — I739 Peripheral vascular disease, unspecified: Secondary | ICD-10-CM

## 2022-01-22 NOTE — Assessment & Plan Note (Signed)
[name] was referred to me by Dr. Paulla Dolly for symptomatic PAD.  He is a cardiology patient of Dr. Kyla Balzarine.  He has had symmetric lower extremity claudication for at least a year.  He went to see his podiatrist who ordered Doppler studies on 01/01/2022 revealing a right ABI of 0.70 and a left of 0.67.  He has a high-frequency signal in his mid right SFA and moderate left SFA disease.  He wishes to proceed with angiography and endovascular therapy for lifestyle-limiting claudication. ?

## 2022-01-22 NOTE — Progress Notes (Signed)
? ? ? ?01/22/2022 ?[name]   ?1956/09/14  ?[id number] ? ?Primary Physician Susy Frizzle, MD ?Primary Cardiologist: Lorretta Harp MD Lupe Car[place], [place] ? ?HPI:  [name] is a 66 y.o. mildly overweight married Caucasian male father of 70, grandfather to 1 grandchild who is accompanied by his wife Juliann Pulse today.  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  He was referred by Dr. Paulla Dolly, his podiatrist, for symptomatic PAD.  He has a history of CAD having undergone coronary artery bypass grafting x4 by Dr. Cyndia Bent in 2014.  He has treated hypertension, hyperlipidemia and obstructive sleep apnea.  He has a long history tobacco abuse smoking 100 pack years now smoking 1 pack a day down from 2 packs a day.  I catheterized him 04/19/2018 and stented his circumflex.  His heart is stable.  He has had claudication right equal to left for at least a year with Dopplers that show high-grade mid right SFA stenosis and moderate left SFA stenosis.  He wishes to proceed with angiography and intervention. ? ? ?No outpatient medications have been marked as taking for the 01/22/22 encounter (Office Visit) with Lorretta Harp, MD.  ?  ? ?Allergies  ?Allergen Reactions  ? Statins Other (See Comments)  ?  Bone and muscle pain  ? Oxycodone Other (See Comments)  ? Hydrocodone Nausea And Vomiting  ? ? ?Social History  ? ?Socioeconomic History  ? Marital status: Married  ?  Spouse name: Not on file  ? Number of children: Not on file  ? Years of education: Not on file  ? Highest education level: Not on file  ?Occupational History  ? Not on file  ?Tobacco Use  ? Smoking status: Every Day  ?  Packs/day: 1.50  ?  Years: 43.00  ?  Pack years: 64.50  ?  Types: Cigarettes  ? Smokeless tobacco: Never  ? Tobacco comments:  ?  Patient attempting to quit pt down to 1 ppd  ?Vaping Use  ? Vaping Use: Never used  ?Substance and Sexual Activity  ? Alcohol use: Yes  ?  Comment: 1-2 drink occasionally  ? Drug  use: No  ? Sexual activity: Not on file  ?Other Topics Concern  ? Not on file  ?Social History Narrative  ? Not on file  ? ?Social Determinants of Health  ? ?Financial Resource Strain: Not on file  ?Food Insecurity: Not on file  ?Transportation Needs: Not on file  ?Physical Activity: Not on file  ?Stress: Not on file  ?Social Connections: Not on file  ?Intimate Partner Violence: Not on file  ?  ? ?Review of Systems: ?General: negative for chills, fever, night sweats or weight changes.  ?Cardiovascular: negative for chest pain, dyspnea on exertion, edema, orthopnea, palpitations, paroxysmal nocturnal dyspnea or shortness of breath ?Dermatological: negative for rash ?Respiratory: negative for cough or wheezing ?Urologic: negative for hematuria ?Abdominal: negative for nausea, vomiting, diarrhea, bright red blood per rectum, melena, or hematemesis ?Neurologic: negative for visual changes, syncope, or dizziness ?All other systems reviewed and are otherwise negative except as noted above. ? ? ? ?Blood pressure (!) 120/58, pulse 68, height '5\' 9"'$  (1.753 m), weight 216 lb (98 kg), SpO2 95 %.  ?General appearance: alert and no distress ?Neck: no adenopathy, no carotid bruit, no JVD, supple, symmetrical, trachea midline, and thyroid not enlarged, symmetric, no tenderness/mass/nodules ?Lungs: clear to auscultation bilaterally ?Heart: regular rate and rhythm, S1, S2 normal, no  murmur, click, rub or gallop ?Extremities: extremities normal, atraumatic, no cyanosis or edema ?Pulses: Diminished pedal pulses ?Skin: Skin color, texture, turgor normal. No rashes or lesions ?Neurologic: Grossly normal ? ?EKG sinus rhythm at 68 with right bundle branch block.  I personally reviewed this EKG. ? ?ASSESSMENT AND PLAN:  ? ?Peripheral arterial disease ([place]) ?Mr. Scadden was referred to me by Dr. Paulla Dolly for symptomatic PAD.  He is a cardiology patient of Dr. Kyla Balzarine.  He has had symmetric lower extremity claudication for at least a year.  He  went to see his podiatrist who ordered Doppler studies on 01/01/2022 revealing a right ABI of 0.70 and a left of 0.67.  He has a high-frequency signal in his mid right SFA and moderate left SFA disease.  He wishes to proceed with angiography and endovascular therapy for lifestyle-limiting claudication. ? ? ? ? ?Lorretta Harp MD FACP,FACC,FAHA, FSCAI ?01/22/2022 ?2:45 PM ?

## 2022-01-22 NOTE — Patient Instructions (Addendum)
Medication Instructions:  ?Your physician recommends that you continue on your current medications as directed. Please refer to the Current Medication list given to you today. ? ?*If you need a refill on your cardiac medications before your next appointment, please call your pharmacy* ? ? ?Lab Work: ?Your physician recommends that you have labs drawn today: BMET & CBC ? ?If you have labs (blood work) drawn today and your tests are completely normal, you will receive your results only by: ?MyChart Message (if you have MyChart) OR ?A paper copy in the mail ?If you have any lab test that is abnormal or we need to change your treatment, we will call you to review the results. ? ? ?Testing/Procedures: ?Your physician has requested that you have a lower extremity arterial duplex. This test is an ultrasound of the arteries in the legs. It looks at arterial blood flow in the legs. Allow one hour for Lower Arterial scans. There are no restrictions or special instructions ? ?Your physician has requested that you have an ankle brachial index (ABI). During this test an ultrasound and blood pressure cuff are used to evaluate the arteries that supply the arms and legs with blood. Allow thirty minutes for this exam. There are no restrictions or special instructions. ?To be done 1 week after PV procedure (4/20). These will be done at [place] ? ? ?Follow-Up: ?At [facility], you and your health needs are our priority.  As part of our continuing mission to provide you with exceptional heart care, we have created designated Provider Care Teams.  These Care Teams include your primary Cardiologist (physician) and Advanced Practice Providers (APPs -  Physician Assistants and Nurse Practitioners) who all work together to provide you with the care you need, when you need it. ? ?We recommend signing up for the patient portal called "MyChart".  Sign up information is provided on this After Visit Summary.  MyChart is  used to connect with patients for Virtual Visits (Telemedicine).  Patients are able to view lab/test results, encounter notes, upcoming appointments, etc.  Non-urgent messages can be sent to your provider as well.   ?To learn more about what you can do with MyChart, go to [email or web address].   ? ?Your next appointment:   ?2-3 week(s) after procedure ? ?The format for your next appointment:   ?In Person ? ?Provider:   ?Quay Burow, MD ?  ? ? ?Other Instructions ? ?[place] [address] ?[place] [address] ?Dept: [phone] ?Loc: [phone] ? ?ZAIDE KARDELL  01/22/2022 ? ?You are scheduled for a Peripheral Angiogram on Thursday, April 20 with Dr. Quay Burow. ? ?1. Please arrive at the Main Entrance A at [facility]: [employer], [place] [address] at 9:00 AM (This time is two hours before your procedure to ensure your preparation). Free valet parking service is available.  ? ?Special note: Every effort is made to have your procedure done on time. Please understand that emergencies sometimes delay scheduled procedures. ? ?2. Diet: Do not eat solid foods after midnight.  You may have clear liquids until 5 AM upon the day of the procedure. ? ?3. Labs: You will need to have blood drawn today. ? ?4. Medication instructions in preparation for your procedure: ? ?  ?Stop taking, HTCZ (Hydrochlorothiazide) Thursday, April 20, (hyzaar) ? ? ? ?On the morning of your procedure, take Aspirin and Plavix/Clopidogrel and any morning medicines  NOT listed above.  You may use sips of water. ? ?5. Plan to go home the same day, you will only stay overnight if medically necessary. ?6. You MUST have a responsible adult to drive you home. ?7. An adult MUST be with you the first 24 hours after you arrive home. ?8. Bring a current list of your medications, and the last time and date medication taken. ?9. Bring ID  and current insurance cards. ?10.Please wear clothes that are easy to get on and off and wear slip-on shoes. ? ?Thank you for allowing [place] to care for you! ?  -- [facility] ? ? ?

## 2022-01-23 LAB — BASIC METABOLIC PANEL
BUN/Creatinine Ratio: 10 (ref 10–24)
BUN: 18 mg/dL (ref 8–27)
CO2: 23 mmol/L (ref 20–29)
Calcium: 9.4 mg/dL (ref 8.6–10.2)
Chloride: 101 mmol/L (ref 96–106)
Creatinine, Ser: 1.75 mg/dL — ABNORMAL HIGH (ref 0.76–1.27)
Glucose: 91 mg/dL (ref 70–99)
Potassium: 4.1 mmol/L (ref 3.5–5.2)
Sodium: 137 mmol/L (ref 134–144)
eGFR: 42 mL/min/{1.73_m2} — ABNORMAL LOW (ref >59)

## 2022-01-23 LAB — CBC
Hematocrit: 40.5 % (ref 37.5–51.0)
Hemoglobin: 13.9 g/dL (ref 13.0–17.7)
MCH: 30.5 pg (ref 26.6–33.0)
MCHC: 34.3 g/dL (ref 31.5–35.7)
MCV: 89 fL (ref 79–97)
Platelets: 188 10*3/uL (ref 150–450)
RBC: 4.55 x10E6/uL (ref 4.14–5.80)
RDW: 12.2 % (ref 11.6–15.4)
WBC: 7.1 10*3/uL (ref 3.4–10.8)

## 2022-01-28 ENCOUNTER — Other Ambulatory Visit: Payer: Self-pay

## 2022-01-28 DIAGNOSIS — I739 Peripheral vascular disease, unspecified: Secondary | ICD-10-CM

## 2022-01-28 MED ORDER — SODIUM CHLORIDE 0.9% FLUSH: 3.0000 mL | Freq: Two times a day (BID) | INTRAVENOUS | Status: DC

## 2022-01-29 ENCOUNTER — Telehealth: Payer: Self-pay | Admitting: Cardiovascular Disease

## 2022-01-29 NOTE — Telephone Encounter (Signed)
Pt returning call regarding test results. Please advise ?

## 2022-01-30 NOTE — Telephone Encounter (Signed)
Spoke to pt yesterday. See lab result notes. ?

## 2022-02-12 ENCOUNTER — Telehealth: Payer: Self-pay | Admitting: *Deleted

## 2022-02-12 NOTE — Telephone Encounter (Signed)
Abdominal aortogram scheduled at [facility] for: Thursday February 13, 2022 11:30 AM ?Arrival time and place: [place] Entrance A at: 6:30 AM-pre-procedure hydration ? ? ?No solid food after midnight prior to cath, clear liquids until 5 AM day of procedure. ? ?Medication instructions: ?-Hold: ? Losartan/HCTZ- day before and day of procedure-per protocol- GFR 42-pt already taken today ?-Except hold medications usual morning medications can be taken with sips of water including aspirin 81 mg and Plavix 75 mg. ? ?Confirmed patient has responsible adult to drive home post procedure and be with patient first 24 hours after arriving home. ? ?Patient reports no new symptoms concerning for COVID-19/no exposure to COVID-19 in the past 10 days. ? ?Reviewed procedure instructions with patient.  ?

## 2022-02-13 ENCOUNTER — Encounter (HOSPITAL_COMMUNITY)
Admission: RE | Disposition: A | Discharge status: Home / Self Care | Payer: Self-pay | Source: Home / Self Care | Attending: Cardiovascular Disease

## 2022-02-13 ENCOUNTER — Ambulatory Visit (HOSPITAL_BASED_OUTPATIENT_CLINIC_OR_DEPARTMENT_OTHER)
Admission: RE | Admit: 2022-02-13 | Discharge: 2022-02-14 | Disposition: A | Discharge status: Home / Self Care | Payer: BC Managed Care – PPO | Source: Home / Self Care | Attending: Cardiovascular Disease | Admitting: Cardiovascular Disease

## 2022-02-13 ENCOUNTER — Other Ambulatory Visit: Payer: Self-pay

## 2022-02-13 DIAGNOSIS — Z951 Presence of aortocoronary bypass graft: Secondary | ICD-10-CM | POA: Insufficient documentation

## 2022-02-13 DIAGNOSIS — G4730 Sleep apnea, unspecified: Secondary | ICD-10-CM | POA: Insufficient documentation

## 2022-02-13 DIAGNOSIS — I10 Essential (primary) hypertension: Secondary | ICD-10-CM | POA: Insufficient documentation

## 2022-02-13 DIAGNOSIS — T81718A Complication of other artery following a procedure, not elsewhere classified, initial encounter: Secondary | ICD-10-CM | POA: Diagnosis not present

## 2022-02-13 DIAGNOSIS — I739 Peripheral vascular disease, unspecified: Secondary | ICD-10-CM | POA: Diagnosis present

## 2022-02-13 DIAGNOSIS — I70211 Atherosclerosis of native arteries of extremities with intermittent claudication, right leg: Secondary | ICD-10-CM | POA: Diagnosis not present

## 2022-02-13 DIAGNOSIS — F17210 Nicotine dependence, cigarettes, uncomplicated: Secondary | ICD-10-CM | POA: Insufficient documentation

## 2022-02-13 DIAGNOSIS — E785 Hyperlipidemia, unspecified: Secondary | ICD-10-CM | POA: Insufficient documentation

## 2022-02-13 DIAGNOSIS — I2510 Atherosclerotic heart disease of native coronary artery without angina pectoris: Secondary | ICD-10-CM | POA: Insufficient documentation

## 2022-02-13 DIAGNOSIS — I70213 Atherosclerosis of native arteries of extremities with intermittent claudication, bilateral legs: Secondary | ICD-10-CM | POA: Insufficient documentation

## 2022-02-13 HISTORY — PX: PERIPHERAL VASCULAR ATHERECTOMY: CATH118256

## 2022-02-13 HISTORY — PX: ABDOMINAL AORTOGRAM W/LOWER EXTREMITY: CATH118223

## 2022-02-13 LAB — POCT ACTIVATED CLOTTING TIME
Activated Clotting Time: 329 seconds
Activated Clotting Time: 299 seconds
Activated Clotting Time: 191 seconds

## 2022-02-13 SURGERY — ABDOMINAL AORTOGRAM W/LOWER EXTREMITY
Anesthesia: LOCAL

## 2022-02-13 MED ORDER — ASPIRIN EC 81 MG PO TBEC
81.0000 mg | DELAYED_RELEASE_TABLET | Freq: Every day | ORAL | Status: DC
  Administered 2022-02-14: 81 mg via ORAL
  Filled 2022-02-13: qty 1

## 2022-02-13 MED ORDER — LABETALOL HCL 5 MG/ML IV SOLN: 10.0000 mg | INTRAVENOUS | Status: DC | PRN

## 2022-02-13 MED ORDER — ROSUVASTATIN CALCIUM 5 MG PO TABS
5.0000 mg | ORAL_TABLET | Freq: Every day | ORAL | Status: DC
  Administered 2022-02-13: 5 mg via ORAL
  Filled 2022-02-13 (×2): qty 1

## 2022-02-13 MED ORDER — HEPARIN SODIUM (PORCINE) 1000 UNIT/ML IJ SOLN
INTRAMUSCULAR | Status: DC | PRN
  Administered 2022-02-13: 10000 [IU] via INTRAVENOUS

## 2022-02-13 MED ORDER — NITROGLYCERIN 0.4 MG SL SUBL: 0.4000 mg | SUBLINGUAL_TABLET | SUBLINGUAL | Status: DC | PRN

## 2022-02-13 MED ORDER — SODIUM CHLORIDE 0.9 % WEIGHT BASED INFUSION: 1.0000 mL/kg/h | INTRAVENOUS | Status: DC

## 2022-02-13 MED ORDER — PANTOPRAZOLE SODIUM 40 MG PO TBEC
40.0000 mg | DELAYED_RELEASE_TABLET | Freq: Every day | ORAL | Status: DC
  Administered 2022-02-14: 40 mg via ORAL
  Filled 2022-02-13: qty 1

## 2022-02-13 MED ORDER — SODIUM CHLORIDE 0.9% FLUSH
3.0000 mL | Freq: Two times a day (BID) | INTRAVENOUS | Status: DC
  Administered 2022-02-14: 3 mL via INTRAVENOUS

## 2022-02-13 MED ORDER — HYDRALAZINE HCL 20 MG/ML IJ SOLN
INTRAMUSCULAR | Status: AC
  Filled 2022-02-13: qty 1

## 2022-02-13 MED ORDER — HEPARIN (PORCINE) IN NACL 1000-0.9 UT/500ML-% IV SOLN
INTRAVENOUS | Status: DC | PRN
Start: 2022-02-13 — End: 2022-02-13
  Administered 2022-02-13 (×3): 500 mL

## 2022-02-13 MED ORDER — CLOPIDOGREL BISULFATE 75 MG PO TABS
75.0000 mg | ORAL_TABLET | Freq: Every day | ORAL | Status: DC
  Administered 2022-02-14: 75 mg via ORAL
  Filled 2022-02-13: qty 1

## 2022-02-13 MED ORDER — IODIXANOL 320 MG/ML IV SOLN
INTRAVENOUS | Status: DC | PRN
  Administered 2022-02-13: 130 mL

## 2022-02-13 MED ORDER — HYDRALAZINE HCL 20 MG/ML IJ SOLN: 5.0000 mg | INTRAMUSCULAR | Status: DC | PRN

## 2022-02-13 MED ORDER — ASPIRIN EC 81 MG PO TBEC: 81.0000 mg | DELAYED_RELEASE_TABLET | Freq: Every day | ORAL | Status: DC

## 2022-02-13 MED ORDER — LIDOCAINE HCL (PF) 1 % IJ SOLN
INTRAMUSCULAR | Status: AC
  Filled 2022-02-13: qty 30

## 2022-02-13 MED ORDER — SODIUM CHLORIDE 0.9 % IV SOLN: 250.0000 mL | INTRAVENOUS | Status: DC | PRN

## 2022-02-13 MED ORDER — LOSARTAN POTASSIUM-HCTZ 100-12.5 MG PO TABS: 1.0000 | ORAL_TABLET | Freq: Every day | ORAL | Status: DC

## 2022-02-13 MED ORDER — SODIUM CHLORIDE 0.9 % IV SOLN: INTRAVENOUS | Status: AC

## 2022-02-13 MED ORDER — MIDAZOLAM HCL 2 MG/2ML IJ SOLN
INTRAMUSCULAR | Status: DC | PRN
  Administered 2022-02-13: 1 mg via INTRAVENOUS

## 2022-02-13 MED ORDER — MIDAZOLAM HCL 2 MG/2ML IJ SOLN
INTRAMUSCULAR | Status: AC
  Filled 2022-02-13: qty 2

## 2022-02-13 MED ORDER — LIDOCAINE HCL (PF) 1 % IJ SOLN
INTRAMUSCULAR | Status: DC | PRN
  Administered 2022-02-13: 20 mL

## 2022-02-13 MED ORDER — HEPARIN (PORCINE) IN NACL 1000-0.9 UT/500ML-% IV SOLN
INTRAVENOUS | Status: AC
Start: 2022-02-13 — End: ?
  Filled 2022-02-13: qty 1000

## 2022-02-13 MED ORDER — SODIUM CHLORIDE 0.9% FLUSH: 3.0000 mL | INTRAVENOUS | Status: DC | PRN

## 2022-02-13 MED ORDER — SODIUM CHLORIDE 0.9 % WEIGHT BASED INFUSION
3.0000 mL/kg/h | INTRAVENOUS | Status: DC
  Administered 2022-02-13: 3 mL/kg/h via INTRAVENOUS

## 2022-02-13 MED ORDER — ONDANSETRON HCL 4 MG/2ML IJ SOLN: 4.0000 mg | Freq: Four times a day (QID) | INTRAMUSCULAR | Status: DC | PRN

## 2022-02-13 MED ORDER — HYDROCHLOROTHIAZIDE 12.5 MG PO TABS
12.5000 mg | ORAL_TABLET | Freq: Every day | ORAL | Status: DC
  Administered 2022-02-14: 12.5 mg via ORAL
  Filled 2022-02-13: qty 1

## 2022-02-13 MED ORDER — CLOPIDOGREL BISULFATE 300 MG PO TABS
ORAL_TABLET | ORAL | Status: AC
  Filled 2022-02-13: qty 1

## 2022-02-13 MED ORDER — CLOPIDOGREL BISULFATE 75 MG PO TABS: 75.0000 mg | ORAL_TABLET | Freq: Every day | ORAL | Status: DC

## 2022-02-13 MED ORDER — HEPARIN (PORCINE) IN NACL 1000-0.9 UT/500ML-% IV SOLN
INTRAVENOUS | Status: AC
  Filled 2022-02-13: qty 500

## 2022-02-13 MED ORDER — ACETAMINOPHEN 325 MG PO TABS: 650.0000 mg | ORAL_TABLET | ORAL | Status: DC | PRN

## 2022-02-13 MED ORDER — ASPIRIN 81 MG PO CHEW: 81.0000 mg | CHEWABLE_TABLET | ORAL | Status: DC

## 2022-02-13 MED ORDER — FENTANYL CITRATE (PF) 100 MCG/2ML IJ SOLN
INTRAMUSCULAR | Status: DC | PRN
  Administered 2022-02-13: 25 ug via INTRAVENOUS

## 2022-02-13 MED ORDER — CLOPIDOGREL BISULFATE 300 MG PO TABS
ORAL_TABLET | ORAL | Status: DC | PRN
  Administered 2022-02-13: 300 mg via ORAL

## 2022-02-13 MED ORDER — FENTANYL CITRATE (PF) 100 MCG/2ML IJ SOLN
INTRAMUSCULAR | Status: AC
  Filled 2022-02-13: qty 2

## 2022-02-13 MED ORDER — MORPHINE SULFATE (PF) 2 MG/ML IV SOLN
INTRAVENOUS | Status: AC
  Filled 2022-02-13: qty 1

## 2022-02-13 MED ORDER — MORPHINE SULFATE (PF) 2 MG/ML IV SOLN
2.0000 mg | INTRAVENOUS | Status: DC | PRN
  Administered 2022-02-13: 2 mg via INTRAVENOUS

## 2022-02-13 MED ORDER — LOSARTAN POTASSIUM 50 MG PO TABS
100.0000 mg | ORAL_TABLET | Freq: Every day | ORAL | Status: DC
  Administered 2022-02-14: 100 mg via ORAL
  Filled 2022-02-13: qty 2

## 2022-02-13 MED ORDER — HYDRALAZINE HCL 20 MG/ML IJ SOLN
INTRAMUSCULAR | Status: DC | PRN
  Administered 2022-02-13 (×2): 5 mg
  Administered 2022-02-13: 10 mg via INTRAVENOUS

## 2022-02-13 SURGICAL SUPPLY — 20 items
BAG SNAP BAND KOVER 36X36 (MISCELLANEOUS) ×1 IMPLANT
BALLN IN.PACT DCB 5X150 (BALLOONS) ×3
CATH ANGIO 5F PIGTAIL 65CM (CATHETERS) ×1 IMPLANT
CATH CROSS OVER TEMPO 5F (CATHETERS) ×1 IMPLANT
CATH HAWKONE LX EXTENDED TIP (CATHETERS) ×1 IMPLANT
CATH STRAIGHT 5FR 65CM (CATHETERS) ×1 IMPLANT
DCB IN.PACT 5X150 (BALLOONS) IMPLANT
DEVICE CONTINUOUS FLUSH (MISCELLANEOUS) ×1 IMPLANT
DEVICE SPIDERFX EMB PROT 6MM (WIRE) ×1 IMPLANT
KIT ENCORE 26 ADVANTAGE (KITS) ×1 IMPLANT
KIT PV (KITS) ×3 IMPLANT
SHEATH HIGHFLEX ANSEL 7FR 55CM (SHEATH) ×1 IMPLANT
SHEATH PINNACLE 5F 10CM (SHEATH) ×1 IMPLANT
SHEATH PINNACLE 7F 10CM (SHEATH) ×1 IMPLANT
SYR MEDRAD MARK 7 150ML (SYRINGE) ×3 IMPLANT
TAPE SHOOT N SEE (TAPE) ×1 IMPLANT
TRANSDUCER W/STOPCOCK (MISCELLANEOUS) ×3 IMPLANT
TRAY PV CATH (CUSTOM PROCEDURE TRAY) ×3 IMPLANT
WIRE HITORQ VERSACORE ST 145CM (WIRE) ×1 IMPLANT
WIRE SHEPHERD 6G .014 (WIRE) ×1 IMPLANT

## 2022-02-13 NOTE — Plan of Care (Signed)

## 2022-02-13 NOTE — Progress Notes (Addendum)
ACT was checked because of hematoma above the site. ACT was 191 pressure applied to site was reduced. Monitored for 15 minutes, site started bleed around the sheath . Order for sheath removal verified per post procedural orders. Procedure explained to patient and Lt femoral artery access site assessed: level 0 after pressure was applied prior to removal , doppler dorsalis pedis and posterior tibial pulses. 7 [place] Sheath removed and manual pressure applied for 30 minutes. Pre, peri, & post procedural vitals: HR 65-70, RR 15-20, O2 Sat upper 100, BP 170/-17570's, meds given before sheath pull. Pain 0. Distal pulses remained intact after sheath removal. Access site level 0, bruising to site and dressed with 4X4 gauze and tegaderm.  Post procedural instructions discussed and return demonstration from patient.  ?  ?

## 2022-02-14 ENCOUNTER — Encounter (HOSPITAL_COMMUNITY): Payer: Self-pay | Admitting: Cardiovascular Disease

## 2022-02-14 DIAGNOSIS — I739 Peripheral vascular disease, unspecified: Secondary | ICD-10-CM | POA: Diagnosis not present

## 2022-02-14 LAB — BASIC METABOLIC PANEL
Anion gap: 4 — ABNORMAL LOW (ref 5–15)
BUN: 20 mg/dL (ref 8–23)
CO2: 22 mmol/L (ref 22–32)
Calcium: 8.3 mg/dL — ABNORMAL LOW (ref 8.9–10.3)
Chloride: 108 mmol/L (ref 98–111)
Creatinine, Ser: 1.64 mg/dL — ABNORMAL HIGH (ref 0.61–1.24)
GFR, Estimated: 46 mL/min — ABNORMAL LOW (ref >60)
Glucose, Bld: 108 mg/dL — ABNORMAL HIGH (ref 70–99)
Potassium: 4.1 mmol/L (ref 3.5–5.1)
Sodium: 134 mmol/L — ABNORMAL LOW (ref 135–145)

## 2022-02-14 LAB — LIPID PANEL
Cholesterol: 132 mg/dL (ref 0–200)
HDL: 31 mg/dL — ABNORMAL LOW (ref >40)
LDL Cholesterol: 77 mg/dL (ref 0–99)
Total CHOL/HDL Ratio: 4.3 RATIO
Triglycerides: 120 mg/dL (ref <150)
VLDL: 24 mg/dL (ref 0–40)

## 2022-02-14 LAB — CBC
HCT: 35.5 % — ABNORMAL LOW (ref 39.0–52.0)
Hemoglobin: 12.2 g/dL — ABNORMAL LOW (ref 13.0–17.0)
MCH: 30.7 pg (ref 26.0–34.0)
MCHC: 34.4 g/dL (ref 30.0–36.0)
MCV: 89.2 fL (ref 80.0–100.0)
Platelets: 144 10*3/uL — ABNORMAL LOW (ref 150–400)
RBC: 3.98 MIL/uL — ABNORMAL LOW (ref 4.22–5.81)
RDW: 12.1 % (ref 11.5–15.5)
WBC: 7.8 10*3/uL (ref 4.0–10.5)
nRBC: 0 % (ref 0.0–0.2)

## 2022-02-14 NOTE — Discharge Summary (Addendum)
?Discharge Summary  ?  ?Patient ID: [name] ?MRN: [id number]; DOB: 1956/01/11 ? ?Admit date: 02/13/2022 ?Discharge date: 02/14/2022 ? ?PCP:  Susy Frizzle, MD ?  ?[facility] Providers ?Cardiologist:  Jenkins Rouge, MD      ?[facility]: Quay Burow, MD ? ? ?Discharge Diagnoses  ?  ?Principal Problem: ?  Claudication in peripheral vascular disease ([place]) ?Active Problems: ?  Peripheral arterial disease ([place]) ? ? ? ?Diagnostic Studies/Procedures  ?  ?History obtained from chart review.[name] is a 66 y.o. mildly overweight married Caucasian male father of 69, grandfather to 1 grandchild who is accompanied by his wife Juliann Pulse today.  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  He was referred by Dr. Paulla Dolly, his podiatrist, for symptomatic PAD.  He has a history of CAD having undergone coronary artery bypass grafting x4 by Dr. Cyndia Bent in 2014.  He has treated hypertension, hyperlipidemia and obstructive sleep apnea.  He has a long history tobacco abuse smoking 100 pack years now smoking 1 pack a day down from 2 packs a day.  I catheterized him 04/19/2018 and stented his circumflex.  His heart is stable.  He has had claudication right equal to left for at least a year with Dopplers that show high-grade mid right SFA stenosis and moderate left SFA stenosis.  He wishes to proceed with angiography and intervention. ?  ?Pre Procedure Diagnosis: Peripheral arterial disease/claudication ?  ?Post Procedure Diagnosis: Peripheral arterial disease/claudication ?  ?Operators: Dr. Quay Burow ?  ?Procedures Performed: ?            1.  Ultrasound-guided left common femoral access ?            2.  Abdominal aortogram/bilateral iliac angiogram/bifemoral runoff ?            3.  Contralateral access (second-order catheter placement) ?            4.  Placement of a spider distal protection device right popliteal artery ?            5.  Hawk 1 directional atherectomy followed by [facility] mid  right SFA ?  ?PROCEDURE DESCRIPTION:  ?  ?The patient was brought to the second floor [facility] cath lab in the the postabsorptive state. He was premedicated with IV Versed and fentanyl. His left groin was prepped and shaved in usual sterile fashion. Xylocaine 1% was used for local anesthesia. A 5 French sheath was inserted into the left common femoral artery using standard Seldinger technique.  Ultrasound was used to identify the left common femoral and guide access.  Digital image was captured and placed in the patient's chart.  A 5 French pigtail catheter was placed in distal abdominal aorta.  Distal abdominal aortography, bilateral iliac angiography with bifemoral runoff was performed using bolus chase, digital subtraction and step table technique.  Omnipaque dye was used for the entirety of the case (130 cc of contrast total to patient).  Retrograde aortic pressures monitored in the case. ?  ?  ?Angiographic Data:  ?  ?1: Abdominal aorta-widely patent ?2: Left lower extremity-tandem 90% stenoses in the proximal, mid, and distal left SFA as well as in the P1 segment of the left popliteal artery. ?3: 60% segmental mid right SFA stenosis with a focal 95% stenosis at the distal edge of this.  There was a 50% distal right SFA stenosis with two-vessel runoff (anterior tibial is occluded). ?  ?IMPRESSION: Mr.  [name] has bilateral SFA disease and lifestyle-limiting claudication.  We will proceed with [facility] 1 directional atherectomy followed by [facility] of the mid right SFA using spider distal protection.  The patient was already on aspirin and clopidogrel at home. ?  ?Procedure Description: Contralateral access was obtained with a crossover catheter, endhole catheter and versa core wire.  I exchanged the 5 [place] sheath for a 7 French 55 cm multipurpose Ansell sheath and placed it in the right common femoral artery.  I then was able with some difficulty to cross the diseased segment with an 014 Shepperd 6 g wire  which I placed in the right popliteal artery.  I then placed a 6 mm spider distal protection device over this in the P2 segment of the right popliteal artery. ? ?Following this I performed directional atherectomy with a Hawk 1 LX device performing multiple circumferential cuts removing a copious amount of atherosclerotic plaque.  Following this I performed DCB with a 5 mm x 150 mm long Medtronic impact Admiral DCB at 4 atm for 2 and half minutes resulting in reduction of a 60% segmental followed by 95% fairly focal lesion to less than 20% residual.  The patient followed the procedure well.  The spider distal protection device was then captured and removed from the body.  The Ansell sheath was withdrawn over an 035 versa core wire and exchanged for a short 7 [place] sheath which was then secured in place.  The patient received an additional 300 mg of p.o. Plavix. ?  ?Final Impression: Successful right SFA Hawk 1 directional atherectomy followed by [facility] with spider distal protection.  There was plaque captured in the spider distal protection Notes:.  The sheath will be removed once ACT falls below and 70 and pressure held.  The patient will be hydrated overnight given his renal insufficiency and discharged home in the morning on DAPT.  We will obtain lower extremity arterial Doppler studies in our [facility] line office next week and I will see him back a week or 2 thereafter.  He left the lab in stable condition. ?  ?  ?  ?Quay Burow. MD, [facility] ?02/13/2022 ?12:07 PM ?  ?_____________ ?  ?History of Present Illness   ?  ?ISHMEL [name] is a 66 y.o. male with CABG times 01/2013, DES CFX 2019, HTN, HLD, OSA, symptomatic PAD, ongoing tobacco use. ? ?He was seen in the office by Dr. Gwenlyn Found on 3/29 for lifestyle limiting claudication symptoms.  He was scheduled for PV catheterization and came to the hospital on 4/20 for the procedure. ? ?Hospital Course  ?   ?Consultants: None ? ?Complete record of the catheterization and  intervention as above. ? ?ACT was checked because of hematoma above the site. ACT was 191 pressure applied to site was reduced. Monitored for 15 minutes, site started bleed around the sheath . Order for sheath removal verified per post procedural orders. Procedure explained to patient and Lt femoral artery access site assessed: level 0 after pressure was applied prior to removal , doppler dorsalis pedis and posterior tibial pulses. 7 [place] Sheath removed and manual pressure applied for 30 minutes. Pre, peri, & post procedural vitals: HR 65-70, RR 15-20, O2 Sat upper 100, BP 170/-17570's, meds given before sheath pull. Pain 0. Distal pulses remained intact after sheath removal. Access site level 0, bruising to site and dressed with 4X4 gauze and tegaderm.  Post procedural instructions discussed and return demonstration from patient.  ? ?His hemoglobin dropped from 13.9>>  12.2 and his creatinine went from 1.75>> 1.64 post procedure.  This can be followed as an outpatient. ? ?On 4/21, he was seen by Dr. Sallyanne Kuster and all data were reviewed.  He has extensive ecchymosis in his groin and the upper part of his leg.  This will be marked by the nursing staff so that he can track it and contact us if it is getting worse.  He was already on aspirin and Plavix and is to continue this. ? ?Other than that, he was doing well and ambulating without chest pain or shortness of breath.  He stated his leg felt much better. ? ?No further inpatient work-up is indicated and he is considered stable for discharge, to follow-up as an outpatient.  Outpatient follow-up has been arranged. ? ?_____________ ? ?Discharge Vitals ?Blood pressure (!) 159/80, pulse 70, temperature 98 ?F (36.7 ?C), temperature source Oral, resp. rate 17, height '5\' 9"'  (1.753 m), weight 95.3 kg, SpO2 99 %.  Danley Danker Weights  ? 02/13/22 0703  ?Weight: 95.3 kg  ?General: Well developed, well nourished, male in no acute distress ?Head: Eyes PERRLA, Head normocephalic and  atraumatic ?Lungs: clear bilaterally to auscultation. ?Heart: HRRR S1 S2, without rub or gallop. No murmur. 4/4 extremity pulses are 2+ & equal. No JVD. ?Abdomen: Bowel sounds are present, abdomen soft and non-ten

## 2022-02-14 NOTE — Discharge Instructions (Signed)
Your hematoma was marked.  Contact Dr. Gwenlyn Found if the bruising spreads significantly or there is any swelling in the area. ? ?PLEASE REMEMBER TO BRING ALL OF YOUR MEDICATIONS TO EACH OF YOUR FOLLOW-UP OFFICE VISITS. ? ?PLEASE ATTEND ALL SCHEDULED FOLLOW-UP APPOINTMENTS.  ? ?Activity: Increase activity slowly as tolerated. You may shower, but no soaking baths (or swimming) for 1 week. No driving for 2 days. No lifting over 5 lbs for 1 week. No sexual activity for 1 week.  ? ?You May Return to Work: in 1 week (if applicable) ? ?Wound Care: You may wash cath site gently with soap and water. Keep cath site clean and dry. If you notice pain, swelling, bleeding or pus at your cath site, please call [phone]. ? ? ? ?PV Cath Site Care ?Refer to this sheet in the next few weeks. These instructions provide you with information on caring for yourself after your procedure. Your caregiver may also give you more specific instructions. Your treatment has been planned according to current medical practices, but problems sometimes occur. Call your caregiver if you have any problems or questions after your procedure. ?HOME CARE INSTRUCTIONS ?You may shower 24 hours after the procedure. Remove the bandage (dressing) and gently wash the site with plain soap and water. Gently pat the site dry.  ?Do not apply powder or lotion to the site.  ?Do not sit in a bathtub, swimming pool, or whirlpool for 5 to 7 days.  ?No bending, squatting, or lifting anything over 10 pounds (4.5 kg) as directed by your caregiver.  ?Inspect the site at least twice daily.  ?Do not drive home if you are discharged the same day of the procedure. Have someone else drive you.  ?You may drive 24 hours after the procedure unless otherwise instructed by your caregiver.  ?What to expect: ?Any bruising will usually fade within 1 to 2 weeks.  ?Blood that collects in the tissue (hematoma) may be painful to the touch. It should usually decrease in size and tenderness  within 1 to 2 weeks.  ?SEEK IMMEDIATE MEDICAL CARE IF: ?You have unusual pain at the site or down the affected limb.  ?You have redness, warmth, swelling, or pain at the site.  ?You have drainage (other than a small amount of blood on the dressing).  ?You have chills.  ?You have a fever or persistent symptoms for more than 72 hours.  ?You have a fever and your symptoms suddenly get worse.  ?Your leg becomes pale, cool, tingly, or numb.  ?You have heavy bleeding from the site. Hold pressure on the site.  ?Document Released: 11/15/2010 Document Revised: 10/02/2011 Document Reviewed: 11/15/2010 ?ExitCare? Patient Information ?[address] [place]. ? ?

## 2022-02-16 ENCOUNTER — Emergency Department (HOSPITAL_COMMUNITY): Payer: BC Managed Care – PPO

## 2022-02-16 ENCOUNTER — Encounter (HOSPITAL_COMMUNITY): Payer: Self-pay | Admitting: Emergency Medicine

## 2022-02-16 ENCOUNTER — Other Ambulatory Visit: Payer: Self-pay

## 2022-02-16 ENCOUNTER — Telehealth: Payer: Self-pay | Admitting: Physician Assistant

## 2022-02-16 ENCOUNTER — Inpatient Hospital Stay (HOSPITAL_COMMUNITY)
Admission: EM | Admit: 2022-02-16 | Discharge: 2022-02-18 | DRG: 254 | Disposition: A | Discharge status: Home / Self Care | Payer: BC Managed Care – PPO | Attending: Cardiovascular Disease | Admitting: Cardiovascular Disease

## 2022-02-16 DIAGNOSIS — N1831 Chronic kidney disease, stage 3a: Secondary | ICD-10-CM | POA: Diagnosis present

## 2022-02-16 DIAGNOSIS — I739 Peripheral vascular disease, unspecified: Secondary | ICD-10-CM | POA: Diagnosis present

## 2022-02-16 DIAGNOSIS — I70222 Atherosclerosis of native arteries of extremities with rest pain, left leg: Secondary | ICD-10-CM | POA: Diagnosis present

## 2022-02-16 DIAGNOSIS — Z809 Family history of malignant neoplasm, unspecified: Secondary | ICD-10-CM

## 2022-02-16 DIAGNOSIS — Y838 Other surgical procedures as the cause of abnormal reaction of the patient, or of later complication, without mention of misadventure at the time of the procedure: Secondary | ICD-10-CM | POA: Diagnosis present

## 2022-02-16 DIAGNOSIS — F17210 Nicotine dependence, cigarettes, uncomplicated: Secondary | ICD-10-CM | POA: Diagnosis present

## 2022-02-16 DIAGNOSIS — Z79899 Other long term (current) drug therapy: Secondary | ICD-10-CM | POA: Diagnosis not present

## 2022-02-16 DIAGNOSIS — R1030 Lower abdominal pain, unspecified: Secondary | ICD-10-CM | POA: Diagnosis not present

## 2022-02-16 DIAGNOSIS — I2510 Atherosclerotic heart disease of native coronary artery without angina pectoris: Secondary | ICD-10-CM | POA: Diagnosis present

## 2022-02-16 DIAGNOSIS — Z7902 Long term (current) use of antithrombotics/antiplatelets: Secondary | ICD-10-CM | POA: Diagnosis not present

## 2022-02-16 DIAGNOSIS — E785 Hyperlipidemia, unspecified: Secondary | ICD-10-CM | POA: Diagnosis present

## 2022-02-16 DIAGNOSIS — K219 Gastro-esophageal reflux disease without esophagitis: Secondary | ICD-10-CM | POA: Diagnosis present

## 2022-02-16 DIAGNOSIS — Z885 Allergy status to narcotic agent status: Secondary | ICD-10-CM

## 2022-02-16 DIAGNOSIS — E781 Pure hyperglyceridemia: Secondary | ICD-10-CM | POA: Diagnosis present

## 2022-02-16 DIAGNOSIS — G4733 Obstructive sleep apnea (adult) (pediatric): Secondary | ICD-10-CM | POA: Diagnosis present

## 2022-02-16 DIAGNOSIS — Z888 Allergy status to other drugs, medicaments and biological substances status: Secondary | ICD-10-CM | POA: Diagnosis not present

## 2022-02-16 DIAGNOSIS — Z8249 Family history of ischemic heart disease and other diseases of the circulatory system: Secondary | ICD-10-CM

## 2022-02-16 DIAGNOSIS — Z955 Presence of coronary angioplasty implant and graft: Secondary | ICD-10-CM

## 2022-02-16 DIAGNOSIS — Z7982 Long term (current) use of aspirin: Secondary | ICD-10-CM

## 2022-02-16 DIAGNOSIS — I724 Aneurysm of artery of lower extremity: Secondary | ICD-10-CM | POA: Diagnosis present

## 2022-02-16 DIAGNOSIS — T81718A Complication of other artery following a procedure, not elsewhere classified, initial encounter: Principal | ICD-10-CM | POA: Diagnosis present

## 2022-02-16 DIAGNOSIS — I129 Hypertensive chronic kidney disease with stage 1 through stage 4 chronic kidney disease, or unspecified chronic kidney disease: Secondary | ICD-10-CM | POA: Diagnosis present

## 2022-02-16 DIAGNOSIS — E782 Mixed hyperlipidemia: Principal | ICD-10-CM

## 2022-02-16 HISTORY — DX: Aneurysm of artery of lower extremity: I72.4

## 2022-02-16 LAB — BASIC METABOLIC PANEL
Anion gap: 9 (ref 5–15)
BUN: 22 mg/dL (ref 8–23)
CO2: 23 mmol/L (ref 22–32)
Calcium: 8.8 mg/dL — ABNORMAL LOW (ref 8.9–10.3)
Chloride: 105 mmol/L (ref 98–111)
Creatinine, Ser: 1.68 mg/dL — ABNORMAL HIGH (ref 0.61–1.24)
GFR, Estimated: 44 mL/min — ABNORMAL LOW (ref >60)
Glucose, Bld: 98 mg/dL (ref 70–99)
Potassium: 4.2 mmol/L (ref 3.5–5.1)
Sodium: 137 mmol/L (ref 135–145)

## 2022-02-16 LAB — CBC
HCT: 33.7 % — ABNORMAL LOW (ref 39.0–52.0)
Hemoglobin: 11.6 g/dL — ABNORMAL LOW (ref 13.0–17.0)
MCH: 31.4 pg (ref 26.0–34.0)
MCHC: 34.4 g/dL (ref 30.0–36.0)
MCV: 91.1 fL (ref 80.0–100.0)
Platelets: 140 10*3/uL — ABNORMAL LOW (ref 150–400)
RBC: 3.7 MIL/uL — ABNORMAL LOW (ref 4.22–5.81)
RDW: 12.1 % (ref 11.5–15.5)
WBC: 8.2 10*3/uL (ref 4.0–10.5)
nRBC: 0 % (ref 0.0–0.2)

## 2022-02-16 MED ORDER — LOSARTAN POTASSIUM 50 MG PO TABS
100.0000 mg | ORAL_TABLET | Freq: Every day | ORAL | Status: DC
  Administered 2022-02-16 – 2022-02-18 (×2): 100 mg via ORAL
  Filled 2022-02-16 (×3): qty 2

## 2022-02-16 MED ORDER — ASPIRIN EC 81 MG PO TBEC
81.0000 mg | DELAYED_RELEASE_TABLET | Freq: Every day | ORAL | Status: DC
Start: 2022-02-16 — End: 2022-02-18
  Administered 2022-02-16 – 2022-02-18 (×2): 81 mg via ORAL
  Filled 2022-02-16 (×2): qty 1

## 2022-02-16 MED ORDER — PANTOPRAZOLE SODIUM 40 MG PO TBEC
40.0000 mg | DELAYED_RELEASE_TABLET | Freq: Every day | ORAL | Status: DC
  Administered 2022-02-16 – 2022-02-18 (×2): 40 mg via ORAL
  Filled 2022-02-16 (×2): qty 1

## 2022-02-16 MED ORDER — ACETAMINOPHEN 500 MG PO TABS
1000.0000 mg | ORAL_TABLET | Freq: Four times a day (QID) | ORAL | Status: DC | PRN
  Administered 2022-02-18 (×2): 1000 mg via ORAL
  Filled 2022-02-16 (×2): qty 2

## 2022-02-16 MED ORDER — CLOPIDOGREL BISULFATE 75 MG PO TABS
75.0000 mg | ORAL_TABLET | Freq: Every day | ORAL | Status: DC
  Administered 2022-02-16 – 2022-02-18 (×2): 75 mg via ORAL
  Filled 2022-02-16 (×2): qty 1

## 2022-02-16 MED ORDER — HYDROCHLOROTHIAZIDE 12.5 MG PO TABS
12.5000 mg | ORAL_TABLET | Freq: Every day | ORAL | Status: DC
  Administered 2022-02-16 – 2022-02-18 (×2): 12.5 mg via ORAL
  Filled 2022-02-16 (×2): qty 1

## 2022-02-16 MED ORDER — LOSARTAN POTASSIUM-HCTZ 100-12.5 MG PO TABS: 1.0000 | ORAL_TABLET | Freq: Every day | ORAL | Status: DC

## 2022-02-16 MED ORDER — DIPHENOXYLATE-ATROPINE 2.5-0.025 MG PO TABS: 2.0000 | ORAL_TABLET | Freq: Four times a day (QID) | ORAL | Status: DC | PRN

## 2022-02-16 MED ORDER — ROSUVASTATIN CALCIUM 5 MG PO TABS
5.0000 mg | ORAL_TABLET | Freq: Every day | ORAL | Status: DC
  Administered 2022-02-16: 5 mg via ORAL
  Filled 2022-02-16 (×2): qty 1

## 2022-02-16 MED ORDER — SENNOSIDES-DOCUSATE SODIUM 8.6-50 MG PO TABS: 1.0000 | ORAL_TABLET | Freq: Every evening | ORAL | Status: DC | PRN

## 2022-02-16 MED ORDER — MORPHINE SULFATE (PF) 2 MG/ML IV SOLN
1.0000 mg | Freq: Four times a day (QID) | INTRAVENOUS | Status: DC | PRN
  Administered 2022-02-16: 1 mg via INTRAVENOUS
  Filled 2022-02-16: qty 1

## 2022-02-16 MED ORDER — SODIUM CHLORIDE 0.9% FLUSH
3.0000 mL | Freq: Two times a day (BID) | INTRAVENOUS | Status: DC
  Administered 2022-02-16 – 2022-02-18 (×3): 3 mL via INTRAVENOUS

## 2022-02-16 MED ORDER — POLYVINYL ALCOHOL 1.4 % OP SOLN: 1.0000 [drp] | Freq: Every day | OPHTHALMIC | Status: DC | PRN

## 2022-02-16 MED ORDER — RISAQUAD PO CAPS
1.0000 | ORAL_CAPSULE | Freq: Every day | ORAL | Status: DC
  Administered 2022-02-16 – 2022-02-18 (×2): 1 via ORAL
  Filled 2022-02-16 (×3): qty 1

## 2022-02-16 NOTE — ED Triage Notes (Signed)
Pt states he had a PV angiogram with stenting on Thursday.  Reports L groin swelling and pain at the cath site.  Called cardiologist and told to come to ED for eval. ?

## 2022-02-16 NOTE — Telephone Encounter (Signed)
Paged by answering service.  Patient underwent PV angiogram with stenting on Thursday.  Had a complication with hematoma and ecchymosis.  Discharge Friday.  Overnight patient woke up with pain.  Reports swelling at the cath site worsen/recur with pain.  Wife reports bruising has extended from marking site.  Will come to ER for further evaluation. ?

## 2022-02-16 NOTE — H&P (Addendum)
?Cardiology Admission History and Physical:  ? ?Patient ID: [name] ?MRN: [id number]; DOB: 04/10/1956  ? ?Admission date: 02/16/2022 ? ?PCP:  Susy Frizzle, MD ?  ?[facility] Providers ?Cardiologist:  Jenkins Rouge, MD      ? ? ?Chief Complaint:  left groin pain ? ?Patient Profile:  ? ?[name] is a 66 y.o. male with CAD with hx of CAD and CABG  X 4 in 2014 (LIMA-mid LAD, SVG-RPDA, SVG-1st diag, SVG-mid LCX (100%) occluded, s/p prox to mid LCX PCI 2019, HTN, HLD, OSA, symptomatic PAD, ongoing tobacco use.who is being seen 02/16/2022 for the evaluation of left groin pseudoaneurysm. ? ?History of Present Illness:  ? ?[name] is a 66 y.o. male with CAD with hx of CAD and CABG  X 4 in 2014 (LIMA-mid LAD, SVG-RPDA, SVG-1st diag, SVG-mid LCX (100%) occluded, s/p prox to mid LCX PCI 2019, HTN, HLD, OSA, symptomatic PAD, ongoing tobacco use.who is being seen 02/16/2022 for the evaluation of left groin pseudoaneurysm. ? ?Patient was here on 4/20 for peripheral angiogram with Dr Gwenlyn Found for claudication- underwent left femoral access and s/p hawk 1 directional atherectomy followed by [facility] of mid right SFA. Following procedure he had brusing, echymosis and hematoma- all of this was managed with manual pressure and site was marked. He was discharged the next day. Remains on aspirin and plavix. On 4/21 before discharge he had extensive ecchymosis of groin and upper thigh but was stable ? ?He comes back today c/o severe pain in the left groin with extensive brusing and swelling of his scrotum. Denies any fevers, chills, numbness. The leg sensation is intact ? ?ER work up: [place] left groin was performed and shows  ?Findings:  ? ?An area with well defined borders measuring 3.4 cm x 4.4 cm was visualized  arising off of the CFA with ultrasound characteristics of a pseudoaneurysm. The neck measures approximately 1.2 cm wide and 3.4 cm  long.  ?   ?Recent procedures: 02/13/22 ?Procedures Performed: ?            1.   Ultrasound-guided left common femoral access ?            2.  Abdominal aortogram/bilateral iliac angiogram/bifemoral runoff ?            3.  Contralateral access (second-order catheter placement) ?            4.  Placement of a spider distal protection device right popliteal artery ?            5.  Hawk 1 directional atherectomy followed by [facility] mid right SFA ? ?Angiographic Data:  ?  ?1: Abdominal aorta-widely patent ?2: Left lower extremity-tandem 90% stenoses in the proximal, mid, and distal left SFA as well as in the P1 segment of the left popliteal artery. ?3: 60% segmental mid right SFA stenosis with a focal 95% stenosis at the distal edge of this.  There was a 50% distal right SFA stenosis with two-vessel runoff (anterior tibial is occluded). ?  ?IMPRESSION: [name] has bilateral SFA disease and lifestyle-limiting claudication.  We will proceed with [facility] 1 directional atherectomy followed by [facility] of the mid right SFA using spider distal protection.  The patient was already on aspirin and clopidogrel at home. ?  ? ?Past Medical History:  ?Diagnosis Date  ? Cervical spine ankylosis   ? CKD (chronic kidney disease) stage 3, GFR 30-59 ml/min (HCC)   ? COLONIC POLYPS, HX OF   ?  CORONARY ARTERY DISEASE   ? a. s/p stent to RCA 2005. b. Stent to Cx 2007. c.  CABG X4 2014; d. LHC 04/19/18 occluded SVG-Circ, DES to in-stent restenosis of circ.   ? Diverticulosis   ? DIVERTICULOSIS, COLON   ? DYSPHAGIA UNSPECIFIED   ? intermittent  ? GERD   ? HEMORRHOIDS   ? HYPERLIPIDEMIA   ? HYPERTENSION   ? HYPERTRIGLYCERIDEMIA   ? Left carotid artery occlusion   ? OBESITY   ? Postoperative atrial fibrillation ([place]) 2014  ? Pseudoaneurysm of left femoral artery ([place]) 02/16/2022  ? SLEEP APNEA   ? on CPAP  ? Statin intolerance   ? Tobacco abuse   ? Vertebral artery occlusion, left   ? ? ?Past Surgical History:  ?Procedure Laterality Date  ? ABDOMINAL AORTOGRAM W/LOWER EXTREMITY N/A 02/13/2022  ? Procedure: ABDOMINAL AORTOGRAM W/LOWER  EXTREMITY;  Surgeon: Lorretta Harp, MD;  Location: [place] [facility];  Service: Cardiovascular;  Laterality: N/A;  ? ACHILLES TENDON REPAIR    ? BACK SURGERY    ? lower back  ? CARDIAC CATHETERIZATION    ? CORONARY ARTERY BYPASS GRAFT  08/18/2012  ? Procedure: CORONARY ARTERY BYPASS GRAFTING (CABG);  Surgeon: Gaye Pollack, MD;  Location: [place];  Service: Open Heart Surgery;  Laterality: N/A;  CABG x four;  using left internal mammary artery and right leg greater saphenous vein harvested endoscopically  ? CORONARY STENT INTERVENTION N/A 04/19/2018  ? Procedure: CORONARY STENT INTERVENTION;  Surgeon: Lorretta Harp, MD;  Location: [place] [facility];  Service: Cardiovascular;  Laterality: N/A;  ? KNEE ARTHROSCOPY    ? RIGHT  ? LEFT HEART CATH AND CORS/GRAFTS ANGIOGRAPHY N/A 04/19/2018  ? Procedure: LEFT HEART CATH AND CORS/GRAFTS ANGIOGRAPHY;  Surgeon: Lorretta Harp, MD;  Location: [place] [facility];  Service: Cardiovascular;  Laterality: N/A;  ? LUMBAR LAMINECTOMY/DECOMPRESSION MICRODISCECTOMY N/A 12/05/2015  ? Procedure: LUMBAR 2-3, LUMBAR 3-4 DECOMPRESSION ;  Surgeon: Phylliss Bob, MD;  Location: [place];  Service: Orthopedics;  Laterality: N/A;  ? PERIPHERAL VASCULAR ATHERECTOMY  02/13/2022  ? Procedure: PERIPHERAL VASCULAR ATHERECTOMY;  Surgeon: Lorretta Harp, MD;  Location: [place] [facility];  Service: Cardiovascular;;  Rt SFA  ? Right long finger extensor tendon repair    ?  ? ?Medications Prior to Admission: ?Prior to Admission medications   ?Medication Sig Start Date End Date Taking? Authorizing Provider  ?acetaminophen (TYLENOL) 500 MG tablet Take 1,000 mg by mouth every 6 (six) hours as needed for mild pain.    [provider]  ?aspirin EC 81 MG tablet Take 1 tablet (81 mg total) by mouth daily. 03/26/17   Isaiah Serge, NP  ?carboxymethylcellulose 1 % ophthalmic solution Place 1 drop into both eyes daily as needed (dry eyes).    [provider]  ?clopidogrel (PLAVIX) 75 MG  tablet TAKE 1 TABLET BY MOUTH ONCE A DAY ?Patient taking differently: Take 75 mg by mouth daily. 07/22/21   Josue Hector, MD  ?diphenoxylate-atropine (LOMOTIL) 2.5-0.025 MG tablet Take 2 tablets by mouth 4 (four) times daily as needed for diarrhea or loose stools. 06/25/21   Susy Frizzle, MD  ?losartan-hydrochlorothiazide [facility]) 100-12.5 MG tablet TAKE 1 TABLET BY MOUTH ONCE A DAY 07/30/21   Josue Hector, MD  ?nitroGLYCERIN (NITROSTAT) 0.4 MG SL tablet Place 1 tablet (0.4 mg total) under the tongue every 5 (five) minutes as needed for chest pain. 05/13/21 02/07/22  Josue Hector, MD  ?  pantoprazole (PROTONIX) 40 MG tablet TAKE 1 TABLET BY MOUTH ONCE A DAY ?Patient taking differently: Take 40 mg by mouth daily. 11/14/21   Josue Hector, MD  ?Probiotic Product (PROBIOTIC-10 ULTIMATE) CAPS Take 1 capsule by mouth daily.    [provider]  ?rosuvastatin (CRESTOR) 5 MG tablet TAKE 1 TABLET BY MOUTH ONCE A DAY ?Patient taking differently: Take 5 mg by mouth 2 (two) times a week. 07/30/21   Josue Hector, MD  ?  ? ?Allergies:    ?Allergies  ?Allergen Reactions  ? Statins Other (See Comments)  ?  Bone and muscle pain  ? Oxycodone Other (See Comments)  ? Hydrocodone Nausea And Vomiting  ? ? ?Social History:   ?Social History  ? ?Socioeconomic History  ? Marital status: Married  ?  Spouse name: Not on file  ? Number of children: Not on file  ? Years of education: Not on file  ? Highest education level: Not on file  ?Occupational History  ? Not on file  ?Tobacco Use  ? Smoking status: Every Day  ?  Packs/day: 1.50  ?  Years: 43.00  ?  Pack years: 64.50  ?  Types: Cigarettes  ? Smokeless tobacco: Never  ? Tobacco comments:  ?  Patient attempting to quit pt down to 1 ppd  ?Vaping Use  ? Vaping Use: Never used  ?Substance and Sexual Activity  ? Alcohol use: Yes  ?  Comment: 1-2 drink occasionally  ? Drug use: No  ? Sexual activity: Not on file  ?Other Topics Concern  ? Not on file  ?Social History Narrative   ? Not on file  ? ?Social Determinants of Health  ? ?Financial Resource Strain: Not on file  ?Food Insecurity: Not on file  ?Transportation Needs: Not on file  ?Physical Activity: Not on file  ?Stress:

## 2022-02-16 NOTE — ED Provider Notes (Signed)
Care of the patient assumed at signout.  Patient found to have pseudoaneurysm in the atherectomy femoral access site.  I discussed this with our ultrasound tech, and subsequently with her cardiology colleagues will admit the patient for appropriate monitoring, management. ?  ?Carmin Muskrat, MD ?02/16/22 1717 ? ?

## 2022-02-16 NOTE — ED Provider Notes (Signed)
?[facility] ?Provider Note ? ? ?CSN: [id number] ?Arrival date & time: 02/16/22  1411 ? ?  ? ?History ? ?Chief complaint: Leg swelling ? ?KEYION KNACK is a 66 y.o. male. ? ?HPI ? ?Patient has history of peripheral vascular disease.  He was seen by Dr. Alvester Chou and April 20thHad a peripheral vascular catheterization of his left lower extremity.  Patient ended up having a directional atherectomy.  Patient was discharged from the hospital on the 21st.  Patient started noticing increasing bruising and swelling this morning.  He is not feeling lightheaded or dizzy.  He contacted the cardiologist on-call who suggested he come to the ED for evaluation. ? ?Home Medications ?Prior to Admission medications   ?Medication Sig Start Date End Date Taking? Authorizing Provider  ?acetaminophen (TYLENOL) 500 MG tablet Take 1,000 mg by mouth every 6 (six) hours as needed for mild pain.    [provider]  ?aspirin EC 81 MG tablet Take 1 tablet (81 mg total) by mouth daily. 03/26/17   Isaiah Serge, NP  ?carboxymethylcellulose 1 % ophthalmic solution Place 1 drop into both eyes daily as needed (dry eyes).    [provider]  ?clopidogrel (PLAVIX) 75 MG tablet TAKE 1 TABLET BY MOUTH ONCE A DAY ?Patient taking differently: Take 75 mg by mouth daily. 07/22/21   Josue Hector, MD  ?diphenoxylate-atropine (LOMOTIL) 2.5-0.025 MG tablet Take 2 tablets by mouth 4 (four) times daily as needed for diarrhea or loose stools. 06/25/21   Susy Frizzle, MD  ?losartan-hydrochlorothiazide [facility]) 100-12.5 MG tablet TAKE 1 TABLET BY MOUTH ONCE A DAY 07/30/21   Josue Hector, MD  ?nitroGLYCERIN (NITROSTAT) 0.4 MG SL tablet Place 1 tablet (0.4 mg total) under the tongue every 5 (five) minutes as needed for chest pain. 05/13/21 02/07/22  Josue Hector, MD  ?pantoprazole (PROTONIX) 40 MG tablet TAKE 1 TABLET BY MOUTH ONCE A DAY ?Patient taking differently: Take 40 mg by mouth daily. 11/14/21    Josue Hector, MD  ?Probiotic Product (PROBIOTIC-10 ULTIMATE) CAPS Take 1 capsule by mouth daily.    [provider]  ?rosuvastatin (CRESTOR) 5 MG tablet TAKE 1 TABLET BY MOUTH ONCE A DAY ?Patient taking differently: Take 5 mg by mouth 2 (two) times a week. 07/30/21   Josue Hector, MD  ?   ? ?Allergies    ?Statins, Oxycodone, and Hydrocodone   ? ?Review of Systems   ?Review of Systems  ?Constitutional:  Negative for fever.  ? ?Physical Exam ?Updated Vital Signs ?BP (!) 161/77 (BP Location: Right Arm)   Pulse 80   Temp 98.4 ?F (36.9 ?C) (Oral)   Resp 18   SpO2 98%  ?Physical Exam ?Vitals and nursing note reviewed.  ?Constitutional:   ?   General: He is not in acute distress. ?   Appearance: He is well-developed.  ?HENT:  ?   Head: Normocephalic and atraumatic.  ?   Right Ear: External ear normal.  ?   Left Ear: External ear normal.  ?Eyes:  ?   General: No scleral icterus.    ?   Right eye: No discharge.     ?   Left eye: No discharge.  ?   Conjunctiva/sclera: Conjunctivae normal.  ?Neck:  ?   Trachea: No tracheal deviation.  ?Cardiovascular:  ?   Rate and Rhythm: Normal rate.  ?Pulmonary:  ?   Effort: Pulmonary effort is normal. No respiratory distress.  ?  Breath sounds: No stridor.  ?Abdominal:  ?   General: There is no distension.  ?Musculoskeletal:     ?   General: No swelling or deformity.  ?   Cervical back: Neck supple.  ?   Comments: Large hematoma in the left inguinal region extending to his proximal thigh and scrotum, it also extends up to the abdominal pannus, mild ttp  ?Skin: ?   General: Skin is warm and dry.  ?   Findings: No rash.  ?Neurological:  ?   Mental Status: He is alert.  ?   Cranial Nerves: Cranial nerve deficit: no gross deficits.  ? ? ?ED Results / Procedures / Treatments   ?Labs ?(all labs ordered are listed, but only abnormal results are displayed) ?Labs Reviewed - No data to display ? ?EKG ?None ? ?Radiology ?No results found. ? ?Procedures ?Procedures  ? ? ?Medications  Ordered in ED ?Medications - No data to display ? ?ED Course/ Medical Decision Making/ A&P ?  ?                        ?Medical Decision Making ?Pt with worsening hematoma after left femoral vascular procedure.  Symptoms concerning for expanding hematoma.  Also at risk for pseudoaneurysm. ? ?Amount and/or Complexity of Data Reviewed ?External Data Reviewed: notes. ?   Details: Notes reviewed from recent vascular procedure ?Labs: ordered. ? ? ?Patient presented to the ED for evaluation of increasing hematoma and swelling associated with recent left femoral artery catheterization.  Patient has a large hematoma that extends from his lower abdomen down into the scrotum and upper thigh.  We will check laboratory test.  We will also proceed with ultrasound to assess for pseudoaneurysm.  Cardiology consult has been placed.  Waiting to hear back from them.  Care turned over to Dr. Vanita Panda ? ? ? ? ? ? ? ?Final Clinical Impression(s) / ED Diagnoses ?pending ?  ?Dorie Rank, MD ?02/16/22 1554 ? ?

## 2022-02-16 NOTE — Consult Note (Signed)
[facility] [place] ? ?ASSESSMENT / PLAN: ?66 y.o. male with large left groin pseudoaneurysm after right lower extremity atherectomy and drug coated balloon angioplasty via left common femoral approach on 02/13/22 by Dr. Gwenlyn Found for claudication.  ? ?I reviewed treatment options and counseled him that this lesion is unlikely to close without intervention. He is understanding and is amenable. Plan open repair of left common femoral artery pseudoaneurysm with Dr. Carlis Abbott tomorrow in the OR. Keep NPO after midnight. Orders for consent signed.   ? ?CHIEF COMPLAINT: left groin pain ? ?HISTORY OF PRESENT ILLNESS: ?[name] is a 66 y.o. male who underwent right superficial femoral artery atherectomy and drug-coated angioplasty with Dr. Alvester Chou on 02/13/2022.  This was done via left common femoral artery approach with a 7 French sheath.  A closure device was not used.  The patient developed bruising postprocedure.  This was marked for the patient to monitor at home.  The patient noticed severe pain Saturday night while walking around his house.  He presented to care today.  A duplex ultrasound was performed and showed a large left groin pseudoaneurysm measuring 3 x 5 x 2 cm.   ? ?Past Medical History:  ?Diagnosis Date  ? Cervical spine ankylosis   ? CKD (chronic kidney disease) stage 3, GFR 30-59 ml/min (HCC)   ? COLONIC POLYPS, HX OF   ? CORONARY ARTERY DISEASE   ? a. s/p stent to RCA 2005. b. Stent to Cx 2007. c.  CABG X4 2014; d. LHC 04/19/18 occluded SVG-Circ, DES to in-stent restenosis of circ.   ? Diverticulosis   ? DIVERTICULOSIS, COLON   ? DYSPHAGIA UNSPECIFIED   ? intermittent  ? GERD   ? HEMORRHOIDS   ? HYPERLIPIDEMIA   ? HYPERTENSION   ? HYPERTRIGLYCERIDEMIA   ? Left carotid artery occlusion   ? OBESITY   ? Postoperative atrial fibrillation ([place]) 2014  ? Pseudoaneurysm of left femoral artery ([place]) 02/16/2022  ? SLEEP APNEA   ? on CPAP  ? Statin intolerance   ? Tobacco abuse   ? Vertebral  artery occlusion, left   ? ? ?Past Surgical History:  ?Procedure Laterality Date  ? ABDOMINAL AORTOGRAM W/LOWER EXTREMITY N/A 02/13/2022  ? Procedure: ABDOMINAL AORTOGRAM W/LOWER EXTREMITY;  Surgeon: Lorretta Harp, MD;  Location: [place] [facility];  Service: Cardiovascular;  Laterality: N/A;  ? ACHILLES TENDON REPAIR    ? BACK SURGERY    ? lower back  ? CARDIAC CATHETERIZATION    ? CORONARY ARTERY BYPASS GRAFT  08/18/2012  ? Procedure: CORONARY ARTERY BYPASS GRAFTING (CABG);  Surgeon: Gaye Pollack, MD;  Location: [place];  Service: Open Heart Surgery;  Laterality: N/A;  CABG x four;  using left internal mammary artery and right leg greater saphenous vein harvested endoscopically  ? CORONARY STENT INTERVENTION N/A 04/19/2018  ? Procedure: CORONARY STENT INTERVENTION;  Surgeon: Lorretta Harp, MD;  Location: [place] [facility];  Service: Cardiovascular;  Laterality: N/A;  ? KNEE ARTHROSCOPY    ? RIGHT  ? LEFT HEART CATH AND CORS/GRAFTS ANGIOGRAPHY N/A 04/19/2018  ? Procedure: LEFT HEART CATH AND CORS/GRAFTS ANGIOGRAPHY;  Surgeon: Lorretta Harp, MD;  Location: [place] [facility];  Service: Cardiovascular;  Laterality: N/A;  ? LUMBAR LAMINECTOMY/DECOMPRESSION MICRODISCECTOMY N/A 12/05/2015  ? Procedure: LUMBAR 2-3, LUMBAR 3-4 DECOMPRESSION ;  Surgeon: Phylliss Bob, MD;  Location: [place];  Service: Orthopedics;  Laterality: N/A;  ? PERIPHERAL VASCULAR ATHERECTOMY  02/13/2022  ? Procedure:  PERIPHERAL VASCULAR ATHERECTOMY;  Surgeon: Lorretta Harp, MD;  Location: [place] [facility];  Service: Cardiovascular;;  Rt SFA  ? Right long finger extensor tendon repair    ? ? ?Family History  ?Problem Relation Age of Onset  ? Cancer Mother   ? Heart disease Brother   ? ? ?Social History  ? ?Socioeconomic History  ? Marital status: Married  ?  Spouse name: Not on file  ? Number of children: Not on file  ? Years of education: Not on file  ? Highest education level: Not on file  ?Occupational History  ? Not on file  ?Tobacco  Use  ? Smoking status: Every Day  ?  Packs/day: 1.50  ?  Years: 43.00  ?  Pack years: 64.50  ?  Types: Cigarettes  ? Smokeless tobacco: Never  ? Tobacco comments:  ?  Patient attempting to quit pt down to 1 ppd  ?Vaping Use  ? Vaping Use: Never used  ?Substance and Sexual Activity  ? Alcohol use: Yes  ?  Comment: 1-2 drink occasionally  ? Drug use: No  ? Sexual activity: Not on file  ?Other Topics Concern  ? Not on file  ?Social History Narrative  ? Not on file  ? ?Social Determinants of Health  ? ?Financial Resource Strain: Not on file  ?Food Insecurity: Not on file  ?Transportation Needs: Not on file  ?Physical Activity: Not on file  ?Stress: Not on file  ?Social Connections: Not on file  ?Intimate Partner Violence: Not on file  ? ? ?Allergies  ?Allergen Reactions  ? Statins Other (See Comments)  ?  Bone and muscle pain  ? Oxycodone Other (See Comments)  ? Hydrocodone Nausea And Vomiting  ? ? ?Current Facility-Administered Medications  ?Medication Dose Route Frequency Provider Last Rate Last Admin  ? acetaminophen (TYLENOL) tablet 1,000 mg  1,000 mg Oral Q6H PRN Renae Fickle, MD      ? acidophilus (RISAQUAD) capsule 1 capsule  1 capsule Oral Daily Renae Fickle, MD      ? aspirin EC tablet 81 mg  81 mg Oral Daily Renae Fickle, MD   81 mg at 02/16/22 1952  ? clopidogrel (PLAVIX) tablet 75 mg  75 mg Oral Daily Renae Fickle, MD   75 mg at 02/16/22 1952  ? losartan (COZAAR) tablet 100 mg  100 mg Oral Daily Renae Fickle, MD   100 mg at 02/16/22 1952  ? And  ? hydrochlorothiazide (HYDRODIURIL) tablet 12.5 mg  12.5 mg Oral Daily Renae Fickle, MD   12.5 mg at 02/16/22 1952  ? morphine (PF) 2 MG/ML injection 1 mg  1 mg Intravenous Q6H PRN Renae Fickle, MD   1 mg at 02/16/22 1952  ? pantoprazole (PROTONIX) EC tablet 40 mg  40 mg Oral Daily Renae Fickle, MD   40 mg at 02/16/22 1952  ? polyvinyl alcohol (LIQUIFILM TEARS) 1.4 % ophthalmic solution 1 drop  1 drop Both Eyes Daily PRN Renae Fickle, MD      ? rosuvastatin (CRESTOR) tablet 5 mg  5 mg Oral Daily Renae Fickle, MD   5 mg at 02/16/22 1952  ? senna-docusate (Senokot-S) tablet 1 tablet  1 tablet Oral QHS PRN Renae Fickle, MD      ? sodium chloride flush (NS) 0.9 % injection 3 mL  3 mL Intravenous Q12H Renae Fickle, MD      ? ? ?PHYSICAL EXAM ?Vitals:  ? 02/16/22 1419 02/16/22 1745 02/16/22 1906 02/16/22 1931  ?  BP: (!) 161/77 (!) 169/79 (!) 164/85 (!) 171/82  ?Pulse: 80 74 67   ?Resp: 18  18   ?Temp: 98.4 ?F (36.9 ?C)  98 ?F (36.7 ?C) 98.5 ?F (36.9 ?C)  ?TempSrc: Oral  Oral Oral  ?SpO2: 98% 100% 96%   ? ? ?Constitutional: Well-appearing gentleman in no acute distress.  ?Cardiac: regular rate and rhythm.  ?Respiratory:  unlabored. ?Abdominal:  soft, non-tender, non-distended.  ?Peripheral vascular: Ecchymosis throughout the left groin, and proximal thigh extending into the scrotum.  A palpable pulsatile tender mass is present in the left groin. ? ?PERTINENT LABORATORY AND RADIOLOGIC DATA ? ?Most recent CBC ? ?  Latest Ref Rng & Units 02/16/2022  ?  2:56 PM 02/14/2022  ?  2:25 AM 01/22/2022  ?  3:11 PM  ?CBC  ?WBC 4.0 - 10.5 K/uL 8.2   7.8   7.1    ?Hemoglobin 13.0 - 17.0 g/dL 11.6   12.2   13.9    ?Hematocrit 39.0 - 52.0 % 33.7   35.5   40.5    ?Platelets 150 - 400 K/uL 140   144   188    ?  ? ?Most recent CMP ? ?  Latest Ref Rng & Units 02/16/2022  ?  2:56 PM 02/14/2022  ?  2:25 AM 01/22/2022  ?  3:11 PM  ?CMP  ?Glucose 70 - 99 mg/dL 98   108   91    ?BUN 8 - 23 mg/dL '22   20   18    '$ ?Creatinine 0.61 - 1.24 mg/dL 1.68   1.64   1.75    ?Sodium 135 - 145 mmol/L 137   134   137    ?Potassium 3.5 - 5.1 mmol/L 4.2   4.1   4.1    ?Chloride 98 - 111 mmol/L 105   108   101    ?CO2 22 - 32 mmol/L '23   22   23    '$ ?Calcium 8.9 - 10.3 mg/dL 8.8   8.3   9.4    ? ? ?Renal function ?Estimated Creatinine Clearance: 48.6 mL/min (A) (by C-G formula based on SCr of 1.68 mg/dL (H)). ? ?Hgb A1c MFr Bld (%)  ?Date Value  ?11/18/2018 5.5  ? ? ?LDL Chol Calc  (NIH)  ?Date Value Ref Range Status  ?08/03/2020 73 0 - 99 mg/dL Final  ? ?LDL Cholesterol  ?Date Value Ref Range Status  ?02/14/2022 77 0 - 99 mg/dL Final  ?  Comment:  ?         ?Total Cholesterol/HDL:CHD Risk

## 2022-02-16 NOTE — Progress Notes (Addendum)
VASCULAR LAB ? ? ? ?Left groin ultrasound has been performed. ? ?See CV proc for preliminary results. ? ?Called  Dr. Vanita Panda with results. ? ?Mende Biswell, RVT ?02/16/2022, 4:11 PM ? ?

## 2022-02-16 NOTE — ED Notes (Signed)
Significant swelling and brusing noted to Left groin and genitals. Pt c/o a lot of pain. States he had some swelling but he can't handle the pain. Tylenol eases the pain but it doesn't go completely away ?

## 2022-02-17 ENCOUNTER — Encounter (HOSPITAL_COMMUNITY): Payer: Self-pay | Admitting: Cardiology

## 2022-02-17 ENCOUNTER — Encounter (HOSPITAL_COMMUNITY)
Admission: EM | Disposition: A | Discharge status: Home / Self Care | Payer: Self-pay | Source: Home / Self Care | Attending: Cardiovascular Disease

## 2022-02-17 ENCOUNTER — Inpatient Hospital Stay (HOSPITAL_COMMUNITY): Payer: BC Managed Care – PPO | Admitting: Anesthesiology

## 2022-02-17 ENCOUNTER — Other Ambulatory Visit: Payer: Self-pay

## 2022-02-17 ENCOUNTER — Inpatient Hospital Stay (HOSPITAL_COMMUNITY): Payer: BC Managed Care – PPO

## 2022-02-17 DIAGNOSIS — I724 Aneurysm of artery of lower extremity: Secondary | ICD-10-CM

## 2022-02-17 HISTORY — PX: ENDARTERECTOMY FEMORAL: SHX5804

## 2022-02-17 HISTORY — PX: THROMBECTOMY FEMORAL ARTERY: SHX6406

## 2022-02-17 HISTORY — PX: PATCH ANGIOPLASTY: SHX6230

## 2022-02-17 HISTORY — PX: INTRAOPERATIVE ARTERIOGRAM: SHX5157

## 2022-02-17 LAB — BASIC METABOLIC PANEL
Anion gap: 6 (ref 5–15)
BUN: 19 mg/dL (ref 8–23)
CO2: 22 mmol/L (ref 22–32)
Calcium: 8.4 mg/dL — ABNORMAL LOW (ref 8.9–10.3)
Chloride: 107 mmol/L (ref 98–111)
Creatinine, Ser: 1.55 mg/dL — ABNORMAL HIGH (ref 0.61–1.24)
GFR, Estimated: 49 mL/min — ABNORMAL LOW (ref >60)
Glucose, Bld: 103 mg/dL — ABNORMAL HIGH (ref 70–99)
Potassium: 4.4 mmol/L (ref 3.5–5.1)
Sodium: 135 mmol/L (ref 135–145)

## 2022-02-17 LAB — CBC
HCT: 32.7 % — ABNORMAL LOW (ref 39.0–52.0)
Hemoglobin: 11 g/dL — ABNORMAL LOW (ref 13.0–17.0)
MCH: 30.5 pg (ref 26.0–34.0)
MCHC: 33.6 g/dL (ref 30.0–36.0)
MCV: 90.6 fL (ref 80.0–100.0)
Platelets: 141 10*3/uL — ABNORMAL LOW (ref 150–400)
RBC: 3.61 MIL/uL — ABNORMAL LOW (ref 4.22–5.81)
RDW: 12.1 % (ref 11.5–15.5)
WBC: 7.2 10*3/uL (ref 4.0–10.5)
nRBC: 0 % (ref 0.0–0.2)

## 2022-02-17 LAB — SURGICAL PCR SCREEN
MRSA, PCR: NEGATIVE
Staphylococcus aureus: NEGATIVE

## 2022-02-17 LAB — PROTIME-INR
INR: 1.1 (ref 0.8–1.2)
Prothrombin Time: 14 seconds (ref 11.4–15.2)

## 2022-02-17 LAB — POCT ACTIVATED CLOTTING TIME
Activated Clotting Time: 281 seconds
Activated Clotting Time: 251 seconds

## 2022-02-17 SURGERY — THROMBECTOMY, ARTERY, FEMORAL
Anesthesia: General | Site: Leg Upper | Laterality: Left

## 2022-02-17 MED ORDER — PHENYLEPHRINE HCL-NACL 20-0.9 MG/250ML-% IV SOLN
INTRAVENOUS | Status: DC | PRN
  Administered 2022-02-17: 50 ug/min via INTRAVENOUS
  Administered 2022-02-17: 25 ug/min via INTRAVENOUS

## 2022-02-17 MED ORDER — PROTAMINE SULFATE 10 MG/ML IV SOLN
INTRAVENOUS | Status: AC
  Filled 2022-02-17: qty 5

## 2022-02-17 MED ORDER — ROCURONIUM BROMIDE 10 MG/ML (PF) SYRINGE
PREFILLED_SYRINGE | INTRAVENOUS | Status: AC
  Filled 2022-02-17: qty 10

## 2022-02-17 MED ORDER — EPHEDRINE 5 MG/ML INJ
INTRAVENOUS | Status: AC
  Filled 2022-02-17: qty 5

## 2022-02-17 MED ORDER — IODIXANOL 320 MG/ML IV SOLN
INTRAVENOUS | Status: DC | PRN
  Administered 2022-02-17: 70 mL

## 2022-02-17 MED ORDER — ONDANSETRON HCL 4 MG/2ML IJ SOLN
INTRAMUSCULAR | Status: DC | PRN
  Administered 2022-02-17: 4 mg via INTRAVENOUS

## 2022-02-17 MED ORDER — PHENYLEPHRINE 80 MCG/ML (10ML) SYRINGE FOR IV PUSH (FOR BLOOD PRESSURE SUPPORT)
PREFILLED_SYRINGE | INTRAVENOUS | Status: AC
  Filled 2022-02-17: qty 10

## 2022-02-17 MED ORDER — SUGAMMADEX SODIUM 200 MG/2ML IV SOLN
INTRAVENOUS | Status: DC | PRN
  Administered 2022-02-17: 200 mg via INTRAVENOUS

## 2022-02-17 MED ORDER — LIDOCAINE 2% (20 MG/ML) 5 ML SYRINGE
INTRAMUSCULAR | Status: DC | PRN
  Administered 2022-02-17: 80 mg via INTRAVENOUS

## 2022-02-17 MED ORDER — PROTAMINE SULFATE 10 MG/ML IV SOLN
INTRAVENOUS | Status: DC | PRN
  Administered 2022-02-17: 50 mg via INTRAVENOUS

## 2022-02-17 MED ORDER — PROPOFOL 10 MG/ML IV BOLUS
INTRAVENOUS | Status: DC | PRN
  Administered 2022-02-17: 180 mg via INTRAVENOUS

## 2022-02-17 MED ORDER — DEXAMETHASONE SODIUM PHOSPHATE 10 MG/ML IJ SOLN
INTRAMUSCULAR | Status: DC | PRN
  Administered 2022-02-17: 5 mg via INTRAVENOUS

## 2022-02-17 MED ORDER — PROPOFOL 10 MG/ML IV BOLUS
INTRAVENOUS | Status: AC
  Filled 2022-02-17: qty 20

## 2022-02-17 MED ORDER — SODIUM CHLORIDE 0.9 % IV SOLN: INTRAVENOUS | Status: DC | PRN

## 2022-02-17 MED ORDER — CHLORHEXIDINE GLUCONATE 0.12 % MT SOLN
OROMUCOSAL | Status: AC
  Administered 2022-02-17: 15 mL via OROMUCOSAL
  Filled 2022-02-17: qty 15

## 2022-02-17 MED ORDER — PHENYLEPHRINE HCL-NACL 20-0.9 MG/250ML-% IV SOLN: INTRAVENOUS | Status: DC | PRN

## 2022-02-17 MED ORDER — CHLORHEXIDINE GLUCONATE 0.12 % MT SOLN: 15.0000 mL | Freq: Once | OROMUCOSAL | Status: AC

## 2022-02-17 MED ORDER — FENTANYL CITRATE (PF) 100 MCG/2ML IJ SOLN: 25.0000 ug | INTRAMUSCULAR | Status: DC | PRN

## 2022-02-17 MED ORDER — 0.9 % SODIUM CHLORIDE (POUR BTL) OPTIME
TOPICAL | Status: DC | PRN
  Administered 2022-02-17: 2000 mL

## 2022-02-17 MED ORDER — CEFAZOLIN SODIUM 1 G IJ SOLR
INTRAMUSCULAR | Status: AC
  Filled 2022-02-17: qty 20

## 2022-02-17 MED ORDER — FENTANYL CITRATE (PF) 100 MCG/2ML IJ SOLN
INTRAMUSCULAR | Status: DC | PRN
  Administered 2022-02-17: 100 ug via INTRAVENOUS
  Administered 2022-02-17 (×3): 50 ug via INTRAVENOUS

## 2022-02-17 MED ORDER — HEPARIN 6000 UNIT IRRIGATION SOLUTION
Status: DC | PRN
  Administered 2022-02-17: 1

## 2022-02-17 MED ORDER — EPHEDRINE SULFATE-NACL 50-0.9 MG/10ML-% IV SOSY
PREFILLED_SYRINGE | INTRAVENOUS | Status: DC | PRN
Start: 2022-02-17 — End: 2022-02-17
  Administered 2022-02-17 (×2): 5 mg via INTRAVENOUS
  Administered 2022-02-17: 15 mg via INTRAVENOUS

## 2022-02-17 MED ORDER — CEFAZOLIN SODIUM-DEXTROSE 2-3 GM-%(50ML) IV SOLR
INTRAVENOUS | Status: DC | PRN
  Administered 2022-02-17: 2 g via INTRAVENOUS

## 2022-02-17 MED ORDER — MIDAZOLAM HCL 2 MG/2ML IJ SOLN
INTRAMUSCULAR | Status: AC
  Filled 2022-02-17: qty 2

## 2022-02-17 MED ORDER — ACETAMINOPHEN 10 MG/ML IV SOLN: 1000.0000 mg | Freq: Once | INTRAVENOUS | Status: DC | PRN

## 2022-02-17 MED ORDER — HEMOSTATIC AGENTS (NO CHARGE) OPTIME
TOPICAL | Status: DC | PRN
  Administered 2022-02-17: 3 via TOPICAL

## 2022-02-17 MED ORDER — MIDAZOLAM HCL 2 MG/2ML IJ SOLN
INTRAMUSCULAR | Status: DC | PRN
  Administered 2022-02-17: 2 mg via INTRAVENOUS

## 2022-02-17 MED ORDER — PHENYLEPHRINE 80 MCG/ML (10ML) SYRINGE FOR IV PUSH (FOR BLOOD PRESSURE SUPPORT)
PREFILLED_SYRINGE | INTRAVENOUS | Status: DC | PRN
  Administered 2022-02-17: 160 ug via INTRAVENOUS
  Administered 2022-02-17: 80 ug via INTRAVENOUS
  Administered 2022-02-17: 160 ug via INTRAVENOUS

## 2022-02-17 MED ORDER — STERILE WATER FOR IRRIGATION IR SOLN
Status: DC | PRN
  Administered 2022-02-17: 1000 mL

## 2022-02-17 MED ORDER — HEPARIN SODIUM (PORCINE) 1000 UNIT/ML IJ SOLN
INTRAMUSCULAR | Status: DC | PRN
  Administered 2022-02-17: 2000 [IU] via INTRAVENOUS
  Administered 2022-02-17: 9000 [IU] via INTRAVENOUS

## 2022-02-17 MED ORDER — ALBUMIN HUMAN 5 % IV SOLN
INTRAVENOUS | Status: DC | PRN
Start: 2022-02-17 — End: 2022-02-17

## 2022-02-17 MED ORDER — DEXAMETHASONE SODIUM PHOSPHATE 10 MG/ML IJ SOLN
INTRAMUSCULAR | Status: AC
  Filled 2022-02-17: qty 1

## 2022-02-17 MED ORDER — ONDANSETRON HCL 4 MG/2ML IJ SOLN
INTRAMUSCULAR | Status: AC
  Filled 2022-02-17: qty 2

## 2022-02-17 MED ORDER — LACTATED RINGERS IV SOLN: INTRAVENOUS | Status: DC

## 2022-02-17 MED ORDER — FENTANYL CITRATE (PF) 250 MCG/5ML IJ SOLN
INTRAMUSCULAR | Status: AC
  Filled 2022-02-17: qty 5

## 2022-02-17 MED ORDER — LIDOCAINE 2% (20 MG/ML) 5 ML SYRINGE
INTRAMUSCULAR | Status: AC
  Filled 2022-02-17: qty 5

## 2022-02-17 MED ORDER — ROCURONIUM BROMIDE 10 MG/ML (PF) SYRINGE
PREFILLED_SYRINGE | INTRAVENOUS | Status: DC | PRN
  Administered 2022-02-17: 30 mg via INTRAVENOUS
  Administered 2022-02-17: 40 mg via INTRAVENOUS
  Administered 2022-02-17: 60 mg via INTRAVENOUS

## 2022-02-17 MED ORDER — ORAL CARE MOUTH RINSE: 15.0000 mL | Freq: Once | OROMUCOSAL | Status: AC

## 2022-02-17 SURGICAL SUPPLY — 59 items
ADH SKN CLS LQ APL DERMABOND (GAUZE/BANDAGES/DRESSINGS) ×3
BAG COUNTER SPONGE SURGICOUNT (BAG) ×4 IMPLANT
BAG SPNG CNTER NS LX DISP (BAG) ×3
BANDAGE ESMARK 6X9 LF (GAUZE/BANDAGES/DRESSINGS) IMPLANT
BNDG CMPR 9X6 STRL LF SNTH (GAUZE/BANDAGES/DRESSINGS)
BNDG ESMARK 6X9 LF (GAUZE/BANDAGES/DRESSINGS)
CANISTER SUCT 3000ML PPV (MISCELLANEOUS) ×4 IMPLANT
CATH EMB 3FR 80CM (CATHETERS) ×3 IMPLANT
CATH EMB 4FR 80CM (CATHETERS) ×1 IMPLANT
CLIP VESOCCLUDE MED 24/CT (CLIP) ×4 IMPLANT
CLIP VESOCCLUDE SM WIDE 24/CT (CLIP) ×4 IMPLANT
COVER PROBE W GEL 5X96 (DRAPES) ×6 IMPLANT
DERMABOND ADHESIVE PROPEN (GAUZE/BANDAGES/DRESSINGS) ×1
DERMABOND ADVANCED .7 DNX6 (GAUZE/BANDAGES/DRESSINGS) IMPLANT
DRAIN CHANNEL 15F RND FF W/TCR (WOUND CARE) IMPLANT
DRAPE C-ARM 42X72 X-RAY (DRAPES) ×4 IMPLANT
DRAPE ORTHO SPLIT 77X108 STRL (DRAPES) ×4
DRAPE SURG ORHT 6 SPLT 77X108 (DRAPES) IMPLANT
ELECT REM PT RETURN 9FT ADLT (ELECTROSURGICAL) ×4
ELECTRODE REM PT RTRN 9FT ADLT (ELECTROSURGICAL) ×3 IMPLANT
EVACUATOR SILICONE 100CC (DRAIN) IMPLANT
GAUZE 4X4 16PLY ~~LOC~~+RFID DBL (SPONGE) ×1 IMPLANT
GLOVE BIO SURGEON STRL SZ7.5 (GLOVE) ×4 IMPLANT
GLOVE BIOGEL PI IND STRL 8 (GLOVE) ×3 IMPLANT
GLOVE BIOGEL PI INDICATOR 8 (GLOVE) ×1
GOWN STRL REUS W/ TWL LRG LVL3 (GOWN DISPOSABLE) ×6 IMPLANT
GOWN STRL REUS W/ TWL XL LVL3 (GOWN DISPOSABLE) ×6 IMPLANT
GOWN STRL REUS W/TWL LRG LVL3 (GOWN DISPOSABLE) ×8
GOWN STRL REUS W/TWL XL LVL3 (GOWN DISPOSABLE) ×8
HEMOSTAT SNOW SURGICEL 2X4 (HEMOSTASIS) ×3 IMPLANT
INSERT FOGARTY SM (MISCELLANEOUS) IMPLANT
KIT BASIN OR (CUSTOM PROCEDURE TRAY) ×4 IMPLANT
KIT MICROPUNCTURE NIT STIFF (SHEATH) ×1 IMPLANT
KIT TURNOVER KIT B (KITS) ×4 IMPLANT
LOOP VESSEL MINI RED (MISCELLANEOUS) ×2 IMPLANT
MARKER SKIN DUAL TIP RULER LAB (MISCELLANEOUS) ×1 IMPLANT
NS IRRIG 1000ML POUR BTL (IV SOLUTION) ×8 IMPLANT
PACK PERIPHERAL VASCULAR (CUSTOM PROCEDURE TRAY) ×4 IMPLANT
PAD ARMBOARD 7.5X6 YLW CONV (MISCELLANEOUS) ×8 IMPLANT
PATCH VASC XENOSURE 1CMX6CM (Vascular Products) ×4 IMPLANT
PATCH VASC XENOSURE 1X6 (Vascular Products) IMPLANT
SET COLLECT BLD 21X3/4 12 (NEEDLE) ×1 IMPLANT
STAPLER VISISTAT 35W (STAPLE) IMPLANT
STOPCOCK 4 WAY LG BORE MALE ST (IV SETS) ×2 IMPLANT
STOPCOCK MORSE 400PSI 3WAY (MISCELLANEOUS) ×1 IMPLANT
SUT MNCRL AB 4-0 PS2 18 (SUTURE) ×12 IMPLANT
SUT PROLENE 5 0 C 1 24 (SUTURE) ×15 IMPLANT
SUT PROLENE 6 0 BV (SUTURE) ×8 IMPLANT
SUT VIC AB 2-0 CT1 27 (SUTURE) ×8
SUT VIC AB 2-0 CT1 TAPERPNT 27 (SUTURE) ×6 IMPLANT
SUT VIC AB 3-0 SH 27 (SUTURE) ×12
SUT VIC AB 3-0 SH 27X BRD (SUTURE) ×9 IMPLANT
SYR 20ML LL LF (SYRINGE) ×2 IMPLANT
SYR 3ML LL SCALE MARK (SYRINGE) ×1 IMPLANT
TOWEL GREEN STERILE (TOWEL DISPOSABLE) ×4 IMPLANT
TRAY FOLEY MTR SLVR 16FR STAT (SET/KITS/TRAYS/PACK) ×4 IMPLANT
TUBING EXTENTION W/L.L. (IV SETS) ×1 IMPLANT
UNDERPAD 30X36 HEAVY ABSORB (UNDERPADS AND DIAPERS) ×4 IMPLANT
WATER STERILE IRR 1000ML POUR (IV SOLUTION) ×4 IMPLANT

## 2022-02-17 NOTE — Progress Notes (Signed)
[facility] [place] ? ?Subjective  - lots of discomfort left groin. ? ? ?Objective ?(!) 159/85 ?64 ?97.9 ?F (36.6 ?C) (Oral) ?17 ?97% ? ?Intake/Output Summary (Last 24 hours) at 02/17/2022 0846 ?Last data filed at 02/17/2022 0400 ?Gross per 24 hour  ?Intake 740 ml  ?Output --  ?Net 740 ml  ? ? ?Left groin bruising with palpable femoral pseudoaneurysm ?Left DP palpable ? ?Laboratory ?Lab Results: ?Recent Labs  ?  02/16/22 ?1456 02/17/22 ?0320  ?WBC 8.2 7.2  ?HGB 11.6* 11.0*  ?HCT 33.7* 32.7*  ?PLT 140* 141*  ? ?BMET ?Recent Labs  ?  02/16/22 ?1456 02/17/22 ?0320  ?NA 137 135  ?K 4.2 4.4  ?CL 105 107  ?CO2 23 22  ?GLUCOSE 98 103*  ?BUN 22 19  ?CREATININE 1.68* 1.55*  ?CALCIUM 8.8* 8.4*  ? ? ?COAG ?Lab Results  ?Component Value Date  ? INR 1.1 02/17/2022  ? INR 1.08 03/10/2017  ? INR 1.09 12/03/2015  ? ?No results found for: PTT ? ?Assessment/Planning: ? ?Plan left femoral pseudoaneurysm repair after transfemoral access last week for right leg intervention with cardiology.  Risks benefits discussed. ? ?Marty Heck ?02/17/2022 ?8:46 AM ?-- ? ? ?

## 2022-02-17 NOTE — Anesthesia Preprocedure Evaluation (Addendum)
Anesthesia Evaluation  ?Patient identified by MRN, date of birth, ID band ?Patient awake ? ? ? ?Reviewed: ?Allergy & Precautions, NPO status , Patient's Chart, lab work & pertinent test results ? ?Airway ?Mallampati: II ? ?TM Distance: >3 FB ?Neck ROM: Full ? ? ? Dental ?no notable dental hx. ? ?  ?Pulmonary ?sleep apnea and Continuous Positive Airway Pressure Ventilation , Current Smoker,  ?  ?Pulmonary exam normal ? ? ? ? ? ? ? Cardiovascular ?hypertension, + CAD, + Cardiac Stents (2019), + CABG (2014) and + Peripheral Vascular Disease  ? ?Rhythm:Regular Rate:Normal ? ?pseudoaneurysm ?  ?Neuro/Psych ?negative neurological ROS ? negative psych ROS  ? GI/Hepatic ?Neg liver ROS, GERD  Medicated,  ?Endo/Other  ?negative endocrine ROS ? Renal/GU ?  ?negative genitourinary ?  ?Musculoskeletal ?negative musculoskeletal ROS ?(+)  ? Abdominal ?Normal abdominal exam  (+)   ?Peds ? Hematology ?negative hematology ROS ?(+)   ?Anesthesia Other Findings ? ? Reproductive/Obstetrics ? ?  ? ? ? ? ? ? ? ? ? ? ? ? ? ?  ?  ? ? ? ? ? ? ? ?Anesthesia Physical ?Anesthesia Plan ? ?ASA: 3 ? ?Anesthesia Plan: General  ? ?Post-op Pain Management:   ? ?Induction: Intravenous ? ?PONV Risk Score and Plan: 1 and Ondansetron, Dexamethasone and Treatment may vary due to age or medical condition ? ?Airway Management Planned: Mask and Oral ETT ? ?Additional Equipment: Arterial line ? ?Intra-op Plan:  ? ?Post-operative Plan: Extubation in OR ? ?Informed Consent: I have reviewed the patients History and Physical, chart, labs and discussed the procedure including the risks, benefits and alternatives for the proposed anesthesia with the patient or authorized representative who has indicated his/her understanding and acceptance.  ? ? ? ?Dental advisory given ? ?Plan Discussed with: CRNA ? ?Anesthesia Plan Comments: (Lab Results ?     Component                Value               Date                 ?     WBC                       7.2                 02/17/2022           ?     HGB                      11.0 (L)            02/17/2022           ?     HCT                      32.7 (L)            02/17/2022           ?     MCV                      90.6                02/17/2022           ?     PLT  141 (L)             02/17/2022           ?Lab Results ?     Component                Value               Date                 ?     NA                       135                 02/17/2022           ?     K                        4.4                 02/17/2022           ?     CO2                      22                  02/17/2022           ?     GLUCOSE                  103 (H)             02/17/2022           ?     BUN                      19                  02/17/2022           ?     CREATININE               1.55 (H)            02/17/2022           ?     CALCIUM                  8.4 (L)             02/17/2022           ?     EGFR                     42 (L)              01/22/2022           ?     GFRNONAA                 49 (L)              02/17/2022          )  ? ? ? ? ?Anesthesia Quick Evaluation ? ?

## 2022-02-17 NOTE — Op Note (Signed)
Date: February 17, 2022 ? ?Preoperative diagnosis: Left common femoral pseudoaneurysm ? ?Postoperative diagnosis: Same ? ?Procedure: ?1.  Repair of left common femoral artery pseudoaneurysm ?2.  Left lower extremity arteriogram ?3.  Left lower extremity thrombectomy (#3 and #4 Fogarty) ?4.  Left common femoral endarterectomy with bovine pericardial patch angioplasty ? ?Surgeon: Dr. Marty Heck, MD ? ?Assistant: Dr. Melene Muller, MD ? ?Indications: Patient is a 66 year old male who underwent left transfemoral access last Thursday for right lower extremity intervention for claudication with cardiology.  He was seen in the ED yesterday by my partner with evidence of a 4 cm left common femoral pseudoaneurysm with significant pain and a fairly wide neck.  He presents today for left common femoral pseudoaneurysm repair after risks benefits discussed.  An assistant was needed for exposure and to expedite the case and for the complexity including performing the endarterectomy. ? ?Findings: Transverse incision was made in the left groin over the pseudoaneurysm and ultimately got into the pseudoaneurysm cavity and used digital pressure over the arteriotomy while proximal and distal control was obtained.  Initially repaired this with a 5-0 Prolene figure-of-eight stitch in the common femoral artery.  We really had a very poor pulse distal to the repair and no signals in the left foot.  Left lower extremity arteriogram was then performed that showed a high-grade greater than 80% common femoral stenosis evident on arteriogram last week.  Distally there was very diseased SFA with multiple segments of moderate to high grade stenosis with the vessel occluding at Hunter's canal (this was patent on last week's arteriogram imaging although very diseased).  Ultimately the common femoral artery was dissected further out and Vesseloops were placed on all its branches.  I then performed a longitudinal arteriotomy.  There was a large  ulcerated plaque in the common femoral artery causing greater than 80% stenosis with mixed mural thrombus.  I passed a #3 and #4 Fogarty down the SFA and got to about 50 cm before meeting resistance and pulled out several small pieces of plaque and chronic thrombus that likely embolized from the common femoral artery.  Endarterectomy was then performed of the common femoral artery and a bovine pericardial patch was sewn to the left common femoral artery.  The artery was ectatic and very diseased and did have to put several pledgets for repair after the patch was sewn.  Brisk DP signal in the left foot at completion. ? ?Anesthesia: General ? ?EBL: 700 mL ? ?Details: Patient was taken to the operating room after informed consent was obtained.  Placed on the operative table in supine position.  General endotracheal anesthesia was induced.  The left groin was then prepped and draped in standard sterile fashion.  Antibiotics were given.  Timeout performed.  Initially made a transverse incision over the left common femoral pseudoaneurysm where there was a prominent pulse.  Dissected down through the subcutaneous tissue and used cerebellar retractors for added visualization.  Ultimately we entered the pseudoaneurysm cavity with brisk arterial bleeding and this was controlled with digital pressure.  We then dissected proximal and distal to this to get Vesseloops around the artery for control.  Ultimately I then put a figure-of-eight 5-0 Prolene repair suture in the arteriotomy with good hemostasis.  It was evident that I had a very good pulse proximally but a very weak pulse distal to this repair.  We checked the left foot and could not find any signals in the foot.  I elected to shoot a  left lower extremity arteriogram and placed a micro access needle within a microwire and micro sheath in the left common femoral artery.  We then shot a left lower extremity arteriogram with a fluoroscopic C arm that showed a greater than  80% stenosis in the common femoral artery that was evident on arteriogram imaging from last week with cardiology.  The remaining runoff showed a very diseased SFA with multiple segments of moderate to high-grade stenosis and then this occluded at Hunter's canal.  This was patent on imaging last week but severely diseased.  Dominant runoff was through the peroneal and posterior tibial artery.  Ultimately I removed the micro access sheath and tied down the arteriotomy with a 6-0 Prolene pursestring.  I then dissected up under the inguinal ligament to get control of the distal external iliac and then dissected out the SFA and profunda and got Vesseloops here.  Patient was given 100 units/kg IV heparin and the SFA and profunda were controlled with Vesseloops and the distal external iliac was controlled with a Henley clamp.  I then opened the common femoral artery 11 blade scalpel extended with Potts scissors.  There was a large ulcerated plaque on the posterior wall with mixed mural thrombus.  I then attempted to pass a #4 Fogarty down the SFA and it would only go about 30 cm before I met resistance.  I did not want to force this and I then used a #3 Fogarty that I was able to get 50 cm before meeting resistance.  I made several passes and pulled out chronic appearing thrombus that I suspect embolized from the common femoral ulcerated plaque.  Ultimately had good backbleeding from the SFA.  I then used a Garment/textile technologist and performed endarterectomy of the left common femoral artery and removed all the ulcerated plaque and the plaque was feathered at the endpoint just before the takeoff of the SFA and profunda.  The distal endpoint was tacked down with multiple 6-0 Prolene's.  A bovine pericardial patch was brought on the field and sewn in place with 5-0 Prolene parachute technique.  Once I came off clamps I had to place multiple repair sutures including proximally at the patch where the artery was very thin and had to  put multiple pledgeted repairs here with 5-0 Prolene.  Once we found good hemostasis the groin was irrigated out and we checked the left foot and had an excellent signal.  The patient was given 50 mg protamine for reversal.  Surgicel snow was used for hemostasis.  The groin was irrigated out and closed in multiple layers of 2-0 Vicryl, 3-0 Vicryl, 4-0 Monocryl and Dermabond.  Taken to recovery in stable condition. ? ?Complication: None ? ?Condition: Stable ? ?Marty Heck, MD ?[facility] ?Office: [phone] ? ? ?Marty Heck ? ?

## 2022-02-17 NOTE — Transfer of Care (Signed)
Immediate Anesthesia Transfer of Care Note ? ?Patient: [name] ? ?Procedure(s) Performed: Repair of Left COMMON FEMORAL PSEUDOANEURYSM. (Left: Groin) ?INTRA OPERATIVE ARTERIOGRAM (Left: Leg Upper) ?ENDARTERECTOMY FEMORAL (Left: Leg Lower) ?PATCH ANGIOPLASTY Left Common Femoral Artery. (Left: Groin) ? ?Patient Location: PACU ? ?Anesthesia Type:General ? ?Level of Consciousness: awake and oriented ? ?Airway & Oxygen Therapy: Patient Spontanous Breathing ? ?Post-op Assessment: Report given to RN ? ?Post vital signs: Reviewed and stable ? ?Last Vitals:  ?Vitals Value Taken Time  ?BP 122/56   ?Temp    ?Pulse /87 02/17/22 1242  ?Resp 21 02/17/22 1243  ?SpO2 96 % 02/17/22 1242  ?Vitals shown include unvalidated device data. ? ?Last Pain:  ?Vitals:  ? 02/17/22 0832  ?TempSrc: Oral  ?PainSc:   ?   ? ?Patients Stated Pain Goal: 3 (02/16/22 1931) ? ?Complications: No notable events documented. ?

## 2022-02-17 NOTE — Progress Notes (Signed)
Patient back from procedure.

## 2022-02-17 NOTE — Anesthesia Procedure Notes (Signed)
Arterial Line Insertion ?Start/End4/24/2023 9:34 AM, 02/17/2022 9:37 AM ?Performed by: Darral Dash, DO, anesthesiologist ? Patient location: OR. ?Preanesthetic checklist: patient identified, IV checked, site marked, risks and benefits discussed, surgical consent, monitors and equipment checked, pre-op evaluation, timeout performed and anesthesia consent ?Left, radial was placed ?Catheter size: 20 G ?Hand hygiene performed  and maximum sterile barriers used  ? ?Attempts: 2 ?Procedure performed using ultrasound guided technique. ?Ultrasound Notes:anatomy identified, needle tip was noted to be adjacent to the nerve/plexus identified and no ultrasound evidence of intravascular and/or intraneural injection ?Following insertion, dressing applied and Biopatch. ?Post procedure assessment: normal and unchanged ? ?Post procedure complications: local hematoma and second provider assisted. ? ? ?

## 2022-02-17 NOTE — TOC Initial Note (Signed)
Transition of Care (TOC) - Initial/Assessment Note  ? ? ?Patient Details  ?Name: [name] ?MRN: [id number] ?Date of Birth: [birth year] ? ?Transition of Care [facility]) CM/SW Contact:    ?Ninfa Meeker, RN ?Phone Number: ?02/17/2022, 2:53 PM ? ?Clinical Narrative:    ?Transition of Care Screening Note:              ? ?Transition of Care Department [facility]) has reviewed patient and no TOC needs have been identified at this time. We will continue to monitor patient advancement through Interdisciplinary progressions. If new patient transition needs arise, please place a consult.   ? ?  ?  ? ? ?Patient Goals and CMS Choice ?  ?  ?  ? ?Expected Discharge Plan and Services ?  ?  ?  ?  ?  ?                ?  ?  ?  ?  ?  ?  ?  ?  ?  ?  ? ?Prior Living Arrangements/Services ?  ?  ?  ?       ?  ?  ?  ?  ? ?Activities of Daily Living ?Home Assistive Devices/Equipment: None ?ADL Screening (condition at time of admission) ?Patient's cognitive ability adequate to safely complete daily activities?: Yes ?Is the patient deaf or have difficulty hearing?: Yes ?Does the patient have difficulty seeing, even when wearing glasses/contacts?: No ?Does the patient have difficulty concentrating, remembering, or making decisions?: No ?Patient able to express need for assistance with ADLs?: No ?Does the patient have difficulty dressing or bathing?: No ?Independently performs ADLs?: Yes (appropriate for developmental age) ?Does the patient have difficulty walking or climbing stairs?: No ?Weakness of Legs: Left ?Weakness of Arms/Hands: None ? ?Permission Sought/Granted ?  ?  ?   ?   ?   ?   ? ?Emotional Assessment ?  ?  ?  ?  ?  ?  ? ?Admission diagnosis:  Femoral artery pseudoaneurysm complicating cardiac catheterization (HCC) [T81.718A, I72.4] ?Peripheral arterial disease ([place]) [I73.9] ?Patient Active Problem List  ? Diagnosis Date Noted  ? Pseudoaneurysm of left femoral artery ([place]) 02/16/2022  ? Femoral artery pseudoaneurysm complicating  cardiac catheterization ([place]) 02/16/2022  ? Claudication in peripheral vascular disease ([place]) 02/13/2022  ? Peripheral arterial disease ([place]) 01/22/2022  ? Cervical spine ankylosis   ? Left carotid artery occlusion   ? Vertebral artery occlusion, left   ? Mixed hyperlipidemia 05/07/2018  ? CAD (coronary artery disease) 04/19/2018  ? Unstable angina ([place]) 04/16/2018  ? CKD (chronic kidney disease) stage 3, GFR 30-59 ml/min (HCC) 04/16/2018  ? 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 PAF (paroxysmal atrial fibrillation) ([place]) 09/10/2012  ? Tobacco abuse 07/01/2012  ? HYPERTRIGLYCERIDEMIA 06/19/2009  ? Elevated lipids 06/19/2009  ? OBESITY 06/19/2009  ? Essential hypertension 06/19/2009  ? Chronic bronchitis ([place]) 06/19/2009  ? CHEST PAIN 06/19/2009  ? DYSPHAGIA UNSPECIFIED 06/19/2009  ? Coronary atherosclerosis 03/01/2008  ? HEMORRHOIDS 03/01/2008  ? GERD 03/01/2008  ? DIVERTICULOSIS, COLON 03/01/2008  ? Obstructive sleep apnea 03/01/2008  ? COLONIC POLYPS, HX OF 03/01/2008  ? ?PCP:  Susy Frizzle, MD ?Pharmacy:   ?[facility] [place], [address] ?[place] [address] ?Phone: [phone] Fax: [phone] ? ? ? ? ?Social Determinants of Health (SDOH) Interventions ?  ? ?Readmission Risk Interventions ?   ? View : No data to display.  ?  ?  ?  ? ? ? ?

## 2022-02-17 NOTE — Anesthesia Procedure Notes (Signed)
Procedure Name: Intubation ?Date/Time: 02/17/2022 9:24 AM ?Performed by: Darral Dash, DO ?Pre-anesthesia Checklist: Patient identified, Emergency Drugs available, Suction available and Patient being monitored ?Patient Re-evaluated:Patient Re-evaluated prior to induction ?Oxygen Delivery Method: Circle system utilized ?Preoxygenation: Pre-oxygenation with 100% oxygen ?Induction Type: IV induction ?Ventilation: Mask ventilation without difficulty and Oral airway inserted - appropriate to patient size ?Laryngoscope Size: Sabra Heck and 3 ?Grade View: Grade I ?Tube type: Oral ?Tube size: 8.0 mm ?Number of attempts: 2 (anterior larynx, first attempt miller 2) ?Airway Equipment and Method: Stylet and Oral airway ?Placement Confirmation: ETT inserted through vocal cords under direct vision, positive ETCO2 and breath sounds checked- equal and bilateral ?Tube secured with: Tape ?Dental Injury: Teeth and Oropharynx as per pre-operative assessment  ? ? ? ? ?

## 2022-02-18 ENCOUNTER — Encounter (HOSPITAL_COMMUNITY): Payer: Self-pay | Admitting: Vascular Surgery

## 2022-02-18 DIAGNOSIS — I724 Aneurysm of artery of lower extremity: Secondary | ICD-10-CM

## 2022-02-18 LAB — BASIC METABOLIC PANEL
Anion gap: 10 (ref 5–15)
BUN: 25 mg/dL — ABNORMAL HIGH (ref 8–23)
CO2: 20 mmol/L — ABNORMAL LOW (ref 22–32)
Calcium: 8.1 mg/dL — ABNORMAL LOW (ref 8.9–10.3)
Chloride: 104 mmol/L (ref 98–111)
Creatinine, Ser: 1.66 mg/dL — ABNORMAL HIGH (ref 0.61–1.24)
GFR, Estimated: 45 mL/min — ABNORMAL LOW (ref >60)
Glucose, Bld: 165 mg/dL — ABNORMAL HIGH (ref 70–99)
Potassium: 4 mmol/L (ref 3.5–5.1)
Sodium: 134 mmol/L — ABNORMAL LOW (ref 135–145)

## 2022-02-18 LAB — CBC
HCT: 22.6 % — ABNORMAL LOW (ref 39.0–52.0)
Hemoglobin: 8 g/dL — ABNORMAL LOW (ref 13.0–17.0)
MCH: 31.9 pg (ref 26.0–34.0)
MCHC: 35.4 g/dL (ref 30.0–36.0)
MCV: 90 fL (ref 80.0–100.0)
Platelets: 147 10*3/uL — ABNORMAL LOW (ref 150–400)
RBC: 2.51 MIL/uL — ABNORMAL LOW (ref 4.22–5.81)
RDW: 12.1 % (ref 11.5–15.5)
WBC: 12.4 10*3/uL — ABNORMAL HIGH (ref 4.0–10.5)
nRBC: 0 % (ref 0.0–0.2)

## 2022-02-18 NOTE — Progress Notes (Addendum)
[facility] ? ?Subjective  - Doing better, feels softer left groin ? ? ?Objective ?120/68 ?92 ?98.5 ?F (36.9 ?C) (Oral) ?16 ?100% ? ?Intake/Output Summary (Last 24 hours) at 02/18/2022 0747 ?Last data filed at 02/18/2022 0500 ?Gross per 24 hour  ?Intake 2320 ml  ?Output 1500 ml  ?Net 820 ml  ? ?Left groin softer to palpation, ecchymosis present ?Doppler signals brisk B DP/PT ?Motor and sensation intact ?Lungs non labored breathing ? ?Assessment/Planning: ?POD # 1 Repair of left common femoral artery pseudoaneurysm Left LE thrombectomy ?Left common femoral endarterectomy with bovine pericardial patch angioplasty ?Improved inflow B LE ?F/U 2-3 weeks for incision ?D/C Al[place] ?Stable from a vascular point of view. ? ?Cont ASA, Plavix and Statin ? ?Roxy Horseman ?02/18/2022 ?7:47 AM ?-- ? ?Laboratory ?Lab Results: ?Recent Labs  ?  02/16/22 ?1456 02/17/22 ?0320  ?WBC 8.2 7.2  ?HGB 11.6* 11.0*  ?HCT 33.7* 32.7*  ?PLT 140* 141*  ? ?BMET ?Recent Labs  ?  02/16/22 ?1456 02/17/22 ?0320  ?NA 137 135  ?K 4.2 4.4  ?CL 105 107  ?CO2 23 22  ?GLUCOSE 98 103*  ?BUN 22 19  ?CREATININE 1.68* 1.55*  ?CALCIUM 8.8* 8.4*  ? ? ?COAG ?Lab Results  ?Component Value Date  ? INR 1.1 02/17/2022  ? INR 1.08 03/10/2017  ? INR 1.09 12/03/2015  ? ?No results found for: PTT ? ?I have seen and evaluated the patient. I agree with the PA note as documented above.  Postop day 1 status post repair of left common femoral artery pseudoaneurysm including left common femoral endarterectomy with left lower extremity thrombectomy and bovine pericardial patch.  Left groin looks good today.  He has brisk DP PT signals in the left foot.  We will arrange follow-up in 2 to 3 weeks for incision check.  Looks good from my standpoint. ? ?Marty Heck, MD ?[facility] ?Office: [phone] ? ? ?

## 2022-02-18 NOTE — Progress Notes (Signed)
Per central telemetry, patient had 3 beat run of Vtach at approximately 0141. Patient asymptomatic on assessment. Denies shortness of breath, palpitations, chest pain. No acute distress.  ?

## 2022-02-18 NOTE — Anesthesia Postprocedure Evaluation (Signed)
Anesthesia Post Note ? ?Patient: [name] ? ?Procedure(s) Performed: Repair of Left COMMON FEMORAL PSEUDOANEURYSM. (Left: Groin) ?INTRA OPERATIVE ARTERIOGRAM (Left: Leg Upper) ?ENDARTERECTOMY FEMORAL (Left: Leg Lower) ?PATCH ANGIOPLASTY Left Common Femoral Artery. (Left: Groin) ? ?  ? ?Patient location during evaluation: PACU ?Anesthesia Type: General ?Level of consciousness: awake and alert ?Pain management: pain level controlled ?Vital Signs Assessment: post-procedure vital signs reviewed and stable ?Respiratory status: spontaneous breathing, nonlabored ventilation, respiratory function stable and patient connected to nasal cannula oxygen ?Cardiovascular status: blood pressure returned to baseline and stable ?Postop Assessment: no apparent nausea or vomiting ?Anesthetic complications: no ? ? ?No notable events documented. ? ?Last Vitals:  ?Vitals:  ? 02/18/22 0900 02/18/22 1054  ?BP: 130/67 122/67  ?Pulse: 78 81  ?Resp: (!) 22 20  ?Temp:  37 ?C  ?SpO2: 99% 98%  ?  ?Last Pain:  ?Vitals:  ? 02/18/22 1152  ?TempSrc:   ?PainSc: 2   ? ? ?  ?  ?  ?  ?  ?  ? ?March Rummage [name] ? ? ? ? ?

## 2022-02-18 NOTE — Progress Notes (Signed)
Mobility Specialist Progress Note ? ? 02/18/22 1201  ?Mobility  ?Activity Ambulated independently in hallway  ?Level of Assistance Modified independent, requires aide device or extra time  ?Assistive Device Front wheel walker  ?Distance Ambulated (ft) 470 ft  ?Activity Response Tolerated well  ?$Mobility charge 1 Mobility  ? ?Pre Mobility: 72 HR, 156/85 BP, 100% SpO2 on RA ?During Mobility: 101 HR, 94% SpO2 on RA ?Post Mobility: 94 HR, 129/64 BP, 100% SpO2 on RA ? ?Received pt in chair c/o slight left groin tenderness but agreeable to mobility. Asymptomatic throughout ambulation, returned back to bed w/ groin sites stable and call bell in reach. ? ?Holland Falling ?Mobility Specialist ?Phone Number [phone] ? ?

## 2022-02-18 NOTE — Discharge Summary (Addendum)
?Discharge Summary  ?  ?Patient ID: [name] ?MRN: [id number]; DOB: [birth year] ? ?Admit date: 02/16/2022 ?Discharge date: 02/18/2022 ? ?PCP:  Susy Frizzle, MD ?  ?[facility] Providers ?Cardiologist:  Jenkins Rouge, MD    ? ? ?Discharge Diagnoses  ?  ?Principal Problem: ?  Pseudoaneurysm of left femoral artery ([place]) ?Active Problems: ?  Femoral artery pseudoaneurysm complicating cardiac catheterization [facility]) ? ? ? ?Diagnostic Studies/Procedures  ?  ?PSA repair ?_____________ ?  ?History of Present Illness   ?  ?MASATO PETTIE is a 66 y.o. male with hx of CAD and CABG  X 4 in 2014 (LIMA-mid LAD, SVG-RPDA, SVG-1st diag, SVG-mid LCX (100%) occluded, s/p prox to mid LCX PCI 2019, HTN, HLD, OSA, symptomatic PAD, ongoing tobacco use who presented on 02/16/2022 for the evaluation of left groin pseudoaneurysm. Patient was here on 4/20 for peripheral angiogram with Dr Gwenlyn Found for claudication- underwent left femoral access and s/p hawk 1 directional atherectomy followed by [facility] of mid right SFA. Following procedure he had brusing, echymosis and hematoma- all of this was managed with manual pressure and site was marked. He was discharged the next day. Remains on aspirin and plavix. On 4/21 before discharge he had extensive ecchymosis of groin and upper thigh but was stable ?  ?He presented back with complaints of severe pain in the left groin with extensive brusing and swelling of his scrotum. Denies any fevers, chills, numbness. The leg sensation was intact ?  ?ER work up: [place] left groin was performed and shows an area with well defined borders measuring 3.4 cm x 4.4 cm was visualized  arising off of the CFA with ultrasound characteristics of a pseudoaneurysm. The neck measures approximately 1.2 cm wide and 3.4 cm  long.  ? ?He was admitted to cardiology with VVS consult and planned for open repair of left common femoral artery PSA. ?   ?Hospital Course  ?   ?Consultants: VVS  ? ?PSA of left femoral artery: seen by  Dr. Roselie Awkward and underwent repair of left common femoral artery pseudoaneurysm with left LE thrombectomy endarterectomy with bovine pericardial patch angioplasty. Seen by VVS and cleared for discharge home with follow up arranged in the office.  ?-- continue on ASA, statin, plavix ? ?PAD: recent s/p hawk 1 directional atherectomy followed by [facility] of mid right SFA ?-- continue ASA, plavix ? ?HTN: continue Hyzaar ? ?HLD: on crestor 73m twice a week ?-- refer to lipid clinic at discharge  ? ?Patient seen by Dr. CBurt Knackand deemed stable for discharge home. Follow up in the office has been arranged. ?_____________ ? ?Discharge Vitals ?Blood pressure 122/67, pulse 81, temperature 98.6 ?F (37 ?C), temperature source Oral, resp. rate 20, height 5' 9.02" (1.753 m), weight 95.3 kg, SpO2 98 %.  ?Filed Weights  ? 02/17/22 0248204/24/23 0839  ?Weight: 95.3 kg 95.3 kg  ? ? ?Labs & Radiologic Studies  ?  ?CBC ?Recent Labs  ?  02/17/22 ?0320 02/18/22 ?05003 ?WBC 7.2 12.4*  ?HGB 11.0* 8.0*  ?HCT 32.7* 22.6*  ?MCV 90.6 90.0  ?PLT 141* 147*  ? ?Basic Metabolic Panel ?Recent Labs  ?  02/17/22 ?0320 02/18/22 ?07048 ?NA 135 134*  ?K 4.4 4.0  ?CL 107 104  ?CO2 22 20*  ?GLUCOSE 103* 165*  ?BUN 19 25*  ?CREATININE 1.55* 1.66*  ?CALCIUM 8.4* 8.1*  ? ?Liver Function Tests ?No results for input(s): AST, ALT, ALKPHOS, BILITOT, PROT, ALBUMIN in the last 72 hours. ?No  results for input(s): LIPASE, AMYLASE in the last 72 hours. ?High Sensitivity Troponin:   ?No results for input(s): TROPONINIHS in the last 720 hours.  ?BNP ?Invalid input(s): POCBNP ?D-Dimer ?No results for input(s): DDIMER in the last 72 hours. ?Hemoglobin A1C ?No results for input(s): HGBA1C in the last 72 hours. ?Fasting Lipid Panel ?No results for input(s): CHOL, HDL, LDLCALC, TRIG, CHOLHDL, LDLDIRECT in the last 72 hours. ?Thyroid Function Tests ?No results for input(s): TSH, T4TOTAL, T3FREE, THYROIDAB in the last 72 hours. ? ?Invalid input(s): FREET3 ?_____________  ?PERIPHERAL  VASCULAR CATHETERIZATION ? ?Result Date: 02/13/2022 ?Images from the original result were not included.  [id number] LOCATION:  FACILITY: [facility] PHYSICIAN: Quay Burow, M.D. 23-Jun-1956 DATE OF PROCEDURE:  02/13/2022 DATE OF DISCHARGE: PV Angiogram/Intervention History obtained from chart review.TYLER CUBIT is a 66 y.o. mildly overweight married Caucasian male father of 106, grandfather to 1 grandchild who is accompanied by his wife Juliann Pulse today.  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  He was referred by Dr. Paulla Dolly, his podiatrist, for symptomatic PAD.  He has a history of CAD having undergone coronary artery bypass grafting x4 by Dr. Cyndia Bent in 2014.  He has treated hypertension, hyperlipidemia and obstructive sleep apnea.  He has a long history tobacco abuse smoking 100 pack years now smoking 1 pack a day down from 2 packs a day.  I catheterized him 04/19/2018 and stented his circumflex.  His heart is stable.  He has had claudication right equal to left for at least a year with Dopplers that show high-grade mid right SFA stenosis and moderate left SFA stenosis.  He wishes to proceed with angiography and intervention. Pre Procedure Diagnosis: Peripheral arterial disease/claudication Post Procedure Diagnosis: Peripheral arterial disease/claudication Operators: Dr. Quay Burow Procedures Performed:  1.  Ultrasound-guided left common femoral access  2.  Abdominal aortogram/bilateral iliac angiogram/bifemoral runoff  3.  Contralateral access (second-order catheter placement)  4.  Placement of a spider distal protection device right popliteal artery             5.  Hawk 1 directional atherectomy followed by DCB mid right SFA PROCEDURE DESCRIPTION: The patient was brought to the second floor [place] [facility] cath lab in the the postabsorptive state. He was premedicated with IV Versed and fentanyl. His left groin was prepped and shaved in usual sterile fashion. Xylocaine 1% was used for  local anesthesia. A 5 French sheath was inserted into the left common femoral artery using standard Seldinger technique.  Ultrasound was used to identify the left common femoral and guide access.  Digital image was captured and placed in the patient's chart.  A 5 French pigtail catheter was placed in distal abdominal aorta.  Distal abdominal aortography, bilateral iliac angiography with bifemoral runoff was performed using bolus chase, digital subtraction and step table technique.  Omnipaque dye was used for the entirety of the case (130 cc of contrast total to patient).  Retrograde aortic pressures monitored in the case.  Angiographic Data: 1: Abdominal aorta-widely patent 2: Left lower extremity-tandem 90% stenoses in the proximal, mid, and distal left SFA as well as in the P1 segment of the left popliteal artery. 3: 60% segmental mid right SFA stenosis with a focal 95% stenosis at the distal edge of this.  There was a 50% distal right SFA stenosis with two-vessel runoff (anterior tibial is occluded).  ? ?Mr. Dingley has bilateral SFA disease and lifestyle-limiting claudication.  We will proceed with [facility] 1  directional atherectomy followed by [facility] of the mid right SFA using spider distal protection.  The patient was already on aspirin and clopidogrel at home. Procedure Description: Contralateral access was obtained with a crossover catheter, endhole catheter and versa core wire.  I exchanged the 5 [place] sheath for a 7 French 55 cm multipurpose Ansell sheath and placed it in the right common femoral artery.  I then was able with some difficulty to cross the diseased segment with an 014 [place] 6 g wire which I placed in the right popliteal artery.  I then placed a 6 mm spider distal protection device over this in the P2 segment of the right popliteal artery. Following this I performed directional atherectomy with a Hawk 1 LX device performing multiple circumferential cuts removing a copious amount of  atherosclerotic plaque.  Following this I performed DCB with a 5 mm x 150 mm long Medtronic impact Admiral DCB at 4 atm for 2 and half minutes resulting in reduction of a 60% segmental followed by 95% fairly focal le

## 2022-02-20 ENCOUNTER — Telehealth: Payer: Self-pay | Admitting: *Deleted

## 2022-02-20 ENCOUNTER — Ambulatory Visit (HOSPITAL_COMMUNITY)
Admission: RE | Admit: 2022-02-20 | Discharge: 2022-02-20 | Disposition: A | Discharge status: Home / Self Care | Payer: BC Managed Care – PPO | Source: Ambulatory Visit | Attending: Cardiology | Admitting: Cardiology

## 2022-02-20 DIAGNOSIS — I739 Peripheral vascular disease, unspecified: Secondary | ICD-10-CM

## 2022-02-20 NOTE — Telephone Encounter (Signed)
Patient called and states he has scrotal swelling and asked for instructions for this . Instructed patient to roll up a wash cloth and tuck it under scrotum to help with swelling advised patient swelling can last 7-10days. Patient also asked about dropping off paperwork for his job. Patient advised to drop it off at front dest we will notify him when paperwork is completed. ?

## 2022-02-25 ENCOUNTER — Other Ambulatory Visit: Payer: Self-pay

## 2022-02-25 DIAGNOSIS — I739 Peripheral vascular disease, unspecified: Secondary | ICD-10-CM

## 2022-02-26 ENCOUNTER — Ambulatory Visit: Payer: BC Managed Care – PPO | Admitting: Pharmacist

## 2022-02-26 ENCOUNTER — Telehealth: Payer: Self-pay | Admitting: Pharmacist

## 2022-02-26 VITALS — BP 137/84 | HR 82 | Resp 17 | Ht 69.0 in | Wt 211.8 lb

## 2022-02-26 DIAGNOSIS — I2584 Coronary atherosclerosis due to calcified coronary lesion: Secondary | ICD-10-CM

## 2022-02-26 DIAGNOSIS — I739 Peripheral vascular disease, unspecified: Secondary | ICD-10-CM | POA: Diagnosis not present

## 2022-02-26 DIAGNOSIS — I2510 Atherosclerotic heart disease of native coronary artery without angina pectoris: Secondary | ICD-10-CM

## 2022-02-26 DIAGNOSIS — E782 Mixed hyperlipidemia: Secondary | ICD-10-CM | POA: Diagnosis not present

## 2022-02-26 DIAGNOSIS — E785 Hyperlipidemia, unspecified: Secondary | ICD-10-CM

## 2022-02-26 DIAGNOSIS — I200 Unstable angina: Secondary | ICD-10-CM

## 2022-02-26 NOTE — Telephone Encounter (Signed)
Please complete prior authorization for: ? ?Name of medication, dose, and frequency repatha '140mg'$  sq 14 days or Praluent '75mg'$  sq q 14 days ? ?Lab Orders Requested? yes ? ?Which labs? Lipid panel ? ?Estimated date for labs to be scheduled 2-3 months ? ?Does patient need activated copay card? Yes  ?

## 2022-02-26 NOTE — Progress Notes (Signed)
Patient ID: [name]                 DOB: 1956/07/07                    MRN: [id number] ? ? ? ? ?HPI: ?[name] is a 66 y.o. male patient referred to lipid clinic by Dr Gwenlyn Found. PMH is significant for PAD, CAD, CABG x 4, smoking, HTN, HLD, OSA, aortic atherosclerosis, and statin intolerance.  Currently managed on rosuvastatin '5mg'$  twice weekly. ? ?Patient presents today with wife in good spirits. Reports he feels much better after recent vascular procedures.  Is unable to tolerate high intensity statins. ? ?Reports strong family history of CAD. All his brothers have history of CABG.  Patient continues to smoke over a pack of cigarettes a day. ? ?Current Medications: rosuvastatin '5mg'$  twice a week ? ?Intolerances:  ?High dose Rosuvastatin ?Atorvastatin ? ?Risk Factors:  ?PAD ?CAD ?Hx of CABG ?Smoking ?Family history ?HTN ?HLD ? ?LDL goal: <55 ? ? ?Labs: TC 132, Trigs 120, HDL 31, LDL 77 (02/14/22 on rosuvastatin '5mg'$  twice weekly) ? ?Past Medical History:  ?Diagnosis Date  ? Cervical spine ankylosis   ? CKD (chronic kidney disease) stage 3, GFR 30-59 ml/min (HCC)   ? COLONIC POLYPS, HX OF   ? CORONARY ARTERY DISEASE   ? a. s/p stent to RCA 2005. b. Stent to Cx 2007. c.  CABG X4 2014; d. LHC 04/19/18 occluded SVG-Circ, DES to in-stent restenosis of circ.   ? COVID-19 2020  ? Diverticulosis   ? DIVERTICULOSIS, COLON   ? DYSPHAGIA UNSPECIFIED   ? intermittent  ? GERD   ? HEMORRHOIDS   ? HYPERLIPIDEMIA   ? HYPERTENSION   ? HYPERTRIGLYCERIDEMIA   ? Left carotid artery occlusion   ? OBESITY   ? Postoperative atrial fibrillation ([place]) 2014  ? Pseudoaneurysm of left femoral artery ([place]) 02/16/2022  ? SLEEP APNEA   ? on CPAP  ? Statin intolerance   ? Tobacco abuse   ? Vertebral artery occlusion, left   ? ? ?Current Outpatient Medications on File Prior to Visit  ?Medication Sig Dispense Refill  ? acetaminophen (TYLENOL) 500 MG tablet Take 1,000 mg by mouth every 6 (six) hours as needed for mild pain.    ? aspirin EC 81 MG  tablet Take 1 tablet (81 mg total) by mouth daily. 90 tablet 3  ? carboxymethylcellulose 1 % ophthalmic solution Place 1 drop into both eyes daily as needed (dry eyes).    ? clopidogrel (PLAVIX) 75 MG tablet TAKE 1 TABLET BY MOUTH ONCE A DAY (Patient taking differently: Take 75 mg by mouth daily.) 90 tablet 3  ? losartan-hydrochlorothiazide (HYZAAR) 100-12.5 MG tablet TAKE 1 TABLET BY MOUTH ONCE A DAY (Patient taking differently: Take 1 tablet by mouth daily.) 90 tablet 2  ? nitroGLYCERIN (NITROSTAT) 0.4 MG SL tablet Place 1 tablet (0.4 mg total) under the tongue every 5 (five) minutes as needed for chest pain. 25 tablet 2  ? pantoprazole (PROTONIX) 40 MG tablet TAKE 1 TABLET BY MOUTH ONCE A DAY (Patient taking differently: Take 40 mg by mouth daily.) 90 tablet 1  ? Probiotic Product (PROBIOTIC-10 ULTIMATE) CAPS Take 1 capsule by mouth daily.    ? rosuvastatin (CRESTOR) 5 MG tablet TAKE 1 TABLET BY MOUTH ONCE A DAY (Patient taking differently: Take 5 mg by mouth 2 (two) times a week.) 90 tablet 1  ? diphenoxylate-atropine (LOMOTIL) 2.5-0.025 MG tablet Take 2  tablets by mouth 4 (four) times daily as needed for diarrhea or loose stools. (Patient not taking: Reported on 02/26/2022) 30 tablet 0  ? ?No current facility-administered medications on file prior to visit.  ? ? ?Allergies  ?Allergen Reactions  ? Statins Other (See Comments)  ?  Bone and muscle pain  ? Crestor [Rosuvastatin]   ?  myalgias  ? Lipitor [Atorvastatin]   ?  myalgias  ? Oxycodone Other (See Comments)  ? Hydrocodone Nausea And Vomiting  ? ? ?Assessment/Plan: ? ?1. Hyperlipidemia - Patient current LDL 77 which is above goal of <55 on rosuvastatin '5mg'$  twice weekly.  Aggressive goal selected because of patient's numerous risk factors such as PAD, CAD, smoking, and family history.  Patient needs aggressive lipid lowering.  Recommend PCSK9i.  Using demo pen educated patient on mechanism of action, storage, site selection, administration, and possible adverse  effects.  Patient;s wife voiced understanding and will likely administer injections.  Will complete PA and activate copay card.  Recheck lipid panel in 2-3 months. ? ?Continue rosuvastatin '5mg'$  twice weekly ?Start Repatha/Praluent sq q 14 days ?Recheck lipid panel in 2-3 months ? ?Karren Cobble, PharmD, BCACP, [place], CPP ?[address], [address] ?[place], [place], [address] ?Phone: [phone], Fax: [phone]    ?  ?

## 2022-02-26 NOTE — Patient Instructions (Addendum)
It was nice meeting you two today ? ?We would like to start you on a new medication called Repatha or Praluent ? ?I will complete the prior authorization for you and contact you when it is complete.  We will also activate a copay card for you ? ?Once you start the medication we will recheck your cholesterol in 2-3 months ? ?Please call with any questions! ? ?Karren Cobble, PharmD, BCACP, [place], CPP ?[address], [address] ?[place], [place], [address] ?Phone: [phone], Fax: [phone]  ? ? ? ?

## 2022-02-27 MED ORDER — PRALUENT 75 MG/ML ~~LOC~~ SOAJ: 75.0000 mg | SUBCUTANEOUS | 11 refills | Status: DC

## 2022-02-27 NOTE — Telephone Encounter (Signed)
Called and lmom pt that they were approved for praluent [id number], rx sent, advised pt to call to get copay card at [phone]). Also instructed them to complete fasting labs post 4th dose  ?

## 2022-02-28 ENCOUNTER — Ambulatory Visit: Payer: BC Managed Care – PPO | Admitting: Cardiovascular Disease

## 2022-03-05 ENCOUNTER — Telehealth: Payer: Self-pay | Admitting: *Deleted

## 2022-03-05 NOTE — Telephone Encounter (Signed)
? ?  Pre-operative Risk Assessment  ?  ?Patient Name: [name]  ?DOB: Sep 11, 1956 ?MRN: [id number]  ? ?  ? ?Request for Surgical Clearance   ? ?Procedure:   cataract extraction by PE, IOL- right then left  ? ?Date of Surgery:  Clearance 03/19/22                              ?   ?Surgeon:  Dr Antionette Fairy [place] ?Surgeon's Group or Practice Name:   [facility] ?Phone number:  [phone] ?Fax number:  [phone] ?  ?Type of Clearance Requested:   ?- Medical  ?- Pharmacy:  Hold Aspirin and Clopidogrel (Plavix)   ?  ?Type of Anesthesia:   IV SEDATION  ?  ?Additional requests/questions:  Please advise surgeon/provider what medications should be held. ? ?Signed, ?Trixie Dredge V   ?03/05/2022, 5:42 PM   ?

## 2022-03-06 NOTE — Telephone Encounter (Signed)
? ?  Patient Name: [name]  ?DOB: Jun 10, 1956 ?MRN: [id number] ? ?Primary Cardiologist: Jenkins Rouge, MD ? ?Chart reviewed as part of pre-operative protocol coverage. Cataract extractions are recognized in guidelines as low risk surgeries that do not typically require specific preoperative testing or holding of blood thinner therapy. Therefore, given past medical history and time since last visit, based on ACC/AHA guidelines, DEMETRUIS DEPAUL would be at acceptable risk for the planned procedure without further cardiovascular testing.  ? ?I will route this recommendation to the requesting party via Epic fax function and remove from pre-op pool. ? ?Please call with questions. ? ?Deberah Pelton, NP ?03/06/2022, 10:11 AM ? ?

## 2022-03-11 ENCOUNTER — Encounter: Payer: Self-pay | Admitting: Physician Assistant

## 2022-03-11 ENCOUNTER — Ambulatory Visit (INDEPENDENT_AMBULATORY_CARE_PROVIDER_SITE_OTHER): Payer: BC Managed Care – PPO | Admitting: Physician Assistant

## 2022-03-11 ENCOUNTER — Encounter: Payer: Self-pay | Admitting: Vascular Surgery

## 2022-03-11 VITALS — BP 153/76 | HR 68 | Temp 98.1°F | Resp 20 | Ht 69.0 in | Wt 210.3 lb

## 2022-03-11 DIAGNOSIS — I739 Peripheral vascular disease, unspecified: Secondary | ICD-10-CM

## 2022-03-11 NOTE — Progress Notes (Signed)
?POST OPERATIVE OFFICE NOTE ? ? ? ?CC:  F/u for surgery ? ?HPI:  This is a 66 y.o. male who has been followed by DR. Berry for claudication with endovascular intervention of drug coated balloon angioplasty via left common femoral approach on 02/13/22 for right LE intervention.  He developed a left  common femoral pseudoaneurysm.  He was taken to the OR by Dr. Carlis Abbott for repair, thrombectomy and Left common femoral endarterectomy with bovine pericardial patch angioplasty. ? ?Pt returns today for follow up incision checks.  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  His job is active and involves some heavy lifting.  He has not returned to work yet.  He is slowly increasing his walking.  He denies claudication, rest pain or non healing wound son B LE.   ? He is medically managed on ASA, Plavix and Statin.  His Cardiologist is DR. Nishan.   ? ? ?Allergies  ?Allergen Reactions  ? Statins Other (See Comments)  ?  Bone and muscle pain  ? Crestor [Rosuvastatin]   ?  myalgias  ? Lipitor [Atorvastatin]   ?  myalgias  ? Oxycodone Other (See Comments)  ? Hydrocodone Nausea And Vomiting  ? ? ?Current Outpatient Medications  ?Medication Sig Dispense Refill  ? acetaminophen (TYLENOL) 500 MG tablet Take 1,000 mg by mouth every 6 (six) hours as needed for mild pain.    ? Alirocumab (PRALUENT) 75 MG/ML SOAJ Inject 75 mg into the skin every 14 (fourteen) days. 2 mL 11  ? aspirin EC 81 MG tablet Take 1 tablet (81 mg total) by mouth daily. 90 tablet 3  ? carboxymethylcellulose 1 % ophthalmic solution Place 1 drop into both eyes daily as needed (dry eyes).    ? clopidogrel (PLAVIX) 75 MG tablet TAKE 1 TABLET BY MOUTH ONCE A DAY (Patient taking differently: Take 75 mg by mouth daily.) 90 tablet 3  ? diphenoxylate-atropine (LOMOTIL) 2.5-0.025 MG tablet Take 2 tablets by mouth 4 (four) times daily as needed for diarrhea or loose stools. (Patient not taking: Reported on 02/26/2022) 30  tablet 0  ? losartan-hydrochlorothiazide (HYZAAR) 100-12.5 MG tablet TAKE 1 TABLET BY MOUTH ONCE A DAY (Patient taking differently: Take 1 tablet by mouth daily.) 90 tablet 2  ? nitroGLYCERIN (NITROSTAT) 0.4 MG SL tablet Place 1 tablet (0.4 mg total) under the tongue every 5 (five) minutes as needed for chest pain. 25 tablet 2  ? pantoprazole (PROTONIX) 40 MG tablet TAKE 1 TABLET BY MOUTH ONCE A DAY (Patient taking differently: Take 40 mg by mouth daily.) 90 tablet 1  ? Probiotic Product (PROBIOTIC-10 ULTIMATE) CAPS Take 1 capsule by mouth daily.    ? rosuvastatin (CRESTOR) 5 MG tablet TAKE 1 TABLET BY MOUTH ONCE A DAY (Patient taking differently: Take 5 mg by mouth 2 (two) times a week.) 90 tablet 1  ? ?No current facility-administered medications for this visit.  ? ? ? ROS:  See HPI ? ?Physical Exam: ? ? ? ?Incision:  Left groin soft without hematoma or erythema.  There is a palpable healing ridge. ?Extremities:  Motor and sensation are intact B LE ?Heat RRR ?Abdomen:  soft NTTP ? ?02/20/22 ? ?ABI Findings:  ?+---------+------------------+-----+---------+--------+  ?Right    Rt Pressure (mmHg)IndexWaveform Comment   ?+---------+------------------+-----+---------+--------+  ?Brachial 156                                       ?+---------+------------------+-----+---------+--------+  ?  PTA      156               0.99 triphasic          ?+---------+------------------+-----+---------+--------+  ?PERO     147               0.93 biphasic           ?+---------+------------------+-----+---------+--------+  ?DP       140               0.89 biphasic           ?+---------+------------------+-----+---------+--------+  ?Great Toe84                0.53 Abnormal           ?+---------+------------------+-----+---------+--------+  ? ?+---------+------------------+-----+-----------+-------+  ?Left     Lt Pressure (mmHg)IndexWaveform   Comment   ?+---------+------------------+-----+-----------+-------+  ?Brachial 158                                        ?+---------+------------------+-----+-----------+-------+  ?PTA      133               0.84 monophasic          ?+---------+------------------+-----+-----------+-------+  ?PERO     132               0.85 monophasic          ?+---------+------------------+-----+-----------+-------+  ?DP       128               0.81 multiphasic         ?+---------+------------------+-----+-----------+-------+  ?Great Toe89                0.56 Abnormal            ?+---------+------------------+-----+-----------+-------+  ? ?+-------+-----------+-----------+------------+------------+  ?ABI/TBIToday's ABIToday's TBIPrevious ABIPrevious TBI  ?+-------+-----------+-----------+------------+------------+  ?Right  0.99       0.54       0.70        0.33          ?+-------+-----------+-----------+------------+------------+  ?Left   0.85       0.56       0.67        0.38          ?+-------+-----------+-----------+------------+------------+  ? ?Bilateral ABIs appear increased compared to prior study on 01/01/22.  ?   ?Summary:  ?Right: Resting right ankle-brachial index indicates mild right lower  ?extremity arterial disease. The right toe-brachial index is abnormal.  ? ?Left: Resting left ankle-brachial index indicates mild left lower  ?extremity arterial disease. The left toe-brachial index is abnormal.  ? ? ? ?+----------+--------+-----+---------------+---------+--------+  ?RIGHT     PSV cm/sRatioStenosis       Waveform Comments  ?+----------+--------+-----+---------------+---------+--------+  ?CFA Prox  166          30-49% stenosistriphasic          ?+----------+--------+-----+---------------+---------+--------+  ?CFA [id number]                         triphasic          ?+----------+--------+-----+---------------+---------+--------+  ?DFA       88                           triphasic          ?+----------+--------+-----+---------------+---------+--------+  ?  SFA Prox  110                         triphasic          ?+----------+--------+-----+---------------+---------+--------+  ?SFA Mid   110                         triphasic          ?+----------+--------+-----+---------------+---------+--------+  ?SFA Distal69                          triphasic          ?+----------+--------+-----+---------------+---------+--------+  ?POP Prox  87                          biphasic           ?+----------+--------+-----+---------------+---------+--------+  ?POP Distal47                          biphasic           ?+----------+--------+-----+---------------+---------+--------+  ?TP Trunk  78                          triphasic          ?+----------+--------+-----+---------------+---------+--------+  ? ?Summary:  ?Right: 30-49% stenosis noted in the common femoral artery. Right SFA is  ?patent, s/p atherectomy.  ?Marked improvement is noted compared to previous study.  ? ?Assessment/Plan:  This is a 66 y.o. male who is s/p:drug coated balloon angioplasty via left common femoral approach on 02/13/22 for right LE intervention by Dr. Gwenlyn Found.  He developed a left  common femoral pseudoaneurysm 02/17/22.  He was taken to the OR by Dr. Carlis Abbott for repair, thrombectomy and Left common femoral endarterectomy with bovine pericardial patch angioplasty. ? The left groin is healing well without return of hematoma.  Post operative studies show improved inflow B LE.  He is asymptomatic for ischemia.  The groin is still quiet sore and interferes with mobility and certain activities.  He will plan on returning to work 2 weeks from now.  I have suggested her start a walking program at least 3 x a week.  He agrees to do so.   ? He will f/u with repeat studies of Duplex and ABI's in 6-9 months.  If he develops symptoms of ischemia he will call sooner. ? ? ? ?Laurence Slate,  PAC ?[facility] ?[phone] ? ? ?Clinic MD:  Carlis Abbott ?

## 2022-03-17 ENCOUNTER — Telehealth: Payer: Self-pay | Admitting: Cardiovascular Disease

## 2022-03-17 NOTE — Telephone Encounter (Signed)
Just FYI--Patient no longer wants to follow up with Dr. Gwenlyn Found. He states he will been transferring all PV care to [facility] since having his procedure with their group. He plans to continue following up with them.

## 2022-03-17 NOTE — Telephone Encounter (Signed)
Routed to MD to make aware 

## 2022-03-20 ENCOUNTER — Ambulatory Visit: Payer: BC Managed Care – PPO | Admitting: Cardiovascular Disease

## 2022-05-07 ENCOUNTER — Telehealth: Payer: Self-pay | Admitting: Cardiovascular Disease

## 2022-05-07 ENCOUNTER — Other Ambulatory Visit: Payer: Self-pay | Admitting: Cardiovascular Disease

## 2022-05-07 DIAGNOSIS — E782 Mixed hyperlipidemia: Secondary | ICD-10-CM

## 2022-05-07 DIAGNOSIS — I2510 Atherosclerotic heart disease of native coronary artery without angina pectoris: Secondary | ICD-10-CM

## 2022-05-07 DIAGNOSIS — N183 Chronic kidney disease, stage 3 unspecified: Secondary | ICD-10-CM

## 2022-05-07 DIAGNOSIS — I10 Essential (primary) hypertension: Secondary | ICD-10-CM

## 2022-05-07 NOTE — Telephone Encounter (Signed)
Yes can have lipid panel drawn at appointment with Dr Johnsie Cancel as long as he is fasting.  Lab order is already placed but patient will need scheduling

## 2022-05-07 NOTE — Telephone Encounter (Signed)
Called patient back. Patient is being seen by [facility] for his PAD. Patient is on Praluent and sees PharmD for this. Patient needs to know when and where to get his lab work done for his lipids. Made patient an appointment to see Dr. Johnsie Cancel next week for over due follow up. Will call patient back with lab appointment once that information is obtained.

## 2022-05-07 NOTE — Telephone Encounter (Signed)
New Message:    Patient said he had been referred to the pharmacist for his Cholesterol by Dr Gwenlyn Found. He wanted to check with Dr Johnsie Cancel to see if he could take care of his Cholesterol.? He said he did not want to keep seeing different providers.He wanted Dr Johnsie Cancel to take care everything. He have not heard anything else about his Cholesterol. Is it time for it to be checked again?[name]

## 2022-05-07 NOTE — Telephone Encounter (Signed)
Called patient back. He will be getting lab work done after his visit with Dr. Johnsie Cancel. Patient will make sure he is fasting at that time.

## 2022-05-08 NOTE — Progress Notes (Signed)
CARDIOLOGY OFFICE NOTE  Date:  05/08/2022    [name] Date of Birth: Sep 08, 1956 Medical Record #[id number]  PCP:  Susy Frizzle, MD  Cardiologist:  Gillian Shields    History of Present Illness: [name] is a 66 y.o. male has a history  of CAD s/p PCI of RCA 2005, PCI of Cx 2007, CABG x 4 in 2014, PCI of OM 03/2018, post-op atrial fib (on amiodarone briefly), OSA on CPAP, hypertension, hyperlipidemia, obesity, GERD, CKD stage III, ongoing tobacco abuse & unable to take high dose statin now on Praluent Needs updated labs today    Left heart cath on 04/19/18 which revealed an occluded SVG to circumflex obtuse marginal branch. He had high-grade obtuse marginal branch in-stent restenosis of old stent and a DES was placed to open that stented area with good result. DAPT for minimum of 12 months recommended. Referred to the lipid clinic. Continued to smoke. Lung cancer screening CT negative 11/2019.   Seen in ER 05/01/21 for chest pain started when climbing through window and radiated to back R/O no acute ECG changes CT no dissection chronic focal dissection vs ulcer infra renal Ao stable since 11/22/17 emphysema and bilateral RAS  CXR subsegmental left lung atelectasis D/c home  PVD followed by Dr Gwenlyn Found and Carlis Abbott Had left common femoral pseudoaneurysm needing OR repair on 02/17/22 This is after access For right SFA atherectomy ABI"s improved from 0.7 range to 0.99 on right and 0.85 on left   Still smoking discussed f/u CT BP up this am but did not take his meds this am    Past Medical History:  Diagnosis Date   Cervical spine ankylosis    CKD (chronic kidney disease) stage 3, GFR 30-59 ml/min (HCC)    COLONIC POLYPS, HX OF    CORONARY ARTERY DISEASE    a. s/p stent to RCA 2005. b. Stent to Cx 2007. c.  CABG X4 2014; d. LHC 04/19/18 occluded SVG-Circ, DES to in-stent restenosis of circ.    COVID-19 2020   Diverticulosis    DIVERTICULOSIS, COLON    DYSPHAGIA UNSPECIFIED     intermittent   GERD    HEMORRHOIDS    HYPERLIPIDEMIA    HYPERTENSION    HYPERTRIGLYCERIDEMIA    Left carotid artery occlusion    OBESITY    Postoperative atrial fibrillation ([place]) 2014   Pseudoaneurysm of left femoral artery ([place]) 02/16/2022   SLEEP APNEA    on CPAP   Statin intolerance    Tobacco abuse    Vertebral artery occlusion, left     Past Surgical History:  Procedure Laterality Date   ABDOMINAL AORTOGRAM W/LOWER EXTREMITY N/A 02/13/2022   Procedure: ABDOMINAL AORTOGRAM W/LOWER EXTREMITY;  Surgeon: Lorretta Harp, MD;  Location: [facility];  Service: Cardiovascular;  Laterality: N/A;   ACHILLES TENDON REPAIR     BACK SURGERY     lower back   CARDIAC CATHETERIZATION     CORONARY ARTERY BYPASS GRAFT  08/18/2012   Procedure: CORONARY ARTERY BYPASS GRAFTING (CABG);  Surgeon: Gaye Pollack, MD;  Location: [facility];  Service: Open Heart Surgery;  Laterality: N/A;  CABG x four;  using left internal mammary artery and right leg greater saphenous vein harvested endoscopically   CORONARY STENT INTERVENTION N/A 04/19/2018   Procedure: CORONARY STENT INTERVENTION;  Surgeon: Lorretta Harp, MD;  Location: [place] [facility];  Service: Cardiovascular;  Laterality: N/A;   ENDARTERECTOMY FEMORAL Left 02/17/2022  Procedure: ENDARTERECTOMY FEMORAL;  Surgeon: Marty Heck, MD;  Location: [facility];  Service: Vascular;  Laterality: Left;   INTRAOPERATIVE ARTERIOGRAM Left 02/17/2022   Procedure: INTRA OPERATIVE ARTERIOGRAM;  Surgeon: Marty Heck, MD;  Location: [facility];  Service: Vascular;  Laterality: Left;   KNEE ARTHROSCOPY     RIGHT   LEFT HEART CATH AND CORS/GRAFTS ANGIOGRAPHY N/A 04/19/2018   Procedure: LEFT HEART CATH AND CORS/GRAFTS ANGIOGRAPHY;  Surgeon: Lorretta Harp, MD;  Location: [facility];  Service: Cardiovascular;  Laterality: N/A;   LUMBAR LAMINECTOMY/DECOMPRESSION MICRODISCECTOMY N/A 12/05/2015   Procedure: LUMBAR 2-3, LUMBAR 3-4  DECOMPRESSION ;  Surgeon: Phylliss Bob, MD;  Location: [facility];  Service: Orthopedics;  Laterality: N/A;   PATCH ANGIOPLASTY Left 02/17/2022   Procedure: PATCH ANGIOPLASTY Left Common Femoral Artery.;  Surgeon: Marty Heck, MD;  Location: [facility];  Service: Vascular;  Laterality: Left;   PERIPHERAL VASCULAR ATHERECTOMY  02/13/2022   Procedure: PERIPHERAL VASCULAR ATHERECTOMY;  Surgeon: Lorretta Harp, MD;  Location: [facility];  Service: Cardiovascular;;  Rt SFA   Right long finger extensor tendon repair     THROMBECTOMY FEMORAL ARTERY Left 02/17/2022   Procedure: Repair of Left COMMON FEMORAL PSEUDOANEURYSM.;  Surgeon: Marty Heck, MD;  Location: [facility];  Service: Vascular;  Laterality: Left;     Medications: No outpatient medications have been marked as taking for the 05/15/22 encounter (Appointment) with Kyle Hector, MD.     Allergies: Allergies  Allergen Reactions   Statins Other (See Comments)    Bone and muscle pain   Crestor [Rosuvastatin]     myalgias   Lipitor [Atorvastatin]     myalgias   Oxycodone Other (See Comments)   Hydrocodone Nausea And Vomiting    Social History: The patient  reports that he has been smoking cigarettes. He has a 64.50 pack-year smoking history. He has been exposed to tobacco smoke. He has never used smokeless tobacco. He reports current alcohol use. He reports that he does not use drugs.   Family History: The patient's family history includes Cancer in his mother; Heart disease in his brother.   Review of Systems: Please see the history of present illness.   All other systems are reviewed and negative.   Physical Exam: VS:  There were no vitals taken for this visit. [name]  BMI There is no height or weight on file to calculate BMI.  Wt Readings from Last 3 Encounters:  03/11/22 210 lb 4.8 oz (95.4 kg)  02/26/22 211 lb 12.8 oz (96.1 kg)  02/17/22 210 lb (95.3 kg)    Affect appropriate Chronically ill male  HEENT:  normal Neck supple with no adenopathy JVP normal no bruits no thyromegaly Lungs COPD exp wheezing  Heart:  S1/S2 no murmur, no rub, gallop or click PMI normal post sternotomy Abdomen: benighn, BS positve, no tenderness, no AAA no bruit.  No HSM or HJR Pedal pulses palpable with scar in left femoral area from surgery  No edema Neuro non-focal Skin warm and dry No muscular weakness    LABORATORY DATA:  EKG:  EKG is ordered today.  Personally reviewed by me. This demonstrates NSR - HR is 77.  Lab Results  Component Value Date   WBC 12.4 (H) 02/18/2022   HGB 8.0 (L) 02/18/2022   HCT 22.6 (L) 02/18/2022   PLT 147 (L) 02/18/2022   GLUCOSE 165 (H) 02/18/2022   CHOL 132 02/14/2022   TRIG 120 02/14/2022  HDL 31 (L) 02/14/2022   LDLDIRECT 77.2 07/10/2009   LDLCALC 77 02/14/2022   ALT 56 (H) 06/24/2021   AST 49 (H) 06/24/2021   NA 134 (L) 02/18/2022   K 4.0 02/18/2022   CL 104 02/18/2022   CREATININE 1.66 (H) 02/18/2022   BUN 25 (H) 02/18/2022   CO2 20 (L) 02/18/2022   INR 1.1 02/17/2022   HGBA1C 5.5 11/18/2018     BNP (last 3 results) No results for input(s): "BNP" in the last 8760 hours.  ProBNP (last 3 results) No results for input(s): "PROBNP" in the last 8760 hours.   Other Studies Reviewed Today:  CORONARY STENT INTERVENTION 03/2018  LEFT HEART CATH AND CORS/GRAFTS ANGIOGRAPHY  Conclusion      Mid RCA to Dist RCA lesion is 100% stenosed. Ost LAD to Prox LAD lesion is 100% stenosed. Prox RCA to Mid RCA lesion is 75% stenosed. Prox Cx to Mid Cx lesion is 95% stenosed. Origin lesion is 100% stenosed. A stent was successfully placed. Post intervention, there is a 0% residual stenosis. LV end diastolic pressure is mildly elevated. The left ventricular ejection fraction is 50-55% by visual estimate. The left ventricular systolic function is normal.            Assessment/Plan:  1. CAD - has had multiple caths/PCI/CABG and last was PCI in June of 2019 -  to occluded SVG to LCX/OM branch - had high grade OM branch ISR of the old stent - on lifelong DAPT.  Myovue 08/03/20 EF 67% normal no ischemia  seen in ED 05/01/21 with chest pain R/O New nitro called in Observe   2. HTN - BP is ok - would continue on current regimen.   3. HLD - continue Parluent and low dose crestor labs today   4. Tobacco abuse - with emphysema  - he is not ready to stop yet. Update lung cancer screening CT  5. PVD:  f/u Bernardo Heater post repair of left femoral pseudoaneurysm improved ABI's 02/20/22   Current medicines are reviewed with the patient today.  The patient does not have concerns regarding medicines other than what has been noted above.  The following changes have been made:  See above.  Lung cancer CT Lipid and Liver    Disposition:   f/u 6 months    Patient is agreeable to this plan and will call if any problems develop in the interim.   Signed: Jenkins Rouge, MD  05/08/2022 1:47 PM  [facility] [address] [place], [place]  [address] Phone: [phone] Fax: [phone]

## 2022-05-15 ENCOUNTER — Ambulatory Visit: Payer: BC Managed Care – PPO | Admitting: Cardiovascular Disease

## 2022-05-15 ENCOUNTER — Encounter: Payer: Self-pay | Admitting: Cardiovascular Disease

## 2022-05-15 ENCOUNTER — Other Ambulatory Visit: Payer: BC Managed Care – PPO

## 2022-05-15 VITALS — BP 158/66 | HR 71 | Ht 69.0 in | Wt 210.0 lb

## 2022-05-15 DIAGNOSIS — I2510 Atherosclerotic heart disease of native coronary artery without angina pectoris: Secondary | ICD-10-CM | POA: Diagnosis not present

## 2022-05-15 DIAGNOSIS — Z87891 Personal history of nicotine dependence: Secondary | ICD-10-CM | POA: Diagnosis not present

## 2022-05-15 DIAGNOSIS — I739 Peripheral vascular disease, unspecified: Secondary | ICD-10-CM

## 2022-05-15 DIAGNOSIS — I10 Essential (primary) hypertension: Secondary | ICD-10-CM | POA: Diagnosis not present

## 2022-05-15 DIAGNOSIS — I2584 Coronary atherosclerosis due to calcified coronary lesion: Secondary | ICD-10-CM

## 2022-05-15 LAB — HEPATIC FUNCTION PANEL
ALT: 19 IU/L (ref 0–44)
AST: 23 IU/L (ref 0–40)
Albumin: 4.6 g/dL (ref 3.9–4.9)
Alkaline Phosphatase: 55 IU/L (ref 44–121)
Bilirubin Total: 0.4 mg/dL (ref 0.0–1.2)
Bilirubin, Direct: 0.13 mg/dL (ref 0.00–0.40)
Total Protein: 6.9 g/dL (ref 6.0–8.5)

## 2022-05-15 LAB — LIPID PANEL
Chol/HDL Ratio: 2.7 ratio (ref 0.0–5.0)
Cholesterol, Total: 113 mg/dL (ref 100–199)
HDL: 42 mg/dL (ref >39)
LDL Chol Calc (NIH): 46 mg/dL (ref 0–99)
Triglycerides: 143 mg/dL (ref 0–149)
VLDL Cholesterol Cal: 25 mg/dL (ref 5–40)

## 2022-05-15 NOTE — Addendum Note (Signed)
Addended by: Aris Georgia, Brittany Osier L on: 05/15/2022 08:36 AM   Modules accepted: Orders

## 2022-05-15 NOTE — Patient Instructions (Signed)
Medication Instructions:  Your physician recommends that you continue on your current medications as directed. Please refer to the Current Medication list given to you today.  *If you need a refill on your cardiac medications before your next appointment, please call your pharmacy*  Lab Work: Your physician recommends that you have lab work today- Lipid and Liver Panel  If you have labs (blood work) drawn today and your tests are completely normal, you will receive your results only by: MyChart Message (if you have MyChart) OR A paper copy in the mail If you have any lab test that is abnormal or we need to change your treatment, we will call you to review the results.  Testing/Procedures: Non-Cardiac CT scanning for lung cancer screening, (CAT scanning), is a noninvasive, special x-ray that produces cross-sectional images of the body using x-rays and a computer. CT scans help physicians diagnose and treat medical conditions. For some CT exams, a contrast material is used to enhance visibility in the area of the body being studied. CT scans provide greater clarity and reveal more details than regular x-ray exams.  Follow-Up: At [facility], you and your health needs are our priority.  As part of our continuing mission to provide you with exceptional heart care, we have created designated Provider Care Teams.  These Care Teams include your primary Cardiologist (physician) and Advanced Practice Providers (APPs -  Physician Assistants and Nurse Practitioners) who all work together to provide you with the care you need, when you need it.  We recommend signing up for the patient portal called "MyChart".  Sign up information is provided on this After Visit Summary.  MyChart is used to connect with patients for Virtual Visits (Telemedicine).  Patients are able to view lab/test results, encounter notes, upcoming appointments, etc.  Non-urgent messages can be sent to your provider as well.   To learn  more about what you can do with MyChart, go to [email or web address].    Your next appointment:   6 month(s)  The format for your next appointment:   In Person  Provider:   Jenkins Rouge, MD {    Important Information About Sugar

## 2022-05-21 ENCOUNTER — Telehealth: Payer: Self-pay | Admitting: Pharmacist

## 2022-05-21 DIAGNOSIS — E781 Pure hyperglyceridemia: Secondary | ICD-10-CM

## 2022-05-21 DIAGNOSIS — E782 Mixed hyperlipidemia: Secondary | ICD-10-CM

## 2022-05-21 DIAGNOSIS — I2510 Atherosclerotic heart disease of native coronary artery without angina pectoris: Secondary | ICD-10-CM

## 2022-05-21 DIAGNOSIS — I739 Peripheral vascular disease, unspecified: Secondary | ICD-10-CM

## 2022-05-21 NOTE — Telephone Encounter (Signed)
Patient's wife called and reports Praluent is no longer covered.  Will submit PA for Repatha.  Key: [id number].  Repatha PA approved

## 2022-05-22 ENCOUNTER — Ambulatory Visit
Admission: RE | Admit: 2022-05-22 | Discharge: 2022-05-22 | Disposition: A | Discharge status: Home / Self Care | Payer: BC Managed Care – PPO | Source: Ambulatory Visit | Attending: Cardiovascular Disease | Admitting: Cardiovascular Disease

## 2022-05-22 DIAGNOSIS — Z87891 Personal history of nicotine dependence: Secondary | ICD-10-CM

## 2022-05-22 DIAGNOSIS — I2510 Atherosclerotic heart disease of native coronary artery without angina pectoris: Secondary | ICD-10-CM

## 2022-05-23 ENCOUNTER — Telehealth: Payer: Self-pay | Admitting: Cardiovascular Disease

## 2022-05-23 NOTE — Telephone Encounter (Signed)
Pt is returning call for results. Transferred to Pershing Proud, RN.

## 2022-05-23 NOTE — Telephone Encounter (Signed)
The patient has been notified of the result and verbalized understanding.  All questions (if any) were answered. Michaelyn Barter, RN 05/23/2022 4:23 PM

## 2022-05-26 MED ORDER — REPATHA SURECLICK 140 MG/ML ~~LOC~~ SOAJ: 1.0000 mL | SUBCUTANEOUS | 3 refills | Status: DC

## 2022-05-27 ENCOUNTER — Telehealth: Payer: Self-pay | Admitting: *Deleted

## 2022-05-27 NOTE — Telephone Encounter (Signed)
CORRECTION: FAX # IS [id number] PROCEDURE: 1 TOOTH TO BE EXTRACTED OR MAY TRY AND SAVE TOOTH BY DOING A ROOT CANAL

## 2022-05-27 NOTE — Telephone Encounter (Signed)
Per pre op provider today, Ambrose Pancoast, NP this clearance is for either tooth extraction or if pt decides to have root canal.

## 2022-05-27 NOTE — Telephone Encounter (Signed)
   Patient Name: [name]  DOB: October 19, 1956 MRN: [id number]  Primary Cardiologist: Jenkins Rouge, MD  Chart reviewed as part of pre-operative protocol coverage.   IF SIMPLE EXTRACTION/CLEANINGS: Simple dental extractions (i.e. 1-2 teeth) are considered low risk procedures per guidelines and generally do not require any specific cardiac clearance. It is also generally accepted that for simple extractions and dental cleanings, there is no need to interrupt blood thinner therapy.   SBE prophylaxis is not required for the patient from a cardiac standpoint.  I will route this recommendation to the requesting party via Epic fax function and remove from pre-op pool.  Please call with questions.  Mable Fill, Marissa Nestle, NP 05/27/2022, 11:41 AM

## 2022-05-27 NOTE — Telephone Encounter (Signed)
   Pre-operative Risk Assessment    Patient Name: [name]  DOB: 12-20-1955 MRN: [id number]     Request for Surgical Clearance    Procedure:   1 TOOTH TO BE EXTRACTED  Date of Surgery:  Clearance 05/27/22     PT IS THERE NOW                            Surgeon:  DR. Payton Spark, DDS Surgeon's Group or Practice Name:   Phone number:  [phone] Fax number:  [phone]   Type of Clearance Requested:   - Medical  - Pharmacy:  Hold Aspirin and Clopidogrel (Plavix)     Type of Anesthesia:  Not Indicated   Additional requests/questions:    Jiles Prows   05/27/2022, 11:35 AM

## 2022-06-09 ENCOUNTER — Telehealth: Payer: Self-pay | Admitting: Internal Medicine

## 2022-06-09 DIAGNOSIS — G4733 Obstructive sleep apnea (adult) (pediatric): Secondary | ICD-10-CM

## 2022-06-09 NOTE — Telephone Encounter (Signed)
Called and spoke to patient and informed him that I was sending in the order for a replacement machine for his cpap. I advised patient that once he gets his new cpap he needs to be seen 31-90 for a compliance visit with Dr Annamaria Boots for insurance.   Patient verbalized understanding. Nothing further needed

## 2022-06-09 NOTE — Telephone Encounter (Signed)
Please order replacement for old CPAP machine auto 5-15, mask of choice, humidifier, supplies, Airview/ card

## 2022-06-09 NOTE — Telephone Encounter (Signed)
LOV: 08/2021  Called patient and he states that is wanting a new rx for a cpap machine. He states he has meet his deductible through his insurance.   Was last seen by Dr Annamaria Boots in 2022.   Sir are you ok with new cpap order, if so can you give me the settings for a new cpap machine.  Please advise sir

## 2022-06-10 ENCOUNTER — Encounter: Payer: Self-pay | Admitting: Family Medicine

## 2022-06-10 ENCOUNTER — Other Ambulatory Visit: Payer: Self-pay | Admitting: Family Medicine

## 2022-06-10 MED ORDER — AMOXICILLIN-POT CLAVULANATE 875-125 MG PO TABS: 1.0000 | ORAL_TABLET | Freq: Two times a day (BID) | ORAL | 0 refills | Status: DC

## 2022-06-20 ENCOUNTER — Other Ambulatory Visit: Payer: Self-pay | Admitting: Cardiovascular Disease

## 2022-06-20 DIAGNOSIS — I2510 Atherosclerotic heart disease of native coronary artery without angina pectoris: Secondary | ICD-10-CM

## 2022-06-20 DIAGNOSIS — E782 Mixed hyperlipidemia: Secondary | ICD-10-CM

## 2022-06-20 DIAGNOSIS — I10 Essential (primary) hypertension: Secondary | ICD-10-CM

## 2022-06-20 DIAGNOSIS — N183 Chronic kidney disease, stage 3 unspecified: Secondary | ICD-10-CM

## 2022-07-23 ENCOUNTER — Other Ambulatory Visit: Payer: Self-pay | Admitting: Cardiovascular Disease

## 2022-07-23 DIAGNOSIS — I10 Essential (primary) hypertension: Secondary | ICD-10-CM

## 2022-07-23 DIAGNOSIS — N183 Chronic kidney disease, stage 3 unspecified: Secondary | ICD-10-CM

## 2022-07-23 DIAGNOSIS — E782 Mixed hyperlipidemia: Secondary | ICD-10-CM

## 2022-07-23 DIAGNOSIS — I2510 Atherosclerotic heart disease of native coronary artery without angina pectoris: Secondary | ICD-10-CM

## 2022-08-07 ENCOUNTER — Encounter: Payer: Self-pay | Admitting: Podiatry

## 2022-08-07 ENCOUNTER — Ambulatory Visit: Payer: BC Managed Care – PPO | Admitting: Podiatry

## 2022-08-07 DIAGNOSIS — L600 Ingrowing nail: Secondary | ICD-10-CM

## 2022-08-07 DIAGNOSIS — I999 Unspecified disorder of circulatory system: Secondary | ICD-10-CM

## 2022-08-10 NOTE — Progress Notes (Signed)
Subjective:   Patient ID: [name], male   DOB: 66 y.o.   MRN: [id number]   HPI Patient presents stating that his nails are still very ingrown and he is hoping we can do something for him and patient was treated for his vascular disease they have been able to make some improvement but he still does have moderate circulatory issues.  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  At this point I did go ahead and I discussed just trying to give him relief by just removing some nails from the corners without doing any kind of permanent procedure or high risk procedure for healing.  He would like to have a more high risk procedure but I am refusing to do it given his health history and cigarette smoking so today I went ahead I anesthetized each hallux using sterile instrumentation remove the medial lateral borders removed some proud flesh applied sterile dressing reappoint to recheck if any issues with healing were to occur he is to reappoint in the

## 2022-08-22 ENCOUNTER — Telehealth: Payer: Self-pay

## 2022-08-22 NOTE — Telephone Encounter (Signed)
Pt called requesting an appt d/t some leg soreness.  Reviewed pt's chart, returned call for clarification, two identifiers used. Pt stated that he had some soreness on his L leg and wanted to be seen to ensure there was nothing wrong. He denies any acute pain, discoloration, or swelling. He states that it is intermittently sore. Appt scheduled from recall. Confirmed understanding.

## 2022-08-25 ENCOUNTER — Ambulatory Visit (INDEPENDENT_AMBULATORY_CARE_PROVIDER_SITE_OTHER): Payer: BC Managed Care – PPO | Admitting: Physician Assistant

## 2022-08-25 VITALS — BP 162/81 | HR 75 | Temp 98.2°F | Resp 20 | Ht 69.0 in | Wt 211.0 lb

## 2022-08-25 DIAGNOSIS — I739 Peripheral vascular disease, unspecified: Secondary | ICD-10-CM | POA: Diagnosis not present

## 2022-08-25 MED ORDER — HYDROMORPHONE HCL 2 MG PO TABS: 2.0000 mg | ORAL_TABLET | ORAL | 0 refills | Status: DC | PRN

## 2022-08-25 NOTE — Progress Notes (Signed)
[facility] [place] HISTORY AND PHYSICAL   History of Present Illness:  Patient is a 66 y.o. year old male who presents for evaluation of new sever pain  left LE pain that interferes with sleep and ambulation.   He states the pain woke him up from sleep a week ago and he has not had relief of pain since.  He tried pain tablets with minimal relief.    He denies non healing wounds.  He was last seen 03/11/22.  He was in stable condition with monophasic ABI's  on the left and improved index.  He was referred by Dr. Gwenlyn Found after an angiogram caused a pseudoaneurysm.  Dr. Rosalene Billings on the perform left LE thrombectomy and Left common femoral endarterectomy with bovine pericardial patch angioplasty.      Past Medical History:  Diagnosis Date   Cervical spine ankylosis    CKD (chronic kidney disease) stage 3, GFR 30-59 ml/min (HCC)    COLONIC POLYPS, HX OF    CORONARY ARTERY DISEASE    a. s/p stent to RCA 2005. b. Stent to Cx 2007. c.  CABG X4 2014; d. LHC 04/19/18 occluded SVG-Circ, DES to in-stent restenosis of circ.    COVID-19 2020   Diverticulosis    DIVERTICULOSIS, COLON    DYSPHAGIA UNSPECIFIED    intermittent   GERD    HEMORRHOIDS    HYPERLIPIDEMIA    HYPERTENSION    HYPERTRIGLYCERIDEMIA    Left carotid artery occlusion    OBESITY    Postoperative atrial fibrillation ([place]) 2014   Pseudoaneurysm of left femoral artery ([place]) 02/16/2022   SLEEP APNEA    on CPAP   Statin intolerance    Tobacco abuse    Vertebral artery occlusion, left     Past Surgical History:  Procedure Laterality Date   ABDOMINAL AORTOGRAM W/LOWER EXTREMITY N/A 02/13/2022   Procedure: ABDOMINAL AORTOGRAM W/LOWER EXTREMITY;  Surgeon: Lorretta Harp, MD;  Location: [facility];  Service: Cardiovascular;  Laterality: N/A;   ACHILLES TENDON REPAIR     BACK SURGERY     lower back   CARDIAC CATHETERIZATION     CORONARY ARTERY BYPASS GRAFT  08/18/2012   Procedure: CORONARY ARTERY  BYPASS GRAFTING (CABG);  Surgeon: Gaye Pollack, MD;  Location: [facility];  Service: Open Heart Surgery;  Laterality: N/A;  CABG x four;  using left internal mammary artery and right leg greater saphenous vein harvested endoscopically   CORONARY STENT INTERVENTION N/A 04/19/2018   Procedure: CORONARY STENT INTERVENTION;  Surgeon: Lorretta Harp, MD;  Location: [facility];  Service: Cardiovascular;  Laterality: N/A;   ENDARTERECTOMY FEMORAL Left 02/17/2022   Procedure: ENDARTERECTOMY FEMORAL;  Surgeon: Marty Heck, MD;  Location: [facility];  Service: Vascular;  Laterality: Left;   INTRAOPERATIVE ARTERIOGRAM Left 02/17/2022   Procedure: INTRA OPERATIVE ARTERIOGRAM;  Surgeon: Marty Heck, MD;  Location: [place] [facility];  Service: Vascular;  Laterality: Left;   KNEE ARTHROSCOPY     RIGHT   LEFT HEART CATH AND CORS/GRAFTS ANGIOGRAPHY N/A 04/19/2018   Procedure: LEFT HEART CATH AND CORS/GRAFTS ANGIOGRAPHY;  Surgeon: Lorretta Harp, MD;  Location: [facility];  Service: Cardiovascular;  Laterality: N/A;   LUMBAR LAMINECTOMY/DECOMPRESSION MICRODISCECTOMY N/A 12/05/2015   Procedure: LUMBAR 2-3, LUMBAR 3-4 DECOMPRESSION ;  Surgeon: Phylliss Bob, MD;  Location: [facility];  Service: Orthopedics;  Laterality: N/A;   PATCH ANGIOPLASTY Left 02/17/2022   Procedure: PATCH ANGIOPLASTY Left Common Femoral  Artery.;  Surgeon: Marty Heck, MD;  Location: [facility];  Service: Vascular;  Laterality: Left;   PERIPHERAL VASCULAR ATHERECTOMY  02/13/2022   Procedure: PERIPHERAL VASCULAR ATHERECTOMY;  Surgeon: Lorretta Harp, MD;  Location: [facility];  Service: Cardiovascular;;  Rt SFA   Right long finger extensor tendon repair     THROMBECTOMY FEMORAL ARTERY Left 02/17/2022   Procedure: Repair of Left COMMON FEMORAL PSEUDOANEURYSM.;  Surgeon: Marty Heck, MD;  Location: [facility];  Service: Vascular;  Laterality: Left;    ROS:   General:  No weight loss, Fever, chills  HEENT: No recent  headaches, no nasal bleeding, no visual changes, no sore throat  Neurologic: No dizziness, blackouts, seizures. No recent symptoms of stroke or mini- stroke. No recent episodes of slurred speech, or temporary blindness.  Cardiac: No recent episodes of chest pain/pressure, no shortness of breath at rest.  No shortness of breath with exertion.  Denies history of atrial fibrillation or irregular heartbeat  Vascular: No history of rest pain in feet.  No history of claudication.  No history of non-healing ulcer, No history of DVT   Pulmonary: No home oxygen, no productive cough, no hemoptysis,  No asthma or wheezing  Musculoskeletal:  '[ ]'$  Arthritis, '[ ]'$  Low back pain,  '[ ]'$  Joint pain  Hematologic:No history of hypercoagulable state.  No history of easy bleeding.  No history of anemia  Gastrointestinal: No hematochezia or melena,  No gastroesophageal reflux, no trouble swallowing  Urinary: '[ ]'$  chronic Kidney disease, '[ ]'$  on HD - '[ ]'$  MWF or '[ ]'$  TTHS, '[ ]'$  Burning with urination, '[ ]'$  Frequent urination, '[ ]'$  Difficulty urinating;   Skin: No rashes  Psychological: No history of anxiety,  No history of depression  Social History Social History   Tobacco Use   Smoking status: Every Day    Packs/day: 1.50    Years: 43.00    Total pack years: 64.50    Types: Cigarettes    Passive exposure: Current (Wife)   Smokeless tobacco: Never   Tobacco comments:    Patient attempting to quit pt down to 1 ppd  Vaping Use   Vaping Use: Never used  Substance Use Topics   Alcohol use: Yes    Comment: 1-2 drink occasionally   Drug use: No    Family History Family History  Problem Relation Age of Onset   Cancer Mother    Heart disease Brother     Allergies  Allergies  Allergen Reactions   Statins Other (See Comments)    Bone and muscle pain   Crestor [Rosuvastatin]     myalgias   Lipitor [Atorvastatin]     myalgias   Oxycodone Other (See Comments)   Hydrocodone Nausea And Vomiting      Current Outpatient Medications  Medication Sig Dispense Refill   acetaminophen (TYLENOL) 500 MG tablet Take 1,000 mg by mouth every 6 (six) hours as needed for mild pain.     amoxicillin-clavulanate (AUGMENTIN) 875-125 MG tablet Take 1 tablet by mouth 2 (two) times daily. 20 tablet 0   aspirin EC 81 MG tablet Take 1 tablet (81 mg total) by mouth daily. 90 tablet 3   carboxymethylcellulose 1 % ophthalmic solution Place 1 drop into both eyes daily as needed (dry eyes).     clopidogrel (PLAVIX) 75 MG tablet Take 1 tablet (75 mg total) by mouth daily. 90 tablet 3   diphenoxylate-atropine (LOMOTIL) 2.5-0.025 MG tablet Take 2 tablets by  mouth 4 (four) times daily as needed for diarrhea or loose stools. 30 tablet 0   Evolocumab (REPATHA SURECLICK) 818 MG/ML SOAJ Inject 1 mL into the skin every 14 (fourteen) days. 6 mL 3   HYDROmorphone (DILAUDID) 2 MG tablet Take 1 tablet (2 mg total) by mouth every 4 (four) hours as needed for severe pain. 10 tablet 0   losartan-hydrochlorothiazide (HYZAAR) 100-12.5 MG tablet TAKE 1 TABLET BY MOUTH ONCE A DAY 90 tablet 2   nitroGLYCERIN (NITROSTAT) 0.4 MG SL tablet Place 1 tablet (0.4 mg total) under the tongue every 5 (five) minutes as needed for chest pain. 25 tablet 2   pantoprazole (PROTONIX) 40 MG tablet TAKE 1 TABLET BY MOUTH ONCE A DAY 90 tablet 0   Probiotic Product (PROBIOTIC-10 ULTIMATE) CAPS Take 1 capsule by mouth daily.     No current facility-administered medications for this visit.    Physical Examination  Vitals:   08/25/22 1426  BP: (!) 162/81  Pulse: 75  Resp: 20  Temp: 98.2 F (36.8 C)  TempSrc: Temporal  SpO2: 98%  Weight: 211 lb (95.7 kg)  Height: '5\' 9"'$  (1.753 m)    Body mass index is 31.16 kg/m.  General:  Alert and oriented, no acute distress HEENT: Normal Neck: No bruit or JVD Pulmonary: Clear to auscultation bilaterally Cardiac: Regular Rate and Rhythm without murmur Abdomen: Soft, non-tender, non-distended, no  mass, no scars Skin: No rash, no open wounds Extremity Pulses: 2+ femoral, doppler DP/PT monophasic left LE  Musculoskeletal: No deformity or edema  Neurologic: Upper and lower extremity motor grossly and symmetric     ASSESSMENT/PLAN:  PAD s/p pseudoaneurysmal repair New rest pain left LE.  I will schedule him for angiogram with left LE runoff and possible intervention.  I sent dilaudid PO 2 mg #10 for pain control.. he has allergy to other pain medications in the past.  He will continue ASA and Plavix.    Of note he has a history of lumbar disc herniation Left common femoral endarterectomy with bovine pericardial patch angioplasty and required discectomy in the past with radicular pain.      Roxy Horseman PA-C [facility] [place] Office: [phone]  MD in clinic [place]

## 2022-08-26 ENCOUNTER — Other Ambulatory Visit: Payer: Self-pay

## 2022-08-26 DIAGNOSIS — I739 Peripheral vascular disease, unspecified: Secondary | ICD-10-CM

## 2022-08-28 ENCOUNTER — Other Ambulatory Visit: Payer: Self-pay

## 2022-08-28 ENCOUNTER — Ambulatory Visit (HOSPITAL_COMMUNITY)
Admission: RE | Admit: 2022-08-28 | Discharge: 2022-08-28 | Disposition: A | Discharge status: Home / Self Care | Payer: BC Managed Care – PPO | Attending: Vascular Surgery | Admitting: Vascular Surgery

## 2022-08-28 ENCOUNTER — Ambulatory Visit (HOSPITAL_COMMUNITY)
Admission: RE | Disposition: A | Discharge status: Home / Self Care | Payer: Self-pay | Source: Home / Self Care | Attending: Vascular Surgery

## 2022-08-28 DIAGNOSIS — I70222 Atherosclerosis of native arteries of extremities with rest pain, left leg: Secondary | ICD-10-CM | POA: Insufficient documentation

## 2022-08-28 DIAGNOSIS — Z7902 Long term (current) use of antithrombotics/antiplatelets: Secondary | ICD-10-CM | POA: Diagnosis not present

## 2022-08-28 DIAGNOSIS — Z7982 Long term (current) use of aspirin: Secondary | ICD-10-CM | POA: Insufficient documentation

## 2022-08-28 DIAGNOSIS — I739 Peripheral vascular disease, unspecified: Secondary | ICD-10-CM

## 2022-08-28 DIAGNOSIS — F17210 Nicotine dependence, cigarettes, uncomplicated: Secondary | ICD-10-CM | POA: Insufficient documentation

## 2022-08-28 HISTORY — PX: ABDOMINAL AORTOGRAM W/LOWER EXTREMITY: CATH118223

## 2022-08-28 HISTORY — PX: PERIPHERAL VASCULAR INTERVENTION: CATH118257

## 2022-08-28 LAB — POCT I-STAT, CHEM 8
BUN: 26 mg/dL — ABNORMAL HIGH (ref 8–23)
Calcium, Ion: 1.2 mmol/L (ref 1.15–1.40)
Chloride: 106 mmol/L (ref 98–111)
Creatinine, Ser: 1.5 mg/dL — ABNORMAL HIGH (ref 0.61–1.24)
Glucose, Bld: 95 mg/dL (ref 70–99)
HCT: 46 % (ref 39.0–52.0)
Hemoglobin: 15.6 g/dL (ref 13.0–17.0)
Potassium: 4.2 mmol/L (ref 3.5–5.1)
Sodium: 138 mmol/L (ref 135–145)
TCO2: 23 mmol/L (ref 22–32)

## 2022-08-28 LAB — POCT ACTIVATED CLOTTING TIME
Activated Clotting Time: 263 seconds
Activated Clotting Time: 143 seconds

## 2022-08-28 SURGERY — ABDOMINAL AORTOGRAM W/LOWER EXTREMITY
Anesthesia: LOCAL

## 2022-08-28 MED ORDER — HEPARIN SODIUM (PORCINE) 1000 UNIT/ML IJ SOLN
INTRAMUSCULAR | Status: DC | PRN
  Administered 2022-08-28: 9000 [IU] via INTRAVENOUS
  Administered 2022-08-28: 2000 [IU] via INTRAVENOUS

## 2022-08-28 MED ORDER — SODIUM CHLORIDE 0.9 % IV SOLN: 250.0000 mL | INTRAVENOUS | Status: DC | PRN

## 2022-08-28 MED ORDER — FENTANYL CITRATE (PF) 100 MCG/2ML IJ SOLN
INTRAMUSCULAR | Status: AC
  Filled 2022-08-28: qty 2

## 2022-08-28 MED ORDER — ACETAMINOPHEN 325 MG PO TABS: 650.0000 mg | ORAL_TABLET | ORAL | Status: DC | PRN

## 2022-08-28 MED ORDER — HEPARIN SODIUM (PORCINE) 1000 UNIT/ML IJ SOLN
INTRAMUSCULAR | Status: AC
  Filled 2022-08-28: qty 10

## 2022-08-28 MED ORDER — FENTANYL CITRATE (PF) 100 MCG/2ML IJ SOLN
INTRAMUSCULAR | Status: DC | PRN
  Administered 2022-08-28: 25 ug via INTRAVENOUS
  Administered 2022-08-28: 50 ug via INTRAVENOUS
  Administered 2022-08-28: 25 ug via INTRAVENOUS

## 2022-08-28 MED ORDER — LIDOCAINE HCL (PF) 1 % IJ SOLN
INTRAMUSCULAR | Status: DC | PRN
  Administered 2022-08-28: 15 mL

## 2022-08-28 MED ORDER — NITROGLYCERIN 1 MG/10 ML FOR IR/CATH LAB
INTRA_ARTERIAL | Status: AC
  Filled 2022-08-28: qty 10

## 2022-08-28 MED ORDER — SODIUM CHLORIDE 0.9% FLUSH: 3.0000 mL | Freq: Two times a day (BID) | INTRAVENOUS | Status: DC

## 2022-08-28 MED ORDER — LABETALOL HCL 5 MG/ML IV SOLN: 10.0000 mg | INTRAVENOUS | Status: DC | PRN

## 2022-08-28 MED ORDER — HYDRALAZINE HCL 20 MG/ML IJ SOLN
INTRAMUSCULAR | Status: AC
  Filled 2022-08-28: qty 1

## 2022-08-28 MED ORDER — MIDAZOLAM HCL 2 MG/2ML IJ SOLN
INTRAMUSCULAR | Status: AC
  Filled 2022-08-28: qty 2

## 2022-08-28 MED ORDER — HEPARIN (PORCINE) IN NACL 1000-0.9 UT/500ML-% IV SOLN
INTRAVENOUS | Status: DC | PRN
  Administered 2022-08-28 (×2): 500 mL

## 2022-08-28 MED ORDER — ASPIRIN 81 MG PO CHEW
CHEWABLE_TABLET | ORAL | Status: AC
  Filled 2022-08-28: qty 1

## 2022-08-28 MED ORDER — SODIUM CHLORIDE 0.9 % IV SOLN: INTRAVENOUS | Status: DC

## 2022-08-28 MED ORDER — CLOPIDOGREL BISULFATE 300 MG PO TABS
ORAL_TABLET | ORAL | Status: AC
  Filled 2022-08-28: qty 1

## 2022-08-28 MED ORDER — PROTAMINE SULFATE 10 MG/ML IV SOLN
INTRAVENOUS | Status: AC
  Filled 2022-08-28: qty 5

## 2022-08-28 MED ORDER — LIDOCAINE HCL (PF) 1 % IJ SOLN
INTRAMUSCULAR | Status: AC
  Filled 2022-08-28: qty 30

## 2022-08-28 MED ORDER — PROTAMINE SULFATE 10 MG/ML IV SOLN
20.0000 mg | Freq: Once | INTRAVENOUS | Status: AC
  Administered 2022-08-28: 20 mg via INTRAVENOUS

## 2022-08-28 MED ORDER — NITROGLYCERIN 1 MG/10 ML FOR IR/CATH LAB
INTRA_ARTERIAL | Status: DC | PRN
  Administered 2022-08-28: 200 ug via INTRA_ARTERIAL

## 2022-08-28 MED ORDER — MIDAZOLAM HCL 2 MG/2ML IJ SOLN
INTRAMUSCULAR | Status: DC | PRN
  Administered 2022-08-28 (×2): 1 mg via INTRAVENOUS

## 2022-08-28 MED ORDER — ONDANSETRON HCL 4 MG/2ML IJ SOLN: 4.0000 mg | Freq: Four times a day (QID) | INTRAMUSCULAR | Status: DC | PRN

## 2022-08-28 MED ORDER — SODIUM CHLORIDE 0.9 % IV SOLN: INTRAVENOUS | Status: AC

## 2022-08-28 MED ORDER — ASPIRIN 81 MG PO CHEW
CHEWABLE_TABLET | ORAL | Status: DC | PRN
  Administered 2022-08-28: 81 mg via ORAL

## 2022-08-28 MED ORDER — HEPARIN (PORCINE) IN NACL 1000-0.9 UT/500ML-% IV SOLN
INTRAVENOUS | Status: AC
  Filled 2022-08-28: qty 1000

## 2022-08-28 MED ORDER — IODIXANOL 320 MG/ML IV SOLN
INTRAVENOUS | Status: DC | PRN
  Administered 2022-08-28: 95 mL

## 2022-08-28 MED ORDER — SODIUM CHLORIDE 0.9% FLUSH: 3.0000 mL | INTRAVENOUS | Status: DC | PRN

## 2022-08-28 MED ORDER — HYDRALAZINE HCL 20 MG/ML IJ SOLN
5.0000 mg | INTRAMUSCULAR | Status: DC | PRN
  Administered 2022-08-28: 5 mg via INTRAVENOUS

## 2022-08-28 SURGICAL SUPPLY — 32 items
BALLN MUSTANG 5X150X135 (BALLOONS) ×2
BALLN MUSTANG 5X80X135 (BALLOONS) ×2
BALLN STERLING OTW 4X40X135 (BALLOONS) ×2
BALLOON MUSTANG 5X150X135 (BALLOONS) IMPLANT
BALLOON MUSTANG 5X80X135 (BALLOONS) IMPLANT
BALLOON STERLING OTW 4X40X135 (BALLOONS) IMPLANT
CATH CROSS OVER TEMPO 5F (CATHETERS) IMPLANT
CATH MUSTANG 4X200X135 (BALLOONS) IMPLANT
CATH OMNI FLUSH 5F 65CM (CATHETERS) IMPLANT
CATH QUICKCROSS .035X135CM (MICROCATHETER) IMPLANT
CATH QUICKCROSS SUPP .035X90CM (MICROCATHETER) IMPLANT
DEVICE CLOSURE MYNXGRIP 6/7F (Vascular Products) IMPLANT
DEVICE SPIDERFX EMB PROT 4MM (WIRE) IMPLANT
DEVICE TORQUE .025-.038 (MISCELLANEOUS) IMPLANT
GLIDEWIRE ADV .035X260CM (WIRE) IMPLANT
GUIDEWIRE ANGLED .035X150CM (WIRE) IMPLANT
GUIDEWIRE ANGLED .035X260CM (WIRE) IMPLANT
KIT ANGIASSIST CO2 SYSTEM (KITS) IMPLANT
KIT ENCORE 26 ADVANTAGE (KITS) IMPLANT
KIT MICROPUNCTURE NIT STIFF (SHEATH) IMPLANT
KIT PV (KITS) ×2 IMPLANT
SHEATH CATAPULT 6FR 60 (SHEATH) IMPLANT
SHEATH PINNACLE 5F 10CM (SHEATH) IMPLANT
SHEATH PINNACLE 6F 10CM (SHEATH) IMPLANT
STENT ELUVIA 6X150X130 (Permanent Stent) IMPLANT
STENT ELUVIA 6X80X130 (Permanent Stent) IMPLANT
SYR MEDRAD MARK V 150ML (SYRINGE) IMPLANT
TRANSDUCER W/STOPCOCK (MISCELLANEOUS) ×2 IMPLANT
TRAY PV CATH (CUSTOM PROCEDURE TRAY) ×2 IMPLANT
WIRE BENTSON .035X145CM (WIRE) IMPLANT
WIRE G V18X300CM (WIRE) IMPLANT
WIRE ROSEN-J .035X260CM (WIRE) IMPLANT

## 2022-08-28 NOTE — Progress Notes (Signed)
SITE AREA: right groin/femoral  SITE PRIOR TO REMOVAL:  LEVEL 0  PRESSURE APPLIED FOR: approximately 20 minutes  MANUAL: yes  PATIENT STATUS DURING PULL: stable  POST PULL SITE:  LEVEL 0  POST PULL INSTRUCTIONS GIVEN: yes  POST PULL PULSES PRESENT: bilateral post tibial pulses dopplerable, R > than L  DRESSING APPLIED: gauze with tegaderm   BEDREST BEGINS @ 1627  COMMENTS:

## 2022-08-28 NOTE — Discharge Instructions (Signed)
Femoral Site Care This sheet gives you information about how to care for yourself after your procedure. Your health care provider may also give you more specific instructions. If you have problems or questions, contact your health care provider. What can I expect after the procedure?  After the procedure, it is common to have: Bruising that usually fades within 1-2 weeks. Tenderness at the site. Follow these instructions at home: Wound care Follow instructions from your health care provider about how to take care of your insertion site. Make sure you: Wash your hands with soap and water before you change your bandage (dressing). If soap and water are not available, use hand sanitizer. Remove your dressing as told by your health care provider. In 24 hours Do not take baths, swim, or use a hot tub until your health care provider approves. You may shower 24-48 hours after the procedure or as told by your health care provider. Gently wash the site with plain soap and water. Pat the area dry with a clean towel. Do not rub the site. This may cause bleeding. Do not apply powder or lotion to the site. Keep the site clean and dry. Check your femoral site every day for signs of infection. Check for: Redness, swelling, or pain. Fluid or blood. Warmth. Pus or a bad smell. Activity For the first 2-3 days after your procedure, or as long as directed: Avoid climbing stairs as much as possible. Do not squat. Do not lift anything that is heavier than 10 lb (4.5 kg), or the limit that you are told, until your health care provider says that it is safe. For 5 days Rest as directed. Avoid sitting for a long time without moving. Get up to take short walks every 1-2 hours. Do not drive for 24 hours if you were given a medicine to help you relax (sedative). General instructions Take over-the-counter and prescription medicines only as told by your health care provider. Keep all follow-up visits as told by  your health care provider. This is important. Contact a health care provider if you have: A fever or chills. You have redness, swelling, or pain around your insertion site. Get help right away if: The catheter insertion area swells very fast. You pass out. You suddenly start to sweat or your skin gets clammy. The catheter insertion area is bleeding, and the bleeding does not stop when you hold steady pressure on the area. The area near or just beyond the catheter insertion site becomes pale, cool, tingly, or numb. These symptoms may represent a serious problem that is an emergency. Do not wait to see if the symptoms will go away. Get medical help right away. Call your local emergency services (911 in the [place] Do not drive yourself to the hospital. Summary After the procedure, it is common to have bruising that usually fades within 1-2 weeks. Check your femoral site every day for signs of infection. Do not lift anything that is heavier than 10 lb (4.5 kg), or the limit that you are told, until your health care provider says that it is safe. This information is not intended to replace advice given to you by your health care provider. Make sure you discuss any questions you have with your health care provider. Document Revised: 10/26/2017 Document Reviewed: 10/26/2017 Elsevier Patient Education  2020 [employer] 

## 2022-08-28 NOTE — Op Note (Signed)
Patient name: [name] MRN: [id number] DOB: [birth year] Sex: male  08/28/2022 Pre-operative Diagnosis: Critical limb ischemia of the left lower extremity with rest pain Post-operative diagnosis:  Same Surgeon:  Marty Heck, MD Procedure Performed: 1.  Ultrasound-guided access right common femoral artery 2.  CO2 aortogram with catheter selection of aorta 3.  Left lower extremity arteriogram with selection of third order branches 4.  Left SFA and above-knee popliteal artery angioplasty with stent placement for chronic total occlusion (predilated with a 4 mm Mustang and stented with a 6 mm x 150 mm Eluvia x 2 and 6 mm x 80 mm Eluvia all postdilated with a 5 mm Mustang) 5.  Angioplasty of the left behind the knee popliteal artery (4 mm x 60 mm Mustang) 6.  109 minutes of monitored moderate conscious sedation time  Indications: 66 year old male who is known to vascular surgery having previously undergone left common femoral pseudoaneurysm repair involving endarterectomy of a high-grade stenosis with patch angioplasty on 02/17/22 after transfemoral access by Dr. Gwenlyn Found.  He was seen in the office for a triage visit with worsening rest pain in the left leg and was set up for arteriogram with me.  He presents today after risks benefits discussed.  Findings:   Aortogram with CO2 showed no flow-limiting stenosis in the aortoiliac segment.  Left lower extremity arteriogram showed a patent common femoral, profunda and proximal SFA at the site of previous endarterectomy with patch angioplasty for repair of femoral pseudoaneurysm.  The SFA was diffusely diseased with multifocal high-grade stenosis and had a long segment chronic total occlusion with reconstitution of an above-knee popliteal artery that had a tandem lesion above the knee joint that was also a high-grade calcified stenosis >80%.  Patient has three-vessel runoff although these are very small and diseased tibial vessels with disease  at the tibial trifurcation.  The left SFA and above-knee popliteal artery disease was crossed antegrade and we elected to stent this with a 6 mm x 150 mm Eluvia x2 and a 6 mm x 80 mm Eluvia and postdilated with a 5 mm Mustang.  Preserved three-vessel runoff distally after intervention.   Procedure:  The patient was identified in the holding area and taken to room 8.  The patient was then placed supine on the table and prepped and draped in the usual sterile fashion.  A time out was called.  Ultrasound was used to evaluate the right common femoral artery.  It was patent .  A digital ultrasound image was acquired.  A micropuncture needle was used to access the right common femoral artery under ultrasound guidance.  An 018 wire was advanced without resistance and a micropuncture sheath was placed.  The 018 wire was removed and a benson wire was placed.  The micropuncture sheath was exchanged for a 5 french sheath.  An omniflush catheter was advanced over the wire to the level of L-1.  An abdominal angiogram was obtained.  Next, using the omniflush catheter and a benson wire, the aortic bifurcation was crossed and the catheter was placed into theleft external iliac artery and left runoff was obtained.  Pertinent findings are noted above.  Ultimately elected to try and intervene on the left SFA above-knee popliteal chronic total occlusion.  Glidewire advantage was used into the proximal stump of the left SFA and we exchanged for a long 6 French sheath in the right groin over the aortic bifurcation.  Patient was given 100 units/kg IV heparin.  Then used  the Glidewire advantage with the quick cross to cross the left SFA chronic total occlusion.  Initially I was in a subintimal dissection.  Ultimately was able to redirect my wire get back in the true lumen of the above-knee popliteal artery and there was a second lesion just above the knee that we were able to cross.  I then did not feel that I could primarily stent  this without predilation given the calcific nature of the disease.  The SFA and above-knee popliteal artery was predilated with a long 4 mm Mustang.  We then stented this with a 6 mm x 150 mm Eluvia x2 and a 6 mm x 80 mm Eluvia from the above-knee popliteal artery just above knee joint all the way up to the SFA origin.  All this was then postdilated with a 5 mm Mustang.  There was some stenosis versus dissection behind the knee so I elected to treat this with a prolonged inflation at low atmosphere of a 4 mm Mustang with excellent results after we put a V18 wire down the posterior tibial.  Final images showed excellent flow down the SFA above-knee popliteal stents with filling into the diseased tibial vessels.  Wires and catheters were removed.  A short 6 French sheath was placed in the right common femoral artery.  I attempted to place a Mynx but this would not advance given tortuosity and diseased vessels so I elected to take him to holding for manual pressure.  Plan: Excellent results after stenting left SFA/above knee popliteal chronic total occlusion.  He does have significant tibial disease as well.  Discussed dual antiplatelet therapy and follow-up in the office in one month with non-invasive imaging.    Marty Heck, MD [facility] [place] Office: [phone]

## 2022-08-28 NOTE — H&P (Signed)
History and Physical Interval Note:  08/28/2022 11:27 AM  [name]  has presented today for surgery, with the diagnosis of pad.  The various methods of treatment have been discussed with the patient and family. After consideration of risks, benefits and other options for treatment, the patient has consented to  Procedure(s): ABDOMINAL AORTOGRAM W/LOWER EXTREMITY (N/A) as a surgical intervention.  The patient's history has been reviewed, patient examined, no change in status, stable for surgery.  I have reviewed the patient's chart and labs.  Questions were answered to the patient's satisfaction.     [facility] [place] HISTORY AND PHYSICAL    History of Present Illness:  Patient is a 66 y.o. year old male who presents for evaluation of new sever pain  left LE pain that interferes with sleep and ambulation.   He states the pain woke him up from sleep a week ago and he has not had relief of pain since.  He tried pain tablets with minimal relief.     He denies non healing wounds.  He was last seen 03/11/22.  He was in stable condition with monophasic ABI's  on the left and improved index.  He was referred by [name] after an angiogram caused a pseudoaneurysm.  [name] on the perform left LE thrombectomy and Left common femoral endarterectomy with bovine pericardial patch angioplasty.               Past Medical History:  Diagnosis Date   Cervical spine ankylosis     CKD (chronic kidney disease) stage 3, GFR 30-59 ml/min (HCC)     COLONIC POLYPS, HX OF     CORONARY ARTERY DISEASE      a. s/p stent to RCA 2005. b. Stent to Cx 2007. c.  CABG X4 2014; d. LHC 04/19/18 occluded SVG-Circ, DES to in-stent restenosis of circ.    COVID-19 2020   Diverticulosis     DIVERTICULOSIS, COLON     DYSPHAGIA UNSPECIFIED      intermittent   GERD     HEMORRHOIDS     HYPERLIPIDEMIA     HYPERTENSION     HYPERTRIGLYCERIDEMIA     Left carotid  artery occlusion     OBESITY     Postoperative atrial fibrillation ([place]) 2014   Pseudoaneurysm of left femoral artery ([place]) 02/16/2022   SLEEP APNEA      on CPAP   Statin intolerance     Tobacco abuse     Vertebral artery occlusion, left             Past Surgical History:  Procedure Laterality Date   ABDOMINAL AORTOGRAM W/LOWER EXTREMITY N/A 02/13/2022    Procedure: ABDOMINAL AORTOGRAM W/LOWER EXTREMITY;  Surgeon: Lorretta Harp, MD;  Location: [place] [facility];  Service: Cardiovascular;  Laterality: N/A;   ACHILLES TENDON REPAIR       BACK SURGERY        lower back   CARDIAC CATHETERIZATION       CORONARY ARTERY BYPASS GRAFT   08/18/2012    Procedure: CORONARY ARTERY BYPASS GRAFTING (CABG);  Surgeon: Gaye Pollack, MD;  Location: [place];  Service: Open Heart Surgery;  Laterality: N/A;  CABG x four;  using left internal mammary artery and right leg greater saphenous vein harvested endoscopically   CORONARY STENT INTERVENTION N/A 04/19/2018    Procedure: CORONARY STENT INTERVENTION;  Surgeon: Lorretta Harp, MD;  Location: [facility]  INVASIVE [facility];  Service: Cardiovascular;  Laterality: N/A;   ENDARTERECTOMY FEMORAL Left 02/17/2022    Procedure: ENDARTERECTOMY FEMORAL;  Surgeon: Marty Heck, MD;  Location: [place];  Service: Vascular;  Laterality: Left;   INTRAOPERATIVE ARTERIOGRAM Left 02/17/2022    Procedure: INTRA OPERATIVE ARTERIOGRAM;  Surgeon: Marty Heck, MD;  Location: [place];  Service: Vascular;  Laterality: Left;   KNEE ARTHROSCOPY        RIGHT   LEFT HEART CATH AND CORS/GRAFTS ANGIOGRAPHY N/A 04/19/2018    Procedure: LEFT HEART CATH AND CORS/GRAFTS ANGIOGRAPHY;  Surgeon: Lorretta Harp, MD;  Location: [place] [facility];  Service: Cardiovascular;  Laterality: N/A;   LUMBAR LAMINECTOMY/DECOMPRESSION MICRODISCECTOMY N/A 12/05/2015    Procedure: LUMBAR 2-3, LUMBAR 3-4 DECOMPRESSION ;  Surgeon: Phylliss Bob, MD;  Location: [place];  Service: Orthopedics;   Laterality: N/A;   PATCH ANGIOPLASTY Left 02/17/2022    Procedure: PATCH ANGIOPLASTY Left Common Femoral Artery.;  Surgeon: Marty Heck, MD;  Location: [place];  Service: Vascular;  Laterality: Left;   PERIPHERAL VASCULAR ATHERECTOMY   02/13/2022    Procedure: PERIPHERAL VASCULAR ATHERECTOMY;  Surgeon: Lorretta Harp, MD;  Location: [place] [facility];  Service: Cardiovascular;;  Rt SFA   Right long finger extensor tendon repair       THROMBECTOMY FEMORAL ARTERY Left 02/17/2022    Procedure: Repair of Left COMMON FEMORAL PSEUDOANEURYSM.;  Surgeon: Marty Heck, MD;  Location: [facility];  Service: Vascular;  Laterality: Left;      ROS:    General:  No weight loss, Fever, chills   HEENT: No recent headaches, no nasal bleeding, no visual changes, no sore throat   Neurologic: No dizziness, blackouts, seizures. No recent symptoms of stroke or mini- stroke. No recent episodes of slurred speech, or temporary blindness.   Cardiac: No recent episodes of chest pain/pressure, no shortness of breath at rest.  No shortness of breath with exertion.  Denies history of atrial fibrillation or irregular heartbeat   Vascular: No history of rest pain in feet.  No history of claudication.  No history of non-healing ulcer, No history of DVT    Pulmonary: No home oxygen, no productive cough, no hemoptysis,  No asthma or wheezing   Musculoskeletal:  '[ ]'$  Arthritis, '[ ]'$  Low back pain,  '[ ]'$  Joint pain   Hematologic:No history of hypercoagulable state.  No history of easy bleeding.  No history of anemia   Gastrointestinal: No hematochezia or melena,  No gastroesophageal reflux, no trouble swallowing   Urinary: '[ ]'$  chronic Kidney disease, '[ ]'$  on HD - '[ ]'$  MWF or '[ ]'$  TTHS, '[ ]'$  Burning with urination, '[ ]'$  Frequent urination, '[ ]'$  Difficulty urinating;    Skin: No rashes   Psychological: No history of anxiety,  No history of depression   Social History Social History         Tobacco Use   Smoking  status: Every Day      Packs/day: 1.50      Years: 43.00      Total pack years: 64.50      Types: Cigarettes      Passive exposure: Current (Wife)   Smokeless tobacco: Never   Tobacco comments:      Patient attempting to quit pt down to 1 ppd  Vaping Use   Vaping Use: Never used  Substance Use Topics   Alcohol use: Yes      Comment: 1-2 drink occasionally  Drug use: No      Family History      Family History  Problem Relation Age of Onset   Cancer Mother     Heart disease Brother        Allergies        Allergies  Allergen Reactions   Statins Other (See Comments)      Bone and muscle pain   Crestor [Rosuvastatin]        myalgias   Lipitor [Atorvastatin]        myalgias   Oxycodone Other (See Comments)   Hydrocodone Nausea And Vomiting              Current Outpatient Medications  Medication Sig Dispense Refill   acetaminophen (TYLENOL) 500 MG tablet Take 1,000 mg by mouth every 6 (six) hours as needed for mild pain.       amoxicillin-clavulanate (AUGMENTIN) 875-125 MG tablet Take 1 tablet by mouth 2 (two) times daily. 20 tablet 0   aspirin EC 81 MG tablet Take 1 tablet (81 mg total) by mouth daily. 90 tablet 3   carboxymethylcellulose 1 % ophthalmic solution Place 1 drop into both eyes daily as needed (dry eyes).       clopidogrel (PLAVIX) 75 MG tablet Take 1 tablet (75 mg total) by mouth daily. 90 tablet 3   diphenoxylate-atropine (LOMOTIL) 2.5-0.025 MG tablet Take 2 tablets by mouth 4 (four) times daily as needed for diarrhea or loose stools. 30 tablet 0   Evolocumab (REPATHA SURECLICK) 623 MG/ML SOAJ Inject 1 mL into the skin every 14 (fourteen) days. 6 mL 3   HYDROmorphone (DILAUDID) 2 MG tablet Take 1 tablet (2 mg total) by mouth every 4 (four) hours as needed for severe pain. 10 tablet 0   losartan-hydrochlorothiazide (HYZAAR) 100-12.5 MG tablet TAKE 1 TABLET BY MOUTH ONCE A DAY 90 tablet 2   nitroGLYCERIN (NITROSTAT) 0.4 MG SL tablet Place 1 tablet (0.4 mg  total) under the tongue every 5 (five) minutes as needed for chest pain. 25 tablet 2   pantoprazole (PROTONIX) 40 MG tablet TAKE 1 TABLET BY MOUTH ONCE A DAY 90 tablet 0   Probiotic Product (PROBIOTIC-10 ULTIMATE) CAPS Take 1 capsule by mouth daily.        No current facility-administered medications for this visit.      Physical Examination      Vitals:    08/25/22 1426  BP: (!) 162/81  Pulse: 75  Resp: 20  Temp: 98.2 F (36.8 C)  TempSrc: Temporal  SpO2: 98%  Weight: 211 lb (95.7 kg)  Height: '5\' 9"'$  (1.753 m)      Body mass index is 31.16 kg/m.   General:  Alert and oriented, no acute distress HEENT: Normal Neck: No bruit or JVD Pulmonary: Clear to auscultation bilaterally Cardiac: Regular Rate and Rhythm without murmur Abdomen: Soft, non-tender, non-distended, no mass, no scars Skin: No rash, no open wounds Extremity Pulses: 2+ femoral, doppler DP/PT monophasic left LE  Musculoskeletal: No deformity or edema      Neurologic: Upper and lower extremity motor grossly and symmetric         ASSESSMENT/PLAN:  PAD s/p pseudoaneurysmal repair New rest pain left LE.  I will schedule him for angiogram with left LE runoff and possible intervention.  I sent dilaudid PO 2 mg #10 for pain control.. he has allergy to other pain medications in the past.  He will continue ASA and Plavix.  Of note he has a history of lumbar disc herniation Left common femoral endarterectomy with bovine pericardial patch angioplasty and required discectomy in the past with radicular pain.           Roxy Horseman PA-C [facility] Office: [phone]   MD in clinic [place]

## 2022-08-28 NOTE — Progress Notes (Signed)
Dr. Carlis Abbott at bedside, safety maintained

## 2022-08-29 ENCOUNTER — Telehealth: Payer: Self-pay | Admitting: Cardiovascular Disease

## 2022-08-29 ENCOUNTER — Encounter (HOSPITAL_COMMUNITY): Payer: Self-pay | Admitting: Vascular Surgery

## 2022-08-29 DIAGNOSIS — I739 Peripheral vascular disease, unspecified: Secondary | ICD-10-CM

## 2022-08-29 DIAGNOSIS — I2510 Atherosclerotic heart disease of native coronary artery without angina pectoris: Secondary | ICD-10-CM

## 2022-08-29 MED FILL — Fentanyl Citrate Preservative Free (PF) Inj 100 MCG/2ML: INTRAMUSCULAR | Qty: 2 | Status: AC

## 2022-08-29 MED FILL — Midazolam HCl Inj 2 MG/2ML (Base Equivalent): INTRAMUSCULAR | Qty: 2 | Status: AC

## 2022-08-29 NOTE — Telephone Encounter (Signed)
Patient is requesting a letter from Dr. 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

## 2022-08-29 NOTE — Addendum Note (Signed)
Addended by: Aris Georgia, Demonie Kassa L on: 08/29/2022 04:17 PM   Modules accepted: Orders

## 2022-08-29 NOTE — Telephone Encounter (Signed)
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 Patient verbalized understanding. Placed order for [place].

## 2022-08-31 NOTE — Progress Notes (Signed)
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 Complicated by HTN, AFib, CAD, L Carotid Artery Occlusion, Chronic Bronchitis, GERD, CKD3, Cervical Ankylosis, Hypertriglyceridemia, Covid infection  06/2020,  NPSG 07/30/1999- AHI 56/hr, desaturation to 85%, body weight 195 lbs PFT 08/16/12- minimal obstruction, Nl DLCO ===================================================   09/02/21- 60 yoM Smoker ( 1.5 ppd/ 64.5 pkyrs) Coming to Re-establish seen for Obstructive sleep apnea.  Complicated by HTN, AFib, CAD, L Carotid Artery Occlusion, Chronic Bronchitis, GERD, CKD3, Cervical Ankylosis, Hypertriglyceridemia, Covid infection  06/2020,  NPSG 07/30/1999- AHI 56/hr, desaturation to 85%, body weight 195 lbs Epworth score- CPAP- 9.2 cwp/ [employer]      AirSense 10 AutoSet Download- compliance 100%, AHI 0.8/ hr Body weight today-210 lbs Covid vax-none Flu vax-none Does well with CPAP, uncomfortable w/o.  Download reviewed. PFT 08/16/12- minimal obstruction, Nl DLCO Some comfort issues with masks tried. Discussed mask fitting.  Has smoking cessation program through work. Denies active breathing problems.  09/02/22-  53 yoM Smoker ( 1.5 ppd/ 64.5 pkyrs) followed  for Obstructive sleep apnea.(DOT/CDL)  Complicated by HTN, AFib, CAD, L Carotid Artery Occlusion,  PAD/femoral stent, Chronic Bronchitis, GERD, CKD3, Cervical Ankylosis, Hypertriglyceridemia, Covid infection  06/2020,  CPAP- auto 5-15 cwp/ [employer]      AirSense 10 AutoSet  replaced 2023 Download- compliance 100%, AHI 1.2/ hr Body weight today 212 lbs Covid vax-none Flu vax-declines Op 08/28/22- femoral artery angioplasty/ stent  Pt states new CPAP is working really well Download reviewed . 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 Still smoking- no inclination to stop. Says his breathing is "fine, no problem". CT chest low dose screen 05/22/22- IMPRESSION: 1. Lung-RADS 2, benign appearance or  behavior. Continue annual screening with low-dose chest CT without contrast in 12 months. 2. Aortic Atherosclerosis (ICD10-I70.0) and Emphysema (ICD10-J43.9).   ROS-see HPI Constitutional:   No-   weight loss, night sweats, fevers, chills, fatigue, lassitude. HEENT:   No-  headaches, difficulty swallowing, tooth/dental problems, sore throat,       No-  sneezing, itching, ear ache, nasal congestion, post nasal drip,  CV:  No-   chest pain, orthopnea, PND, swelling in lower extremities, anasarca, dizziness, palpitations Resp: No-   shortness of breath with exertion or at rest.              No-   productive cough,  No non-productive cough,  No- coughing up of blood.              No-   change in color of mucus.  No- wheezing.   Skin: No-   rash or lesions. GI:  No-   heartburn, indigestion, abdominal pain, nausea, vomiting,  GU: . MS:  No-   joint pain or swelling.  Neuro-     nothing unusual Psych:  No- change in mood or affect. No depression or anxiety.  No memory loss.  OBJ- Physical Exam General- Alert, Oriented, Affect-appropriate, Distress- none acute, medium build Skin- rash-none, lesions- none, excoriation- none Lymphadenopathy- none Head- atraumatic            Eyes- Gross vision intact, PERRLA, conjunctivae and secretions clear            Ears- Hearing, canals-normal            Nose- Clear, no-Septal dev, mucus, polyps, erosion, perforation             Throat- Mallampati IV , mucosa clear , drainage- none, tonsils- atrophic Neck-  flexible , trachea midline, no stridor , thyroid nl, carotid no bruit Chest - symmetrical excursion , unlabored           Heart/CV- RRR , no murmur , no gallop  , no rub, nl s1 s2                           - JVD- none , edema- none, stasis changes- none, varices- none           Lung- clear to P&A, wheeze- none, cough- none , dullness-none, rub- none           Chest wall-  Abd-  Br/ Gen/ Rectal- Not done, not indicated Extrem- cyanosis- none,  clubbing, none, atrophy- none, strength- nl Neuro- grossly intact to observation

## 2022-09-02 ENCOUNTER — Encounter: Payer: Self-pay | Admitting: Internal Medicine

## 2022-09-02 ENCOUNTER — Ambulatory Visit: Payer: BC Managed Care – PPO | Admitting: Internal Medicine

## 2022-09-02 VITALS — BP 122/68 | HR 77 | Ht 69.0 in | Wt 212.6 lb

## 2022-09-02 DIAGNOSIS — G4733 Obstructive sleep apnea (adult) (pediatric): Secondary | ICD-10-CM | POA: Diagnosis not present

## 2022-09-02 DIAGNOSIS — J410 Simple chronic bronchitis: Secondary | ICD-10-CM | POA: Diagnosis not present

## 2022-09-02 DIAGNOSIS — Z720 Tobacco use: Secondary | ICD-10-CM

## 2022-09-02 NOTE — Assessment & Plan Note (Signed)
Benefits from CPAP with good compliance and control. Plan-continue auto 5-15.  Letter for employer.

## 2022-09-02 NOTE — Patient Instructions (Signed)
We can continue CPAP auto 5-15 ° °Please call if we can help °

## 2022-09-02 NOTE — Assessment & Plan Note (Signed)
Support efforts available when he decides he wants to stop smoking.

## 2022-09-02 NOTE — Assessment & Plan Note (Signed)
He minimizes symptoms and impact.  Continues to smoke.

## 2022-09-03 ENCOUNTER — Telehealth: Payer: Self-pay

## 2022-09-03 NOTE — Telephone Encounter (Signed)
Pt walked into office c/o L leg and foot swelling. Pt filled out triage form and waited to be triaged.  Brought pt back to private area to discuss issues. Pt appeared to have minimal swelling, but he was concerned. Informed him that swelling can be normal post procedure and instructed him on proper elevation since he was not doing this at home. Pt was reassured and will monitor after properly elevating. Will call office if any worsening issues arise. Confirmed understanding.

## 2022-09-05 NOTE — Addendum Note (Signed)
Addended by: Josue Hector on: 09/05/2022 08:07 AM   Modules accepted: Orders

## 2022-09-09 ENCOUNTER — Telehealth (HOSPITAL_COMMUNITY): Payer: Self-pay | Admitting: *Deleted

## 2022-09-09 ENCOUNTER — Encounter: Payer: Self-pay | Admitting: Internal Medicine

## 2022-09-09 NOTE — Telephone Encounter (Signed)
Gave pt instructions for MPI study on 09/10/22.

## 2022-09-10 ENCOUNTER — Ambulatory Visit (HOSPITAL_COMMUNITY): Payer: BC Managed Care – PPO | Attending: Cardiovascular Disease

## 2022-09-10 DIAGNOSIS — I739 Peripheral vascular disease, unspecified: Secondary | ICD-10-CM

## 2022-09-10 DIAGNOSIS — I2510 Atherosclerotic heart disease of native coronary artery without angina pectoris: Secondary | ICD-10-CM | POA: Diagnosis present

## 2022-09-10 DIAGNOSIS — I2584 Coronary atherosclerosis due to calcified coronary lesion: Secondary | ICD-10-CM | POA: Insufficient documentation

## 2022-09-10 LAB — MYOCARDIAL PERFUSION IMAGING
LV dias vol: 94 mL (ref 62–150)
LV sys vol: 34 mL
Nuc Stress EF: 64 %
Peak HR: 90 {beats}/min
Rest HR: 66 {beats}/min
Rest Nuclear Isotope Dose: 10.9 mCi
SDS: 2
SRS: 1
SSS: 3
ST Depression (mm): 0 mm
Stress Nuclear Isotope Dose: 32.8 mCi
TID: 1.09

## 2022-09-10 MED ORDER — TECHNETIUM TC 99M TETROFOSMIN IV KIT
32.8000 | PACK | Freq: Once | INTRAVENOUS | Status: AC | PRN
  Administered 2022-09-10: 32.8 via INTRAVENOUS

## 2022-09-10 MED ORDER — TECHNETIUM TC 99M TETROFOSMIN IV KIT
10.9000 | PACK | Freq: Once | INTRAVENOUS | Status: AC | PRN
  Administered 2022-09-10: 10.9 via INTRAVENOUS

## 2022-09-10 MED ORDER — REGADENOSON 0.4 MG/5ML IV SOLN
0.4000 mg | Freq: Once | INTRAVENOUS | Status: AC
  Administered 2022-09-10: 0.4 mg via INTRAVENOUS

## 2022-09-11 ENCOUNTER — Other Ambulatory Visit: Payer: Self-pay | Admitting: *Deleted

## 2022-09-11 DIAGNOSIS — I739 Peripheral vascular disease, unspecified: Secondary | ICD-10-CM

## 2022-09-29 NOTE — Progress Notes (Unsigned)
HISTORY AND PHYSICAL     CC:  follow up. Requesting Provider:  Susy Frizzle, MD  HPI: This is a 66 y.o. male who is here today for follow up for PAD.  Pt has hx of CLI with rest pain and recently underwent angiogram via right CFA with left SFA and above knee popliteal artery angioplasty with stent placement and angioplasty of left behind the knee popliteal artery on 08/28/2022 by Dr. Carlis Abbott.    He also has hx of repair left CFA psa with endarterectomy with bovine patch angioplasty on 02/17/2022 also by Dr. Carlis Abbott.    The pt returns today for follow up studies and evaluation.  He states he is doing well.  He does not really have any cramping in his legs with walking or rest pain or non healing wounds.  He continues to smoke.  He is compliant with his Repatha, asa and plavix.    The pt is on a statin for cholesterol management.   (Repatha) The pt is on an aspirin.    Other AC:  Plavix The pt is on ARB, diuretic for hypertension.  The pt does not have diabetes. Tobacco hx:  current  Pt does not have family hx of AAA.  Past Medical History:  Diagnosis Date   Cervical spine ankylosis    CKD (chronic kidney disease) stage 3, GFR 30-59 ml/min (HCC)    COLONIC POLYPS, HX OF    CORONARY ARTERY DISEASE    a. s/p stent to RCA 2005. b. Stent to Cx 2007. c.  CABG X4 2014; d. LHC 04/19/18 occluded SVG-Circ, DES to in-stent restenosis of circ.    COVID-19 2020   Diverticulosis    DIVERTICULOSIS, COLON    DYSPHAGIA UNSPECIFIED    intermittent   GERD    HEMORRHOIDS    HYPERLIPIDEMIA    HYPERTENSION    HYPERTRIGLYCERIDEMIA    Left carotid artery occlusion    OBESITY    Postoperative atrial fibrillation ([place]) 2014   Pseudoaneurysm of left femoral artery ([place]) 02/16/2022   SLEEP APNEA    on CPAP   Statin intolerance    Tobacco abuse    Vertebral artery occlusion, left     Past Surgical History:  Procedure Laterality Date   ABDOMINAL AORTOGRAM W/LOWER EXTREMITY N/A 02/13/2022    Procedure: ABDOMINAL AORTOGRAM W/LOWER EXTREMITY;  Surgeon: Lorretta Harp, MD;  Location: [place] [facility];  Service: Cardiovascular;  Laterality: N/A;   ABDOMINAL AORTOGRAM W/LOWER EXTREMITY N/A 08/28/2022   Procedure: ABDOMINAL AORTOGRAM W/LOWER EXTREMITY;  Surgeon: Marty Heck, MD;  Location: [place] [facility];  Service: Cardiovascular;  Laterality: N/A;   ACHILLES TENDON REPAIR     BACK SURGERY     lower back   CARDIAC CATHETERIZATION     CORONARY ARTERY BYPASS GRAFT  08/18/2012   Procedure: CORONARY ARTERY BYPASS GRAFTING (CABG);  Surgeon: Gaye Pollack, MD;  Location: [place];  Service: Open Heart Surgery;  Laterality: N/A;  CABG x four;  using left internal mammary artery and right leg greater saphenous vein harvested endoscopically   CORONARY STENT INTERVENTION N/A 04/19/2018   Procedure: CORONARY STENT INTERVENTION;  Surgeon: Lorretta Harp, MD;  Location: [place] [facility];  Service: Cardiovascular;  Laterality: N/A;   ENDARTERECTOMY FEMORAL Left 02/17/2022   Procedure: ENDARTERECTOMY FEMORAL;  Surgeon: Marty Heck, MD;  Location: [place];  Service: Vascular;  Laterality: Left;   INTRAOPERATIVE ARTERIOGRAM Left 02/17/2022   Procedure: INTRA OPERATIVE ARTERIOGRAM;  Surgeon: Carlis Abbott,  Gwenyth Allegra, MD;  Location: [facility];  Service: Vascular;  Laterality: Left;   KNEE ARTHROSCOPY     RIGHT   LEFT HEART CATH AND CORS/GRAFTS ANGIOGRAPHY N/A 04/19/2018   Procedure: LEFT HEART CATH AND CORS/GRAFTS ANGIOGRAPHY;  Surgeon: Lorretta Harp, MD;  Location: [place] [facility];  Service: Cardiovascular;  Laterality: N/A;   LUMBAR LAMINECTOMY/DECOMPRESSION MICRODISCECTOMY N/A 12/05/2015   Procedure: LUMBAR 2-3, LUMBAR 3-4 DECOMPRESSION ;  Surgeon: Phylliss Bob, MD;  Location: [place];  Service: Orthopedics;  Laterality: N/A;   PATCH ANGIOPLASTY Left 02/17/2022   Procedure: PATCH ANGIOPLASTY Left Common Femoral Artery.;  Surgeon: Marty Heck, MD;  Location: [place];  Service:  Vascular;  Laterality: Left;   PERIPHERAL VASCULAR ATHERECTOMY  02/13/2022   Procedure: PERIPHERAL VASCULAR ATHERECTOMY;  Surgeon: Lorretta Harp, MD;  Location: [place] [facility];  Service: Cardiovascular;;  Rt SFA   PERIPHERAL VASCULAR INTERVENTION Left 08/28/2022   Procedure: PERIPHERAL VASCULAR INTERVENTION;  Surgeon: Marty Heck, MD;  Location: [place] [facility];  Service: Cardiovascular;  Laterality: Left;  SFA/POP   Right long finger extensor tendon repair     THROMBECTOMY FEMORAL ARTERY Left 02/17/2022   Procedure: Repair of Left COMMON FEMORAL PSEUDOANEURYSM.;  Surgeon: Marty Heck, MD;  Location: [place];  Service: Vascular;  Laterality: Left;    Allergies  Allergen Reactions   Statins Other (See Comments)    Bone and muscle pain   Crestor [Rosuvastatin]     myalgias   Lipitor [Atorvastatin]     myalgias   Oxycodone Nausea And Vomiting   Hydrocodone Nausea And Vomiting    Current Outpatient Medications  Medication Sig Dispense Refill   acetaminophen (TYLENOL) 500 MG tablet Take 1,000 mg by mouth every 6 (six) hours as needed for mild pain.     aspirin EC 81 MG tablet Take 1 tablet (81 mg total) by mouth daily. 90 tablet 3   clopidogrel (PLAVIX) 75 MG tablet Take 1 tablet (75 mg total) by mouth daily. 90 tablet 3   Evolocumab (REPATHA SURECLICK) 431 MG/ML SOAJ Inject 1 mL into the skin every 14 (fourteen) days. 6 mL 3   HYDROmorphone (DILAUDID) 2 MG tablet Take 1 tablet (2 mg total) by mouth every 4 (four) hours as needed for severe pain. 10 tablet 0   losartan-hydrochlorothiazide (HYZAAR) 100-12.5 MG tablet TAKE 1 TABLET BY MOUTH ONCE A DAY 90 tablet 2   nitroGLYCERIN (NITROSTAT) 0.4 MG SL tablet Place 1 tablet (0.4 mg total) under the tongue every 5 (five) minutes as needed for chest pain. 25 tablet 2   pantoprazole (PROTONIX) 40 MG tablet TAKE 1 TABLET BY MOUTH ONCE A DAY 90 tablet 0   Probiotic Product (PROBIOTIC-10 ULTIMATE) CAPS Take 1 capsule by mouth  daily.     No current facility-administered medications for this visit.    Family History  Problem Relation Age of Onset   Cancer Mother    Heart disease Brother     Social History   Socioeconomic History   Marital status: Married    Spouse name: Not on file   Number of children: Not on file   Years of education: Not on file   Highest education level: Not on file  Occupational History   Not on file  Tobacco Use   Smoking status: Every Day    Packs/day: 1.50    Years: 43.00    Total pack years: 64.50    Types: Cigarettes    Passive exposure:  Current (Wife)   Smokeless tobacco: Never   Tobacco comments:    Pt still smoking 1 PPD 09/02/2022 PAP  Vaping Use   Vaping Use: Never used  Substance and Sexual Activity   Alcohol use: Yes    Comment: 1-2 drink occasionally   Drug use: No   Sexual activity: Not on file  Other Topics Concern   Not on file  Social History Narrative   Not on file   Social Determinants of Health   Financial Resource Strain: Not on file  Food Insecurity: Not on file  Transportation Needs: Not on file  Physical Activity: Not on file  Stress: Not on file  Social Connections: Not on file  Intimate Partner Violence: Not on file     REVIEW OF SYSTEMS:   '[X]'$  denotes positive finding, '[ ]'$  denotes negative finding Cardiac  Comments:  Chest pain or chest pressure:    Shortness of breath upon exertion:    Short of breath when lying flat:    Irregular heart rhythm:        Vascular    Pain in calf, thigh, or hip brought on by ambulation:    Pain in feet at night that wakes you up from your sleep:     Blood clot in your veins:    Leg swelling:         Pulmonary    Oxygen at home:    Productive cough:     Wheezing:         Neurologic    Sudden weakness in arms or legs:     Sudden numbness in arms or legs:     Sudden onset of difficulty speaking or slurred speech:    Temporary loss of vision in one eye:     Problems with dizziness:          Gastrointestinal    Blood in stool:     Vomited blood:         Genitourinary    Burning when urinating:     Blood in urine:        Psychiatric    Major depression:         Hematologic    Bleeding problems:    Problems with blood clotting too easily:        Skin    Rashes or ulcers:        Constitutional    Fever or chills:      PHYSICAL EXAMINATION:  Today's Vitals   09/30/22 1114  BP: (!) 165/79  Pulse: 68  Resp: 20  Temp: 98.1 F (36.7 C)  TempSrc: Temporal  SpO2: 97%  Weight: 210 lb (95.3 kg)  Height: '5\' 9"'$  (1.753 m)   Body mass index is 31.01 kg/m.   General:  WDWN in NAD; vital signs documented above Gait: Not observed HENT: WNL, normocephalic Pulmonary: normal non-labored breathing , without wheezing Cardiac: regular HR, without carotid bruits Abdomen: soft, NT; aortic pulse is not palpable Skin: without rashes Vascular Exam/Pulses:  Right Left  Radial 2+ (normal) 2+ (normal)  Femoral 1+ (weak) 2+ (normal)  Popliteal Unable to palpate Unable to palpate  DP Unable to palpate  2+ (normal)  PT 2+ (normal) 2+ (normal)   Extremities: without ischemic changes, without Gangrene , without cellulitis; without open wounds Musculoskeletal: no muscle wasting or atrophy  Neurologic: A&O X 3 Psychiatric:  The pt has Normal affect.   Non-Invasive Vascular Imaging:   ABI's/TBI's on 09/30/2022: Right:  1.11/0.51 - Great  toe pressure: 69 Left:  0.76/0.54 - Great toe pressure: 73  Arterial duplex on 09/30/2022: +-----------+--------+-----+--------+----------+--------+  LEFT      PSV cm/sRatioStenosisWaveform  Comments  +-----------+--------+-----+--------+----------+--------+  CFA Prox   118                  triphasic           +-----------+--------+-----+--------+----------+--------+  CFA Distal 122                  biphasic            +-----------+--------+-----+--------+----------+--------+  DFA       182                   biphasic            +-----------+--------+-----+--------+----------+--------+  SFA Prox                                  stent     +-----------+--------+-----+--------+----------+--------+  SFA Mid                                   stent     +-----------+--------+-----+--------+----------+--------+  SFA Distal                                stent     +-----------+--------+-----+--------+----------+--------+  POP Prox   118                  biphasic  stent     +-----------+--------+-----+--------+----------+--------+  POP Mid    126                  triphasic           +-----------+--------+-----+--------+----------+--------+  POP Distal 140                  triphasic           +-----------+--------+-----+--------+----------+--------+  TP Trunk   188                  biphasic            +-----------+--------+-----+--------+----------+--------+  ATA Distal 32                   monophasic          +-----------+--------+-----+--------+----------+--------+  PTA Distal 32                   monophasic          +-----------+--------+-----+--------+----------+--------+  PERO Distal56                   monophasic          +-----------+--------+-----+--------+----------+--------+    Left Stent(s): SFA and popliteal stent.  +---------------+---++---------++  Prox to Stent  138biphasic   +---------------+---++---------++  Proximal Stent 139biphasic   +---------------+---++---------++  Mid Stent      67 biphasic   +---------------+---++---------++  Distal Stent   56 triphasic  +---------------+---++---------++  Distal to Stent122triphasic  +---------------+---++---------++   Summary:  Left: Patent stent with no evidence for restenosis.   Previous ABI's/TBI's on 02/20/2022: Right:  0.99/0.53 - Great toe pressure: 84 Left:  0.85/0.56 - Great toe pressure:  89  Previous arterial duplex on  02/20/2022: Right: 30-49% stenosis noted in  the common femoral artery. Right SFA is patent, s/p atherectomy     ASSESSMENT/PLAN:: 66 y.o. male here for follow up for PAD with hx of angiogram via right CFA with left SFA and above knee popliteal artery angioplasty with stent placement and angioplasty of left behind the knee popliteal artery on 08/28/2022 by Dr. Carlis Abbott.     He also has hx of repair left CFA psa with endarterectomy with bovine patch angioplasty on 02/17/2022 also by Dr. Carlis Abbott and right SFA atherectomy and drug coated angioplasty by Dr. Gwenlyn Found on 02/13/22.  PAD -pt left SFA stent patent without stenosis and he has palpable left DP and PT pulses.  He has a palpable right PT pulse as well.   -continue asa/plavix/repatha -pt will f/u in 6 months with ABI and BLE arterial duplex.  Pt would like to have [facility] follow him for his peripheral vascular issues to have in one office as it is more convenient for him -pt knows to call sooner if he has any issues before then  Current smoker -pt continues to smoke.  Discussion regarding importance of smoking cessation and being at risk for heart disease, stroke and limb loss.  Discussed that given his family hx, he would certainly benefit from quitting smoking.    Leontine Locket, [facility] [phone]  Clinic MD:   Carlis Abbott

## 2022-09-30 ENCOUNTER — Ambulatory Visit (INDEPENDENT_AMBULATORY_CARE_PROVIDER_SITE_OTHER)
Admission: RE | Admit: 2022-09-30 | Discharge: 2022-09-30 | Disposition: A | Discharge status: Home / Self Care | Payer: BC Managed Care – PPO | Source: Ambulatory Visit | Attending: Vascular Surgery | Admitting: Vascular Surgery

## 2022-09-30 ENCOUNTER — Ambulatory Visit (INDEPENDENT_AMBULATORY_CARE_PROVIDER_SITE_OTHER): Payer: BC Managed Care – PPO | Admitting: Physician Assistant

## 2022-09-30 ENCOUNTER — Ambulatory Visit (HOSPITAL_COMMUNITY)
Admission: RE | Admit: 2022-09-30 | Discharge: 2022-09-30 | Disposition: A | Discharge status: Home / Self Care | Payer: BC Managed Care – PPO | Source: Ambulatory Visit | Attending: Vascular Surgery | Admitting: Vascular Surgery

## 2022-09-30 VITALS — BP 165/79 | HR 68 | Temp 98.1°F | Resp 20 | Ht 69.0 in | Wt 210.0 lb

## 2022-09-30 DIAGNOSIS — F17200 Nicotine dependence, unspecified, uncomplicated: Secondary | ICD-10-CM

## 2022-09-30 DIAGNOSIS — I739 Peripheral vascular disease, unspecified: Secondary | ICD-10-CM

## 2022-10-06 ENCOUNTER — Other Ambulatory Visit: Payer: Self-pay

## 2022-10-06 DIAGNOSIS — I739 Peripheral vascular disease, unspecified: Secondary | ICD-10-CM

## 2022-11-11 ENCOUNTER — Ambulatory Visit: Payer: BC Managed Care – PPO | Admitting: Family Medicine

## 2022-11-12 ENCOUNTER — Ambulatory Visit: Payer: BC Managed Care – PPO | Admitting: Family Medicine

## 2022-11-12 ENCOUNTER — Encounter: Payer: Self-pay | Admitting: Family Medicine

## 2022-11-12 VITALS — BP 138/62 | HR 74 | Temp 97.7°F | Ht 69.0 in | Wt 212.0 lb

## 2022-11-12 DIAGNOSIS — J069 Acute upper respiratory infection, unspecified: Secondary | ICD-10-CM | POA: Diagnosis not present

## 2022-11-12 NOTE — Patient Instructions (Signed)
Your symptoms and exam findings are most consistent with a viral upper respiratory infection. These usually run their course in 5-7 days. Unfortunately, antibiotics don't work against viruses and just increase your risk of other issues such as diarrhea, yeast infections, and resistant infections.  If your symptoms last longer than 10 days and/or you start feeling worse with facial pain, high fever, cough, shortness of breath or start feeling significantly worse, please call us right away to be further evaluated.  Some things that can make you feel better are: - Increased rest - Increasing fluid with water/sugar free electrolytes - Acetaminophen as needed for fever/pain.  - Salt water gargling, chloraseptic spray and throat lozenges - OTC guaifenesin (Mucinex).  - Saline sinus flushes or a neti pot.  - Humidifying the air.  - Delsym for cough.

## 2022-11-12 NOTE — Progress Notes (Signed)
Acute Office Visit  Subjective:     Patient ID: [name], male    DOB: 1956/05/26, 67 y.o.   MRN: [id number]  Chief Complaint  Patient presents with   Acute Visit    head cold: green mucous, drainage, coughing, sneezing. No recent COVID test. Will wear a mask.    HPI Patient is in today for 4-5 days of congestion, mucopurulent cough, ear pressure, sinus pressure Denies headache, fever, chills, body aches, chest pain, nausea, shortness of breath. Has tried halls cough drops.  Review of Systems  All other systems reviewed and are negative.   Past Medical History:  Diagnosis Date   Cervical spine ankylosis    CKD (chronic kidney disease) stage 3, GFR 30-59 ml/min (HCC)    COLONIC POLYPS, HX OF    CORONARY ARTERY DISEASE    a. s/p stent to RCA 2005. b. Stent to Cx 2007. c.  CABG X4 2014; d. LHC 04/19/18 occluded SVG-Circ, DES to in-stent restenosis of circ.    COVID-19 2020   Diverticulosis    DIVERTICULOSIS, COLON    DYSPHAGIA UNSPECIFIED    intermittent   GERD    HEMORRHOIDS    HYPERLIPIDEMIA    HYPERTENSION    HYPERTRIGLYCERIDEMIA    Left carotid artery occlusion    OBESITY    Postoperative atrial fibrillation ([place]) 2014   Pseudoaneurysm of left femoral artery ([place]) 02/16/2022   SLEEP APNEA    on CPAP   Statin intolerance    Tobacco abuse    Vertebral artery occlusion, left    Past Surgical History:  Procedure Laterality Date   ABDOMINAL AORTOGRAM W/LOWER EXTREMITY N/A 02/13/2022   Procedure: ABDOMINAL AORTOGRAM W/LOWER EXTREMITY;  Surgeon: Lorretta Harp, MD;  Location: [place] [facility];  Service: Cardiovascular;  Laterality: N/A;   ABDOMINAL AORTOGRAM W/LOWER EXTREMITY N/A 08/28/2022   Procedure: ABDOMINAL AORTOGRAM W/LOWER EXTREMITY;  Surgeon: Marty Heck, MD;  Location: [place] [facility];  Service: Cardiovascular;  Laterality: N/A;   ACHILLES TENDON REPAIR     BACK SURGERY     lower back   CARDIAC CATHETERIZATION     CORONARY ARTERY  BYPASS GRAFT  08/18/2012   Procedure: CORONARY ARTERY BYPASS GRAFTING (CABG);  Surgeon: Gaye Pollack, MD;  Location: [place];  Service: Open Heart Surgery;  Laterality: N/A;  CABG x four;  using left internal mammary artery and right leg greater saphenous vein harvested endoscopically   CORONARY STENT INTERVENTION N/A 04/19/2018   Procedure: CORONARY STENT INTERVENTION;  Surgeon: Lorretta Harp, MD;  Location: [place] [facility];  Service: Cardiovascular;  Laterality: N/A;   ENDARTERECTOMY FEMORAL Left 02/17/2022   Procedure: ENDARTERECTOMY FEMORAL;  Surgeon: Marty Heck, MD;  Location: [place];  Service: Vascular;  Laterality: Left;   INTRAOPERATIVE ARTERIOGRAM Left 02/17/2022   Procedure: INTRA OPERATIVE ARTERIOGRAM;  Surgeon: Marty Heck, MD;  Location: [place];  Service: Vascular;  Laterality: Left;   KNEE ARTHROSCOPY     RIGHT   LEFT HEART CATH AND CORS/GRAFTS ANGIOGRAPHY N/A 04/19/2018   Procedure: LEFT HEART CATH AND CORS/GRAFTS ANGIOGRAPHY;  Surgeon: Lorretta Harp, MD;  Location: [place] [facility];  Service: Cardiovascular;  Laterality: N/A;   LUMBAR LAMINECTOMY/DECOMPRESSION MICRODISCECTOMY N/A 12/05/2015   Procedure: LUMBAR 2-3, LUMBAR 3-4 DECOMPRESSION ;  Surgeon: Phylliss Bob, MD;  Location: [place];  Service: Orthopedics;  Laterality: N/A;   PATCH ANGIOPLASTY Left 02/17/2022   Procedure: PATCH ANGIOPLASTY Left Common Femoral Artery.;  Surgeon: Carlis Abbott,  Gwenyth Allegra, MD;  Location: [place];  Service: Vascular;  Laterality: Left;   PERIPHERAL VASCULAR ATHERECTOMY  02/13/2022   Procedure: PERIPHERAL VASCULAR ATHERECTOMY;  Surgeon: Lorretta Harp, MD;  Location: [place] [facility];  Service: Cardiovascular;;  Rt SFA   PERIPHERAL VASCULAR INTERVENTION Left 08/28/2022   Procedure: PERIPHERAL VASCULAR INTERVENTION;  Surgeon: Marty Heck, MD;  Location: [place] [facility];  Service: Cardiovascular;  Laterality: Left;  SFA/POP   Right long finger extensor tendon repair      THROMBECTOMY FEMORAL ARTERY Left 02/17/2022   Procedure: Repair of Left COMMON FEMORAL PSEUDOANEURYSM.;  Surgeon: Marty Heck, MD;  Location: [facility];  Service: Vascular;  Laterality: Left;   Current Outpatient Medications on File Prior to Visit  Medication Sig Dispense Refill   acetaminophen (TYLENOL) 500 MG tablet Take 1,000 mg by mouth every 6 (six) hours as needed for mild pain.     aspirin EC 81 MG tablet Take 1 tablet (81 mg total) by mouth daily. 90 tablet 3   clopidogrel (PLAVIX) 75 MG tablet Take 1 tablet (75 mg total) by mouth daily. 90 tablet 3   Evolocumab (REPATHA SURECLICK) 761 MG/ML SOAJ Inject 1 mL into the skin every 14 (fourteen) days. 6 mL 3   HYDROmorphone (DILAUDID) 2 MG tablet Take 1 tablet (2 mg total) by mouth every 4 (four) hours as needed for severe pain. 10 tablet 0   losartan-hydrochlorothiazide (HYZAAR) 100-12.5 MG tablet TAKE 1 TABLET BY MOUTH ONCE A DAY 90 tablet 2   nitroGLYCERIN (NITROSTAT) 0.4 MG SL tablet Place 1 tablet (0.4 mg total) under the tongue every 5 (five) minutes as needed for chest pain. 25 tablet 2   pantoprazole (PROTONIX) 40 MG tablet TAKE 1 TABLET BY MOUTH ONCE A DAY 90 tablet 0   Probiotic Product (PROBIOTIC-10 ULTIMATE) CAPS Take 1 capsule by mouth daily.     No current facility-administered medications on file prior to visit.   Allergies  Allergen Reactions   Statins Other (See Comments)    Bone and muscle pain   Crestor [Rosuvastatin]     myalgias   Lipitor [Atorvastatin]     myalgias   Oxycodone Nausea And Vomiting   Hydrocodone Nausea And Vomiting       Objective:    BP 138/62   Pulse 74   Temp 97.7 F (36.5 C) (Oral)   Ht '5\' 9"'$  (1.753 m)   Wt 212 lb (96.2 kg)   SpO2 98%   BMI 31.31 kg/m    Physical Exam Vitals and nursing note reviewed.  Constitutional:      Appearance: Normal appearance. He is normal weight.  HENT:     Head: Normocephalic and atraumatic.     Right Ear: Tympanic membrane, ear canal and  external ear normal.     Left Ear: Ear canal and external ear normal. Tympanic membrane is perforated.     Nose: Nose normal.     Mouth/Throat:     Mouth: Mucous membranes are moist.     Pharynx: Oropharynx is clear.  Eyes:     Conjunctiva/sclera: Conjunctivae normal.  Cardiovascular:     Rate and Rhythm: Normal rate and regular rhythm.     Pulses: Normal pulses.     Heart sounds: Normal heart sounds.  Pulmonary:     Effort: Pulmonary effort is normal.     Breath sounds: Normal breath sounds.  Musculoskeletal:     Cervical back: No tenderness.  Lymphadenopathy:  Cervical: No cervical adenopathy.  Skin:    General: Skin is warm and dry.     Capillary Refill: Capillary refill takes less than 2 seconds.  Neurological:     General: No focal deficit present.     Mental Status: He is alert and oriented to person, place, and time. Mental status is at baseline.  Psychiatric:        Mood and Affect: Mood normal.        Behavior: Behavior normal.        Thought Content: Thought content normal.        Judgment: Judgment normal.     No results found for any visits on 11/12/22.      Assessment & Plan:   Problem List Items Addressed This Visit       Respiratory   Viral URI with cough - Primary    Reassured patient that symptoms and exam findings are most consistent with a viral upper respiratory infection and explained lack of efficacy of antibiotics against viruses.  Discussed expected course and features suggestive of secondary bacterial infection.  Continue supportive care. Increase fluid intake with water or electrolyte solution like pedialyte. Encouraged acetaminophen as needed for fever/pain. Encouraged salt water gargling, chloraseptic spray and throat lozenges. Encouraged OTC guaifenesin. Encouraged saline sinus flushes and/or neti with humidified air.         No orders of the defined types were placed in this encounter.   Return if symptoms worsen or fail to  improve.  Rubie Maid, FNP

## 2022-11-12 NOTE — Assessment & Plan Note (Signed)

## 2022-11-24 ENCOUNTER — Ambulatory Visit: Payer: BC Managed Care – PPO | Admitting: Family Medicine

## 2022-11-24 ENCOUNTER — Other Ambulatory Visit: Payer: Self-pay | Admitting: Cardiovascular Disease

## 2022-11-24 ENCOUNTER — Encounter: Payer: Self-pay | Admitting: Family Medicine

## 2022-11-24 VITALS — BP 138/72 | HR 84 | Temp 97.8°F | Ht 69.0 in | Wt 210.0 lb

## 2022-11-24 DIAGNOSIS — H7292 Unspecified perforation of tympanic membrane, left ear: Secondary | ICD-10-CM

## 2022-11-24 DIAGNOSIS — J019 Acute sinusitis, unspecified: Secondary | ICD-10-CM

## 2022-11-24 DIAGNOSIS — H918X2 Other specified hearing loss, left ear: Secondary | ICD-10-CM | POA: Diagnosis not present

## 2022-11-24 DIAGNOSIS — I2510 Atherosclerotic heart disease of native coronary artery without angina pectoris: Secondary | ICD-10-CM

## 2022-11-24 DIAGNOSIS — N183 Chronic kidney disease, stage 3 unspecified: Secondary | ICD-10-CM

## 2022-11-24 DIAGNOSIS — E782 Mixed hyperlipidemia: Secondary | ICD-10-CM

## 2022-11-24 DIAGNOSIS — H6993 Unspecified Eustachian tube disorder, bilateral: Secondary | ICD-10-CM | POA: Diagnosis not present

## 2022-11-24 DIAGNOSIS — I10 Essential (primary) hypertension: Secondary | ICD-10-CM

## 2022-11-24 MED ORDER — AMOXICILLIN-POT CLAVULANATE 875-125 MG PO TABS: 1.0000 | ORAL_TABLET | Freq: Two times a day (BID) | ORAL | 0 refills | Status: DC

## 2022-11-24 NOTE — Progress Notes (Signed)
Subjective:    Patient ID: [name], male    DOB: August 09, 1956, 67 y.o.   MRN: [id number]  HPI Patient reports a 10-day history of head congestion, rhinorrhea, decreased hearing and sinus pressure.  He has decreased hearing in his left ear.  On examination, he appears to have a perforated eardrum with a scab at roughly 9 and 10:00.  He reports symptoms consistent with eustachian tube dysfunction.  He states that his head feels like it is underwater.  He reports an echo whenever he speaks.  He also reports a headache and sinus pressure. Past Medical History:  Diagnosis Date   Cervical spine ankylosis    CKD (chronic kidney disease) stage 3, GFR 30-59 ml/min (HCC)    COLONIC POLYPS, HX OF    CORONARY ARTERY DISEASE    a. s/p stent to RCA 2005. b. Stent to Cx 2007. c.  CABG X4 2014; d. LHC 04/19/18 occluded SVG-Circ, DES to in-stent restenosis of circ.    COVID-19 2020   Diverticulosis    DIVERTICULOSIS, COLON    DYSPHAGIA UNSPECIFIED    intermittent   GERD    HEMORRHOIDS    HYPERLIPIDEMIA    HYPERTENSION    HYPERTRIGLYCERIDEMIA    Left carotid artery occlusion    OBESITY    Postoperative atrial fibrillation ([place]) 2014   Pseudoaneurysm of left femoral artery ([place]) 02/16/2022   SLEEP APNEA    on CPAP   Statin intolerance    Tobacco abuse    Vertebral artery occlusion, left    Past Surgical History:  Procedure Laterality Date   ABDOMINAL AORTOGRAM W/LOWER EXTREMITY N/A 02/13/2022   Procedure: ABDOMINAL AORTOGRAM W/LOWER EXTREMITY;  Surgeon: Lorretta Harp, MD;  Location: [place] [facility];  Service: Cardiovascular;  Laterality: N/A;   ABDOMINAL AORTOGRAM W/LOWER EXTREMITY N/A 08/28/2022   Procedure: ABDOMINAL AORTOGRAM W/LOWER EXTREMITY;  Surgeon: Marty Heck, MD;  Location: [place] [facility];  Service: Cardiovascular;  Laterality: N/A;   ACHILLES TENDON REPAIR     BACK SURGERY     lower back   CARDIAC CATHETERIZATION     CORONARY ARTERY BYPASS GRAFT   08/18/2012   Procedure: CORONARY ARTERY BYPASS GRAFTING (CABG);  Surgeon: Gaye Pollack, MD;  Location: [place];  Service: Open Heart Surgery;  Laterality: N/A;  CABG x four;  using left internal mammary artery and right leg greater saphenous vein harvested endoscopically   CORONARY STENT INTERVENTION N/A 04/19/2018   Procedure: CORONARY STENT INTERVENTION;  Surgeon: Lorretta Harp, MD;  Location: [place] [facility];  Service: Cardiovascular;  Laterality: N/A;   ENDARTERECTOMY FEMORAL Left 02/17/2022   Procedure: ENDARTERECTOMY FEMORAL;  Surgeon: Marty Heck, MD;  Location: [place];  Service: Vascular;  Laterality: Left;   INTRAOPERATIVE ARTERIOGRAM Left 02/17/2022   Procedure: INTRA OPERATIVE ARTERIOGRAM;  Surgeon: Marty Heck, MD;  Location: [place];  Service: Vascular;  Laterality: Left;   KNEE ARTHROSCOPY     RIGHT   LEFT HEART CATH AND CORS/GRAFTS ANGIOGRAPHY N/A 04/19/2018   Procedure: LEFT HEART CATH AND CORS/GRAFTS ANGIOGRAPHY;  Surgeon: Lorretta Harp, MD;  Location: [employer] [facility];  Service: Cardiovascular;  Laterality: N/A;   LUMBAR LAMINECTOMY/DECOMPRESSION MICRODISCECTOMY N/A 12/05/2015   Procedure: LUMBAR 2-3, LUMBAR 3-4 DECOMPRESSION ;  Surgeon: Phylliss Bob, MD;  Location: [place];  Service: Orthopedics;  Laterality: N/A;   PATCH ANGIOPLASTY Left 02/17/2022   Procedure: PATCH ANGIOPLASTY Left Common Femoral Artery.;  Surgeon: Marty Heck, MD;  [facility];  Service: Vascular;  Laterality: Left;   PERIPHERAL VASCULAR ATHERECTOMY  02/13/2022   Procedure: PERIPHERAL VASCULAR ATHERECTOMY;  Surgeon: Lorretta Harp, MD;  Location: [place] [facility];  Service: Cardiovascular;;  Rt SFA   PERIPHERAL VASCULAR INTERVENTION Left 08/28/2022   Procedure: PERIPHERAL VASCULAR INTERVENTION;  Surgeon: Marty Heck, MD;  Location: [place] [facility];  Service: Cardiovascular;  Laterality: Left;  SFA/POP   Right long finger extensor tendon repair      THROMBECTOMY FEMORAL ARTERY Left 02/17/2022   Procedure: Repair of Left COMMON FEMORAL PSEUDOANEURYSM.;  Surgeon: Marty Heck, MD;  [facility];  Service: Vascular;  Laterality: Left;   Current Outpatient Medications on File Prior to Visit  Medication Sig Dispense Refill   acetaminophen (TYLENOL) 500 MG tablet Take 1,000 mg by mouth every 6 (six) hours as needed for mild pain.     aspirin EC 81 MG tablet Take 1 tablet (81 mg total) by mouth daily. 90 tablet 3   clopidogrel (PLAVIX) 75 MG tablet Take 1 tablet (75 mg total) by mouth daily. 90 tablet 3   Evolocumab (REPATHA SURECLICK) 062 MG/ML SOAJ Inject 1 mL into the skin every 14 (fourteen) days. 6 mL 3   HYDROmorphone (DILAUDID) 2 MG tablet Take 1 tablet (2 mg total) by mouth every 4 (four) hours as needed for severe pain. 10 tablet 0   losartan-hydrochlorothiazide (HYZAAR) 100-12.5 MG tablet TAKE 1 TABLET BY MOUTH ONCE A DAY 90 tablet 2   nitroGLYCERIN (NITROSTAT) 0.4 MG SL tablet Place 1 tablet (0.4 mg total) under the tongue every 5 (five) minutes as needed for chest pain. 25 tablet 2   pantoprazole (PROTONIX) 40 MG tablet TAKE 1 TABLET BY MOUTH ONCE A DAY 90 tablet 0   Probiotic Product (PROBIOTIC-10 ULTIMATE) CAPS Take 1 capsule by mouth daily.     No current facility-administered medications on file prior to visit.   Allergies  Allergen Reactions   Statins Other (See Comments)    Bone and muscle pain   Crestor [Rosuvastatin]     myalgias   Lipitor [Atorvastatin]     myalgias   Oxycodone Nausea And Vomiting   Hydrocodone Nausea And Vomiting   Social History   Socioeconomic History   Marital status: Married    Spouse name: Not on file   Number of children: Not on file   Years of education: Not on file   Highest education level: Not on file  Occupational History   Not on file  Tobacco Use   Smoking status: Every Day    Packs/day: 1.50    Years: 43.00    Total pack years: 64.50    Types: Cigarettes     Passive exposure: Current (Wife)   Smokeless tobacco: Never   Tobacco comments:    Pt still smoking 1 PPD 09/02/2022 PAP  Vaping Use   Vaping Use: Never used  Substance and Sexual Activity   Alcohol use: Yes    Comment: 1-2 drink occasionally   Drug use: No   Sexual activity: Not on file  Other Topics Concern   Not on file  Social History Narrative   Not on file   Social Determinants of Health   Financial Resource Strain: Not on file  Food Insecurity: Not on file  Transportation Needs: Not on file  Physical Activity: Not on file  Stress: Not on file  Social Connections: Not on file  Intimate Partner Violence: Not on file  Review of Systems  All other systems reviewed and are negative.      Objective:   Physical Exam Vitals reviewed.  Constitutional:      General: He is not in acute distress.    Appearance: Normal appearance. He is well-developed. He is obese. He is not ill-appearing or toxic-appearing.  HENT:     Right Ear: Decreased hearing noted. Tympanic membrane is not injected, scarred, perforated or erythematous.     Left Ear: Decreased hearing noted. Tympanic membrane is perforated. Tympanic membrane is not erythematous or retracted. Tympanic membrane has decreased mobility.     Ears:   Cardiovascular:     Rate and Rhythm: Normal rate and regular rhythm.     Heart sounds: Normal heart sounds.  Pulmonary:     Effort: Pulmonary effort is normal. No respiratory distress.     Breath sounds: Normal breath sounds. No wheezing or rales.  Abdominal:     Palpations: Abdomen is not rigid.  Skin:    Findings: Lesion present. No erythema.  Neurological:     Mental Status: He is alert.           Assessment & Plan:  Other hearing loss of left ear with restricted hearing of right ear - Plan: Ambulatory referral to ENT  Perforated ear drum, left - Plan: Ambulatory referral to ENT  Dysfunction of both eustachian tubes  Acute non-recurrent sinusitis,  unspecified location I believe the patient has a sinus infection causing eustachian tube dysfunction along with a perforated eardrum.  Treat for sinus infection with Augmentin 875 mg twice daily for 10 days.  Consult ENT due to the perforated eardrum with decreased hearing

## 2023-01-01 ENCOUNTER — Ambulatory Visit: Payer: BC Managed Care – PPO | Attending: Physician Assistant | Admitting: Physician Assistant

## 2023-01-01 ENCOUNTER — Encounter: Payer: Self-pay | Admitting: Physician Assistant

## 2023-01-01 VITALS — BP 142/70 | HR 72 | Ht 69.0 in | Wt 210.0 lb

## 2023-01-01 DIAGNOSIS — E785 Hyperlipidemia, unspecified: Secondary | ICD-10-CM

## 2023-01-01 DIAGNOSIS — I10 Essential (primary) hypertension: Secondary | ICD-10-CM | POA: Diagnosis not present

## 2023-01-01 DIAGNOSIS — E782 Mixed hyperlipidemia: Secondary | ICD-10-CM

## 2023-01-01 DIAGNOSIS — Z720 Tobacco use: Secondary | ICD-10-CM

## 2023-01-01 DIAGNOSIS — I999 Unspecified disorder of circulatory system: Secondary | ICD-10-CM | POA: Diagnosis not present

## 2023-01-01 DIAGNOSIS — I2510 Atherosclerotic heart disease of native coronary artery without angina pectoris: Secondary | ICD-10-CM

## 2023-01-01 DIAGNOSIS — N183 Chronic kidney disease, stage 3 unspecified: Secondary | ICD-10-CM

## 2023-01-01 MED ORDER — LOSARTAN POTASSIUM-HCTZ 100-12.5 MG PO TABS: 1.0000 | ORAL_TABLET | Freq: Every day | ORAL | 3 refills | Status: DC

## 2023-01-01 MED ORDER — NITROGLYCERIN 0.4 MG SL SUBL: 0.4000 mg | SUBLINGUAL_TABLET | SUBLINGUAL | 3 refills | Status: AC | PRN

## 2023-01-01 MED ORDER — CLOPIDOGREL BISULFATE 75 MG PO TABS: 75.0000 mg | ORAL_TABLET | Freq: Every day | ORAL | 3 refills | Status: DC

## 2023-01-01 NOTE — Patient Instructions (Signed)
Medication Instructions:   Your physician recommends that you continue on your current medications as directed. Please refer to the Current Medication list given to you today.   *If you need a refill on your cardiac medications before your next appointment, please call your pharmacy*   Lab Work: [place]    If you have labs (blood work) drawn today and your tests are completely normal, you will receive your results only by: [place] (if you have MyChart) OR A paper copy in the mail If you have any lab test that is abnormal or we need to change your treatment, we will call you to review the results.   Testing/Procedures: NONE ORDERED  TODAY      Follow-Up: At [facility], you and your health needs are our priority.  As part of our continuing mission to provide you with exceptional heart care, we have created designated Provider Care Teams.  These Care Teams include your primary Cardiologist (physician) and Advanced Practice Providers (APPs -  Physician Assistants and Nurse Practitioners) who all work together to provide you with the care you need, when you need it.  We recommend signing up for the patient portal called "MyChart".  Sign up information is provided on this After Visit Summary.  MyChart is used to connect with patients for Virtual Visits (Telemedicine).  Patients are able to view lab/test results, encounter notes, upcoming appointments, etc.  Non-urgent messages can be sent to your provider as well.   To learn more about what you can do with MyChart, go to [email or web address].    Your next appointment:   6 month(s)  Provider:   Jenkins Rouge, MD     6 MONTHS WITH VASCULAR  Other Instructions

## 2023-01-01 NOTE — Progress Notes (Addendum)
Office Visit    Patient Name: [name] Date of Encounter: 01/01/2023  PCP:  Donita Brooks, MD   [facility]  Cardiologist:  Charlton Haws, MD  Advanced Practice Provider:  No care team member to display Electrophysiologist:  None   HPI    [name] is a 67 y.o. male with a past medical history of CAD status post PCI of RCA 2005, PCI of CX 2007, CABG x 4 in 2014, PCI of OM 6/19, postop atrial fibrillation (on amiodarone), OSA on CPAP, hypertension, hyperlipidemia, obesity, GERD, CKD stage III, ongoing tobacco abuse and unable to take high doses of statin (now on Repatha) presents today for follow-up visit.  History includes left heart catheterization 04/19/2018 which revealed an occluded SVG to circumflex obtuse marginal.  Had high-grade obtuse marginal branch in-stent restenosis of old stent and a DES was placed open that stented area with good result.  DAPT for minimum of 12 months recommended.  Referred to lipid clinic.  Continues to smoke.  Lung cancer screening with CT -neg 11/2019.  He was seen in the ER 05/01/2021 for chest pain which started when he was climbing through a window and radiating to his back.  No acute EKG changes.  Stable testing and discharged home.  PVD followed by Dr. Gery Pray and Chestine Spore and had common femoral pseudoaneurysm needing OR repair on 02/17/2022.  This was after access for right SFA atherectomy.  ABIs improved from 0.7 range to 0.99 on the right and 0.85 on the left.  Still smoking.  When he was last seen for follow-up his blood pressure was elevated but had not taken his morning medications.  He will be on lifelong DAPT due to high-grade OM branch ISR of the old stent.  New nitroglycerin prescription was called in.  Today, he tells me that his legs are his biggest problem.  He has not had any chest pain or shortness of breath.  Dr. Allyson Sabal worked on his leg and it turned black and blue so then Dr. Lenell Antu and Dr. Chestine Spore put 3  stents in his left leg and "augured" his right leg.  He tells me he did see vascular a few months ago and looked okay at that time.  They did an ultrasound.  Patient is that he has been married for 44 years.  He has chronic neck pain that he deals with and takes Dilaudid as needed.  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  Still working in [employer].  No routine exercise program at the moment.  Wants to get back to walking with his wife.  Reports no shortness of breath nor dyspnea on exertion. Reports no chest pain, pressure, or tightness. No edema, orthopnea, PND. Reports no palpitations.    Past Medical History    Past Medical History:  Diagnosis Date   Cervical spine ankylosis    CKD (chronic kidney disease) stage 3, GFR 30-59 ml/min (HCC)    COLONIC POLYPS, HX OF    CORONARY ARTERY DISEASE    a. s/p stent to RCA 2005. b. Stent to Cx 2007. c.  CABG X4 2014; d. LHC 04/19/18 occluded SVG-Circ, DES to in-stent restenosis of circ.    COVID-19 2020   Diverticulosis    DIVERTICULOSIS, COLON    DYSPHAGIA UNSPECIFIED    intermittent   GERD    HEMORRHOIDS    HYPERLIPIDEMIA    HYPERTENSION    HYPERTRIGLYCERIDEMIA    Left carotid artery  occlusion    OBESITY    Postoperative atrial fibrillation ([place]) 2014   Pseudoaneurysm of left femoral artery ([place]) 02/16/2022   SLEEP APNEA    on CPAP   Statin intolerance    Tobacco abuse    Vertebral artery occlusion, left    Past Surgical History:  Procedure Laterality Date   ABDOMINAL AORTOGRAM W/LOWER EXTREMITY N/A 02/13/2022   Procedure: ABDOMINAL AORTOGRAM W/LOWER EXTREMITY;  Surgeon: Lorretta Harp, MD;  Location: [place] [facility];  Service: Cardiovascular;  Laterality: N/A;   ABDOMINAL AORTOGRAM W/LOWER EXTREMITY N/A 08/28/2022   Procedure: ABDOMINAL AORTOGRAM W/LOWER EXTREMITY;  Surgeon: Marty Heck, MD;  Location: [place] [facility];  Service: Cardiovascular;  Laterality: N/A;   ACHILLES TENDON REPAIR     BACK  SURGERY     lower back   CARDIAC CATHETERIZATION     CORONARY ARTERY BYPASS GRAFT  08/18/2012   Procedure: CORONARY ARTERY BYPASS GRAFTING (CABG);  Surgeon: Gaye Pollack, MD;  Location: [place];  Service: Open Heart Surgery;  Laterality: N/A;  CABG x four;  using left internal mammary artery and right leg greater saphenous vein harvested endoscopically   CORONARY STENT INTERVENTION N/A 04/19/2018   Procedure: CORONARY STENT INTERVENTION;  Surgeon: Lorretta Harp, MD;  Location: [place] [facility];  Service: Cardiovascular;  Laterality: N/A;   ENDARTERECTOMY FEMORAL Left 02/17/2022   Procedure: ENDARTERECTOMY FEMORAL;  Surgeon: Marty Heck, MD;  Location: [place];  Service: Vascular;  Laterality: Left;   INTRAOPERATIVE ARTERIOGRAM Left 02/17/2022   Procedure: INTRA OPERATIVE ARTERIOGRAM;  Surgeon: Marty Heck, MD;  Location: [place];  Service: Vascular;  Laterality: Left;   KNEE ARTHROSCOPY     RIGHT   LEFT HEART CATH AND CORS/GRAFTS ANGIOGRAPHY N/A 04/19/2018   Procedure: LEFT HEART CATH AND CORS/GRAFTS ANGIOGRAPHY;  Surgeon: Lorretta Harp, MD;  Location: [place] [facility];  Service: Cardiovascular;  Laterality: N/A;   LUMBAR LAMINECTOMY/DECOMPRESSION MICRODISCECTOMY N/A 12/05/2015   Procedure: LUMBAR 2-3, LUMBAR 3-4 DECOMPRESSION ;  Surgeon: Phylliss Bob, MD;  Location: [place];  Service: Orthopedics;  Laterality: N/A;   PATCH ANGIOPLASTY Left 02/17/2022   Procedure: PATCH ANGIOPLASTY Left Common Femoral Artery.;  Surgeon: Marty Heck, MD;  Location: [employer];  Service: Vascular;  Laterality: Left;   PERIPHERAL VASCULAR ATHERECTOMY  02/13/2022   Procedure: PERIPHERAL VASCULAR ATHERECTOMY;  Surgeon: Lorretta Harp, MD;  Location: [place] [facility];  Service: Cardiovascular;;  Rt SFA   PERIPHERAL VASCULAR INTERVENTION Left 08/28/2022   Procedure: PERIPHERAL VASCULAR INTERVENTION;  Surgeon: Marty Heck, MD;  Location: [place] [facility];  Service: Cardiovascular;   Laterality: Left;  SFA/POP   Right long finger extensor tendon repair     THROMBECTOMY FEMORAL ARTERY Left 02/17/2022   Procedure: Repair of Left COMMON FEMORAL PSEUDOANEURYSM.;  Surgeon: Marty Heck, MD;  Location: [place];  Service: Vascular;  Laterality: Left;    Allergies  Allergies  Allergen Reactions   Statins Other (See Comments)    Bone and muscle pain   Crestor [Rosuvastatin]     myalgias   Lipitor [Atorvastatin]     myalgias   Oxycodone Nausea And Vomiting   Hydrocodone Nausea And Vomiting    EKGs/Labs/Other Studies Reviewed:   The following studies were reviewed today:  Stress test 09/10/2022    LV perfusion is abnormal. There is no evidence of ischemia. There is evidence of infarction. Defect 1: There is a small defect with moderate reduction in uptake  present in the apical inferior location(s) that is fixed. There is normal wall motion in the defect area. Consistent with infarction.   Left ventricular function is normal. Nuclear stress EF: 64 %. The left ventricular ejection fraction is normal (55-65%). End diastolic cavity size is normal.   Findings are consistent with prior myocardial infarction. The study is low risk.  EKG:  EKG is  ordered today.  The ekg ordered today demonstrates NSR, rate 72 bpm, old RBBB  Recent Labs: 02/18/2022: Platelets 147 05/15/2022: ALT 19 08/28/2022: BUN 26; Creatinine, Ser 1.50; Hemoglobin 15.6; Potassium 4.2; Sodium 138  Recent Lipid Panel    Component Value Date/Time   CHOL 113 05/15/2022 0834   TRIG 143 05/15/2022 0834   HDL 42 05/15/2022 0834   CHOLHDL 2.7 05/15/2022 0834   CHOLHDL 4.3 02/14/2022 0225   VLDL 24 02/14/2022 0225   LDLCALC 46 05/15/2022 0834   LDLDIRECT 77.2 07/10/2009 0852    Home Medications   Current Meds  Medication Sig   acetaminophen (TYLENOL) 500 MG tablet Take 1,000 mg by mouth every 6 (six) hours as needed for mild pain.   aspirin EC 81 MG tablet Take 1 tablet (81 mg total) by mouth  daily.   clopidogrel (PLAVIX) 75 MG tablet Take 1 tablet (75 mg total) by mouth daily.   Evolocumab (REPATHA SURECLICK) XX123456 MG/ML SOAJ Inject 1 mL into the skin every 14 (fourteen) days.   HYDROmorphone (DILAUDID) 2 MG tablet Take 1 tablet (2 mg total) by mouth every 4 (four) hours as needed for severe pain.   losartan-hydrochlorothiazide (HYZAAR) 100-12.5 MG tablet TAKE 1 TABLET BY MOUTH ONCE A DAY   nitroGLYCERIN (NITROSTAT) 0.4 MG SL tablet Place 1 tablet (0.4 mg total) under the tongue every 5 (five) minutes as needed for chest pain.   pantoprazole (PROTONIX) 40 MG tablet Take 1 tablet (40 mg total) by mouth daily.   Probiotic Product (PROBIOTIC-10 ULTIMATE) CAPS Take 1 capsule by mouth daily.     Review of Systems      All other systems reviewed and are otherwise negative except as noted above.  Physical Exam    VS:  BP (!) 142/70   Pulse 72   Ht '5\' 9"'$  (1.753 m)   Wt 210 lb (95.3 kg)   SpO2 97%   BMI 31.01 kg/m  , BMI Body mass index is 31.01 kg/m.  Wt Readings from Last 3 Encounters:  01/01/23 210 lb (95.3 kg)  11/24/22 210 lb (95.3 kg)  11/12/22 212 lb (96.2 kg)     GEN: Well nourished, well developed, in no acute distress. HEENT: normal. Neck: Supple, no JVD, carotid bruits, or masses. Cardiac: RRR, no murmurs, rubs, or gallops. No clubbing, cyanosis, edema.  Radials/PT 2+ and equal bilaterally.  Respiratory:  Respirations regular and unlabored, clear to auscultation bilaterally. GI: Soft, nontender, nondistended. MS: No deformity or atrophy. Skin: Warm and dry, no rash. Neuro:  Strength and sensation are intact. Psych: Normal affect.  Assessment & Plan    CAD/multiple cath/PCI/CABG (last PCI June 2019) -Feels okay today without any chest pain or shortness of breath -Continue current medications which include aspirin 81 mg daily, Plavix 75 mg daily, Repatha 140 mg/ML, nitro as needed  Hypertension -Blood pressure is well-controlled today -Continue current  medication regimen  Hyperlipidemia -Continue Repatha -Most recent LDL 46 -Will be due for repeat lipid panel 04/2023  Tobacco abuse -Cessation advised  PVD -He does follow-up with vascular -No current issues   Disposition: Follow  up 6 month with Jenkins Rouge, MD or APP.  Signed, Elgie Collard, PA-C 01/01/2023, 4:58 PM [facility]

## 2023-01-30 ENCOUNTER — Other Ambulatory Visit (HOSPITAL_COMMUNITY): Payer: Self-pay

## 2023-02-02 ENCOUNTER — Other Ambulatory Visit (HOSPITAL_COMMUNITY): Payer: Self-pay

## 2023-02-02 ENCOUNTER — Telehealth: Payer: Self-pay

## 2023-02-02 NOTE — Telephone Encounter (Signed)
F/U from last week (about p\/a for repatha 140mg /ml.  Per pts plan-No P/A IS REQ for this RX. The next date the rx can be filled is 02/18/23.

## 2023-02-04 NOTE — Telephone Encounter (Signed)
Called and spoke with patient who states he has the Repatha in his possession and has no further questions.

## 2023-02-06 ENCOUNTER — Ambulatory Visit: Payer: BC Managed Care – PPO | Admitting: Family Medicine

## 2023-02-06 ENCOUNTER — Encounter: Payer: Self-pay | Admitting: Family Medicine

## 2023-02-06 VITALS — BP 146/86 | HR 85 | Temp 98.5°F | Ht 69.0 in | Wt 214.0 lb

## 2023-02-06 DIAGNOSIS — L723 Sebaceous cyst: Secondary | ICD-10-CM | POA: Diagnosis not present

## 2023-02-06 MED ORDER — SULFAMETHOXAZOLE-TRIMETHOPRIM 800-160 MG PO TABS: 1.0000 | ORAL_TABLET | Freq: Two times a day (BID) | ORAL | 0 refills | Status: DC

## 2023-02-06 NOTE — Progress Notes (Signed)
Subjective:    Patient ID: [name], male    DOB: [birth year], 67 y.o.   MRN: [id number]  HPI  Patient has an inflamed swollen sebaceous cyst on his posterior left shoulder.  The skin has opened up and is draining a fair amount of foul-smelling fluid however the sac is still visible through the opening and there is still fluctuance in the sac requiring incision and drainage.  Yeah you are good to go Past Medical History:  Diagnosis Date  . Cervical spine ankylosis   . CKD (chronic kidney disease) stage 3, GFR 30-59 ml/min (HCC)   . COLONIC POLYPS, HX OF   . CORONARY ARTERY DISEASE    a. s/p stent to RCA 2005. b. Stent to Cx 2007. c.  CABG X4 2014; d. LHC 04/19/18 occluded SVG-Circ, DES to in-stent restenosis of circ.   [name] COVID-19 2020  . Diverticulosis   . DIVERTICULOSIS, COLON   . DYSPHAGIA UNSPECIFIED    intermittent  . GERD   . HEMORRHOIDS   . HYPERLIPIDEMIA   . HYPERTENSION   . HYPERTRIGLYCERIDEMIA   . Left carotid artery occlusion   . OBESITY   . Postoperative atrial fibrillation (HCC) 2014  . Pseudoaneurysm of left femoral artery (HCC) 02/16/2022  . SLEEP APNEA    on CPAP  . Statin intolerance   . Tobacco abuse   . Vertebral artery occlusion, left    Past Surgical History:  Procedure Laterality Date  . ABDOMINAL AORTOGRAM W/LOWER EXTREMITY N/A 02/13/2022   Procedure: ABDOMINAL AORTOGRAM W/LOWER EXTREMITY;  Surgeon: Runell Gess, MD;  Location: [facility];  Service: Cardiovascular;  Laterality: N/A;  . ABDOMINAL AORTOGRAM W/LOWER EXTREMITY N/A 08/28/2022   Procedure: ABDOMINAL AORTOGRAM W/LOWER EXTREMITY;  Surgeon: Cephus Shelling, MD;  Location: [facility];  Service: Cardiovascular;  Laterality: N/A;  . ACHILLES TENDON REPAIR    . BACK SURGERY     lower back  . CARDIAC CATHETERIZATION    . CORONARY ARTERY BYPASS GRAFT  08/18/2012   Procedure: CORONARY ARTERY BYPASS GRAFTING (CABG);  Surgeon: Alleen Borne, MD;  Location: [facility] OR;   Service: Open Heart Surgery;  Laterality: N/A;  CABG x four;  using left internal mammary artery and right leg greater saphenous vein harvested endoscopically  . CORONARY STENT INTERVENTION N/A 04/19/2018   Procedure: CORONARY STENT INTERVENTION;  Surgeon: Runell Gess, MD;  [facility]: [facility];  Service: Cardiovascular;  Laterality: N/A;  . ENDARTERECTOMY FEMORAL Left 02/17/2022   Procedure: ENDARTERECTOMY FEMORAL;  Surgeon: Cephus Shelling, MD;  Location: [facility] OR;  Service: Vascular;  Laterality: Left;  . INTRAOPERATIVE ARTERIOGRAM Left 02/17/2022   Procedure: INTRA OPERATIVE ARTERIOGRAM;  Surgeon: Cephus Shelling, MD;  Location: [facility] OR;  Service: Vascular;  Laterality: Left;  . KNEE ARTHROSCOPY     RIGHT  . LEFT HEART CATH AND CORS/GRAFTS ANGIOGRAPHY N/A 04/19/2018   Procedure: LEFT HEART CATH AND CORS/GRAFTS ANGIOGRAPHY;  Surgeon: Runell Gess, MD;  [facility]: [facility];  Service: Cardiovascular;  Laterality: N/A;  . LUMBAR LAMINECTOMY/DECOMPRESSION MICRODISCECTOMY N/A 12/05/2015   Procedure: LUMBAR 2-3, LUMBAR 3-4 DECOMPRESSION ;  Surgeon: Estill Bamberg, MD;  Location: [facility] OR;  Service: Orthopedics;  Laterality: N/A;  . PATCH ANGIOPLASTY Left 02/17/2022   Procedure: PATCH ANGIOPLASTY Left Common Femoral Artery.;  Surgeon: Cephus Shelling, MD;  Location: [facility] OR;  Service: Vascular;  Laterality: Left;  . PERIPHERAL VASCULAR ATHERECTOMY  02/13/2022   Procedure: PERIPHERAL VASCULAR ATHERECTOMY;  Surgeon: Runell Gess, MD;  Location: [facility] INVASIVE CV LAB;  Service: Cardiovascular;;  Rt SFA  . PERIPHERAL VASCULAR INTERVENTION Left 08/28/2022   Procedure: PERIPHERAL VASCULAR INTERVENTION;  Surgeon: Cephus Shelling, MD;  Location: [facility] INVASIVE CV LAB;  Service: Cardiovascular;  Laterality: Left;  SFA/POP  . Right long finger extensor tendon repair    . THROMBECTOMY FEMORAL ARTERY Left 02/17/2022   Procedure: Repair of Left COMMON FEMORAL PSEUDOANEURYSM.;  Surgeon:  Cephus Shelling, MD;  Location: [facility] OR;  Service: Vascular;  Laterality: Left;   Current Outpatient Medications on File Prior to Visit  Medication Sig Dispense Refill  . acetaminophen (TYLENOL) 500 MG tablet Take 1,000 mg by mouth every 6 (six) hours as needed for mild pain.    [name] aspirin EC 81 MG tablet Take 1 tablet (81 mg total) by mouth daily. 90 tablet 3  . clopidogrel (PLAVIX) 75 MG tablet Take 1 tablet (75 mg total) by mouth daily. 90 tablet 3  . Evolocumab (REPATHA SURECLICK) 140 MG/ML SOAJ Inject 1 mL into the skin every 14 (fourteen) days. 6 mL 3  . HYDROmorphone (DILAUDID) 2 MG tablet Take 1 tablet (2 mg total) by mouth every 4 (four) hours as needed for severe pain. 10 tablet 0  . losartan-hydrochlorothiazide (HYZAAR) 100-12.5 MG tablet Take 1 tablet by mouth daily. 90 tablet 3  . nitroGLYCERIN (NITROSTAT) 0.4 MG SL tablet Place 1 tablet (0.4 mg total) under the tongue every 5 (five) minutes as needed for chest pain. 25 tablet 3  . pantoprazole (PROTONIX) 40 MG tablet Take 1 tablet (40 mg total) by mouth daily. 90 tablet 1  . Probiotic Product (PROBIOTIC-10 ULTIMATE) CAPS Take 1 capsule by mouth daily.     No current facility-administered medications on file prior to visit.   Allergies  Allergen Reactions  . Statins Other (See Comments)    Bone and muscle pain  . Crestor [Rosuvastatin]     myalgias  . Lipitor [Atorvastatin]     myalgias  . Oxycodone Nausea And Vomiting  . Hydrocodone Nausea And Vomiting   Social History   Socioeconomic History  . Marital status: Married    Spouse name: Not on file  . Number of children: Not on file  . Years of education: Not on file  . Highest education level: Not on file  Occupational History  . Not on file  Tobacco Use  . Smoking status: Every Day    Packs/day: 1.50    Years: 43.00    Additional pack years: 0.00    Total pack years: 64.50    Types: Cigarettes    Passive exposure: Current (Wife)  . Smokeless tobacco: Never   . Tobacco comments:    Pt still smoking 1 PPD 09/02/2022 PAP  Vaping Use  . Vaping Use: Never used  Substance and Sexual Activity  . Alcohol use: Yes    Comment: 1-2 drink occasionally  . Drug use: No  . Sexual activity: Not on file  Other Topics Concern  . Not on file  Social History Narrative  . Not on file   Social Determinants of Health   Financial Resource Strain: Not on file  Food Insecurity: Not on file  Transportation Needs: Not on file  Physical Activity: Not on file  Stress: Not on file  Social Connections: Not on file  Intimate Partner Violence: Not on file      Review of Systems  All other systems reviewed and are negative.  Objective:   Physical Exam Vitals reviewed.  Constitutional:      General: He is not in acute distress.    Appearance: Normal appearance. He is well-developed. He is obese. He is not ill-appearing or toxic-appearing.  Cardiovascular:     Rate and Rhythm: Normal rate and regular rhythm.     Heart sounds: Normal heart sounds.  Pulmonary:     Effort: Pulmonary effort is normal. No respiratory distress.     Breath sounds: Normal breath sounds. No wheezing or rales.  Abdominal:     Palpations: Abdomen is not rigid.  Skin:    Findings: Erythema and lesion present.       Neurological:     Mental Status: He is alert.          Assessment & Plan:  Inflamed sebaceous cyst I anesthetized the skin with 0.1% lidocaine.  Using a #10 blade, I lanced dissectible cyst.  I then grasped the cyst sac with a pair hemostats and bluntly dissected it from the underlying subcutaneous tissue with a scalpel.  I removed the majority of the cyst sac however it was not intact.  I then probed the wound with several Q-tip soaked in peroxide to clean the wound thoroughly and packed the wound with 6 inches of 1/4 inch iodoform gauze.  Wound care was discussed.  Antibiotics were not necessary.  Anticipate 10 to 14 days for healing once the gauze has been  removed

## 2023-02-21 ENCOUNTER — Other Ambulatory Visit: Payer: Self-pay | Admitting: Cardiovascular Disease

## 2023-02-21 DIAGNOSIS — I10 Essential (primary) hypertension: Secondary | ICD-10-CM

## 2023-02-21 DIAGNOSIS — I2510 Atherosclerotic heart disease of native coronary artery without angina pectoris: Secondary | ICD-10-CM

## 2023-02-21 DIAGNOSIS — N183 Chronic kidney disease, stage 3 unspecified: Secondary | ICD-10-CM

## 2023-02-21 DIAGNOSIS — E782 Mixed hyperlipidemia: Secondary | ICD-10-CM

## 2023-03-09 ENCOUNTER — Other Ambulatory Visit (HOSPITAL_COMMUNITY): Payer: Self-pay

## 2023-03-09 ENCOUNTER — Telehealth: Payer: Self-pay

## 2023-03-09 NOTE — Telephone Encounter (Signed)
Pharmacy Patient Advocate Encounter   Received notification from [facility] that prior authorization for REPATHA 140MG /ML is required/requested.   PA submitted on 5.13.24 to (ins) [employer] via CoverMyMeds Key or confirmation # [id number]  Status is pending

## 2023-03-10 NOTE — Telephone Encounter (Signed)
PA has been APPROVED from 03/09/2023-03/08/2024  Approval letter has been attached in patients media.

## 2023-03-24 ENCOUNTER — Encounter: Payer: Self-pay | Admitting: Family Medicine

## 2023-03-25 ENCOUNTER — Encounter: Payer: Self-pay | Admitting: Family Medicine

## 2023-03-25 ENCOUNTER — Ambulatory Visit: Payer: BC Managed Care – PPO | Admitting: Family Medicine

## 2023-03-25 VITALS — BP 148/60 | HR 74 | Temp 98.2°F | Ht 69.0 in | Wt 210.0 lb

## 2023-03-25 DIAGNOSIS — L02412 Cutaneous abscess of left axilla: Secondary | ICD-10-CM | POA: Insufficient documentation

## 2023-03-25 NOTE — Assessment & Plan Note (Signed)
I&D performed on left axillary abscess without complications. I anesthetized the skin with 1% Lidocaine. Using a #10 blade, I lanced the cyst. It was drained of purulent material. I then probed the wound with several Q-tip soaked in peroxide to clean the wound and packed with 1 inch of 1/4 inch iodoform gauze. Wound care was discussed. Anticipate 10 to 14 days for wound healing once the gauze has been removed. He does have a full course of Bactrim 7d at home from 1 month ago that he will take.

## 2023-03-25 NOTE — Progress Notes (Signed)
Acute Office Visit  Subjective:     Patient ID: [name], male    DOB: 07/25/1956, 67 y.o.   MRN: [id number]  Chief Complaint  Patient presents with   Follow-up    bump/boil left side near armpit    HPI Patient is in today for a bump under his left armpit for 1 week that is painful, red, and swollen. It has not been draining. He denies fever, chills, body aches. Has tried nothing.  Review of Systems  All other systems reviewed and are negative.  I & D  Date/Time: 03/25/2023 8:56 AM  Performed by: Park Meo, FNP Authorized by: Kurtis Bushman S, FNP   Consent:    Consent obtained:  Verbal   Consent given by:  Patient   Risks, benefits, and alternatives were discussed: yes     Risks discussed:  Bleeding, incomplete drainage, pain and infection   Alternatives discussed:  No treatment Location:    Type:  Abscess   Size:  1 inch   Location:  Upper extremity   Upper extremity location:  Arm   Arm location:  L upper arm Pre-procedure details:    Skin preparation:  Povidone-iodine Sedation:    Sedation type:  None Anesthesia:    Anesthesia method:  Local infiltration   Local anesthetic:  Lidocaine 1% WITH epi Procedure type:    Complexity:  Simple Procedure details:    Incision types:  Stab incision   Incision depth:  Dermal   Scalpel blade:  10   Wound management:  Probed and deloculated   Drainage:  Bloody and purulent   Drainage amount:  Moderate   Wound treatment:  Wound left open   Packing materials:  1/4 in iodoform gauze Post-procedure details:    Procedure completion:  Tolerated well, no immediate complications  Past Medical History:  Diagnosis Date   Cervical spine ankylosis    CKD (chronic kidney disease) stage 3, GFR 30-59 ml/min (HCC)    COLONIC POLYPS, HX OF    CORONARY ARTERY DISEASE    a. s/p stent to RCA 2005. b. Stent to Cx 2007. c.  CABG X4 2014; d. LHC 04/19/18 occluded SVG-Circ, DES to in-stent restenosis of circ.    COVID-19  2020   Diverticulosis    DIVERTICULOSIS, COLON    DYSPHAGIA UNSPECIFIED    intermittent   GERD    HEMORRHOIDS    HYPERLIPIDEMIA    HYPERTENSION    HYPERTRIGLYCERIDEMIA    Left carotid artery occlusion    OBESITY    Postoperative atrial fibrillation (HCC) 2014   Pseudoaneurysm of left femoral artery (HCC) 02/16/2022   SLEEP APNEA    on CPAP   Statin intolerance    Tobacco abuse    Vertebral artery occlusion, left    Past Surgical History:  Procedure Laterality Date   ABDOMINAL AORTOGRAM W/LOWER EXTREMITY N/A 02/13/2022   Procedure: ABDOMINAL AORTOGRAM W/LOWER EXTREMITY;  Surgeon: Runell Gess, MD;  Location: [facility];  Service: Cardiovascular;  Laterality: N/A;   ABDOMINAL AORTOGRAM W/LOWER EXTREMITY N/A 08/28/2022   Procedure: ABDOMINAL AORTOGRAM W/LOWER EXTREMITY;  Surgeon: Cephus Shelling, MD;  Location: [facility];  Service: Cardiovascular;  Laterality: N/A;   ACHILLES TENDON REPAIR     BACK SURGERY     lower back   CARDIAC CATHETERIZATION     CORONARY ARTERY BYPASS GRAFT  08/18/2012   Procedure: CORONARY ARTERY BYPASS GRAFTING (CABG);  Surgeon: Alleen Borne, MD;  Location: [facility]  OR;  Service: Open Heart Surgery;  Laterality: N/A;  CABG x four;  using left internal mammary artery and right leg greater saphenous vein harvested endoscopically   CORONARY STENT INTERVENTION N/A 04/19/2018   Procedure: CORONARY STENT INTERVENTION;  Surgeon: Runell Gess, MD;  Location: [facility];  Service: Cardiovascular;  Laterality: N/A;   ENDARTERECTOMY FEMORAL Left 02/17/2022   Procedure: ENDARTERECTOMY FEMORAL;  Surgeon: Cephus Shelling, MD;  Location: [facility] OR;  Service: Vascular;  Laterality: Left;   INTRAOPERATIVE ARTERIOGRAM Left 02/17/2022   Procedure: INTRA OPERATIVE ARTERIOGRAM;  Surgeon: Cephus Shelling, MD;  Location: [facility] OR;  Service: Vascular;  Laterality: Left;   KNEE ARTHROSCOPY     RIGHT   LEFT HEART CATH AND CORS/GRAFTS ANGIOGRAPHY  N/A 04/19/2018   Procedure: LEFT HEART CATH AND CORS/GRAFTS ANGIOGRAPHY;  Surgeon: Runell Gess, MD;  Location: [facility];  Service: Cardiovascular;  Laterality: N/A;   LUMBAR LAMINECTOMY/DECOMPRESSION MICRODISCECTOMY N/A 12/05/2015   Procedure: LUMBAR 2-3, LUMBAR 3-4 DECOMPRESSION ;  Surgeon: Estill Bamberg, MD;  Location: [facility] OR;  Service: Orthopedics;  Laterality: N/A;   PATCH ANGIOPLASTY Left 02/17/2022   Procedure: PATCH ANGIOPLASTY Left Common Femoral Artery.;  Surgeon: Cephus Shelling, MD;  Location: [facility] OR;  Service: Vascular;  Laterality: Left;   PERIPHERAL VASCULAR ATHERECTOMY  02/13/2022   Procedure: PERIPHERAL VASCULAR ATHERECTOMY;  Surgeon: Runell Gess, MD;  Location: [facility];  Service: Cardiovascular;;  Rt SFA   PERIPHERAL VASCULAR INTERVENTION Left 08/28/2022   Procedure: PERIPHERAL VASCULAR INTERVENTION;  Surgeon: Cephus Shelling, MD;  Location: [facility];  Service: Cardiovascular;  Laterality: Left;  SFA/POP   Right long finger extensor tendon repair     THROMBECTOMY FEMORAL ARTERY Left 02/17/2022   Procedure: Repair of Left COMMON FEMORAL PSEUDOANEURYSM.;  Surgeon: Cephus Shelling, MD;  Location: [facility] OR;  Service: Vascular;  Laterality: Left;   Current Outpatient Medications on File Prior to Visit  Medication Sig Dispense Refill   acetaminophen (TYLENOL) 500 MG tablet Take 1,000 mg by mouth every 6 (six) hours as needed for mild pain.     aspirin EC 81 MG tablet Take 1 tablet (81 mg total) by mouth daily. 90 tablet 3   clopidogrel (PLAVIX) 75 MG tablet Take 1 tablet (75 mg total) by mouth daily. 90 tablet 3   Evolocumab (REPATHA SURECLICK) 140 MG/ML SOAJ Inject 1 mL into the skin every 14 (fourteen) days. 6 mL 3   HYDROmorphone (DILAUDID) 2 MG tablet Take 1 tablet (2 mg total) by mouth every 4 (four) hours as needed for severe pain. 10 tablet 0   losartan-hydrochlorothiazide (HYZAAR) 100-12.5 MG tablet Take 1 tablet by mouth daily. 90  tablet 3   nitroGLYCERIN (NITROSTAT) 0.4 MG SL tablet Place 1 tablet (0.4 mg total) under the tongue every 5 (five) minutes as needed for chest pain. 25 tablet 3   pantoprazole (PROTONIX) 40 MG tablet Take 1 tablet (40 mg total) by mouth daily. 90 tablet 2   Probiotic Product (PROBIOTIC-10 ULTIMATE) CAPS Take 1 capsule by mouth daily.     sulfamethoxazole-trimethoprim (BACTRIM DS) 800-160 MG tablet Take 1 tablet by mouth 2 (two) times daily. 14 tablet 0   No current facility-administered medications on file prior to visit.   Allergies  Allergen Reactions   Statins Other (See Comments)    Bone and muscle pain   Crestor [Rosuvastatin]     myalgias   Lipitor [Atorvastatin]     myalgias  Oxycodone Nausea And Vomiting   Hydrocodone Nausea And Vomiting        Objective:    BP (!) 148/60   Pulse 74   Temp 98.2 F (36.8 C) (Oral)   Ht 5\' 9"  (1.753 m)   Wt 210 lb (95.3 kg)   SpO2 98%   BMI 31.01 kg/m    Physical Exam Vitals and nursing note reviewed.  Constitutional:      Appearance: Normal appearance. He is normal weight.  HENT:     Head: Normocephalic and atraumatic.  Skin:    General: Skin is warm and dry.     Capillary Refill: Capillary refill takes less than 2 seconds.     Findings: Abscess present.     Comments: Left axilla 1 inch tender, fluctuant, erythematous mass  Neurological:     General: No focal deficit present.     Mental Status: He is alert and oriented to person, place, and time. Mental status is at baseline.  Psychiatric:        Mood and Affect: Mood normal.        Behavior: Behavior normal.        Thought Content: Thought content normal.        Judgment: Judgment normal.     No results found for any visits on 03/25/23.      Assessment & Plan:   Problem List Items Addressed This Visit     Abscess of left axilla - Primary    I&D performed on left axillary abscess without complications. I anesthetized the skin with 1% Lidocaine. Using a #10  blade, I lanced the cyst. It was drained of purulent material. I then probed the wound with several Q-tip soaked in peroxide to clean the wound and packed with 1 inch of 1/4 inch iodoform gauze. Wound care was discussed. Anticipate 10 to 14 days for wound healing once the gauze has been removed. He does have a full course of Bactrim 7d at home from 1 month ago that he will take.        No orders of the defined types were placed in this encounter.   Return if symptoms worsen or fail to improve.  Park Meo, FNP

## 2023-03-31 ENCOUNTER — Ambulatory Visit (HOSPITAL_COMMUNITY)
Admission: RE | Admit: 2023-03-31 | Discharge: 2023-03-31 | Disposition: A | Discharge status: Home / Self Care | Payer: BC Managed Care – PPO | Source: Ambulatory Visit | Attending: Vascular Surgery | Admitting: Vascular Surgery

## 2023-03-31 ENCOUNTER — Ambulatory Visit: Payer: BC Managed Care – PPO | Admitting: Physician Assistant

## 2023-03-31 ENCOUNTER — Ambulatory Visit (INDEPENDENT_AMBULATORY_CARE_PROVIDER_SITE_OTHER)
Admission: RE | Admit: 2023-03-31 | Discharge: 2023-03-31 | Disposition: A | Discharge status: Home / Self Care | Payer: BC Managed Care – PPO | Source: Ambulatory Visit | Attending: Vascular Surgery | Admitting: Vascular Surgery

## 2023-03-31 VITALS — BP 160/82 | HR 77 | Temp 98.0°F | Wt 211.0 lb

## 2023-03-31 DIAGNOSIS — T82868A Thrombosis of vascular prosthetic devices, implants and grafts, initial encounter: Secondary | ICD-10-CM | POA: Diagnosis not present

## 2023-03-31 DIAGNOSIS — I739 Peripheral vascular disease, unspecified: Secondary | ICD-10-CM

## 2023-03-31 LAB — VAS US ABI WITH/WO TBI
Left ABI: 0.34
Right ABI: 0.67

## 2023-03-31 NOTE — Progress Notes (Signed)
Office Note   History of Present Illness   [name] is a 67 y.o. (08-20-1956) male who presents for surveillance of PAD.  He has a history of left SFA and popliteal artery stenting by Dr. Chestine Spore on 08/28/2022.  This was done for critical limb ischemia of the left lower extremity with rest pain.  He also has a history of left common femoral artery pseudoaneurysm repair with endarterectomy and bovine pericardial patch angioplasty on 01/28/2022 by Dr. Chestine Spore.  At his last follow-up with our office he was doing well.  He denied any claudication, rest pain, nonhealing wounds of the lower extremities.  He was taking his Repatha, aspirin, and Plavix.  He was still smoking at least 1ppd.  The patient returns today for follow up.  He continues to smoke.  He endorses some occasional bilateral lower extremity claudication if he "pushes himself", but this does not happen often.  He denies any rest pain or lower extremity wounds.  He occasionally gets leg cramps at night which is relieved quickly with mustard. His leg cramps are not position dependent.  He is still taking his Repatha, aspirin, and Plavix.  Current Outpatient Medications  Medication Sig Dispense Refill   acetaminophen (TYLENOL) 500 MG tablet Take 1,000 mg by mouth every 6 (six) hours as needed for mild pain.     aspirin EC 81 MG tablet Take 1 tablet (81 mg total) by mouth daily. 90 tablet 3   clopidogrel (PLAVIX) 75 MG tablet Take 1 tablet (75 mg total) by mouth daily. 90 tablet 3   Evolocumab (REPATHA SURECLICK) 140 MG/ML SOAJ Inject 1 mL into the skin every 14 (fourteen) days. 6 mL 3   HYDROmorphone (DILAUDID) 2 MG tablet Take 1 tablet (2 mg total) by mouth every 4 (four) hours as needed for severe pain. 10 tablet 0   losartan-hydrochlorothiazide (HYZAAR) 100-12.5 MG tablet Take 1 tablet by mouth daily. 90 tablet 3   nitroGLYCERIN (NITROSTAT) 0.4 MG SL tablet Place 1 tablet (0.4 mg total) under the tongue every 5 (five) minutes as  needed for chest pain. 25 tablet 3   pantoprazole (PROTONIX) 40 MG tablet Take 1 tablet (40 mg total) by mouth daily. 90 tablet 2   Probiotic Product (PROBIOTIC-10 ULTIMATE) CAPS Take 1 capsule by mouth daily.     sulfamethoxazole-trimethoprim (BACTRIM DS) 800-160 MG tablet Take 1 tablet by mouth 2 (two) times daily. 14 tablet 0   No current facility-administered medications for this visit.    REVIEW OF SYSTEMS (negative unless checked):   Cardiac:  []  Chest pain or chest pressure? []  Shortness of breath upon activity? []  Shortness of breath when lying flat? []  Irregular heart rhythm?  Vascular:  [x]  Pain in calf, thigh, or hip brought on by walking? []  Pain in feet at night that wakes you up from your sleep? []  Blood clot in your veins? []  Leg swelling?  Pulmonary:  []  Oxygen at home? []  Productive cough? []  Wheezing?  Neurologic:  []  Sudden weakness in arms or legs? []  Sudden numbness in arms or legs? []  Sudden onset of difficult speaking or slurred speech? []  Temporary loss of vision in one eye? []  Problems with dizziness?  Gastrointestinal:  []  Blood in stool? []  Vomited blood?  Genitourinary:  []  Burning when urinating? []  Blood in urine?  Psychiatric:  []  Major depression  Hematologic:  []  Bleeding problems? []  Problems with blood clotting?  Dermatologic:  []  Rashes or ulcers?  Constitutional:  []  Fever or  chills?  Ear/Nose/Throat:  []  Change in hearing? []  Nose bleeds? []  Sore throat?  Musculoskeletal:  []  Back pain? []  Joint pain? []  Muscle pain?   Physical Examination   Vitals:   03/31/23 1237  BP: (!) 160/82  Pulse: 77  Temp: 98 F (36.7 C)  TempSrc: Temporal  SpO2: 95%  Weight: 211 lb (95.7 kg)   Body mass index is 31.16 kg/m.  General:  WDWN in NAD; vital signs documented above Gait: Not observed HENT: WNL, normocephalic Pulmonary: normal non-labored breathing , without rales, rhonchi,  wheezing Cardiac:  regular Abdomen: soft, NT, no masses Skin: without rashes Vascular Exam/Pulses: 1+ palpable DP pulses bilaterally Extremities: without ischemic changes, without gangrene , without cellulitis; without open wounds;  Musculoskeletal: no muscle wasting or atrophy  Neurologic: A&O X 3;  No focal weakness or paresthesias are detected Psychiatric:  The pt has Normal affect.  Non-Invasive Vascular imaging   ABI (03/31/2023) R:  ABI: 0.67 (1.11),  PT: bi DP: mono TBI:  0.4 L:  ABI: 0.34 (0.76),  PT: mono DP: mono TBI: absent   BLE Arterial Duplex (03/31/2023) RIGHT     PSV cm/sRatioStenosis       Waveform  Comments  +----------+--------+-----+---------------+----------+--------+  CFA Distal138                         biphasic            +----------+--------+-----+---------------+----------+--------+  DFA      130                         biphasic            +----------+--------+-----+---------------+----------+--------+  SFA Prox  111                         biphasic            +----------+--------+-----+---------------+----------+--------+  SFA Mid   527          75-99% stenosisbiphasic            +----------+--------+-----+---------------+----------+--------+  SFA Distal90                          monophasic          +----------+--------+-----+---------------+----------+--------+  POP Prox  132                         monophasic          +----------+--------+-----+---------------+----------+--------+  POP Distal58                          monophasic          +----------+--------+-----+---------------+----------+--------+  ATA Distal39                          monophasic          +----------+--------+-----+---------------+----------+--------+  PTA Distal49                          monophasic          +----------+--------+-----+---------------+----------+--------+         +----------+--------+-----+--------+----------+--------+  LEFT     PSV cm/sRatioStenosisWaveform  Comments  +----------+--------+-----+--------+----------+--------+  CFA [id number]  biphasic            +----------+--------+-----+--------+----------+--------+  SFA Prox  0                              stent     +----------+--------+-----+--------+----------+--------+  SFA Mid   0                              stent     +----------+--------+-----+--------+----------+--------+  SFA Distal0                              stent     +----------+--------+-----+--------+----------+--------+  POP Prox  0                              stent     +----------+--------+-----+--------+----------+--------+  POP Distal22                   monophasic          +----------+--------+-----+--------+----------+--------+  ATA Distal23                   monophasic          +----------+--------+-----+--------+----------+--------+  PTA Distal30                   monophasic          +----------+--------+-----+--------+----------+--------+   Medical Decision Making   [name] is a 67 y.o. male who presents for surveillance of PAD  Based on the patient's vascular studies, his ABIs have decreased significantly since his last visit with our office.  His left ABI has decreased from 0.76 to 0.34.  His right ABI has decreased from 1.11 to 0.67 Bilateral lower extremity arterial duplex demonstrates high-grade stenosis of the right mid SFA with PSV 527 cm/s.  The patient's left SFA and popliteal artery stents are occluded with monophasic distal popliteal and tibial vessel flow The patient denies any sudden onset of rest pain or worsening claudication.  He has no lower extremity wounds.  It seems sometime in the last 6 months he silently occluded his left SFA/popliteal artery stents On exam he has 1+ DP pulses bilaterally Given that the  patient is asymptomatic, no intervention is required.  We will continue close interval follow-up.  We will schedule the patient for a follow-up in 3 months with bilateral lower extremity arterial duplex and ABIs.  Loel Dubonnet PA-C [facility] [place] Office: [phone]  Clinic MD: Steve Rattler

## 2023-04-02 ENCOUNTER — Other Ambulatory Visit: Payer: Self-pay

## 2023-04-02 DIAGNOSIS — I739 Peripheral vascular disease, unspecified: Secondary | ICD-10-CM

## 2023-04-18 ENCOUNTER — Other Ambulatory Visit: Payer: Self-pay | Admitting: Cardiovascular Disease

## 2023-04-18 DIAGNOSIS — E782 Mixed hyperlipidemia: Secondary | ICD-10-CM

## 2023-04-18 DIAGNOSIS — E781 Pure hyperglyceridemia: Secondary | ICD-10-CM

## 2023-04-18 DIAGNOSIS — I739 Peripheral vascular disease, unspecified: Secondary | ICD-10-CM

## 2023-04-18 DIAGNOSIS — I2510 Atherosclerotic heart disease of native coronary artery without angina pectoris: Secondary | ICD-10-CM

## 2023-06-02 ENCOUNTER — Other Ambulatory Visit: Payer: Self-pay | Admitting: Family Medicine

## 2023-06-02 ENCOUNTER — Encounter: Payer: Self-pay | Admitting: Family Medicine

## 2023-06-02 DIAGNOSIS — L989 Disorder of the skin and subcutaneous tissue, unspecified: Secondary | ICD-10-CM

## 2023-07-07 ENCOUNTER — Ambulatory Visit (HOSPITAL_COMMUNITY)
Admission: RE | Admit: 2023-07-07 | Discharge: 2023-07-07 | Disposition: A | Discharge status: Home / Self Care | Payer: BC Managed Care – PPO | Source: Ambulatory Visit | Attending: Vascular Surgery | Admitting: Vascular Surgery

## 2023-07-07 ENCOUNTER — Ambulatory Visit (INDEPENDENT_AMBULATORY_CARE_PROVIDER_SITE_OTHER): Payer: BC Managed Care – PPO | Admitting: Physician Assistant

## 2023-07-07 ENCOUNTER — Ambulatory Visit (INDEPENDENT_AMBULATORY_CARE_PROVIDER_SITE_OTHER)
Admission: RE | Admit: 2023-07-07 | Discharge: 2023-07-07 | Disposition: A | Discharge status: Home / Self Care | Payer: BC Managed Care – PPO | Source: Ambulatory Visit | Attending: Vascular Surgery | Admitting: Vascular Surgery

## 2023-07-07 VITALS — BP 167/76 | HR 72 | Temp 98.6°F | Resp 18 | Ht 69.0 in | Wt 206.6 lb

## 2023-07-07 DIAGNOSIS — I739 Peripheral vascular disease, unspecified: Secondary | ICD-10-CM | POA: Insufficient documentation

## 2023-07-07 DIAGNOSIS — F17200 Nicotine dependence, unspecified, uncomplicated: Secondary | ICD-10-CM

## 2023-07-07 LAB — VAS US ABI WITH/WO TBI
Left ABI: 0.43
Right ABI: 0.83

## 2023-07-07 NOTE — Progress Notes (Unsigned)
Office Note     CC:  follow up Requesting Provider:  Donita Brooks, MD  HPI: [name] is a 67 y.o. ([birth year]) male who presents for routine follow up of PAD. He has a history of left SFA and popliteal artery stenting by Dr. Chestine Spore on 08/28/2022. This was done for critical limb ischemia of the left lower extremity with rest pain. He also has a history of left common femoral artery pseudoaneurysm repair with endarterectomy and bovine pericardial patch angioplasty on 01/28/2022 by Dr. Chestine Spore. Until his recent follow up in June he was without any claudication, rest pain or tissue loss. At his recent June visit he did report some claudication symptoms. No rest pain or tissue loss. His non invasive studies showed significant drop in ABIs bilaterally as well as high grade stenosis of his right SFA and occlusion of his previously placed left SFA/ popliteal stents. Since he was only with very mild symptoms no intervention was indicated but close interval follow up was recommended.  He is here today for close interval follow up. He reports overall he is doing well. He says he still gets some fatigue and tightness in his calves on prolonged ambulation. He admits to not walking much. 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 He denies any pain in his legs at rest or any tissue loss. He continues to smoke 1 ppd. He is compliant with his Aspirin, Repatha and Plavix.   Past Medical History:  Diagnosis Date   Cervical spine ankylosis    CKD (chronic kidney disease) stage 3, GFR 30-59 ml/min (HCC)    COLONIC POLYPS, HX OF    CORONARY ARTERY DISEASE    a. s/p stent to RCA 2005. b. Stent to Cx 2007. c.  CABG X4 2014; d. LHC 04/19/18 occluded SVG-Circ, DES to in-stent restenosis of circ.    COVID-19 2020   Diverticulosis    DIVERTICULOSIS, COLON    DYSPHAGIA UNSPECIFIED    intermittent   GERD    HEMORRHOIDS    HYPERLIPIDEMIA    HYPERTENSION    HYPERTRIGLYCERIDEMIA    Left carotid  artery occlusion    OBESITY    Postoperative atrial fibrillation (HCC) 2014   Pseudoaneurysm of left femoral artery (HCC) 02/16/2022   SLEEP APNEA    on CPAP   Statin intolerance    Tobacco abuse    Vertebral artery occlusion, left     Past Surgical History:  Procedure Laterality Date   ABDOMINAL AORTOGRAM W/LOWER EXTREMITY N/A 02/13/2022   Procedure: ABDOMINAL AORTOGRAM W/LOWER EXTREMITY;  Surgeon: Runell Gess, MD;  Location: [facility];  Service: Cardiovascular;  Laterality: N/A;   ABDOMINAL AORTOGRAM W/LOWER EXTREMITY N/A 08/28/2022   Procedure: ABDOMINAL AORTOGRAM W/LOWER EXTREMITY;  Surgeon: Cephus Shelling, MD;  Location: [facility];  Service: Cardiovascular;  Laterality: N/A;   ACHILLES TENDON REPAIR     BACK SURGERY     lower back   CARDIAC CATHETERIZATION     CORONARY ARTERY BYPASS GRAFT  08/18/2012   Procedure: CORONARY ARTERY BYPASS GRAFTING (CABG);  Surgeon: Alleen Borne, MD;  Location: [facility] OR;  Service: Open Heart Surgery;  Laterality: N/A;  CABG x four;  using left internal mammary artery and right leg greater saphenous vein harvested endoscopically   CORONARY STENT INTERVENTION N/A 04/19/2018   Procedure: CORONARY STENT INTERVENTION;  Surgeon: Runell Gess, MD;  Location: [facility];  Service: Cardiovascular;  Laterality: N/A;   ENDARTERECTOMY FEMORAL  Left 02/17/2022   Procedure: ENDARTERECTOMY FEMORAL;  Surgeon: Cephus Shelling, MD;  Location: [facility] OR;  Service: Vascular;  Laterality: Left;   INTRAOPERATIVE ARTERIOGRAM Left 02/17/2022   Procedure: INTRA OPERATIVE ARTERIOGRAM;  Surgeon: Cephus Shelling, MD;  Location: [facility] OR;  Service: Vascular;  Laterality: Left;   KNEE ARTHROSCOPY     RIGHT   LEFT HEART CATH AND CORS/GRAFTS ANGIOGRAPHY N/A 04/19/2018   Procedure: LEFT HEART CATH AND CORS/GRAFTS ANGIOGRAPHY;  Surgeon: Runell Gess, MD;  Location: [facility];  Service: Cardiovascular;  Laterality: N/A;   LUMBAR  LAMINECTOMY/DECOMPRESSION MICRODISCECTOMY N/A 12/05/2015   Procedure: LUMBAR 2-3, LUMBAR 3-4 DECOMPRESSION ;  Surgeon: Estill Bamberg, MD;  Location: [facility] OR;  Service: Orthopedics;  Laterality: N/A;   PATCH ANGIOPLASTY Left 02/17/2022   Procedure: PATCH ANGIOPLASTY Left Common Femoral Artery.;  Surgeon: Cephus Shelling, MD;  Location: [facility] OR;  Service: Vascular;  Laterality: Left;   PERIPHERAL VASCULAR ATHERECTOMY  02/13/2022   Procedure: PERIPHERAL VASCULAR ATHERECTOMY;  Surgeon: Runell Gess, MD;  Location: [facility];  Service: Cardiovascular;;  Rt SFA   PERIPHERAL VASCULAR INTERVENTION Left 08/28/2022   Procedure: PERIPHERAL VASCULAR INTERVENTION;  Surgeon: Cephus Shelling, MD;  Location: [facility];  Service: Cardiovascular;  Laterality: Left;  SFA/POP   Right long finger extensor tendon repair     THROMBECTOMY FEMORAL ARTERY Left 02/17/2022   Procedure: Repair of Left COMMON FEMORAL PSEUDOANEURYSM.;  Surgeon: Cephus Shelling, MD;  Location: [facility] OR;  Service: Vascular;  Laterality: Left;    Social History   Socioeconomic History   Marital status: Married    Spouse name: Not on file   Number of children: Not on file   Years of education: Not on file   Highest education level: Not on file  Occupational History   Not on file  Tobacco Use   Smoking status: Every Day    Current packs/day: 1.00    Average packs/day: 1 pack/day for 43.0 years (43.0 ttl pk-yrs)    Types: Cigarettes    Passive exposure: Current (Wife)   Smokeless tobacco: Never   Tobacco comments:    Pt still smoking 1 PPD 09/02/2022 PAP  Vaping Use   Vaping status: Never Used  Substance and Sexual Activity   Alcohol use: Yes    Comment: 1-2 drink occasionally   Drug use: No   Sexual activity: Not on file  Other Topics Concern   Not on file  Social History Narrative   Not on file   Social Determinants of Health   Financial Resource Strain: Not on file  Food Insecurity: Not on file   Transportation Needs: Not on file  Physical Activity: Not on file  Stress: Not on file  Social Connections: Unknown (03/11/2022)   Received from [facility]   Social Network    Social Network: Not on file  Intimate Partner Violence: Unknown (01/31/2022)   Received from [facility], [facility]   HITS    Physically Hurt: Not on file    Insult or Talk Down To: Not on file    Threaten Physical Harm: Not on file    Scream or Curse: Not on file    Family History  Problem Relation Age of Onset   Cancer Mother    Heart disease Brother     Current Outpatient Medications  Medication Sig Dispense Refill   acetaminophen (TYLENOL) 500 MG tablet Take 1,000 mg by mouth every 6 (six)  hours as needed for mild pain.     aspirin EC 81 MG tablet Take 1 tablet (81 mg total) by mouth daily. 90 tablet 3   clopidogrel (PLAVIX) 75 MG tablet Take 1 tablet (75 mg total) by mouth daily. 90 tablet 3   HYDROmorphone (DILAUDID) 2 MG tablet Take 1 tablet (2 mg total) by mouth every 4 (four) hours as needed for severe pain. 10 tablet 0   losartan-hydrochlorothiazide (HYZAAR) 100-12.5 MG tablet Take 1 tablet by mouth daily. 90 tablet 3   pantoprazole (PROTONIX) 40 MG tablet Take 1 tablet (40 mg total) by mouth daily. 90 tablet 2   Probiotic Product (PROBIOTIC-10 ULTIMATE) CAPS Take 1 capsule by mouth daily.     REPATHA SURECLICK 140 MG/ML SOAJ INJECT ONE ML INTO THE SKIN EVERY 14 DAYS. 6 mL 3   sulfamethoxazole-trimethoprim (BACTRIM DS) 800-160 MG tablet Take 1 tablet by mouth 2 (two) times daily. 14 tablet 0   nitroGLYCERIN (NITROSTAT) 0.4 MG SL tablet Place 1 tablet (0.4 mg total) under the tongue every 5 (five) minutes as needed for chest pain. 25 tablet 3   No current facility-administered medications for this visit.    Allergies  Allergen Reactions   Statins Other (See Comments)    Bone and muscle pain   Crestor [Rosuvastatin]     myalgias   Lipitor [Atorvastatin]     myalgias    Oxycodone Nausea And Vomiting   Hydrocodone Nausea And Vomiting     REVIEW OF SYSTEMS:  [X]  denotes positive finding, [ ]  denotes negative finding Cardiac  Comments:  Chest pain or chest pressure:    Shortness of breath upon exertion:    Short of breath when lying flat:    Irregular heart rhythm:        Vascular    Pain in calf, thigh, or hip brought on by ambulation:    Pain in feet at night that wakes you up from your sleep:     Blood clot in your veins:    Leg swelling:         Pulmonary    Oxygen at home:    Productive cough:     Wheezing:         Neurologic    Sudden weakness in arms or legs:     Sudden numbness in arms or legs:     Sudden onset of difficulty speaking or slurred speech:    Temporary loss of vision in one eye:     Problems with dizziness:         Gastrointestinal    Blood in stool:     Vomited blood:         Genitourinary    Burning when urinating:     Blood in urine:        Psychiatric    Major depression:         Hematologic    Bleeding problems:    Problems with blood clotting too easily:        Skin    Rashes or ulcers:        Constitutional    Fever or chills:      PHYSICAL EXAMINATION:  Vitals:   07/07/23 1407  BP: (!) 167/76  Pulse: 72  Resp: 18  Temp: 98.6 F (37 C)  TempSrc: Temporal  SpO2: 95%  Weight: 206 lb 9.6 oz (93.7 kg)  Height: 5\' 9"  (1.753 m)    General:  WDWN in NAD; vital signs documented above  Gait: Normal HENT: WNL, normocephalic Pulmonary: normal non-labored breathing , without wheezing Cardiac: regular HR Abdomen: soft Vascular Exam/Pulses: 2+ femoral pulses, Right Dp palpable, no palpable pulses on left foot. Feet warm and well perfused Extremities: without ischemic changes, without Gangrene , without cellulitis; without open wounds;  Musculoskeletal: no muscle wasting or atrophy  Neurologic: A&O X 3 Psychiatric:  The pt has Normal affect.   Non-Invasive Vascular Imaging:    +-------+-----------+-----------+------------+------------+  ABI/TBIToday's ABIToday's TBIPrevious ABIPrevious TBI  +-------+-----------+-----------+------------+------------+  Right 0.83       0.38       0.67        0.4           +-------+-----------+-----------+------------+------------+  Left  0.43       0.21       0.34        absent        +-------+-----------+-----------+------------+------------+   VAS [place] Lower extremity arterial duplex: Summary:  Right: No evidence of stenosis in the right lower extremity. Stenotic velocity seen on previous exam was not seen on today's exam.   Left: Left SFA/politeal stent remains occluded.    ASSESSMENT/PLAN:: 67 y.o. male here for follow up for PAD. He is without rest pain, lifestyle limiting claudication or tissue loss. He does have mild claudication symptoms but this is not interfering with his daily activity.  - ABI is slightly better than his prior study in June - Duplex today shows no reproducibly high velocities in the right lower extremity that were seen on last duplex. Left SFA/ popliteal stents remain occluded as previously seen in June - encourage walking regimen to help promote collaterals - encourage smoking cessation - he will follow up in 6 months with repeat ABI - He knows to call for earlier follow up if he has new or worsening symptoms   Graceann Congress, PA-C [facility] [phone]  Clinic MD:   Steve Rattler

## 2023-07-08 ENCOUNTER — Encounter: Payer: Self-pay | Admitting: Physician Assistant

## 2023-07-09 NOTE — Progress Notes (Signed)
CARDIOLOGY OFFICE NOTE  Date:  07/17/2023    [name] Date of Birth: [birth year] Medical Record #[id number]  PCP:  Donita Brooks, MD  Cardiologist:  Townsend Roger    History of Present Illness: [name] is a 67 y.o. male has a history  of CAD s/p PCI of RCA 2005, PCI of Cx 2007, CABG x 4 in 2014, PCI of OM 03/2018, post-op atrial fib (on amiodarone briefly), OSA on CPAP, hypertension, hyperlipidemia, obesity, GERD, CKD stage III, ongoing tobacco abuse & unable to take high dose statin now on Repatha    Left heart cath on 04/19/18 which revealed an occluded SVG to circumflex obtuse marginal branch. He had high-grade obtuse marginal branch in-stent restenosis of old stent and a DES was placed to open that stented area with good result. DAPT for minimum of 12 months recommended. Referred to the lipid clinic. Continued to smoke. Lung cancer screening CT negative 05/22/22    Seen in ER 05/01/21 for chest pain started when climbing through window and radiated to back R/O no acute ECG changes CT no dissection chronic focal dissection vs ulcer infra renal Ao stable since 11/22/17 emphysema and bilateral RAS  CXR subsegmental left lung atelectasis D/c home  PVD followed by Dr Allyson Sabal and Chestine Spore Had left common femoral pseudoaneurysm needing OR repair on 02/17/22 This is after access For right SFA atherectomy ABI"s improved from 0.7 range to 0.99 on right and 0.85 on left   Chronic neck pain using Dilaudid  Biggest issue is leg pain ABI's 07/16/23 with right ABI 0.82 and left 0.43 Left SFA/popliteal stent occluded on duplex Seen by vascular PA 07/07/23 and no intervention planned Indicated no ulcer or rest pain Still smoking 1 ppd  BP is high today Repeat by me 160/88 mmHg He has a lump in his left hand that needs an [place] He has f/u for his PVD with Dr Chestine Spore   Past Medical History:  Diagnosis Date   Cervical spine ankylosis    CKD (chronic kidney disease) stage 3, GFR 30-59 ml/min  (HCC)    COLONIC POLYPS, HX OF    CORONARY ARTERY DISEASE    a. s/p stent to RCA 2005. b. Stent to Cx 2007. c.  CABG X4 2014; d. LHC 04/19/18 occluded SVG-Circ, DES to in-stent restenosis of circ.    COVID-19 2020   Diverticulosis    DIVERTICULOSIS, COLON    DYSPHAGIA UNSPECIFIED    intermittent   GERD    HEMORRHOIDS    HYPERLIPIDEMIA    HYPERTENSION    HYPERTRIGLYCERIDEMIA    Left carotid artery occlusion    OBESITY    Postoperative atrial fibrillation (HCC) 2014   Pseudoaneurysm of left femoral artery (HCC) 02/16/2022   SLEEP APNEA    on CPAP   Statin intolerance    Tobacco abuse    Vertebral artery occlusion, left     Past Surgical History:  Procedure Laterality Date   ABDOMINAL AORTOGRAM W/LOWER EXTREMITY N/A 02/13/2022   Procedure: ABDOMINAL AORTOGRAM W/LOWER EXTREMITY;  Surgeon: Runell Gess, MD;  Location: [facility];  Service: Cardiovascular;  Laterality: N/A;   ABDOMINAL AORTOGRAM W/LOWER EXTREMITY N/A 08/28/2022   Procedure: ABDOMINAL AORTOGRAM W/LOWER EXTREMITY;  Surgeon: Cephus Shelling, MD;  Location: [facility];  Service: Cardiovascular;  Laterality: N/A;   ACHILLES TENDON REPAIR     BACK SURGERY     lower back   CARDIAC CATHETERIZATION  CORONARY ARTERY BYPASS GRAFT  08/18/2012   Procedure: CORONARY ARTERY BYPASS GRAFTING (CABG);  Surgeon: Alleen Borne, MD;  Location: [facility] OR;  Service: Open Heart Surgery;  Laterality: N/A;  CABG x four;  using left internal mammary artery and right leg greater saphenous vein harvested endoscopically   CORONARY STENT INTERVENTION N/A 04/19/2018   Procedure: CORONARY STENT INTERVENTION;  Surgeon: Runell Gess, MD;  Location: [facility];  Service: Cardiovascular;  Laterality: N/A;   ENDARTERECTOMY FEMORAL Left 02/17/2022   Procedure: ENDARTERECTOMY FEMORAL;  Surgeon: Cephus Shelling, MD;  Location: [facility] OR;  Service: Vascular;  Laterality: Left;   INTRAOPERATIVE ARTERIOGRAM Left 02/17/2022    Procedure: INTRA OPERATIVE ARTERIOGRAM;  Surgeon: Cephus Shelling, MD;  Location: [facility] OR;  Service: Vascular;  Laterality: Left;   KNEE ARTHROSCOPY     RIGHT   LEFT HEART CATH AND CORS/GRAFTS ANGIOGRAPHY N/A 04/19/2018   Procedure: LEFT HEART CATH AND CORS/GRAFTS ANGIOGRAPHY;  Surgeon: Runell Gess, MD;  Location: [facility];  Service: Cardiovascular;  Laterality: N/A;   LUMBAR LAMINECTOMY/DECOMPRESSION MICRODISCECTOMY N/A 12/05/2015   Procedure: LUMBAR 2-3, LUMBAR 3-4 DECOMPRESSION ;  Surgeon: Estill Bamberg, MD;  Location: [facility] OR;  Service: Orthopedics;  Laterality: N/A;   PATCH ANGIOPLASTY Left 02/17/2022   Procedure: PATCH ANGIOPLASTY Left Common Femoral Artery.;  Surgeon: Cephus Shelling, MD;  Location: [facility] OR;  Service: Vascular;  Laterality: Left;   PERIPHERAL VASCULAR ATHERECTOMY  02/13/2022   Procedure: PERIPHERAL VASCULAR ATHERECTOMY;  Surgeon: Runell Gess, MD;  Location: [facility];  Service: Cardiovascular;;  Rt SFA   PERIPHERAL VASCULAR INTERVENTION Left 08/28/2022   Procedure: PERIPHERAL VASCULAR INTERVENTION;  Surgeon: Cephus Shelling, MD;  Location: [facility];  Service: Cardiovascular;  Laterality: Left;  SFA/POP   Right long finger extensor tendon repair     THROMBECTOMY FEMORAL ARTERY Left 02/17/2022   Procedure: Repair of Left COMMON FEMORAL PSEUDOANEURYSM.;  Surgeon: Cephus Shelling, MD;  Location: [facility] OR;  Service: Vascular;  Laterality: Left;     Medications: Current Meds  Medication Sig   acetaminophen (TYLENOL) 500 MG tablet Take 1,000 mg by mouth every 6 (six) hours as needed for mild pain.   aspirin EC 81 MG tablet Take 1 tablet (81 mg total) by mouth daily.   clopidogrel (PLAVIX) 75 MG tablet Take 1 tablet (75 mg total) by mouth daily.   cyanocobalamin (VITAMIN B12) 1000 MCG tablet Take 1,000 mcg by mouth daily.   HYDROmorphone (DILAUDID) 2 MG tablet Take 1 tablet (2 mg total) by mouth every 4 (four) hours as needed for  severe pain.   losartan-hydrochlorothiazide (HYZAAR) 100-12.5 MG tablet Take 1 tablet by mouth daily.   nitroGLYCERIN (NITROSTAT) 0.4 MG SL tablet Place 1 tablet (0.4 mg total) under the tongue every 5 (five) minutes as needed for chest pain.   pantoprazole (PROTONIX) 40 MG tablet Take 1 tablet (40 mg total) by mouth daily.   Probiotic Product (PROBIOTIC-10 ULTIMATE) CAPS Take 1 capsule by mouth daily.   REPATHA SURECLICK 140 MG/ML SOAJ INJECT ONE ML INTO THE SKIN EVERY 14 DAYS.     Allergies: Allergies  Allergen Reactions   Statins Other (See Comments)    Bone and muscle pain   Crestor [Rosuvastatin]     myalgias   Lipitor [Atorvastatin]     myalgias   Oxycodone Nausea And Vomiting   Hydrocodone Nausea And Vomiting    Social History: The patient  reports that he  has been smoking cigarettes. He has a 43 pack-year smoking history. He has been exposed to tobacco smoke. He has never used smokeless tobacco. He reports current alcohol use. He reports that he does not use drugs.   Family History: The patient's family history includes Cancer in his mother; Heart disease in his brother.   Review of Systems: Please see the history of present illness.   All other systems are reviewed and negative.   Physical Exam: VS:  BP (!) 180/80   Pulse 76   Ht 5\' 9"  (1.753 m)   Wt 207 lb (93.9 kg)   BMI 30.57 kg/m  .  BMI Body mass index is 30.57 kg/m.  Wt Readings from Last 3 Encounters:  07/17/23 207 lb (93.9 kg)  07/14/23 206 lb (93.4 kg)  07/07/23 206 lb 9.6 oz (93.7 kg)    Affect appropriate Chronically ill male  HEENT: normal Neck supple with no adenopathy JVP normal right bruits no thyromegaly Lungs COPD exp wheezing  Heart:  S1/S2 no murmur, no rub, gallop or click PMI normal post sternotomy Abdomen: benighn, BS positve, no tenderness, no AAA no bruit.  No HSM or HJR Scar in left femoral area from surgery decreased pulses left pedal No edema Neuro non-focal Skin warm and  dry No muscular weakness    LABORATORY DATA:  EKG:   01/01/23 SR RBBB old IMI  Lab Results  Component Value Date   WBC 6.2 07/10/2023   HGB 15.3 07/10/2023   HCT 45.8 07/10/2023   PLT 194 07/10/2023   GLUCOSE 97 07/10/2023   CHOL 87 07/10/2023   TRIG 118 07/10/2023   HDL 41 07/10/2023   LDLDIRECT 77.2 07/10/2009   LDLCALC 26 07/10/2023   ALT 16 07/10/2023   AST 16 07/10/2023   NA 136 07/10/2023   K 4.5 07/10/2023   CL 102 07/10/2023   CREATININE 1.59 (H) 07/10/2023   BUN 21 07/10/2023   CO2 22 07/10/2023   PSA 2.42 07/10/2023   INR 1.1 02/17/2022   HGBA1C 5.7 (H) 07/10/2023       Other Studies Reviewed Today:  CORONARY STENT INTERVENTION 03/2018  LEFT HEART CATH AND CORS/GRAFTS ANGIOGRAPHY  Conclusion      Mid RCA to Dist RCA lesion is 100% stenosed. Ost LAD to Prox LAD lesion is 100% stenosed. Prox RCA to Mid RCA lesion is 75% stenosed. Prox Cx to Mid Cx lesion is 95% stenosed. Origin lesion is 100% stenosed. A stent was successfully placed. Post intervention, there is a 0% residual stenosis. LV end diastolic pressure is mildly elevated. The left ventricular ejection fraction is 50-55% by visual estimate. The left ventricular systolic function is normal.            Assessment/Plan:  1. CAD - has had multiple caths/PCI/CABG and last was PCI in June of 2019 - to occluded SVG to LCX/OM branch - had high grade OM branch ISR of the old stent - on lifelong DAPT.  Myovue 08/03/20 EF 67% normal no ischemia  seen in ED 05/01/21 with chest pain R/O New nitro called in I suspect he will need left fem pop in near future. He had a non ischemic myovue done 09/10/22 and has no angina  2. HTN - BP high add Norvasc 10 mg daily low sodium diet   3. HLD - continue Repatha LDL 46    4. Tobacco abuse - with emphysema  - he is not ready to stop yet. Update lung cancer screening CT no  cancer on CT done 05/22/22 repeat ordered   5. PVD:  f/u [place] post repair of left  femoral pseudoaneurysm and now occluded left SFA/Popliteal stent with ABI on left 0.43  See above regarding smoking cessation F/U [facility]  right carotid bruit has duplex ordered   Current medicines are reviewed with the patient today.  The patient does not have concerns regarding medicines other than what has been noted above.  The following changes have been made:  See above.  Lung cancer CT F/U [facility] for PVD Dr Chestine Spore    Disposition:   f/u 6 months    Patient is agreeable to this plan and will call if any problems develop in the interim.   Signed: Charlton Haws, MD  07/17/2023 7:59 AM  [facility] [address] [place], [place]  [address] Phone: [phone] Fax: [phone]

## 2023-07-10 ENCOUNTER — Other Ambulatory Visit: Payer: BC Managed Care – PPO

## 2023-07-10 DIAGNOSIS — Z0000 Encounter for general adult medical examination without abnormal findings: Secondary | ICD-10-CM

## 2023-07-11 LAB — CBC WITH DIFFERENTIAL/PLATELET
Absolute Monocytes: 508 {cells}/uL (ref 200–950)
Basophils Absolute: 118 {cells}/uL (ref 0–200)
Basophils Relative: 1.9 %
Eosinophils Absolute: 539 {cells}/uL — ABNORMAL HIGH (ref 15–500)
Eosinophils Relative: 8.7 %
HCT: 45.8 % (ref 38.5–50.0)
Hemoglobin: 15.3 g/dL (ref 13.2–17.1)
Lymphs Abs: 1178 {cells}/uL (ref 850–3900)
MCH: 30.4 pg (ref 27.0–33.0)
MCHC: 33.4 g/dL (ref 32.0–36.0)
MCV: 91.1 fL (ref 80.0–100.0)
MPV: 10 fL (ref 7.5–12.5)
Monocytes Relative: 8.2 %
Neutro Abs: 3856 {cells}/uL (ref 1500–7800)
Neutrophils Relative %: 62.2 %
Platelets: 194 10*3/uL (ref 140–400)
RBC: 5.03 10*6/uL (ref 4.20–5.80)
RDW: 12.4 % (ref 11.0–15.0)
Total Lymphocyte: 19 %
WBC: 6.2 10*3/uL (ref 3.8–10.8)

## 2023-07-11 LAB — LIPID PANEL
Cholesterol: 87 mg/dL (ref <200)
HDL: 41 mg/dL (ref >=40)
LDL Cholesterol (Calc): 26 mg/dL
Non-HDL Cholesterol (Calc): 46 mg/dL (ref <130)
Total CHOL/HDL Ratio: 2.1 (calc) (ref <5.0)
Triglycerides: 118 mg/dL (ref <150)

## 2023-07-11 LAB — COMPLETE METABOLIC PANEL WITH GFR
AG Ratio: 1.6 (calc) (ref 1.0–2.5)
ALT: 16 U/L (ref 9–46)
AST: 16 U/L (ref 10–35)
Albumin: 4.1 g/dL (ref 3.6–5.1)
Alkaline phosphatase (APISO): 55 U/L (ref 35–144)
BUN/Creatinine Ratio: 13 (calc) (ref 6–22)
BUN: 21 mg/dL (ref 7–25)
CO2: 22 mmol/L (ref 20–32)
Calcium: 9.1 mg/dL (ref 8.6–10.3)
Chloride: 102 mmol/L (ref 98–110)
Creat: 1.59 mg/dL — ABNORMAL HIGH (ref 0.70–1.35)
Globulin: 2.5 g/dL (ref 1.9–3.7)
Glucose, Bld: 97 mg/dL (ref 65–99)
Potassium: 4.5 mmol/L (ref 3.5–5.3)
Sodium: 136 mmol/L (ref 135–146)
Total Bilirubin: 0.8 mg/dL (ref 0.2–1.2)
Total Protein: 6.6 g/dL (ref 6.1–8.1)
eGFR: 47 mL/min/{1.73_m2} — ABNORMAL LOW (ref >=60)

## 2023-07-11 LAB — B12 AND FOLATE PANEL
Folate: 10.8 ng/mL
Vitamin B-12: 271 pg/mL (ref 200–1100)

## 2023-07-11 LAB — PSA: PSA: 2.42 ng/mL (ref <=4.00)

## 2023-07-11 LAB — HEMOGLOBIN A1C
Hgb A1c MFr Bld: 5.7 %{Hb} — ABNORMAL HIGH (ref <5.7)
Mean Plasma Glucose: 117 mg/dL
eAG (mmol/L): 6.5 mmol/L

## 2023-07-14 ENCOUNTER — Telehealth: Payer: Self-pay

## 2023-07-14 ENCOUNTER — Ambulatory Visit: Payer: BC Managed Care – PPO | Admitting: Family Medicine

## 2023-07-14 VITALS — BP 128/58 | HR 81 | Temp 98.0°F | Ht 69.0 in | Wt 206.0 lb

## 2023-07-14 DIAGNOSIS — Z23 Encounter for immunization: Secondary | ICD-10-CM | POA: Diagnosis not present

## 2023-07-14 DIAGNOSIS — I2584 Coronary atherosclerosis due to calcified coronary lesion: Secondary | ICD-10-CM

## 2023-07-14 DIAGNOSIS — I2510 Atherosclerotic heart disease of native coronary artery without angina pectoris: Secondary | ICD-10-CM

## 2023-07-14 DIAGNOSIS — Z0000 Encounter for general adult medical examination without abnormal findings: Secondary | ICD-10-CM

## 2023-07-14 DIAGNOSIS — Z0001 Encounter for general adult medical examination with abnormal findings: Secondary | ICD-10-CM

## 2023-07-14 DIAGNOSIS — R0989 Other specified symptoms and signs involving the circulatory and respiratory systems: Secondary | ICD-10-CM

## 2023-07-14 DIAGNOSIS — Z122 Encounter for screening for malignant neoplasm of respiratory organs: Secondary | ICD-10-CM

## 2023-07-14 NOTE — Progress Notes (Signed)
Subjective:    Patient ID: [name], male    DOB: September 27, 1956, 67 y.o.   MRN: [id number]  HPI Patient is a 67 year old patient here today for complete physical exam.  Past medical history significant for coronary artery disease, smoking, and stage III chronic kidney disease.  His colonoscopy is up-to-date have not been performed recently.  He is due for prostate cancer screening.  I reviewed his shot records.  He is due for Pneumovax 23, Shingrix, flu shot, RSV, and a COVID booster.  He agrees to receive the shingles vaccine today.  He has greater than 20-year history of smoking.  He is due for lung Screening.  He has no desire to stop smoking at the present time. Immunization History  Administered Date(s) Administered   Influenza Split 08/23/2012   Influenza, High Dose Seasonal PF 09/07/2019   Pneumococcal Polysaccharide-23 06/28/2019   Td 02/24/1991   Zoster Recombinant(Shingrix) 07/14/2023    Past Medical History:  Diagnosis Date   Cervical spine ankylosis    CKD (chronic kidney disease) stage 3, GFR 30-59 ml/min (HCC)    COLONIC POLYPS, HX OF    CORONARY ARTERY DISEASE    a. s/p stent to RCA 2005. b. Stent to Cx 2007. c.  CABG X4 2014; d. LHC 04/19/18 occluded SVG-Circ, DES to in-stent restenosis of circ.    COVID-19 2020   Diverticulosis    DIVERTICULOSIS, COLON    DYSPHAGIA UNSPECIFIED    intermittent   GERD    HEMORRHOIDS    HYPERLIPIDEMIA    HYPERTENSION    HYPERTRIGLYCERIDEMIA    Left carotid artery occlusion    OBESITY    Postoperative atrial fibrillation (HCC) 2014   Pseudoaneurysm of left femoral artery (HCC) 02/16/2022   SLEEP APNEA    on CPAP   Statin intolerance    Tobacco abuse    Vertebral artery occlusion, left    Past Surgical History:  Procedure Laterality Date   ABDOMINAL AORTOGRAM W/LOWER EXTREMITY N/A 02/13/2022   Procedure: ABDOMINAL AORTOGRAM W/LOWER EXTREMITY;  Surgeon: Runell Gess, MD;  Location: [facility];  Service:  Cardiovascular;  Laterality: N/A;   ABDOMINAL AORTOGRAM W/LOWER EXTREMITY N/A 08/28/2022   Procedure: ABDOMINAL AORTOGRAM W/LOWER EXTREMITY;  Surgeon: Cephus Shelling, MD;  Location: [facility];  Service: Cardiovascular;  Laterality: N/A;   ACHILLES TENDON REPAIR     BACK SURGERY     lower back   CARDIAC CATHETERIZATION     CORONARY ARTERY BYPASS GRAFT  08/18/2012   Procedure: CORONARY ARTERY BYPASS GRAFTING (CABG);  Surgeon: Alleen Borne, MD;  Location[facility] OR;  Service: Open Heart Surgery;  Laterality: N/A;  CABG x four;  using left internal mammary artery and right leg greater saphenous vein harvested endoscopically   CORONARY STENT INTERVENTION N/A 04/19/2018   Procedure: CORONARY STENT INTERVENTION;  Surgeon: Runell Gess, MD;  Location: [facility];  Service: Cardiovascular;  Laterality: N/A;   ENDARTERECTOMY FEMORAL Left 02/17/2022   Procedure: ENDARTERECTOMY FEMORAL;  Surgeon: Cephus Shelling, MD;  Location: [facility] OR;  Service: Vascular;  Laterality: Left;   INTRAOPERATIVE ARTERIOGRAM Left 02/17/2022   Procedure: INTRA OPERATIVE ARTERIOGRAM;  Surgeon: Cephus Shelling, MD;  Location: [facility] OR;  Service: Vascular;  Laterality: Left;   KNEE ARTHROSCOPY     RIGHT   LEFT HEART CATH AND CORS/GRAFTS ANGIOGRAPHY N/A 04/19/2018   Procedure: LEFT HEART CATH AND CORS/GRAFTS ANGIOGRAPHY;  Surgeon: Runell Gess, MD;  Location: [facility] INVASIVE  CV LAB;  Service: Cardiovascular;  Laterality: N/A;   LUMBAR LAMINECTOMY/DECOMPRESSION MICRODISCECTOMY N/A 12/05/2015   Procedure: LUMBAR 2-3, LUMBAR 3-4 DECOMPRESSION ;  Surgeon: Estill Bamberg, MD;  Location: [facility] OR;  Service: Orthopedics;  Laterality: N/A;   PATCH ANGIOPLASTY Left 02/17/2022   Procedure: PATCH ANGIOPLASTY Left Common Femoral Artery.;  Surgeon: Cephus Shelling, MD;  Location: [facility] OR;  Service: Vascular;  Laterality: Left;   PERIPHERAL VASCULAR ATHERECTOMY  02/13/2022   Procedure: PERIPHERAL VASCULAR ATHERECTOMY;   Surgeon: Runell Gess, MD;  Location: [facility];  Service: Cardiovascular;;  Rt SFA   PERIPHERAL VASCULAR INTERVENTION Left 08/28/2022   Procedure: PERIPHERAL VASCULAR INTERVENTION;  Surgeon: Cephus Shelling, MD;  Location: [facility];  Service: Cardiovascular;  Laterality: Left;  SFA/POP   Right long finger extensor tendon repair     THROMBECTOMY FEMORAL ARTERY Left 02/17/2022   Procedure: Repair of Left COMMON FEMORAL PSEUDOANEURYSM.;  Surgeon: Cephus Shelling, MD;  Location: [facility] OR;  Service: Vascular;  Laterality: Left;   Current Outpatient Medications on File Prior to Visit  Medication Sig Dispense Refill   acetaminophen (TYLENOL) 500 MG tablet Take 1,000 mg by mouth every 6 (six) hours as needed for mild pain.     aspirin EC 81 MG tablet Take 1 tablet (81 mg total) by mouth daily. 90 tablet 3   clopidogrel (PLAVIX) 75 MG tablet Take 1 tablet (75 mg total) by mouth daily. 90 tablet 3   HYDROmorphone (DILAUDID) 2 MG tablet Take 1 tablet (2 mg total) by mouth every 4 (four) hours as needed for severe pain. 10 tablet 0   losartan-hydrochlorothiazide (HYZAAR) 100-12.5 MG tablet Take 1 tablet by mouth daily. 90 tablet 3   pantoprazole (PROTONIX) 40 MG tablet Take 1 tablet (40 mg total) by mouth daily. 90 tablet 2   Probiotic Product (PROBIOTIC-10 ULTIMATE) CAPS Take 1 capsule by mouth daily.     REPATHA SURECLICK 140 MG/ML SOAJ INJECT ONE ML INTO THE SKIN EVERY 14 DAYS. 6 mL 3   nitroGLYCERIN (NITROSTAT) 0.4 MG SL tablet Place 1 tablet (0.4 mg total) under the tongue every 5 (five) minutes as needed for chest pain. 25 tablet 3   sulfamethoxazole-trimethoprim (BACTRIM DS) 800-160 MG tablet Take 1 tablet by mouth 2 (two) times daily. (Patient not taking: Reported on 07/14/2023) 14 tablet 0   No current facility-administered medications on file prior to visit.   Allergies  Allergen Reactions   Statins Other (See Comments)    Bone and muscle pain   Crestor  [Rosuvastatin]     myalgias   Lipitor [Atorvastatin]     myalgias   Oxycodone Nausea And Vomiting   Hydrocodone Nausea And Vomiting   Social History   Socioeconomic History   Marital status: Married    Spouse name: Not on file   Number of children: Not on file   Years of education: Not on file   Highest education level: Not on file  Occupational History   Not on file  Tobacco Use   Smoking status: Every Day    Current packs/day: 1.00    Average packs/day: 1 pack/day for 43.0 years (43.0 ttl pk-yrs)    Types: Cigarettes    Passive exposure: Current (Wife)   Smokeless tobacco: Never   Tobacco comments:    Pt still smoking 1 PPD 09/02/2022 PAP  Vaping Use   Vaping status: Never Used  Substance and Sexual Activity   Alcohol use: Yes    Comment:  1-2 drink occasionally   Drug use: No   Sexual activity: Not on file  Other Topics Concern   Not on file  Social History Narrative   Not on file   Social Determinants of Health   Financial Resource Strain: Not on file  Food Insecurity: Not on file  Transportation Needs: Not on file  Physical Activity: Not on file  Stress: Not on file  Social Connections: Unknown (03/11/2022)   Received from [facility]   Social Network    Social Network: Not on file  Intimate Partner Violence: Unknown (01/31/2022)   Received from [facility], [facility]   HITS    Physically Hurt: Not on file    Insult or Talk Down To: Not on file    Threaten Physical Harm: Not on file    Scream or Curse: Not on file      Review of Systems  All other systems reviewed and are negative.      Objective:   Physical Exam Vitals reviewed.  Constitutional:      General: He is not in acute distress.    Appearance: Normal appearance. He is well-developed and normal weight. He is not ill-appearing, toxic-appearing or diaphoretic.  HENT:     Head: Normocephalic and atraumatic.     Right Ear: Tympanic membrane normal.     Left Ear:  Tympanic membrane normal.     Nose: No congestion or rhinorrhea.     Mouth/Throat:     Mouth: Mucous membranes are moist.     Pharynx: Oropharynx is clear. No oropharyngeal exudate or posterior oropharyngeal erythema.  Eyes:     Extraocular Movements: Extraocular movements intact.     Conjunctiva/sclera: Conjunctivae normal.     Pupils: Pupils are equal, round, and reactive to light.  Neck:     Vascular: Carotid bruit present.  Cardiovascular:     Rate and Rhythm: Normal rate and regular rhythm.     Pulses: Normal pulses.     Heart sounds: Normal heart sounds.     No friction rub. No gallop.  Pulmonary:     Effort: Pulmonary effort is normal. No respiratory distress.     Breath sounds: Normal breath sounds. No wheezing or rales.  Abdominal:     General: Bowel sounds are increased. There is no distension.     Palpations: Abdomen is soft. Abdomen is not rigid.     Tenderness: There is no abdominal tenderness. There is no guarding or rebound. Negative signs include Murphy's sign and McBurney's sign.    Musculoskeletal:        General: No swelling, tenderness, deformity or signs of injury.     Right lower leg: No edema.     Left lower leg: No edema.  Skin:    Coloration: Skin is not jaundiced or pale.     Findings: No bruising, erythema, lesion or rash.  Neurological:     General: No focal deficit present.     Mental Status: He is alert and oriented to person, place, and time. Mental status is at baseline.     Cranial Nerves: No cranial nerve deficit.     Sensory: No sensory deficit.     Motor: No weakness.     Coordination: Coordination normal.     Gait: Gait normal.     Deep Tendon Reflexes: Reflexes normal.  Psychiatric:        Mood and Affect: Mood normal.        Behavior: Behavior normal.  Thought Content: Thought content normal.        Judgment: Judgment normal.           Assessment & Plan:  Screening for lung cancer - Plan: CT CHEST LUNG CA SCREEN LOW  DOSE W/O CM  Bruit - Plan: VAS US CAROTID  Need for shingles vaccine - Plan: Varicella-zoster vaccine IM  General medical exam  Coronary artery disease due to calcified coronary lesion Strongly recommended smoking cessation.  Colonoscopy is up-to-date.  Schedule patient for CT scan to screen for lung cancer.  Check a PSA to screen for prostate cancer.  Schedule carotid Dopplers to evaluate right-sided carotid bruit.  Reviewed lab work Lab on 07/10/2023  Component Date Value Ref Range Status   Cholesterol 07/10/2023 87  <200 mg/dL Final   HDL 44/10/270 41  > OR = 40 mg/dL Final   Triglycerides 53/66/4403 118  <150 mg/dL Final   LDL Cholesterol (Calc) 07/10/2023 26  mg/dL (calc) Final   Comment: Reference range: <100 . Desirable range <100 mg/dL for primary prevention;   <70 mg/dL for patients with CHD or diabetic patients  with > or = 2 CHD risk factors. [name] LDL-C is now calculated using the Martin-Hopkins  calculation, which is a validated novel method providing  better accuracy than the Friedewald equation in the  estimation of LDL-C.  Horald Pollen et al. Lenox Ahr. 4742;595(63): 2061-2068  (http://education.[facility].com/faq/FAQ164)    Total CHOL/HDL Ratio 07/10/2023 2.1  <8.7 (calc) Final   Non-HDL Cholesterol (Calc) 07/10/2023 46  <130 mg/dL (calc) Final   Comment: For patients with diabetes plus 1 major ASCVD risk  factor, treating to a non-HDL-C goal of <100 mg/dL  (LDL-C of <56 mg/dL) is considered a therapeutic  option.    Glucose, Bld 07/10/2023 97  65 - 99 mg/dL Final   Comment: .            Fasting reference interval .    BUN 07/10/2023 21  7 - 25 mg/dL Final   Creat 43/32/9518 1.59 (H)  0.70 - 1.35 mg/dL Final   eGFR 84/16/6063 47 (L)  > OR = 60 mL/min/1.30m2 Final   BUN/Creatinine Ratio 07/10/2023 13  6 - 22 (calc) Final   Sodium 07/10/2023 136  135 - 146 mmol/L Final   Potassium 07/10/2023 4.5  3.5 - 5.3 mmol/L Final   Chloride 07/10/2023 102  98 - 110  mmol/L Final   CO2 07/10/2023 22  20 - 32 mmol/L Final   Calcium 07/10/2023 9.1  8.6 - 10.3 mg/dL Final   Total Protein 01/60/1093 6.6  6.1 - 8.1 g/dL Final   Albumin 23/55/7322 4.1  3.6 - 5.1 g/dL Final   Globulin 02/54/2706 2.5  1.9 - 3.7 g/dL (calc) Final   AG Ratio 07/10/2023 1.6  1.0 - 2.5 (calc) Final   Total Bilirubin 07/10/2023 0.8  0.2 - 1.2 mg/dL Final   Alkaline phosphatase (APISO) 07/10/2023 55  35 - 144 U/L Final   AST 07/10/2023 16  10 - 35 U/L Final   ALT 07/10/2023 16  9 - 46 U/L Final   WBC 07/10/2023 6.2  3.8 - 10.8 Thousand/uL Final   RBC 07/10/2023 5.03  4.20 - 5.80 Million/uL Final   Hemoglobin 07/10/2023 15.3  13.2 - 17.1 g/dL Final   HCT 23/76/2831 45.8  38.5 - 50.0 % Final   MCV 07/10/2023 91.1  80.0 - 100.0 fL Final   MCH 07/10/2023 30.4  27.0 - 33.0 pg Final   MCHC 07/10/2023 33.4  32.0 - 36.0 g/dL Final   RDW 44/12/4740 12.4  11.0 - 15.0 % Final   Platelets 07/10/2023 194  140 - 400 Thousand/uL Final   MPV 07/10/2023 10.0  7.5 - 12.5 fL Final   Neutro Abs 07/10/2023 3,856  1,500 - 7,800 cells/uL Final   Lymphs Abs 07/10/2023 1,178  850 - 3,900 cells/uL Final   Absolute Monocytes 07/10/2023 508  200 - 950 cells/uL Final   Eosinophils Absolute 07/10/2023 539 (H)  15 - 500 cells/uL Final   Basophils Absolute 07/10/2023 118  0 - 200 cells/uL Final   Neutrophils Relative % 07/10/2023 62.2  % Final   Total Lymphocyte 07/10/2023 19.0  % Final   Monocytes Relative 07/10/2023 8.2  % Final   Eosinophils Relative 07/10/2023 8.7  % Final   Basophils Relative 07/10/2023 1.9  % Final   Hgb A1c MFr Bld 07/10/2023 5.7 (H)  <5.7 % of total Hgb Final   Comment: For someone without known diabetes, a hemoglobin  A1c value between 5.7% and 6.4% is consistent with prediabetes and should be confirmed with a  follow-up test. . For someone with known diabetes, a value <7% indicates that their diabetes is well controlled. A1c targets should be individualized based on duration  of diabetes, age, comorbid conditions, and other considerations. . This assay result is consistent with an increased risk of diabetes. . Currently, no consensus exists regarding use of hemoglobin A1c for diagnosis of diabetes for children. .    Mean Plasma Glucose 07/10/2023 117  mg/dL Final   eAG (mmol/L) 59/56/3875 6.5  mmol/L Final   Comment: . This test was performed on the Roche cobas c503 platform. Effective 08/04/22, a change in test platforms from the Abbott Architect to the Roche cobas c503 may have shifted HbA1c results compared to historical results. Based on laboratory validation testing conducted at [facility], the Roche platform relative to the Abbott platform had an average increase in HbA1c value of < or = 0.3%. This difference is within accepted  variability established by the [facility]. Note that not all individuals will have had a shift in their results and direct comparisons between historical and current results for testing conducted on different platforms is not recommended.    PSA 07/10/2023 2.42  < OR = 4.00 ng/mL Final   Comment: The total PSA value from this assay system is  standardized against the WHO standard. The test  result will be approximately 20% lower when compared  to the equimolar-standardized total PSA (Beckman  Coulter). Comparison of serial PSA results should be  interpreted with this fact in mind. . This test was performed using the Siemens  chemiluminescent method. Values obtained from  different assay methods cannot be used interchangeably. PSA levels, regardless of value, should not be interpreted as absolute evidence of the presence or absence of disease.    Vitamin B-12 07/10/2023 271  200 - 1,100 pg/mL Final   Comment: . Please Note: Although the reference range for vitamin B12 is [phone] pg/mL, it has been reported that between 5 and 10% of patients with values between 200 and 400 pg/mL  may experience neuropsychiatric and hematologic abnormalities due to occult B12 deficiency; less than 1% of patients with values above 400 pg/mL will have symptoms. .    Folate 07/10/2023 10.8  ng/mL Final   Comment:  Reference Range                            Low:           <3.4                            Borderline:    3.4-5.4                            Normal:        >5.4 .   Hospital Outpatient Visit on 07/07/2023  Component Date Value Ref Range Status   Right ABI 07/07/2023 0.83   Final   Left ABI 07/07/2023 0.43   Final   Cholesterol is within normal range.  Recommended reducing sugar in diet.  Recommended Pneumovax 23, Shingrix, flu shot, COVID booster.  Patient received Shingrix

## 2023-07-14 NOTE — Telephone Encounter (Signed)
Pt states he forgot to ask you about a sx he is having for the last 6 months, at his visit today. Pt states he is having sx of chills after eating supper for the last 6 months. Pt states he will warm up after either going outside or getting under a blanket. Pt asks if you could have any idea what could be causing this?

## 2023-07-15 ENCOUNTER — Other Ambulatory Visit: Payer: Self-pay

## 2023-07-15 DIAGNOSIS — I739 Peripheral vascular disease, unspecified: Secondary | ICD-10-CM

## 2023-07-16 ENCOUNTER — Ambulatory Visit: Payer: BC Managed Care – PPO | Admitting: Cardiovascular Disease

## 2023-07-17 ENCOUNTER — Encounter: Payer: Self-pay | Admitting: Cardiovascular Disease

## 2023-07-17 ENCOUNTER — Other Ambulatory Visit: Payer: BC Managed Care – PPO

## 2023-07-17 ENCOUNTER — Telehealth: Payer: Self-pay

## 2023-07-17 ENCOUNTER — Ambulatory Visit: Payer: BC Managed Care – PPO | Admitting: Cardiovascular Disease

## 2023-07-17 ENCOUNTER — Other Ambulatory Visit: Payer: Self-pay

## 2023-07-17 ENCOUNTER — Ambulatory Visit
Admission: RE | Admit: 2023-07-17 | Discharge: 2023-07-17 | Disposition: A | Discharge status: Home / Self Care | Payer: BC Managed Care – PPO | Source: Ambulatory Visit | Attending: Family Medicine | Admitting: Family Medicine

## 2023-07-17 VITALS — BP 180/80 | HR 76 | Ht 69.0 in | Wt 207.0 lb

## 2023-07-17 DIAGNOSIS — I10 Essential (primary) hypertension: Secondary | ICD-10-CM

## 2023-07-17 DIAGNOSIS — R0989 Other specified symptoms and signs involving the circulatory and respiratory systems: Secondary | ICD-10-CM | POA: Diagnosis present

## 2023-07-17 DIAGNOSIS — I2510 Atherosclerotic heart disease of native coronary artery without angina pectoris: Secondary | ICD-10-CM | POA: Diagnosis present

## 2023-07-17 DIAGNOSIS — Z720 Tobacco use: Secondary | ICD-10-CM

## 2023-07-17 DIAGNOSIS — I739 Peripheral vascular disease, unspecified: Secondary | ICD-10-CM

## 2023-07-17 DIAGNOSIS — E782 Mixed hyperlipidemia: Secondary | ICD-10-CM

## 2023-07-17 MED ORDER — AMLODIPINE BESYLATE 10 MG PO TABS: 10.0000 mg | ORAL_TABLET | Freq: Every day | ORAL | 3 refills | Status: DC

## 2023-07-17 NOTE — Patient Instructions (Signed)
Medication Instructions:  Your physician has recommended you make the following change in your medication:  1-START Amlodipine 10 mg by mouth daily.  *If you need a refill on your cardiac medications before your next appointment, please call your pharmacy*  Lab Work: If you have labs (blood work) drawn today and your tests are completely normal, you will receive your results only by: MyChart Message (if you have MyChart) OR A paper copy in the mail If you have any lab test that is abnormal or we need to change your treatment, we will call you to review the results.  Testing/Procedures: None ordered today.  Follow-Up: At [facility] you and your health needs are our priority.  As part of our continuing mission to provide you with exceptional heart care, we have created designated Provider Care Teams.  These Care Teams include your primary Cardiologist (physician) and Advanced Practice Providers (APPs -  Physician Assistants and Nurse Practitioners) who all work together to provide you with the care you need, when you need it.  We recommend signing up for the patient portal called "MyChart".  Sign up information is provided on this After Visit Summary.  MyChart is used to connect with patients for Virtual Visits (Telemedicine).  Patients are able to view lab/test results, encounter notes, upcoming appointments, etc.  Non-urgent messages can be sent to your provider as well.   To learn more about what you can do with MyChart, go to [email or web address].    Your next appointment:   6 month(s)  Provider:   Charlton Haws, MD

## 2023-07-17 NOTE — Telephone Encounter (Signed)
Spoke w/Susan with [facility] reports that pt has: 89-99% on R-side heart and L-side is occluded and pt left w/no symptoms.   Pcp is aware. Per pcp put order in for referral to Vascular surgery ASAP! And notify pt. Order placed as 'urgent'  [name], call and spoke w/pt's wife regarding report.

## 2023-07-20 ENCOUNTER — Encounter: Payer: Self-pay | Admitting: Surgery

## 2023-07-20 ENCOUNTER — Ambulatory Visit: Payer: BC Managed Care – PPO | Admitting: Surgery

## 2023-07-20 ENCOUNTER — Other Ambulatory Visit: Payer: Self-pay

## 2023-07-20 VITALS — BP 147/79 | HR 68 | Temp 98.2°F | Resp 20 | Ht 69.0 in | Wt 209.3 lb

## 2023-07-20 DIAGNOSIS — I6523 Occlusion and stenosis of bilateral carotid arteries: Secondary | ICD-10-CM

## 2023-07-20 DIAGNOSIS — I70213 Atherosclerosis of native arteries of extremities with intermittent claudication, bilateral legs: Secondary | ICD-10-CM

## 2023-07-20 NOTE — Progress Notes (Signed)
[facility] [place]  Patient name: [name] MRN: [id number] DOB: [birth year] Sex: male   REASON FOR VISIT:    Follow up  HISOTRY OF PRESENT ILLNESS:    [name] is a 67 y.o. male who has undergone the following procedures:  02/17/2022: Repair of left common femoral pseudoaneurysm, left leg angiography, left femoral endarterectomy with bovine patch angioplasty (Dr. Chestine Spore) 08/28/2022: Left SFA and popliteal stent (rest pain, Dr. Chestine Spore) 02/13/2022: Atherectomy, DCB (Dr. Allyson Sabal, claudication)  He recently had a ultrasound that showed his left leg stents to be occluded this appears to be a silent occlusion and that he is able to tolerate his symptoms.  The patient has a known history of a chronic left carotid occlusion, however he was unaware of this.[name]  He recently had a duplex that showed stenosis greater than 80% on the right.  He is sent for further evaluation.  He denies any neurologic symptoms such as numbness or weakness in either extremity, slurred speech, or amaurosis fugax    Patient has a history of coronary artery disease.  He has undergone multiple PCI procedures.  She is a current smoker.  She is on Repatha for hypercholesterolemia.  She is medically managed for hypertension.  PAST MEDICAL HISTORY:   Past Medical History:  Diagnosis Date   Cervical spine ankylosis    CKD (chronic kidney disease) stage 3, GFR 30-59 ml/min (HCC)    COLONIC POLYPS, HX OF    CORONARY ARTERY DISEASE    a. s/p stent to RCA 2005. b. Stent to Cx 2007. c.  CABG X4 2014; d. LHC 04/19/18 occluded SVG-Circ, DES to in-stent restenosis of circ.    COVID-19 2020   Diverticulosis    DIVERTICULOSIS, COLON    DYSPHAGIA UNSPECIFIED    intermittent   GERD    HEMORRHOIDS    HYPERLIPIDEMIA    HYPERTENSION    HYPERTRIGLYCERIDEMIA    Left carotid artery occlusion    OBESITY    Postoperative atrial fibrillation (HCC) 2014    Pseudoaneurysm of left femoral artery (HCC) 02/16/2022   SLEEP APNEA    on CPAP   Statin intolerance    Tobacco abuse    Vertebral artery occlusion, left      FAMILY HISTORY:   Family History  Problem Relation Age of Onset   Cancer Mother    Heart disease Brother     SOCIAL HISTORY:   Social History   Tobacco Use   Smoking status: Every Day    Current packs/day: 1.00    Average packs/day: 1 pack/day for 43.0 years (43.0 ttl pk-yrs)    Types: Cigarettes    Passive exposure: Current (Wife)   Smokeless tobacco: Never   Tobacco comments:    Pt still smoking 1 PPD 09/02/2022 PAP  Substance Use Topics   Alcohol use: Yes    Comment: 1-2 drink occasionally     ALLERGIES:   Allergies  Allergen Reactions   Statins Other (See Comments)    Bone and muscle pain   Crestor [Rosuvastatin]     myalgias   Lipitor [Atorvastatin]     myalgias   Oxycodone Nausea And Vomiting   Hydrocodone Nausea And Vomiting     CURRENT MEDICATIONS:   Current Outpatient Medications  Medication Sig Dispense Refill   acetaminophen (TYLENOL) 500 MG tablet Take 1,000 mg by mouth every 6 (six) hours as needed for mild pain.     amLODipine (NORVASC) 10 MG tablet Take 1 tablet (  10 mg total) by mouth daily. 90 tablet 3   aspirin EC 81 MG tablet Take 1 tablet (81 mg total) by mouth daily. 90 tablet 3   clopidogrel (PLAVIX) 75 MG tablet Take 1 tablet (75 mg total) by mouth daily. 90 tablet 3   cyanocobalamin (VITAMIN B12) 1000 MCG tablet Take 1,000 mcg by mouth daily.     HYDROmorphone (DILAUDID) 2 MG tablet Take 1 tablet (2 mg total) by mouth every 4 (four) hours as needed for severe pain. 10 tablet 0   losartan-hydrochlorothiazide (HYZAAR) 100-12.5 MG tablet Take 1 tablet by mouth daily. 90 tablet 3   nitroGLYCERIN (NITROSTAT) 0.4 MG SL tablet Place 1 tablet (0.4 mg total) under the tongue every 5 (five) minutes as needed for chest pain. 25 tablet 3   pantoprazole (PROTONIX) 40 MG tablet Take 1  tablet (40 mg total) by mouth daily. 90 tablet 2   Probiotic Product (PROBIOTIC-10 ULTIMATE) CAPS Take 1 capsule by mouth daily.     REPATHA SURECLICK 140 MG/ML SOAJ INJECT ONE ML INTO THE SKIN EVERY 14 DAYS. 6 mL 3   No current facility-administered medications for this visit.    REVIEW OF SYSTEMS:   [X]  denotes positive finding, [ ]  denotes negative finding Cardiac  Comments:  Chest pain or chest pressure:    Shortness of breath upon exertion:    Short of breath when lying flat:    Irregular heart rhythm:        Vascular    Pain in calf, thigh, or hip brought on by ambulation: x   Pain in feet at night that wakes you up from your sleep:     Blood clot in your veins:    Leg swelling:         Pulmonary    Oxygen at home:    Productive cough:     Wheezing:         Neurologic    Sudden weakness in arms or legs:     Sudden numbness in arms or legs:     Sudden onset of difficulty speaking or slurred speech:    Temporary loss of vision in one eye:     Problems with dizziness:         Gastrointestinal    Blood in stool:     Vomited blood:         Genitourinary    Burning when urinating:     Blood in urine:        Psychiatric    Major depression:         Hematologic    Bleeding problems:    Problems with blood clotting too easily:        Skin    Rashes or ulcers:        Constitutional    Fever or chills:      PHYSICAL EXAM:   Vitals:   07/20/23 1013  BP: (!) 147/79  Pulse: 68  Resp: 20  Temp: 98.2 F (36.8 C)  TempSrc: Temporal  SpO2: 98%  Weight: 209 lb 4.8 oz (94.9 kg)  Height: 5\' 9"  (1.753 m)    GENERAL: The patient is a well-nourished male, in no acute distress. The vital signs are documented above. CARDIAC: There is a regular rate and rhythm.  VASCULAR: Nonpalpable pedal pulses PULMONARY: Non-labored respirations MUSCULOSKELETAL: There are no major deformities or cyanosis. NEUROLOGIC: No focal weakness or paresthesias are detected. SKIN:  There are no ulcers or rashes noted. PSYCHIATRIC: The patient  has a normal affect.  STUDIES:   I have reviewed the following:  Right Carotid: Velocities in the right ICA are consistent with a 80-99%                 stenosis.   Left Carotid: Evidence consistent with a total occlusion of the left ICA.   Vertebrals:  Bilateral vertebral arteries demonstrate antegrade flow.  Subclavians: Normal flow hemodynamics were seen in bilateral subclavian               arteries.   MEDICAL ISSUES:   PAD: He will keep his follow-up for lower extremity disease as scheduled.  His recent ultrasound showed that his left leg stents have occluded.  It is unknown as to when this occurred.  He is able to tolerate his level of disability and so no intervention will be scheduled  Carotid: The patient has had a occluded left carotid artery for a while as documented from an ultrasound in 2021.  He recently had a follow-up duplex that shows progression on the right side to greater than 80%.  He is asymptomatic.  I discussed getting a CT scan to better define his anatomy and see what the best option for treatment is going to be.  He will get that within the next 1-2 weeks and see me afterwards.    Charlena Cross, MD, FACS [facility] [phone] Pager [phone]

## 2023-07-24 NOTE — Telephone Encounter (Signed)
Order placed #[id number]

## 2023-07-28 ENCOUNTER — Ambulatory Visit
Admission: RE | Admit: 2023-07-28 | Discharge: 2023-07-28 | Disposition: A | Discharge status: Home / Self Care | Payer: BC Managed Care – PPO | Source: Ambulatory Visit | Attending: Family Medicine | Admitting: Family Medicine

## 2023-07-28 DIAGNOSIS — Z122 Encounter for screening for malignant neoplasm of respiratory organs: Secondary | ICD-10-CM

## 2023-07-29 ENCOUNTER — Ambulatory Visit (HOSPITAL_COMMUNITY)
Admission: RE | Admit: 2023-07-29 | Discharge: 2023-07-29 | Disposition: A | Discharge status: Home / Self Care | Payer: BC Managed Care – PPO | Source: Ambulatory Visit | Attending: Surgery | Admitting: Surgery

## 2023-07-29 DIAGNOSIS — I6523 Occlusion and stenosis of bilateral carotid arteries: Secondary | ICD-10-CM | POA: Insufficient documentation

## 2023-07-29 MED ORDER — IOHEXOL 350 MG/ML SOLN
75.0000 mL | Freq: Once | INTRAVENOUS | Status: AC | PRN
  Administered 2023-07-29: 75 mL via INTRAVENOUS

## 2023-08-03 ENCOUNTER — Ambulatory Visit: Payer: BC Managed Care – PPO | Admitting: Surgery

## 2023-08-03 ENCOUNTER — Encounter: Payer: Self-pay | Admitting: Surgery

## 2023-08-03 VITALS — BP 143/77 | HR 77 | Temp 98.2°F | Resp 20 | Ht 69.0 in | Wt 210.0 lb

## 2023-08-03 DIAGNOSIS — I6523 Occlusion and stenosis of bilateral carotid arteries: Secondary | ICD-10-CM | POA: Diagnosis not present

## 2023-08-10 ENCOUNTER — Other Ambulatory Visit: Payer: Self-pay

## 2023-08-10 ENCOUNTER — Encounter (INDEPENDENT_AMBULATORY_CARE_PROVIDER_SITE_OTHER): Payer: Self-pay | Admitting: Otolaryngology

## 2023-08-10 DIAGNOSIS — I6523 Occlusion and stenosis of bilateral carotid arteries: Secondary | ICD-10-CM

## 2023-08-12 ENCOUNTER — Ambulatory Visit (INDEPENDENT_AMBULATORY_CARE_PROVIDER_SITE_OTHER): Payer: BC Managed Care – PPO | Admitting: Otolaryngology

## 2023-08-12 ENCOUNTER — Encounter (INDEPENDENT_AMBULATORY_CARE_PROVIDER_SITE_OTHER): Payer: Self-pay | Admitting: Otolaryngology

## 2023-08-12 ENCOUNTER — Other Ambulatory Visit: Payer: Self-pay | Admitting: Family Medicine

## 2023-08-12 ENCOUNTER — Ambulatory Visit: Payer: Self-pay

## 2023-08-12 VITALS — BP 181/69 | HR 78 | Ht 69.0 in | Wt 211.2 lb

## 2023-08-12 DIAGNOSIS — J329 Chronic sinusitis, unspecified: Secondary | ICD-10-CM

## 2023-08-12 DIAGNOSIS — M545 Low back pain, unspecified: Secondary | ICD-10-CM

## 2023-08-12 DIAGNOSIS — G4733 Obstructive sleep apnea (adult) (pediatric): Secondary | ICD-10-CM | POA: Diagnosis not present

## 2023-08-12 DIAGNOSIS — J343 Hypertrophy of nasal turbinates: Secondary | ICD-10-CM

## 2023-08-12 DIAGNOSIS — R0981 Nasal congestion: Secondary | ICD-10-CM | POA: Diagnosis not present

## 2023-08-12 DIAGNOSIS — Z720 Tobacco use: Secondary | ICD-10-CM | POA: Diagnosis not present

## 2023-08-12 DIAGNOSIS — K118 Other diseases of salivary glands: Secondary | ICD-10-CM | POA: Diagnosis not present

## 2023-08-12 DIAGNOSIS — J3489 Other specified disorders of nose and nasal sinuses: Secondary | ICD-10-CM

## 2023-08-12 DIAGNOSIS — J342 Deviated nasal septum: Secondary | ICD-10-CM

## 2023-08-12 MED ORDER — CETIRIZINE HCL 10 MG PO TABS: 10.0000 mg | ORAL_TABLET | Freq: Every day | ORAL | 11 refills | Status: AC

## 2023-08-12 MED ORDER — FLUTICASONE PROPIONATE 50 MCG/ACT NA SUSP
2.0000 | Freq: Every day | NASAL | 6 refills | Status: DC
Start: 2023-08-12 — End: 2024-03-07

## 2023-08-12 NOTE — Progress Notes (Signed)
ENT CONSULT:  Reason for Consult: left parotid mass   HPI: [name] is an 67 y.o. male with hx of CAD, s/p multiple cardiac stents and CABG, on ASA/Plavix, current smoker, left carotid artery occlusion, scheduled for endarterectomy 09/04/23, hx of OSA on CPAP, here for incidentally noted left parotid mass and chronic nasal congestion/nasal obstruction.   On ASA and Plavix will be on it indefinitely.   Records Reviewed:  Vascular surgery office note 08/03/2023 Dr Myra Gianotti  02/17/2022: Repair of left common femoral pseudoaneurysm, left leg angiography, left femoral endarterectomy with bovine patch angioplasty (Dr. Chestine Spore) 08/28/2022: Left SFA and popliteal stent (rest pain, Dr. Chestine Spore) 02/13/2022: Atherectomy, DCB (Dr. Allyson Sabal, claudication)   He recently had a ultrasound that showed his left leg stents to be occluded this appears to be a silent occlusion and that he is able to tolerate his symptoms.   The patient has a known history of a chronic left carotid occlusion, however he was unaware of this.[name]  He recently had a duplex that showed stenosis greater than 80% on the right.  He is sent for further evaluation.  He denies any neurologic symptoms such as numbness or weakness in either extremity, slurred speech, or amaurosis fugax.  I sent him for a CT scan to better evaluate this.  He is back today for discussions   Patient has a history of coronary artery disease.  He has undergone multiple PCI procedures.  She is a current smoker.  She is on Repatha for hypercholesterolemia.  She is medically managed for hypertension.   Past Medical History:  Diagnosis Date   Cervical spine ankylosis    CKD (chronic kidney disease) stage 3, GFR 30-59 ml/min (HCC)    COLONIC POLYPS, HX OF    CORONARY ARTERY DISEASE    a. s/p stent to RCA 2005. b. Stent to Cx 2007. c.  CABG X4 2014; d. LHC 04/19/18 occluded SVG-Circ, DES to in-stent restenosis of circ.    COVID-19 2020   Diverticulosis    DIVERTICULOSIS,  COLON    DYSPHAGIA UNSPECIFIED    intermittent   GERD    HEMORRHOIDS    HYPERLIPIDEMIA    HYPERTENSION    HYPERTRIGLYCERIDEMIA    Left carotid artery occlusion    OBESITY    Postoperative atrial fibrillation (HCC) 2014   Pseudoaneurysm of left femoral artery (HCC) 02/16/2022   SLEEP APNEA    on CPAP   Statin intolerance    Tobacco abuse    Vertebral artery occlusion, left     Past Surgical History:  Procedure Laterality Date   ABDOMINAL AORTOGRAM W/LOWER EXTREMITY N/A 02/13/2022   Procedure: ABDOMINAL AORTOGRAM W/LOWER EXTREMITY;  Surgeon: Runell Gess, MD;  Location: [facility];  Service: Cardiovascular;  Laterality: N/A;   ABDOMINAL AORTOGRAM W/LOWER EXTREMITY N/A 08/28/2022   Procedure: ABDOMINAL AORTOGRAM W/LOWER EXTREMITY;  Surgeon: Cephus Shelling, MD;  Location: [facility];  Service: Cardiovascular;  Laterality: N/A;   ACHILLES TENDON REPAIR     BACK SURGERY     lower back   CARDIAC CATHETERIZATION     CORONARY ARTERY BYPASS GRAFT  08/18/2012   Procedure: CORONARY ARTERY BYPASS GRAFTING (CABG);  Surgeon: Alleen Borne, MD;  Location[facility];  Service: Open Heart Surgery;  Laterality: N/A;  CABG x four;  using left internal mammary artery and right leg greater saphenous vein harvested endoscopically   CORONARY STENT INTERVENTION N/A 04/19/2018   Procedure: CORONARY STENT INTERVENTION;  Surgeon: Nanetta Batty  J, MD;  Location: [facility];  Service: Cardiovascular;  Laterality: N/A;   ENDARTERECTOMY FEMORAL Left 02/17/2022   Procedure: ENDARTERECTOMY FEMORAL;  Surgeon: Cephus Shelling, MD;  Location: [facility] OR;  Service: Vascular;  Laterality: Left;   INTRAOPERATIVE ARTERIOGRAM Left 02/17/2022   Procedure: INTRA OPERATIVE ARTERIOGRAM;  Surgeon: Cephus Shelling, MD;  Location: [facility] OR;  Service: Vascular;  Laterality: Left;   KNEE ARTHROSCOPY     RIGHT   LEFT HEART CATH AND CORS/GRAFTS ANGIOGRAPHY N/A 04/19/2018   Procedure: LEFT HEART CATH  AND CORS/GRAFTS ANGIOGRAPHY;  Surgeon: Runell Gess, MD;  Location: [facility];  Service: Cardiovascular;  Laterality: N/A;   LUMBAR LAMINECTOMY/DECOMPRESSION MICRODISCECTOMY N/A 12/05/2015   Procedure: LUMBAR 2-3, LUMBAR 3-4 DECOMPRESSION ;  Surgeon: Estill Bamberg, MD;  Location: [facility] OR;  Service: Orthopedics;  Laterality: N/A;   PATCH ANGIOPLASTY Left 02/17/2022   Procedure: PATCH ANGIOPLASTY Left Common Femoral Artery.;  Surgeon: Cephus Shelling, MD;  Location[facility] OR;  Service: Vascular;  Laterality: Left;   PERIPHERAL VASCULAR ATHERECTOMY  02/13/2022   Procedure: PERIPHERAL VASCULAR ATHERECTOMY;  Surgeon: Runell Gess, MD;  Location: [facility];  Service: Cardiovascular;;  Rt SFA   PERIPHERAL VASCULAR INTERVENTION Left 08/28/2022   Procedure: PERIPHERAL VASCULAR INTERVENTION;  Surgeon: Cephus Shelling, MD;  Location: [facility];  Service: Cardiovascular;  Laterality: Left;  SFA/POP   Right long finger extensor tendon repair     THROMBECTOMY FEMORAL ARTERY Left 02/17/2022   Procedure: Repair of Left COMMON FEMORAL PSEUDOANEURYSM.;  Surgeon: Cephus Shelling, MD;  Location: [facility] OR;  Service: Vascular;  Laterality: Left;    Family History  Problem Relation Age of Onset   Cancer Mother    Heart disease Brother     Social History:  reports that he has been smoking cigarettes. He has a 43 pack-year smoking history. He has been exposed to tobacco smoke. He has never used smokeless tobacco. He reports current alcohol use. He reports that he does not use drugs.  Allergies:  Allergies  Allergen Reactions   Statins Other (See Comments)    Bone and muscle pain   Crestor [Rosuvastatin]     myalgias   Lipitor [Atorvastatin]     myalgias   Oxycodone Nausea And Vomiting   Hydrocodone Nausea And Vomiting    Medications: I have reviewed the patient's current medications.  The PMH, PSH, Medications, Allergies, and SH were reviewed and  updated.  ROS: Constitutional: Negative for fever, weight loss and weight gain. Cardiovascular: Negative for chest pain and dyspnea on exertion. Respiratory: Is not experiencing shortness of breath at rest. Gastrointestinal: Negative for nausea and vomiting. Neurological: Negative for headaches. Psychiatric: The patient is not nervous/anxious  Blood pressure (!) 181/69, pulse 78, height 5\' 9"  (1.753 m), weight 211 lb 3.2 oz (95.8 kg), SpO2 98%.  PHYSICAL EXAM:  Exam: General: Well-developed, well-nourished Communication and Voice: raspy Respiratory Respiratory effort: Equal inspiration and expiration without stridor Cardiovascular Peripheral Vascular: Warm extremities with equal color/perfusion Eyes: No nystagmus with equal extraocular motion bilaterally Neuro/Psych/Balance: Patient oriented to person, place, and time; Appropriate mood and affect; Gait is intact with no imbalance; Cranial nerves I-XII are intact Head and Face Inspection: Normocephalic and atraumatic without mass or lesion Palpation: Facial skeleton intact without bony stepoffs Salivary Glands: No mass or tenderness Facial Strength: Facial motility symmetric and full bilaterally ENT Pinna: External ear intact and fully developed External canal: Canal is patent with intact skin  Tympanic Membrane: Clear and mobile External Nose: No scar or anatomic deformity Internal Nose: Septum is deviated to the left and with a large caudal septal spur, significant narrowing of bilateral nasal passages left> right. No polyps, no purulence. Mucosal edema and erythema present. Bilateral inferior turbinate hypertrophy.  Lips, Teeth, and gums: Mucosa and teeth intact and viable TMJ: No pain to palpation with full mobility Oral cavity/oropharynx: No erythema or exudate, no lesions present Nasopharynx: No mass or lesion with intact mucosa Neck Neck and Trachea: Midline trachea without mass or lesion Thyroid: No mass or  nodularity Lymphatics: No lymphadenopathy  Procedure: PROCEDURE NOTE: nasal endoscopy  Preoperative diagnosis: chronic sinusitis symptoms  Postoperative diagnosis: same  Procedure: Diagnostic nasal endoscopy (81191)  Surgeon: Ashok Croon, M.D.  Anesthesia: Topical lidocaine and Afrin  H&P REVIEW: The patient's history and physical were reviewed today prior to procedure. All medications were reviewed and updated as well. Complications: None Condition is stable throughout exam Indications and consent: The patient presents with symptoms of chronic sinusitis not responding to previous therapies. All the risks, benefits, and potential complications were reviewed with the patient preoperatively and informed consent was obtained. The time out was completed with confirmation of the correct procedure.   Procedure: The patient was seated upright in the clinic. Topical lidocaine and Afrin were applied to the nasal cavity. After adequate anesthesia had occurred, the rigid nasal endoscope was passed into the nasal cavity. The nasal mucosa, turbinates, septum, and sinus drainage pathways were visualized bilaterally. This revealed no purulence or significant secretions that might be cultured. There were no polyps or sites of significant inflammation. The mucosa was intact and there was no crusting present. The scope was then slowly withdrawn and the patient tolerated the procedure well. There were no complications or blood loss.   Studies Reviewed: CT Angio  IMPRESSION: 1. 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 A radiographic string sign is present. Minimal attenuated intermittent flow distally with occlusion by the skull base. Distal reconstitution at the left carotid siphon with additional severe stenosis involving the paraclinoid left ICA. 2. 80% atheromatous stenosis about the right carotid bulb. 3. Severe 70% stenosis  involving the proximal right V4 segment just distal to the dural reflection. The vertebral arteries are otherwise widely patent within the neck. 4. 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 ENT referral for further workup and evaluation suggested. 5. Few scattered pulmonary nodules measuring up to 6 mm within the visualized upper lungs. The patient has recently undergone a lung cancer screening chest CT on 07/28/2023. Please refer to this exam for follow-up recommendations regarding these findings.  Assessment/Plan: Encounter Diagnoses  Name Primary?   Mass of left parotid gland Yes   Nasal obstruction    Obstructive sleep apnea    Chronic nasal congestion    Nasal septal deviation    Hypertrophy of both inferior nasal turbinates    Tobacco abuse     Incidentally noted left parotid mass on CT angio neck done on 07/29/2023, I personally reviewed the scan and the lesion appears to be solid located in the mid superficial parotid lobe.  I was not able to feel the lesion on exam today which is likely due to the fact that it is deeper in the superficial lobe of the parotid. - FNA of the left parotid mas -Will likely obtain MRI to better evaluate the size and characteristics of the parotid  tumor if surgery is planned in the future -We discussed imaging findings and prognosis for carotid tumors today, we also discussed management options - He will return after biopsy -on aspirin and Plavix, will need clearance due to multiple comorbidities if parotid surgery is planned in the future  2.  Chronic nasal congestion and nasal obstruction.  History of OSA and CPAP use.  History of smoking for many years.  Nasal endoscopy today with evidence of septal deviation and significant narrowing of bilateral nasal passages as well as inferior turbinate hypertrophy and significant mucosal edema -I discussed exam findings with the patient -I discussed  management options for chronic nasal congestion -Start nasal saline rinses -Start Flonase 2 puffs bilateral nares twice daily -Start daily Zyrtec 10 mg  3.  Septal deviation and inferior turban hypertrophy with symptoms of chronic nasal obstruction, worse on the left -Will consider septo/ITR if he fails medical management above  4.  Chronic tobacco abuse -I spent 5 minutes counseling the patient on benefits of quitting smoking and offered smoking cessation program referral -he is not ready to quit and would like to defer at this time  Thank you for allowing me to participate in the care of this patient. Please do not hesitate to contact me with any questions or concerns.   Ashok Croon, MD Otolaryngology [facility] Phone: [phone] Fax: [phone]    08/12/2023, 8:09 PM

## 2023-08-12 NOTE — Patient Instructions (Addendum)
-   start Flonase and Zyrtec for nasal congestion - [facility] Med Nasal Saline Rinse   - start nasal saline rinses with [employer]Med Bottle available over the counter or online to help with nasal congestion

## 2023-08-14 NOTE — Progress Notes (Unsigned)
Gilmer Mor, DO  Leodis Rains D OK for US guided left parotid mass biopsy.  [address]

## 2023-08-17 ENCOUNTER — Ambulatory Visit: Payer: BC Managed Care – PPO | Admitting: Internal Medicine

## 2023-08-17 ENCOUNTER — Encounter: Payer: Self-pay | Admitting: Internal Medicine

## 2023-08-17 VITALS — BP 124/60 | HR 86 | Ht 69.0 in | Wt 212.8 lb

## 2023-08-17 DIAGNOSIS — G4733 Obstructive sleep apnea (adult) (pediatric): Secondary | ICD-10-CM | POA: Diagnosis not present

## 2023-08-17 DIAGNOSIS — J410 Simple chronic bronchitis: Secondary | ICD-10-CM | POA: Diagnosis not present

## 2023-08-17 NOTE — Patient Instructions (Addendum)
You are doing great with CPAP- we can continue CPAP auto 5-15  Please call if we can help-  and Please, please try to stop smoking

## 2023-08-17 NOTE — Progress Notes (Signed)
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 Complicated by HTN, AFib, CAD, L Carotid Artery Occlusion, Chronic Bronchitis, GERD, CKD3, Cervical Ankylosis, Hypertriglyceridemia, Covid infection  06/2020,  NPSG 07/30/1999- AHI 56/hr, desaturation to 85%, body weight 195 lbs PFT 08/16/12- minimal obstruction, Nl DLCO ===================================================  09/02/22-  66 yoM Smoker ( 1.5 ppd/ 64.5 pkyrs) followed  for Obstructive sleep apnea.(DOT/CDL)  Complicated by HTN, AFib, CAD, L Carotid Artery Occlusion,  PAD/femoral stent, Chronic Bronchitis, GERD, CKD3, Cervical Ankylosis, Hypertriglyceridemia, Covid infection  06/2020,  CPAP- auto 5-15 cwp/ [employer]      AirSense 10 AutoSet  replaced 2023 Download- compliance 100%, AHI 1.2/ hr Body weight today 212 lbs Covid vax-none Flu vax-declines Op 08/28/22- femoral artery angioplasty/ stent  Pt states new CPAP is working really well Download reviewed . 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 Still smoking- no inclination to stop. Says his breathing is "fine, no problem". CT chest low dose screen 05/22/22- IMPRESSION: 1. Lung-RADS 2, benign appearance or behavior. Continue annual screening with low-dose chest CT without contrast in 12 months. 2. Aortic Atherosclerosis (ICD10-I70.0) and Emphysema (ICD10-J43.9).  08/17/23- 67 yoM Smoker ( 1.5 ppd/ 43 pkyrs) followed  for Obstructive sleep apnea.(DOT/CDL)  Complicated by HTN, AFib, CAD/ stent, L Carotid Artery Occlusion,  PAD/femoral stents, Chronic Bronchitis, GERD, CKD3, Cervical Ankylosis, Hypertriglyceridemia, Covid infection  06/2020, L Parotid Mass, Chronic Rhinitis,  CPAP- auto 5-15 cwp/ [employer]      AirSense 10 AutoSet  replaced 2023 Download- compliance 100%, AHI 1.8/hr Body weight today 212 lbs Declined flu vax Pending carotid endarterectomy Pending bx L parotid mass Download reviewed- doing very well. Comfortable with  CPAP- no concerns. CT chest low dose 07/28/23- IMPRESSION: 1. Lung-RADS 2, benign appearance or behavior. Continue annual screening with low-dose chest CT without contrast in 12 months. 2. Aortic Atherosclerosis (ICD10-I70.0) and Emphysema (ICD10-J43.9).  ROS-see HPI Constitutional:   No-   weight loss, night sweats, fevers, chills, fatigue, lassitude. HEENT:   No-  headaches, difficulty swallowing, tooth/dental problems, sore throat,       No-  sneezing, itching, ear ache, nasal congestion, post nasal drip,  CV:  No-   chest pain, orthopnea, PND, swelling in lower extremities, anasarca, dizziness, palpitations Resp: No-   shortness of breath with exertion or at rest.              No-   productive cough,  No non-productive cough,  No- coughing up of blood.              No-   change in color of mucus.  No- wheezing.   Skin: No-   rash or lesions. GI:  No-   heartburn, indigestion, abdominal pain, nausea, vomiting,  GU: . MS:  No-   joint pain or swelling.  Neuro-     nothing unusual Psych:  No- change in mood or affect. No depression or anxiety.  No memory loss.  OBJ- Physical Exam General- Alert, Oriented, Affect-appropriate, Distress- none acute, medium build Skin- rash-none, lesions- none, excoriation- none Lymphadenopathy- none Head- atraumatic, ? Fullness L parotid, not tender            Eyes- Gross vision intact, PERRLA, conjunctivae and secretions clear            Ears- Hearing, canals-normal            Nose- Clear, no-Septal dev, mucus, polyps, erosion, perforation  Throat- Mallampati IV , mucosa clear , drainage- none, tonsils- atrophic Neck- flexible , trachea midline, no stridor , thyroid nl, carotid no bruit Chest - symmetrical excursion , unlabored           Heart/CV- RRR , no murmur , no gallop  , no rub, nl s1 s2                           - JVD- none , edema- none, stasis changes- none, varices- none           Lung- clear to P&A, wheeze- none, cough- none ,  dullness-none, rub- none           Chest wall-  Abd-  Br/ Gen/ Rectal- Not done, not indicated Extrem- cyanosis- none, clubbing, none, atrophy- none, strength- nl Neuro- grossly intact to observation

## 2023-08-25 NOTE — Pre-Procedure Instructions (Signed)
Surgical Instructions   Your procedure is scheduled on Friday, November 8th. Report to [facility] Main Entrance "A" at 05:30 A.M., then check in with the Admitting office. Any questions or running late day of surgery: call [phone]  Questions prior to your surgery date: call [phone], Monday-Friday, 8am-4pm. If you experience any cold or flu symptoms such as cough, fever, chills, shortness of breath, etc. between now and your scheduled surgery, please notify us at the above number.     Remember:  Do not eat or drink after midnight the night before your surgery    Take these medicines the morning of surgery with A SIP OF WATER  amLODipine (NORVASC)  aspirin EC  fluticasone (FLONASE)  pantoprazole (PROTONIX)    May take these medicines IF NEEDED: acetaminophen (TYLENOL)  nitroGLYCERIN (NITROSTAT)- if you have to use this medication prior to surgery, please call one of the above phone numbers and report this to a nurse   Hold Plavix 5 days prior to surgery. Last dose 11/2.  One week prior to surgery, STOP taking any Aleve, Naproxen, Ibuprofen, Motrin, Advil, Goody's, BC's, all herbal medications, fish oil, and non-prescription vitamins.                     Do NOT Smoke (Tobacco/Vaping) for 24 hours prior to your procedure.  If you use a CPAP at night, you may bring your mask/headgear for your overnight stay.   You will be asked to remove any contacts, glasses, piercing's, hearing aid's, dentures/partials prior to surgery. Please bring cases for these items if needed.    Patients discharged the day of surgery will not be allowed to drive home, and someone needs to stay with them for 24 hours.  SURGICAL WAITING ROOM VISITATION Patients may have no more than 2 support people in the waiting area - these visitors may rotate.   Pre-op nurse will coordinate an appropriate time for 1 ADULT support person, who may not rotate, to accompany patient in pre-op.  Children under the  age of 30 must have an adult with them who is not the patient and must remain in the main waiting area with an adult.  If the patient needs to stay at the hospital during part of their recovery, the visitor guidelines for inpatient rooms apply.  Please refer to the [facility] website for the visitor guidelines for any additional information.   If you received a COVID test during your pre-op visit  it is requested that you wear a mask when out in public, stay away from anyone that may not be feeling well and notify your surgeon if you develop symptoms. If you have been in contact with anyone that has tested positive in the last 10 days please notify you surgeon.      Pre-operative CHG Bathing Instructions   You can play a key role in reducing the risk of infection after surgery. Your skin needs to be as free of germs as possible. You can reduce the number of germs on your skin by washing with CHG (chlorhexidine gluconate) soap before surgery. CHG is an antiseptic soap that kills germs and continues to kill germs even after washing.   DO NOT use if you have an allergy to chlorhexidine/CHG or antibacterial soaps. If your skin becomes reddened or irritated, stop using the CHG and notify one of our RNs at [phone].              TAKE A SHOWER THE NIGHT  BEFORE SURGERY AND THE DAY OF SURGERY    Please keep in mind the following:  DO NOT shave, including legs and underarms, 48 hours prior to surgery.   You may shave your face before/day of surgery.  Place clean sheets on your bed the night before surgery Use a clean washcloth (not used since being washed) for each shower. DO NOT sleep with pet's night before surgery.  CHG Shower Instructions:  Wash your face and private area with normal soap. If you choose to wash your hair, wash first with your normal shampoo.  After you use shampoo/soap, rinse your hair and body thoroughly to remove shampoo/soap residue.  Turn the water OFF and apply half  the bottle of CHG soap to a CLEAN washcloth.  Apply CHG soap ONLY FROM YOUR NECK DOWN TO YOUR TOES (washing for 3-5 minutes)  DO NOT use CHG soap on face, private areas, open wounds, or sores.  Pay special attention to the area where your surgery is being performed.  If you are having back surgery, having someone wash your back for you may be helpful. Wait 2 minutes after CHG soap is applied, then you may rinse off the CHG soap.  Pat dry with a clean towel  Put on clean pajamas    Additional instructions for the day of surgery: DO NOT APPLY any lotions, deodorants, cologne, or perfumes.   Do not wear jewelry or makeup Do not wear nail polish, gel polish, artificial nails, or any other type of covering on natural nails (fingers and toes) Do not bring valuables to the hospital. [facility] is not responsible for valuables/personal belongings. Put on clean/comfortable clothes.  Please brush your teeth.  Ask your nurse before applying any prescription medications to the skin.

## 2023-08-26 ENCOUNTER — Encounter (HOSPITAL_COMMUNITY)
Admission: RE | Admit: 2023-08-26 | Discharge: 2023-08-26 | Disposition: A | Discharge status: Home / Self Care | Payer: BC Managed Care – PPO | Source: Ambulatory Visit | Attending: Surgery | Admitting: Surgery

## 2023-08-26 ENCOUNTER — Other Ambulatory Visit: Payer: Self-pay

## 2023-08-26 ENCOUNTER — Other Ambulatory Visit: Payer: Self-pay | Admitting: Radiology

## 2023-08-26 ENCOUNTER — Encounter (HOSPITAL_COMMUNITY): Payer: Self-pay

## 2023-08-26 VITALS — BP 144/66 | HR 87 | Temp 98.3°F | Resp 17 | Ht 69.0 in | Wt 211.8 lb

## 2023-08-26 DIAGNOSIS — I6523 Occlusion and stenosis of bilateral carotid arteries: Secondary | ICD-10-CM | POA: Insufficient documentation

## 2023-08-26 DIAGNOSIS — Z01812 Encounter for preprocedural laboratory examination: Secondary | ICD-10-CM | POA: Diagnosis present

## 2023-08-26 DIAGNOSIS — Z01818 Encounter for other preprocedural examination: Secondary | ICD-10-CM

## 2023-08-26 LAB — COMPREHENSIVE METABOLIC PANEL
ALT: 22 U/L (ref 0–44)
AST: 22 U/L (ref 15–41)
Albumin: 3.8 g/dL (ref 3.5–5.0)
Alkaline Phosphatase: 44 U/L (ref 38–126)
Anion gap: 11 (ref 5–15)
BUN: 22 mg/dL (ref 8–23)
CO2: 23 mmol/L (ref 22–32)
Calcium: 9.5 mg/dL (ref 8.9–10.3)
Chloride: 100 mmol/L (ref 98–111)
Creatinine, Ser: 1.69 mg/dL — ABNORMAL HIGH (ref 0.61–1.24)
GFR, Estimated: 43 mL/min — ABNORMAL LOW (ref >60)
Glucose, Bld: 132 mg/dL — ABNORMAL HIGH (ref 70–99)
Potassium: 3.7 mmol/L (ref 3.5–5.1)
Sodium: 134 mmol/L — ABNORMAL LOW (ref 135–145)
Total Bilirubin: 0.5 mg/dL (ref 0.3–1.2)
Total Protein: 6.8 g/dL (ref 6.5–8.1)

## 2023-08-26 LAB — URINALYSIS, ROUTINE W REFLEX MICROSCOPIC
Bilirubin Urine: NEGATIVE
Glucose, UA: NEGATIVE mg/dL
Hgb urine dipstick: NEGATIVE
Ketones, ur: NEGATIVE mg/dL
Leukocytes,Ua: NEGATIVE
Nitrite: NEGATIVE
Protein, ur: NEGATIVE mg/dL
Specific Gravity, Urine: 1.012 (ref 1.005–1.030)
pH: 6 (ref 5.0–8.0)

## 2023-08-26 LAB — CBC
HCT: 41.3 % (ref 39.0–52.0)
Hemoglobin: 14 g/dL (ref 13.0–17.0)
MCH: 30.3 pg (ref 26.0–34.0)
MCHC: 33.9 g/dL (ref 30.0–36.0)
MCV: 89.4 fL (ref 80.0–100.0)
Platelets: 205 10*3/uL (ref 150–400)
RBC: 4.62 MIL/uL (ref 4.22–5.81)
RDW: 11.9 % (ref 11.5–15.5)
WBC: 8 10*3/uL (ref 4.0–10.5)
nRBC: 0 % (ref 0.0–0.2)

## 2023-08-26 LAB — TYPE AND SCREEN
ABO/RH(D): A POS
Antibody Screen: NEGATIVE

## 2023-08-26 LAB — PROTIME-INR
INR: 1 (ref 0.8–1.2)
Prothrombin Time: 13.1 s (ref 11.4–15.2)

## 2023-08-26 LAB — SURGICAL PCR SCREEN
MRSA, PCR: NEGATIVE
Staphylococcus aureus: NEGATIVE

## 2023-08-26 LAB — APTT: aPTT: 30 s (ref 24–36)

## 2023-08-26 NOTE — Progress Notes (Signed)
PCP - Dr. Lynnea Ferrier Cardiologist - Dr. Charlton Haws  PPM/ICD - denies   Chest x-ray - 05/01/21 EKG - 01/01/23 Stress Test - 09/10/22 ECHO - denies Cardiac Cath - 08/03/12  Sleep Study - OSA+ CPAP - nightly, unsure of pressure settings  DM- denies  Blood Thinner Instructions: Hold Plavix 5 days. Last dose 11/2 Aspirin Instructions: continue, including DOS  ERAS Protcol - no, NPO   COVID TEST- n/a   Anesthesia review: yes, cardiac hx  Patient denies shortness of breath, fever, cough and chest pain at PAT appointment   All instructions explained to the patient, with a verbal understanding of the material. Patient agrees to go over the instructions while at home for a better understanding.  The opportunity to ask questions was provided.

## 2023-08-27 ENCOUNTER — Ambulatory Visit (HOSPITAL_COMMUNITY)
Admission: RE | Admit: 2023-08-27 | Discharge: 2023-08-27 | Disposition: A | Discharge status: Home / Self Care | Payer: BC Managed Care – PPO | Source: Ambulatory Visit | Attending: Otolaryngology | Admitting: Otolaryngology

## 2023-08-27 ENCOUNTER — Encounter (HOSPITAL_COMMUNITY): Payer: Self-pay

## 2023-08-27 DIAGNOSIS — K118 Other diseases of salivary glands: Secondary | ICD-10-CM | POA: Insufficient documentation

## 2023-08-27 DIAGNOSIS — Z7982 Long term (current) use of aspirin: Secondary | ICD-10-CM | POA: Insufficient documentation

## 2023-08-27 DIAGNOSIS — Z7902 Long term (current) use of antithrombotics/antiplatelets: Secondary | ICD-10-CM | POA: Insufficient documentation

## 2023-08-27 DIAGNOSIS — R221 Localized swelling, mass and lump, neck: Secondary | ICD-10-CM | POA: Insufficient documentation

## 2023-08-27 DIAGNOSIS — F17210 Nicotine dependence, cigarettes, uncomplicated: Secondary | ICD-10-CM | POA: Insufficient documentation

## 2023-08-27 MED ORDER — MIDAZOLAM HCL 2 MG/2ML IJ SOLN
INTRAMUSCULAR | Status: AC
  Filled 2023-08-27: qty 2

## 2023-08-27 MED ORDER — LIDOCAINE HCL (PF) 1 % IJ SOLN
10.0000 mL | Freq: Once | INTRAMUSCULAR | Status: AC
  Administered 2023-08-27: 10 mL via INTRADERMAL

## 2023-08-27 MED ORDER — FENTANYL CITRATE (PF) 100 MCG/2ML IJ SOLN
INTRAMUSCULAR | Status: AC
  Filled 2023-08-27: qty 2

## 2023-08-27 MED ORDER — FENTANYL CITRATE (PF) 100 MCG/2ML IJ SOLN
INTRAMUSCULAR | Status: AC | PRN
  Administered 2023-08-27: 50 ug via INTRAVENOUS
  Administered 2023-08-27: 25 ug via INTRAVENOUS

## 2023-08-27 MED ORDER — MIDAZOLAM HCL 2 MG/2ML IJ SOLN
INTRAMUSCULAR | Status: AC | PRN
Start: 2023-08-27 — End: 2023-08-27
  Administered 2023-08-27: .5 mg via INTRAVENOUS
  Administered 2023-08-27: 1 mg via INTRAVENOUS

## 2023-08-27 NOTE — H&P (Signed)
Chief Complaint: Patient was seen in consultation today for left parotid gland biopsy at the request of Soldatova,[place]  Referring Physician(s): Soldatova,Liuba  Supervising Physician: Irish Lack  Patient Status: [facility] - Out-pt  History of Present Illness: [name] is a 67 y.o. male   Hx smoker--- ongoing Pt is followed with Cardiology and scheduled for carotid surgery soon Pre op CT revealing L parotid mass CT 10/2:  IMPRESSION: 1. 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 A radiographic string sign is present. Minimal attenuated intermittent flow distally with occlusion by the skull base. Distal reconstitution at the left carotid siphon with additional severe stenosis involving the paraclinoid left ICA. 2. 80% atheromatous stenosis about the right carotid bulb. 3. Severe 70% stenosis involving the proximal right V4 segment just distal to the dural reflection. The vertebral arteries are otherwise widely patent within the neck. 4. 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 ENT referral for further workup and evaluation suggested. 5. Few scattered pulmonary nodules measuring up to 6 mm within the visualized upper lungs. The patient has recently undergone a lung cancer screening chest CT on 07/28/2023. Please refer to this exam for follow-up recommendations regarding these findings  Sees Dr Melba Coon follows pt in Sleep apnea clinic---- was able to palpate node  Scheduled now for left parotid mass biopsy Takes ASA/Plavix--- not stopped  Past Medical History:  Diagnosis Date   Cervical spine ankylosis    CKD (chronic kidney disease) stage 3, GFR 30-59 ml/min (HCC)    COLONIC POLYPS, HX OF    CORONARY ARTERY DISEASE    a. s/p stent to RCA 2005. b. Stent to Cx 2007. c.  CABG X4 2014; d. LHC 04/19/18 occluded SVG-Circ, DES to in-stent restenosis of circ.     COVID-19 2020   Diverticulosis    DIVERTICULOSIS, COLON    DYSPHAGIA UNSPECIFIED    intermittent   GERD    HEMORRHOIDS    HYPERLIPIDEMIA    HYPERTENSION    HYPERTRIGLYCERIDEMIA    Left carotid artery occlusion    OBESITY    Postoperative atrial fibrillation (HCC) 2014   Pseudoaneurysm of left femoral artery (HCC) 02/16/2022   SLEEP APNEA    on CPAP   Statin intolerance    Tobacco abuse    Vertebral artery occlusion, left     Past Surgical History:  Procedure Laterality Date   ABDOMINAL AORTOGRAM W/LOWER EXTREMITY N/A 02/13/2022   Procedure: ABDOMINAL AORTOGRAM W/LOWER EXTREMITY;  Surgeon: Runell Gess, MD;  Location: [facility];  Service: Cardiovascular;  Laterality: N/A;   ABDOMINAL AORTOGRAM W/LOWER EXTREMITY N/A 08/28/2022   Procedure: ABDOMINAL AORTOGRAM W/LOWER EXTREMITY;  Surgeon: Cephus Shelling, MD;  Location: [facility];  Service: Cardiovascular;  Laterality: N/A;   ACHILLES TENDON REPAIR     BACK SURGERY     lower back   CARDIAC CATHETERIZATION     CATARACT EXTRACTION Bilateral    CORONARY ARTERY BYPASS GRAFT  08/18/2012   Procedure: CORONARY ARTERY BYPASS GRAFTING (CABG);  Surgeon: Alleen Borne, MD;  Location: [facility] OR;  Service: Open Heart Surgery;  Laterality: N/A;  CABG x four;  using left internal mammary artery and right leg greater saphenous vein harvested endoscopically   CORONARY STENT INTERVENTION N/A 04/19/2018   Procedure: CORONARY STENT INTERVENTION;  Surgeon: Runell Gess, MD;  Location: [facility];  Service: Cardiovascular;  Laterality: N/A;  ENDARTERECTOMY FEMORAL Left 02/17/2022   Procedure: ENDARTERECTOMY FEMORAL;  Surgeon: Cephus Shelling, MD;  Location: [facility] OR;  Service: Vascular;  Laterality: Left;   INTRAOPERATIVE ARTERIOGRAM Left 02/17/2022   Procedure: INTRA OPERATIVE ARTERIOGRAM;  Surgeon: Cephus Shelling, MD;  Location: [facility] OR;  Service: Vascular;  Laterality: Left;   KNEE ARTHROSCOPY      RIGHT   LEFT HEART CATH AND CORS/GRAFTS ANGIOGRAPHY N/A 04/19/2018   Procedure: LEFT HEART CATH AND CORS/GRAFTS ANGIOGRAPHY;  Surgeon: Runell Gess, MD;  Location: [facility];  Service: Cardiovascular;  Laterality: N/A;   LUMBAR LAMINECTOMY/DECOMPRESSION MICRODISCECTOMY N/A 12/05/2015   Procedure: LUMBAR 2-3, LUMBAR 3-4 DECOMPRESSION ;  Surgeon: Estill Bamberg, MD;  Location: [facility] OR;  Service: Orthopedics;  Laterality: N/A;   PATCH ANGIOPLASTY Left 02/17/2022   Procedure: PATCH ANGIOPLASTY Left Common Femoral Artery.;  Surgeon: Cephus Shelling, MD;  Location: [facility] OR;  Service: Vascular;  Laterality: Left;   PERIPHERAL VASCULAR ATHERECTOMY  02/13/2022   Procedure: PERIPHERAL VASCULAR ATHERECTOMY;  Surgeon: Runell Gess, MD;  Location: [facility];  Service: Cardiovascular;;  Rt SFA   PERIPHERAL VASCULAR INTERVENTION Left 08/28/2022   Procedure: PERIPHERAL VASCULAR INTERVENTION;  Surgeon: Cephus Shelling, MD;  Location: [facility];  Service: Cardiovascular;  Laterality: Left;  SFA/POP   Right long finger extensor tendon repair     THROMBECTOMY FEMORAL ARTERY Left 02/17/2022   Procedure: Repair of Left COMMON FEMORAL PSEUDOANEURYSM.;  Surgeon: Cephus Shelling, MD;  Location: [facility] OR;  Service: Vascular;  Laterality: Left;    Allergies: Statins, Crestor [rosuvastatin], Lipitor [atorvastatin], Oxycodone, and Hydrocodone  Medications: Prior to Admission medications   Medication Sig Start Date End Date Taking? Authorizing Provider  acetaminophen (TYLENOL) 500 MG tablet Take 1,000 mg by mouth every 6 (six) hours as needed for mild pain.   Yes [provider]  amLODipine (NORVASC) 10 MG tablet Take 1 tablet (10 mg total) by mouth daily. 07/17/23  Yes Wendall Stade, MD  aspirin EC 81 MG tablet Take 1 tablet (81 mg total) by mouth daily. 03/26/17  Yes Leone Brand, NP  clopidogrel (PLAVIX) 75 MG tablet Take 1 tablet (75 mg total) by mouth daily.  01/01/23  Yes Conte, Tessa N, PA-C  losartan-hydrochlorothiazide (HYZAAR) 100-12.5 MG tablet Take 1 tablet by mouth daily. 01/01/23  Yes Conte, Tessa N, PA-C  methocarbamol (ROBAXIN) 500 MG tablet Take 500 mg by mouth every 8 (eight) hours as needed for muscle spasms.   Yes [provider]  pantoprazole (PROTONIX) 40 MG tablet Take 1 tablet (40 mg total) by mouth daily. 02/24/23  Yes Wendall Stade, MD  Probiotic Product (PROBIOTIC-10 ULTIMATE) CAPS Take 1 capsule by mouth daily.   Yes [provider]  REPATHA SURECLICK 140 MG/ML SOAJ INJECT ONE ML INTO THE SKIN EVERY 14 DAYS. 04/20/23  Yes Wendall Stade, MD  cetirizine (ZYRTEC) 10 MG tablet Take 1 tablet (10 mg total) by mouth daily. Patient not taking: Reported on 08/19/2023 08/12/23   Ashok Croon, MD  fluticasone (FLONASE) 50 MCG/ACT nasal spray Place 2 sprays into both nostrils daily. 08/12/23   Ashok Croon, MD  HYDROmorphone (DILAUDID) 2 MG tablet Take 1 tablet (2 mg total) by mouth every 4 (four) hours as needed for severe pain. Patient not taking: Reported on 08/19/2023 08/25/22   Lars Mage, PA-C  nitroGLYCERIN (NITROSTAT) 0.4 MG SL tablet Place 1 tablet (0.4 mg total) under the tongue every 5 (five) minutes  as needed for chest pain. 01/01/23 08/19/23  Sharlene Dory, PA-C     Family History  Problem Relation Age of Onset   Cancer Mother    Heart disease Brother     Social History   Socioeconomic History   Marital status: Married    Spouse name: Not on file   Number of children: 3   Years of education: Not on file   Highest education level: Not on file  Occupational History   Not on file  Tobacco Use   Smoking status: Every Day    Current packs/day: 1.00    Average packs/day: 1 pack/day for 43.0 years (43.0 ttl pk-yrs)    Types: Cigarettes    Passive exposure: Current (Wife)   Smokeless tobacco: Never   Tobacco comments:    Pt still smoking 1 PPD 09/02/2022 PAP  Vaping Use   Vaping status:  Never Used  Substance and Sexual Activity   Alcohol use: Not Currently    Comment: pt quit drinking around June 2024   Drug use: No   Sexual activity: Not on file  Other Topics Concern   Not on file  Social History Narrative   Not on file   Social Determinants of Health   Financial Resource Strain: Not on file  Food Insecurity: Low Risk  (08/25/2023)   Received from [facility]   Hunger Vital Sign    Worried About Running Out of Food in the Last Year: Never true    Ran Out of Food in the Last Year: Never true  Transportation Needs: No Transportation Needs (08/25/2023)   Received from [facility]    In the past 12 months, has lack of reliable transportation kept you from medical appointments, meetings, work or from getting things needed for daily living? : No  Physical Activity: Not on file  Stress: Not on file  Social Connections: Unknown (03/11/2022)   Received from [facility]   Social Network    Social Network: Not on file    Review of Systems: A 12 point ROS discussed and pertinent positives are indicated in the HPI above.  All other systems are negative.  Review of Systems  Constitutional:  Negative for activity change, fatigue and fever.  HENT:  Negative for sore throat and tinnitus.   Respiratory:  Negative for cough and shortness of breath.   Cardiovascular:  Negative for chest pain.  Gastrointestinal:  Negative for abdominal pain.  Psychiatric/Behavioral:  Negative for behavioral problems and confusion.     Vital Signs: BP (!) 166/79   Pulse 71   Temp 98 F (36.7 C) (Temporal)   Resp 17   Ht 5\' 9"  (1.753 m)   Wt 210 lb (95.3 kg)   SpO2 100%   BMI 31.01 kg/m    Physical Exam Vitals reviewed.  HENT:     Mouth/Throat:     Mouth: Mucous membranes are moist.  Cardiovascular:     Rate and Rhythm: Normal rate and regular rhythm.     Heart sounds: Normal heart sounds.  Pulmonary:     Effort: Pulmonary effort is  normal.     Breath sounds: Normal breath sounds. No wheezing.  Abdominal:     Palpations: Abdomen is soft.     Tenderness: There is no abdominal tenderness.  Musculoskeletal:        General: Normal range of motion.  Skin:    General: Skin is warm and dry.  Neurological:  Mental Status: He is alert and oriented to person, place, and time.  Psychiatric:        Behavior: Behavior normal.     Imaging: CT CHEST LUNG CA SCREEN LOW DOSE W/O CM  Result Date: 08/13/2023 CLINICAL DATA:  41 pack-year smoking history/current smoker EXAM: CT CHEST WITHOUT CONTRAST LOW-DOSE FOR LUNG CANCER SCREENING TECHNIQUE: Multidetector CT imaging of the chest was performed following the standard protocol without IV contrast. RADIATION DOSE REDUCTION: This exam was performed according to the departmental dose-optimization program which includes automated exposure control, adjustment of the mA and/or kV according to patient size and/or use of iterative reconstruction technique. COMPARISON:  [id number] FINDINGS: Cardiovascular: Aortic atherosclerosis. Tortuous thoracic aorta. Normal heart size, without pericardial effusion. Median sternotomy for CABG. Mediastinum/Nodes: No mediastinal or hilar adenopathy, given limitations of unenhanced CT. Lungs/Pleura: No pleural fluid. Mild centrilobular emphysema. Minimal motion degradation in the lower chest. Pulmonary nodules of maximally volume derived equivalent diameter 3.8 mm, similar. Upper Abdomen: Normal imaged portions of the liver, spleen, stomach, pancreas, gallbladder, adrenal glands, left kidney. Upper pole right renal exophytic 2.7 cm cyst . In the absence of clinically indicated signs/symptoms require(s) no independent follow-up. Musculoskeletal: Intact sternotomy wires. Advanced lower cervical and lower thoracic spondylosis. IMPRESSION: 1. Lung-RADS 2, benign appearance or behavior. Continue annual screening with low-dose chest CT without contrast in 12 months. 2. Aortic  Atherosclerosis (ICD10-I70.0) and Emphysema (ICD10-J43.9). Electronically Signed   By: Jeronimo Greaves M.D.   On: 08/13/2023 16:47   DG Lumbar Spine Complete  Result Date: 08/12/2023 CLINICAL DATA:  Low back pain. EXAM: LUMBAR SPINE - COMPLETE 4+ VIEW COMPARISON:  None Available. FINDINGS: No evidence for an acute fracture or subluxation. Convex leftward thoracolumbar scoliosis evident. Diffuse loss of intervertebral disc height is associated with extensive endplate spurring. SI joints unremarkable. IMPRESSION: Convex leftward thoracolumbar scoliosis with advanced diffuse degenerative disease in lumbar spine. No acute bony findings. Electronically Signed   By: Kennith Center M.D.   On: 08/12/2023 10:28   CT ANGIO NECK W OR WO CONTRAST  Result Date: 08/03/2023 CLINICAL DATA:  Initial evaluation for carotid stenosis. EXAM: CT ANGIOGRAPHY NECK TECHNIQUE: Multidetector CT imaging of the neck was performed using the standard protocol during bolus administration of intravenous contrast. Multiplanar CT image reconstructions and MIPs were obtained to evaluate the vascular anatomy. Carotid stenosis measurements (when applicable) are obtained utilizing NASCET criteria, using the distal internal carotid diameter as the denominator. RADIATION DOSE REDUCTION: This exam was performed according to the departmental dose-optimization program which includes automated exposure control, adjustment of the mA and/or kV according to patient size and/or use of iterative reconstruction technique. CONTRAST:  75mL OMNIPAQUE IOHEXOL 350 MG/ML SOLN COMPARISON:  Prior ultrasound from 07/21/2019. FINDINGS: Aortic arch: Visualized aortic arch within normal limits for caliber with standard 3 vessel morphology. Sequelae of prior CABG noted. Moderate atheromatous change about the arch itself. No hemodynamically significant stenosis about the origin of the great vessels. Right carotid system: Right common and internal carotid arteries are patent  without dissection. Concentric mixed but predominantly soft noncalcified plaque about the right carotid bulb is seen. Associated stenosis measures up to 80% by NASCET criteria (series 7, image 157). Right ICA otherwise widely patent distally to the skull base. Moderate atheromatous change noted about the right carotid siphon without hemodynamically significant stenosis. Left carotid system: Mild eccentric noncalcified plaque present within the mid left CCA without hemodynamically significant stenosis. Predominantly soft plaque present about the left carotid bulb. There is high-grade severe near  occlusion of the cervical left ICA at its origin at the left carotid bifurcation (series 7, image 154). A radiographic string sign is present (series 9, image 144). Minimal attenuated intermittent flow seen distally within the cervical left ICA to its mid-distal aspect (series 7, image 111 for example). However, the cervical left ICA centrally occludes by the skull base. Distal reconstitution at the left carotid siphon the of the ophthalmic artery. Additional severe stenosis involving the paraclinoid left ICA noted as well (series 7, image 45). Left MCA and its visualized branches are patent and well perfused as are both ACAs. The left external carotid artery and its major branches are patent and well perfused. Vertebral arteries: Both vertebral arteries arise from the subclavian arteries. No hemodynamically significant proximal subclavian artery stenosis. Vertebral arteries are largely code dominant and patent without significant stenosis or dissection. Severe 70% stenosis seen involving the proximal right V4 segment just distal to the dural reflection (series 7, image 108). Right PICA is perfused and patent. Basilar widely patent and well perfused. Skeleton: No discrete or worrisome osseous lesions. Prominent bridging osteophytic spurring seen throughout the cervical spine with associated ankylosis, compatible with DISH.  Superimposed OPLL noted as well. 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 No other acute or significant finding within the neck. Upper chest: Emphysema. 6 mm left upper lobe pulmonary nodule (series 5, image 141), indeterminate. Additional 3 mm subpleural nodule present at the right upper lobe (series 5, image 43). IMPRESSION: 1. 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 A radiographic string sign is present. Minimal attenuated intermittent flow distally with occlusion by the skull base. Distal reconstitution at the left carotid siphon with additional severe stenosis involving the paraclinoid left ICA. 2. 80% atheromatous stenosis about the right carotid bulb. 3. Severe 70% stenosis involving the proximal right V4 segment just distal to the dural reflection. The vertebral arteries are otherwise widely patent within the neck. 4. 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 ENT referral for further workup and evaluation suggested. 5. Few scattered pulmonary nodules measuring up to 6 mm within the visualized upper lungs. The patient has recently undergone a lung cancer screening chest CT on 07/28/2023. Please refer to this exam for follow-up recommendations regarding these findings. Aortic Atherosclerosis (ICD10-I70.0) and Emphysema (ICD10-J43.9). Electronically Signed   By: Rise Mu M.D.   On: 08/03/2023 04:32    Labs:  CBC: Recent Labs    08/28/22 1031 07/10/23 0806 08/26/23 1419  WBC  --  6.2 8.0  HGB 15.6 15.3 14.0  HCT 46.0 45.8 41.3  PLT  --  194 205    COAGS: Recent Labs    08/26/23 1419  INR 1.0  APTT 30    BMP: Recent Labs    08/28/22 1031 07/10/23 0806 08/26/23 1419  NA 138 136 134*  K 4.2 4.5 3.7  CL 106 102 100  CO2  --  22 23  GLUCOSE 95  97 132*  BUN 26* 21 22  CALCIUM  --  9.1 9.5  CREATININE 1.50* 1.59* 1.69*  GFRNONAA  --   --  43*    LIVER FUNCTION TESTS: Recent Labs    07/10/23 0806 08/26/23 1419  BILITOT 0.8 0.5  AST 16 22  ALT 16 22  ALKPHOS  --  44  PROT  6.6 6.8  ALBUMIN  --  3.8    TUMOR MARKERS: No results for input(s): "AFPTM", "CEA", "CA199", "CHROMGRNA" in the last 8760 hours.  Assessment and Plan:  Scheduled today for left parotid mass biopsy Risks and benefits of left parotid mass biopsy was discussed with the patient and/or patient's family including, but not limited to bleeding, infection, damage to adjacent structures or low yield requiring additional tests.  All of the questions were answered and there is agreement to proceed.  Consent signed and in chart.  Thank you for this interesting consult.  I greatly enjoyed meeting SAHAN KIDDER and look forward to participating in their care.  A copy of this report was sent to the requesting provider on this date.  Electronically Signed: Robet Leu, PA-C 08/27/2023, 7:09 AM   I spent a total of  30 Minutes   in face to face in clinical consultation, greater than 50% of which was counseling/coordinating care for left parotid mass bx

## 2023-08-27 NOTE — Procedures (Signed)
Interventional Radiology Procedure Note  Procedure: US Guided Biopsy of left parotid mass  Complications: None  Estimated Blood Loss: < 10 mL  Findings: 18 G core biopsy of left parotid mass performed under US guidance.  Three core samples obtained and sent to Pathology.  Jodi Marble. Fredia Sorrow, M.D Pager:  [phone]

## 2023-08-28 LAB — SURGICAL PATHOLOGY

## 2023-08-28 NOTE — Anesthesia Preprocedure Evaluation (Addendum)
Anesthesia Evaluation  Patient identified by MRN, date of birth, ID band Patient awake    Reviewed: Allergy & Precautions, NPO status , Patient's Chart, lab work & pertinent test results, reviewed documented beta blocker date and time   History of Anesthesia Complications Negative for: history of anesthetic complications  Airway Mallampati: III  TM Distance: >3 FB     Dental no notable dental hx.    Pulmonary sleep apnea , COPD, Current Smoker and Patient abstained from smoking.   breath sounds clear to auscultation       Cardiovascular hypertension, (-) angina + CAD, + Cardiac Stents, + CABG and + Peripheral Vascular Disease  + dysrhythmias Atrial Fibrillation  Rhythm:Regular Rate:Normal     Neuro/Psych    GI/Hepatic ,GERD  ,,(+) neg Cirrhosis        Endo/Other    Renal/GU CRFRenal disease     Musculoskeletal   Abdominal   Peds  Hematology   Anesthesia Other Findings   Reproductive/Obstetrics                              Anesthesia Physical Anesthesia Plan  ASA: 3  Anesthesia Plan: General   Post-op Pain Management:    Induction: Intravenous  PONV Risk Score and Plan: 1  Airway Management Planned: Oral ETT and Video Laryngoscope Planned  Additional Equipment: Arterial line  Intra-op Plan:   Post-operative Plan: Extubation in OR  Informed Consent: I have reviewed the patients History and Physical, chart, labs and discussed the procedure including the risks, benefits and alternatives for the proposed anesthesia with the patient or authorized representative who has indicated his/her understanding and acceptance.     Dental advisory given  Plan Discussed with:   Anesthesia Plan Comments: (PAT note by Antionette Poles, PA-C: 67 year old male follows with cardiology for history of CAD s/p PCI of RCA 2005, PCI of Cx 2007, CABG x 4 in 2014, PCI of OM 03/2018, post-op atrial fib  (on amiodarone briefly), OSA on CPAP, hypertension, hyperlipidemia.  Last seen by Dr. Eden Emms on 07/17/2023.  Stable from cardiac standpoint, upcoming vascular surgery discussed.  Per note, "I suspect he will need left fem pop in near future. He had a non ischemic myovue done 09/10/22 and has no angina."  Follows with pulmonology for history of current smoker with associated chronic bronchitis and OSA on CPAP.  Last seen by Dr. Maple Hudson, noted to have 100% compliance with CPAP.  Otherwise stable from pulmonary standpoint.  Discussed pending carotid endarterectomy as well as left parotid mass biopsy.  Follows with vascular surgery for history of PAD s/p repair of left common femoral pseudoaneurysm 01/2022, left SFA and popliteal stent 08/2022, atherectomy and DCB 01/2022.  Recent ultrasound showed left leg stents to be occluded.  He is also followed for a known history of chronic left carotid occlusion.  Recent duplex showed stenosis greater than 80% on the right.  Endarterectomy recommended.  Other pertinent history includes CKD 3, GERD, cervical spine ankylosis.  Video laryngoscope used for intubation 12/15/2015.  Patient noted to have anterior larynx on intubation 02/17/2022.  Patient reports last dose Plavix 08/29/23.  Preop labs reviewed, creatinine elevated 1.69 consistent with history of CKD, mild hyponatremia sodium 134, otherwise unremarkable.  EKG 01/01/2023: NSR.  Rate 72.  Right bundle branch block.  Possible inferior infarct, age undetermined.  Nuclear stress 09/10/2022:   LV perfusion is abnormal. There is no evidence of ischemia. There is evidence of  infarction. Defect 1: There is a small defect with moderate reduction in uptake present in the apical inferior location(s) that is fixed. There is normal wall motion in the defect area. Consistent with infarction.   Left ventricular function is normal. Nuclear stress EF: 64 %. The left ventricular ejection fraction is normal (55-65%). End diastolic  cavity size is normal.   Findings are consistent with prior myocardial infarction. The study is low risk.   )         Anesthesia Quick Evaluation

## 2023-08-28 NOTE — Progress Notes (Signed)
Anesthesia Chart Review:  67 year old male follows with cardiology for history of CAD s/p PCI of RCA 2005, PCI of Cx 2007, CABG x 4 in 2014, PCI of OM 03/2018, post-op atrial fib (on amiodarone briefly), OSA on CPAP, hypertension, hyperlipidemia.  Last seen by Dr. Eden Emms on 07/17/2023.  Stable from cardiac standpoint, upcoming vascular surgery discussed.  Per note, "I suspect he will need left fem pop in near future. He had a non ischemic myovue done 09/10/22 and has no angina."  Follows with pulmonology for history of current smoker with associated chronic bronchitis and OSA on CPAP.  Last seen by Dr. Maple Hudson, noted to have 100% compliance with CPAP.  Otherwise stable from pulmonary standpoint.  Discussed pending carotid endarterectomy as well as left parotid mass biopsy.  Follows with vascular surgery for history of PAD s/p repair of left common femoral pseudoaneurysm 01/2022, left SFA and popliteal stent 08/2022, atherectomy and DCB 01/2022.  Recent ultrasound showed left leg stents to be occluded.  He is also followed for a known history of chronic left carotid occlusion.  Recent duplex showed stenosis greater than 80% on the right.  Endarterectomy recommended.  Other pertinent history includes CKD 3, GERD, cervical spine ankylosis.  Video laryngoscope used for intubation 12/15/2015.  Patient noted to have anterior larynx on intubation 02/17/2022.  Patient reports last dose Plavix 08/29/23.  Preop labs reviewed, creatinine elevated 1.69 consistent with history of CKD, mild hyponatremia sodium 134, otherwise unremarkable.  EKG 01/01/2023: NSR.  Rate 72.  Right bundle branch block.  Possible inferior infarct, age undetermined.  Nuclear stress 09/10/2022:   LV perfusion is abnormal. There is no evidence of ischemia. There is evidence of infarction. Defect 1: There is a small defect with moderate reduction in uptake present in the apical inferior location(s) that is fixed. There is normal wall motion in the  defect area. Consistent with infarction.   Left ventricular function is normal. Nuclear stress EF: 64 %. The left ventricular ejection fraction is normal (55-65%). End diastolic cavity size is normal.   Findings are consistent with prior myocardial infarction. The study is low risk.    Zannie Cove [facility] Stay [facility]/Anesthesiology Phone [phone] 08/28/2023 2:05 PM

## 2023-09-02 ENCOUNTER — Telehealth: Payer: Self-pay | Admitting: Cardiovascular Disease

## 2023-09-02 NOTE — Telephone Encounter (Signed)
Pt c/o medication issue:  1. Name of Medication: Repatha  2. How are you currently taking this medication (dosage and times per day)?   3. Are you having a reaction (difficulty breathing--STAT)?   4. What is your medication issue? One of his last injection was damaged and had to be sent back- He said that the company ([employer])wants this office to contact them at [phone]. Case number is [id number]

## 2023-09-02 NOTE — Telephone Encounter (Signed)
Provided verbal order over the phone to replace malfunctioning Repatha pen.  Spoke to Reuel Boom, PharmD at [employer].

## 2023-09-03 ENCOUNTER — Telehealth (INDEPENDENT_AMBULATORY_CARE_PROVIDER_SITE_OTHER): Payer: Self-pay

## 2023-09-03 NOTE — Telephone Encounter (Signed)
Spoke to patient and gave biopsy results.

## 2023-09-04 ENCOUNTER — Encounter (HOSPITAL_COMMUNITY)
Admission: RE | Disposition: A | Discharge status: Home / Self Care | Payer: Self-pay | Source: Home / Self Care | Attending: Surgery

## 2023-09-04 ENCOUNTER — Inpatient Hospital Stay (HOSPITAL_COMMUNITY): Payer: BC Managed Care – PPO | Admitting: Physician Assistant

## 2023-09-04 ENCOUNTER — Other Ambulatory Visit: Payer: Self-pay

## 2023-09-04 ENCOUNTER — Encounter (HOSPITAL_COMMUNITY): Payer: Self-pay | Admitting: Surgery

## 2023-09-04 ENCOUNTER — Inpatient Hospital Stay (HOSPITAL_COMMUNITY): Payer: BC Managed Care – PPO | Admitting: Anesthesiology

## 2023-09-04 ENCOUNTER — Inpatient Hospital Stay (HOSPITAL_COMMUNITY)
Admission: RE | Admit: 2023-09-04 | Discharge: 2023-09-05 | DRG: 038 | Disposition: A | Discharge status: Home / Self Care | Payer: BC Managed Care – PPO | Attending: Surgery | Admitting: Surgery

## 2023-09-04 ENCOUNTER — Ambulatory Visit: Payer: BC Managed Care – PPO | Admitting: Internal Medicine

## 2023-09-04 DIAGNOSIS — Z79899 Other long term (current) drug therapy: Secondary | ICD-10-CM

## 2023-09-04 DIAGNOSIS — Z885 Allergy status to narcotic agent status: Secondary | ICD-10-CM

## 2023-09-04 DIAGNOSIS — I6523 Occlusion and stenosis of bilateral carotid arteries: Principal | ICD-10-CM | POA: Diagnosis present

## 2023-09-04 DIAGNOSIS — I6521 Occlusion and stenosis of right carotid artery: Secondary | ICD-10-CM | POA: Diagnosis not present

## 2023-09-04 DIAGNOSIS — K219 Gastro-esophageal reflux disease without esophagitis: Secondary | ICD-10-CM | POA: Diagnosis present

## 2023-09-04 DIAGNOSIS — I25810 Atherosclerosis of coronary artery bypass graft(s) without angina pectoris: Secondary | ICD-10-CM | POA: Diagnosis present

## 2023-09-04 DIAGNOSIS — Z888 Allergy status to other drugs, medicaments and biological substances status: Secondary | ICD-10-CM

## 2023-09-04 DIAGNOSIS — N183 Chronic kidney disease, stage 3 unspecified: Secondary | ICD-10-CM | POA: Diagnosis present

## 2023-09-04 DIAGNOSIS — E7800 Pure hypercholesterolemia, unspecified: Secondary | ICD-10-CM | POA: Diagnosis present

## 2023-09-04 DIAGNOSIS — Z7982 Long term (current) use of aspirin: Secondary | ICD-10-CM | POA: Diagnosis not present

## 2023-09-04 DIAGNOSIS — Z8616 Personal history of COVID-19: Secondary | ICD-10-CM | POA: Diagnosis not present

## 2023-09-04 DIAGNOSIS — I129 Hypertensive chronic kidney disease with stage 1 through stage 4 chronic kidney disease, or unspecified chronic kidney disease: Secondary | ICD-10-CM | POA: Diagnosis present

## 2023-09-04 DIAGNOSIS — F17210 Nicotine dependence, cigarettes, uncomplicated: Secondary | ICD-10-CM | POA: Diagnosis present

## 2023-09-04 DIAGNOSIS — Z860100 Personal history of colon polyps, unspecified: Secondary | ICD-10-CM

## 2023-09-04 DIAGNOSIS — Z955 Presence of coronary angioplasty implant and graft: Secondary | ICD-10-CM | POA: Diagnosis not present

## 2023-09-04 DIAGNOSIS — E781 Pure hyperglyceridemia: Secondary | ICD-10-CM | POA: Diagnosis present

## 2023-09-04 DIAGNOSIS — Z7902 Long term (current) use of antithrombotics/antiplatelets: Secondary | ICD-10-CM

## 2023-09-04 DIAGNOSIS — I2510 Atherosclerotic heart disease of native coronary artery without angina pectoris: Secondary | ICD-10-CM | POA: Diagnosis present

## 2023-09-04 DIAGNOSIS — Z95820 Peripheral vascular angioplasty status with implants and grafts: Secondary | ICD-10-CM

## 2023-09-04 DIAGNOSIS — I6529 Occlusion and stenosis of unspecified carotid artery: Principal | ICD-10-CM | POA: Diagnosis present

## 2023-09-04 DIAGNOSIS — Z8249 Family history of ischemic heart disease and other diseases of the circulatory system: Secondary | ICD-10-CM | POA: Diagnosis not present

## 2023-09-04 HISTORY — PX: ENDARTERECTOMY: SHX5162

## 2023-09-04 SURGERY — ENDARTERECTOMY, CAROTID
Anesthesia: General | Site: Neck | Laterality: Right

## 2023-09-04 MED ORDER — OXYCODONE HCL 5 MG PO TABS: 5.0000 mg | ORAL_TABLET | Freq: Once | ORAL | Status: DC | PRN

## 2023-09-04 MED ORDER — 0.9 % SODIUM CHLORIDE (POUR BTL) OPTIME
TOPICAL | Status: DC | PRN
  Administered 2023-09-04 (×2): 1000 mL

## 2023-09-04 MED ORDER — ACETAMINOPHEN 10 MG/ML IV SOLN
INTRAVENOUS | Status: AC
  Administered 2023-09-04: 1000 mg via INTRAVENOUS
  Filled 2023-09-04: qty 100

## 2023-09-04 MED ORDER — PHENYLEPHRINE 80 MCG/ML (10ML) SYRINGE FOR IV PUSH (FOR BLOOD PRESSURE SUPPORT)
PREFILLED_SYRINGE | INTRAVENOUS | Status: DC | PRN
  Administered 2023-09-04: 160 ug via INTRAVENOUS
  Administered 2023-09-04: 80 ug via INTRAVENOUS

## 2023-09-04 MED ORDER — LABETALOL HCL 5 MG/ML IV SOLN: 10.0000 mg | INTRAVENOUS | Status: DC | PRN

## 2023-09-04 MED ORDER — DOPAMINE-DEXTROSE 3.2-5 MG/ML-% IV SOLN
3.0000 ug/kg/min | INTRAVENOUS | Status: DC
  Administered 2023-09-04: 3 ug/kg/min via INTRAVENOUS

## 2023-09-04 MED ORDER — OXYCODONE HCL 5 MG/5ML PO SOLN: 5.0000 mg | Freq: Once | ORAL | Status: DC | PRN

## 2023-09-04 MED ORDER — EPHEDRINE 5 MG/ML INJ
INTRAVENOUS | Status: AC
  Filled 2023-09-04: qty 5

## 2023-09-04 MED ORDER — FENTANYL CITRATE (PF) 100 MCG/2ML IJ SOLN
25.0000 ug | INTRAMUSCULAR | Status: DC | PRN
  Administered 2023-09-04: 25 ug via INTRAVENOUS

## 2023-09-04 MED ORDER — HYDRALAZINE HCL 20 MG/ML IJ SOLN: 5.0000 mg | INTRAMUSCULAR | Status: DC | PRN

## 2023-09-04 MED ORDER — GLYCOPYRROLATE PF 0.2 MG/ML IJ SOSY
PREFILLED_SYRINGE | INTRAMUSCULAR | Status: DC | PRN
  Administered 2023-09-04: .1 mg via INTRAVENOUS

## 2023-09-04 MED ORDER — DOPAMINE-DEXTROSE 3.2-5 MG/ML-% IV SOLN
INTRAVENOUS | Status: AC
  Filled 2023-09-04: qty 250

## 2023-09-04 MED ORDER — LIDOCAINE 2% (20 MG/ML) 5 ML SYRINGE
INTRAMUSCULAR | Status: DC | PRN
  Administered 2023-09-04: 60 mg via INTRAVENOUS

## 2023-09-04 MED ORDER — CEFAZOLIN SODIUM-DEXTROSE 2-4 GM/100ML-% IV SOLN
2.0000 g | INTRAVENOUS | Status: AC
  Administered 2023-09-04: 2 g via INTRAVENOUS
  Filled 2023-09-04: qty 100

## 2023-09-04 MED ORDER — PHENOL 1.4 % MT LIQD: 1.0000 | OROMUCOSAL | Status: DC | PRN

## 2023-09-04 MED ORDER — ACETAMINOPHEN 10 MG/ML IV SOLN
1000.0000 mg | Freq: Once | INTRAVENOUS | Status: DC | PRN
Start: 2023-09-04 — End: 2023-09-04

## 2023-09-04 MED ORDER — PANTOPRAZOLE SODIUM 40 MG PO TBEC: 40.0000 mg | DELAYED_RELEASE_TABLET | Freq: Every day | ORAL | Status: DC

## 2023-09-04 MED ORDER — MIDAZOLAM HCL 2 MG/2ML IJ SOLN
INTRAMUSCULAR | Status: AC
  Filled 2023-09-04: qty 2

## 2023-09-04 MED ORDER — LIDOCAINE 2% (20 MG/ML) 5 ML SYRINGE
INTRAMUSCULAR | Status: AC
  Filled 2023-09-04: qty 5

## 2023-09-04 MED ORDER — CHLORHEXIDINE GLUCONATE 0.12 % MT SOLN
15.0000 mL | Freq: Once | OROMUCOSAL | Status: AC
  Administered 2023-09-04: 15 mL via OROMUCOSAL
  Filled 2023-09-04: qty 15

## 2023-09-04 MED ORDER — CEFAZOLIN SODIUM-DEXTROSE 2-4 GM/100ML-% IV SOLN
2.0000 g | Freq: Three times a day (TID) | INTRAVENOUS | Status: AC
  Administered 2023-09-04 – 2023-09-05 (×2): 2 g via INTRAVENOUS
  Filled 2023-09-04 (×2): qty 100

## 2023-09-04 MED ORDER — HEPARIN SODIUM (PORCINE) 5000 UNIT/ML IJ SOLN
5000.0000 [IU] | Freq: Three times a day (TID) | INTRAMUSCULAR | Status: DC
  Administered 2023-09-04 – 2023-09-05 (×3): 5000 [IU] via SUBCUTANEOUS
  Filled 2023-09-04 (×3): qty 1

## 2023-09-04 MED ORDER — HYDROMORPHONE HCL 1 MG/ML IJ SOLN
0.5000 mg | INTRAMUSCULAR | Status: DC | PRN
Start: 2023-09-04 — End: 2023-09-05

## 2023-09-04 MED ORDER — ONDANSETRON HCL 4 MG/2ML IJ SOLN: 4.0000 mg | Freq: Once | INTRAMUSCULAR | Status: DC | PRN

## 2023-09-04 MED ORDER — ACETAMINOPHEN 325 MG PO TABS: 325.0000 mg | ORAL_TABLET | ORAL | Status: DC | PRN

## 2023-09-04 MED ORDER — POTASSIUM CHLORIDE CRYS ER 20 MEQ PO TBCR: 20.0000 meq | EXTENDED_RELEASE_TABLET | Freq: Every day | ORAL | Status: DC | PRN

## 2023-09-04 MED ORDER — ONDANSETRON HCL 4 MG/2ML IJ SOLN: 4.0000 mg | Freq: Four times a day (QID) | INTRAMUSCULAR | Status: DC | PRN

## 2023-09-04 MED ORDER — ASPIRIN 81 MG PO TBEC: 81.0000 mg | DELAYED_RELEASE_TABLET | Freq: Every day | ORAL | Status: DC

## 2023-09-04 MED ORDER — MIDAZOLAM HCL 2 MG/2ML IJ SOLN
INTRAMUSCULAR | Status: DC | PRN
  Administered 2023-09-04: 2 mg via INTRAVENOUS

## 2023-09-04 MED ORDER — LACTATED RINGERS IV SOLN
INTRAVENOUS | Status: DC
Start: 2023-09-04 — End: 2023-09-04

## 2023-09-04 MED ORDER — ACETAMINOPHEN 500 MG PO TABS: 1000.0000 mg | ORAL_TABLET | Freq: Four times a day (QID) | ORAL | Status: DC | PRN

## 2023-09-04 MED ORDER — LOSARTAN POTASSIUM-HCTZ 100-12.5 MG PO TABS: 1.0000 | ORAL_TABLET | Freq: Every day | ORAL | Status: DC

## 2023-09-04 MED ORDER — SURGIFLO WITH THROMBIN (HEMOSTATIC MATRIX KIT) OPTIME
TOPICAL | Status: DC | PRN
  Administered 2023-09-04: 1 via TOPICAL

## 2023-09-04 MED ORDER — CHLORHEXIDINE GLUCONATE CLOTH 2 % EX PADS: 6.0000 | MEDICATED_PAD | Freq: Once | CUTANEOUS | Status: DC

## 2023-09-04 MED ORDER — EPHEDRINE 5 MG/ML INJ
INTRAVENOUS | Status: AC
Start: 2023-09-04 — End: ?
  Filled 2023-09-04: qty 5

## 2023-09-04 MED ORDER — FENTANYL CITRATE (PF) 250 MCG/5ML IJ SOLN
INTRAMUSCULAR | Status: DC | PRN
  Administered 2023-09-04: 50 ug via INTRAVENOUS

## 2023-09-04 MED ORDER — AMLODIPINE BESYLATE 10 MG PO TABS
10.0000 mg | ORAL_TABLET | Freq: Every day | ORAL | Status: DC
  Filled 2023-09-04: qty 1

## 2023-09-04 MED ORDER — MAGNESIUM SULFATE 2 GM/50ML IV SOLN: 2.0000 g | Freq: Every day | INTRAVENOUS | Status: DC | PRN

## 2023-09-04 MED ORDER — ACETAMINOPHEN 650 MG RE SUPP: 325.0000 mg | RECTAL | Status: DC | PRN

## 2023-09-04 MED ORDER — PANTOPRAZOLE SODIUM 40 MG PO TBEC
40.0000 mg | DELAYED_RELEASE_TABLET | Freq: Every day | ORAL | Status: DC
  Administered 2023-09-05: 40 mg via ORAL
  Filled 2023-09-04: qty 1

## 2023-09-04 MED ORDER — PROPOFOL 10 MG/ML IV BOLUS
INTRAVENOUS | Status: AC
  Filled 2023-09-04: qty 20

## 2023-09-04 MED ORDER — CLOPIDOGREL BISULFATE 75 MG PO TABS
75.0000 mg | ORAL_TABLET | Freq: Every day | ORAL | Status: DC
  Administered 2023-09-04 – 2023-09-05 (×2): 75 mg via ORAL
  Filled 2023-09-04 (×2): qty 1

## 2023-09-04 MED ORDER — DOCUSATE SODIUM 100 MG PO CAPS
100.0000 mg | ORAL_CAPSULE | Freq: Every day | ORAL | Status: DC
Start: 2023-09-05 — End: 2023-09-05

## 2023-09-04 MED ORDER — HEPARIN SODIUM (PORCINE) 1000 UNIT/ML IJ SOLN
INTRAMUSCULAR | Status: DC | PRN
  Administered 2023-09-04: 10000 [IU] via INTRAVENOUS

## 2023-09-04 MED ORDER — HEPARIN 6000 UNIT IRRIGATION SOLUTION
Status: DC | PRN
  Administered 2023-09-04: 1

## 2023-09-04 MED ORDER — ONDANSETRON HCL 4 MG/2ML IJ SOLN
INTRAMUSCULAR | Status: DC | PRN
  Administered 2023-09-04: 4 mg via INTRAVENOUS

## 2023-09-04 MED ORDER — FLUTICASONE PROPIONATE 50 MCG/ACT NA SUSP
2.0000 | Freq: Every day | NASAL | Status: DC
  Filled 2023-09-04: qty 16

## 2023-09-04 MED ORDER — ALUM & MAG HYDROXIDE-SIMETH 200-200-20 MG/5ML PO SUSP: 15.0000 mL | ORAL | Status: DC | PRN

## 2023-09-04 MED ORDER — PROPOFOL 10 MG/ML IV BOLUS
INTRAVENOUS | Status: DC | PRN
  Administered 2023-09-04: 120 mg via INTRAVENOUS
  Administered 2023-09-04: 125 ug/kg/min via INTRAVENOUS

## 2023-09-04 MED ORDER — DEXAMETHASONE SODIUM PHOSPHATE 10 MG/ML IJ SOLN
INTRAMUSCULAR | Status: DC | PRN
  Administered 2023-09-04: 10 mg via INTRAVENOUS

## 2023-09-04 MED ORDER — FENTANYL CITRATE (PF) 100 MCG/2ML IJ SOLN
INTRAMUSCULAR | Status: AC
  Filled 2023-09-04: qty 2

## 2023-09-04 MED ORDER — ORAL CARE MOUTH RINSE: 15.0000 mL | Freq: Once | OROMUCOSAL | Status: AC

## 2023-09-04 MED ORDER — SODIUM CHLORIDE 0.9 % IV SOLN: INTRAVENOUS | Status: DC

## 2023-09-04 MED ORDER — ASPIRIN 81 MG PO TBEC
81.0000 mg | DELAYED_RELEASE_TABLET | Freq: Every day | ORAL | Status: DC
Start: 2023-09-05 — End: 2023-09-05
  Administered 2023-09-05: 81 mg via ORAL
  Filled 2023-09-04: qty 1

## 2023-09-04 MED ORDER — EPHEDRINE SULFATE-NACL 50-0.9 MG/10ML-% IV SOSY
PREFILLED_SYRINGE | INTRAVENOUS | Status: DC | PRN
  Administered 2023-09-04: 2.5 mg via INTRAVENOUS
  Administered 2023-09-04: 5 mg via INTRAVENOUS
  Administered 2023-09-04 (×4): 2.5 mg via INTRAVENOUS

## 2023-09-04 MED ORDER — VASOPRESSIN 20 UNIT/ML IV SOLN
INTRAVENOUS | Status: AC
  Filled 2023-09-04: qty 1

## 2023-09-04 MED ORDER — SENNOSIDES-DOCUSATE SODIUM 8.6-50 MG PO TABS: 1.0000 | ORAL_TABLET | Freq: Every evening | ORAL | Status: DC | PRN

## 2023-09-04 MED ORDER — PHENYLEPHRINE HCL-NACL 20-0.9 MG/250ML-% IV SOLN
INTRAVENOUS | Status: DC | PRN
  Administered 2023-09-04: 40 ug/min via INTRAVENOUS

## 2023-09-04 MED ORDER — FENTANYL CITRATE (PF) 250 MCG/5ML IJ SOLN
INTRAMUSCULAR | Status: AC
  Filled 2023-09-04: qty 5

## 2023-09-04 MED ORDER — LOSARTAN POTASSIUM 50 MG PO TABS
100.0000 mg | ORAL_TABLET | Freq: Every day | ORAL | Status: DC
  Filled 2023-09-04: qty 2

## 2023-09-04 MED ORDER — ROCURONIUM BROMIDE 10 MG/ML (PF) SYRINGE
PREFILLED_SYRINGE | INTRAVENOUS | Status: DC | PRN
  Administered 2023-09-04: 20 mg via INTRAVENOUS
  Administered 2023-09-04: 70 mg via INTRAVENOUS

## 2023-09-04 MED ORDER — LORATADINE 10 MG PO TABS: 10.0000 mg | ORAL_TABLET | Freq: Every day | ORAL | Status: DC | PRN

## 2023-09-04 MED ORDER — ROCURONIUM BROMIDE 10 MG/ML (PF) SYRINGE
PREFILLED_SYRINGE | INTRAVENOUS | Status: AC
  Filled 2023-09-04: qty 10

## 2023-09-04 MED ORDER — PHENYLEPHRINE 80 MCG/ML (10ML) SYRINGE FOR IV PUSH (FOR BLOOD PRESSURE SUPPORT)
PREFILLED_SYRINGE | INTRAVENOUS | Status: AC
Start: 2023-09-04 — End: ?
  Filled 2023-09-04: qty 10

## 2023-09-04 MED ORDER — METOPROLOL TARTRATE 5 MG/5ML IV SOLN: 2.0000 mg | INTRAVENOUS | Status: DC | PRN

## 2023-09-04 MED ORDER — VASOPRESSIN 20 UNIT/ML IV SOLN
INTRAVENOUS | Status: DC | PRN
  Administered 2023-09-04: 1 [IU] via INTRAVENOUS

## 2023-09-04 MED ORDER — HYDROMORPHONE HCL 2 MG PO TABS
2.0000 mg | ORAL_TABLET | ORAL | Status: DC | PRN
  Administered 2023-09-04: 2 mg via ORAL
  Filled 2023-09-04: qty 1

## 2023-09-04 MED ORDER — GUAIFENESIN-DM 100-10 MG/5ML PO SYRP: 15.0000 mL | ORAL_SOLUTION | ORAL | Status: DC | PRN

## 2023-09-04 MED ORDER — BISACODYL 5 MG PO TBEC: 5.0000 mg | DELAYED_RELEASE_TABLET | Freq: Every day | ORAL | Status: DC | PRN

## 2023-09-04 MED ORDER — PROTAMINE SULFATE 10 MG/ML IV SOLN
INTRAVENOUS | Status: DC | PRN
  Administered 2023-09-04: 50 mg via INTRAVENOUS

## 2023-09-04 MED ORDER — METHOCARBAMOL 500 MG PO TABS: 500.0000 mg | ORAL_TABLET | Freq: Three times a day (TID) | ORAL | Status: DC | PRN

## 2023-09-04 MED ORDER — HYDROCHLOROTHIAZIDE 12.5 MG PO TABS
12.5000 mg | ORAL_TABLET | Freq: Every day | ORAL | Status: DC
  Administered 2023-09-05: 12.5 mg via ORAL
  Filled 2023-09-04: qty 1

## 2023-09-04 SURGICAL SUPPLY — 33 items
ADH SKN CLS APL DERMABOND .7 (GAUZE/BANDAGES/DRESSINGS) ×1
AGENT HMST KT MTR STRL THRMB (HEMOSTASIS) ×1
BAG COUNTER SPONGE SURGICOUNT (BAG) ×1 IMPLANT
BAG SPNG CNTER NS LX DISP (BAG) ×1
CANISTER SUCT 3000ML PPV (MISCELLANEOUS) ×1 IMPLANT
CATH ROBINSON RED A/P 18FR (CATHETERS) ×1 IMPLANT
CATH SUCT 10FR WHISTLE TIP (CATHETERS) ×1 IMPLANT
CLIP TI MEDIUM 6 (CLIP) ×1 IMPLANT
CLIP TI WIDE RED SMALL 6 (CLIP) ×1 IMPLANT
COVER PROBE W GEL 5X96 (DRAPES) IMPLANT
DERMABOND ADVANCED .7 DNX12 (GAUZE/BANDAGES/DRESSINGS) ×1 IMPLANT
ELECT REM PT RETURN 9FT ADLT (ELECTROSURGICAL) ×1
ELECTRODE REM PT RTRN 9FT ADLT (ELECTROSURGICAL) ×1 IMPLANT
GLOVE SURG SS PI 7.5 STRL IVOR (GLOVE) ×3 IMPLANT
GOWN STRL REUS W/ TWL LRG LVL3 (GOWN DISPOSABLE) ×2 IMPLANT
GOWN STRL REUS W/ TWL XL LVL3 (GOWN DISPOSABLE) ×1 IMPLANT
GOWN STRL REUS W/TWL LRG LVL3 (GOWN DISPOSABLE) ×2
GOWN STRL REUS W/TWL XL LVL3 (GOWN DISPOSABLE) ×1
KIT BASIN OR (CUSTOM PROCEDURE TRAY) ×1 IMPLANT
KIT TURNOVER KIT B (KITS) ×1 IMPLANT
NS IRRIG 1000ML POUR BTL (IV SOLUTION) ×3 IMPLANT
PACK CAROTID (CUSTOM PROCEDURE TRAY) ×1 IMPLANT
PAD ARMBOARD 7.5X6 YLW CONV (MISCELLANEOUS) ×2 IMPLANT
PATCH VASC XENOSURE 1X6 (Vascular Products) IMPLANT
POSITIONER HEAD DONUT 9IN (MISCELLANEOUS) ×1 IMPLANT
SURGIFLO W/THROMBIN 8M KIT (HEMOSTASIS) IMPLANT
SUT PROLENE 6 0 BV (SUTURE) ×2 IMPLANT
SUT VIC AB 3-0 SH 27 (SUTURE) ×2
SUT VIC AB 3-0 SH 27X BRD (SUTURE) ×2 IMPLANT
SUT VIC AB 3-0 X1 27 (SUTURE) ×1 IMPLANT
TOWEL GREEN STERILE (TOWEL DISPOSABLE) ×1 IMPLANT
TOWEL GREEN STERILE FF (TOWEL DISPOSABLE) IMPLANT
WATER STERILE IRR 1000ML POUR (IV SOLUTION) ×1 IMPLANT

## 2023-09-04 NOTE — Plan of Care (Signed)
  Problem: Clinical Measurements: Goal: Postoperative complications will be avoided or minimized Outcome: Progressing   Problem: Respiratory: Goal: Ability to achieve and maintain a regular respiratory rate will improve Outcome: Progressing   Problem: Skin Integrity: Goal: Demonstration of wound healing without infection will improve Outcome: Progressing   Problem: Clinical Measurements: Goal: Ability to maintain clinical measurements within normal limits will improve Outcome: Progressing Goal: Will remain free from infection Outcome: Progressing Goal: Diagnostic test results will improve Outcome: Progressing Goal: Respiratory complications will improve Outcome: Progressing Goal: Cardiovascular complication will be avoided Outcome: Progressing

## 2023-09-04 NOTE — Anesthesia Procedure Notes (Addendum)
Procedure Name: Intubation Date/Time: 09/04/2023 7:50 AM  Performed by: Sharyn Dross, CRNAPre-anesthesia Checklist: Patient identified, Emergency Drugs available, Suction available and Patient being monitored Patient Re-evaluated:Patient Re-evaluated prior to induction Oxygen Delivery Method: Circle system utilized Preoxygenation: Pre-oxygenation with 100% oxygen Induction Type: IV induction Ventilation: Mask ventilation without difficulty and Oral airway inserted - appropriate to patient size Laryngoscope Size: Glidescope and 4 Grade View: Grade I Tube type: Oral Tube size: 7.5 mm Number of attempts: 1 Airway Equipment and Method: Stylet, Oral airway and Video-laryngoscopy Placement Confirmation: ETT inserted through vocal cords under direct vision, positive ETCO2 and breath sounds checked- equal and bilateral Secured at: 22 cm Tube secured with: Tape Dental Injury: Teeth and Oropharynx as per pre-operative assessment

## 2023-09-04 NOTE — H&P (Signed)
[facility] [place]   Patient name: [name]       MRN: [id number]        DOB: 1955/12/01          Sex: male     REASON FOR VISIT:      Follow up   HISOTRY OF PRESENT ILLNESS:      [name] is a 67 y.o. male who has undergone the following procedures:   02/17/2022: Repair of left common femoral pseudoaneurysm, left leg angiography, left femoral endarterectomy with bovine patch angioplasty (Dr. Chestine Spore) 08/28/2022: Left SFA and popliteal stent (rest pain, Dr. Chestine Spore) 02/13/2022: Atherectomy, DCB (Dr. Allyson Sabal, claudication)   He recently had a ultrasound that showed his left leg stents to be occluded this appears to be a silent occlusion and that he is able to tolerate his symptoms.   The patient has a known history of a chronic left carotid occlusion, however he was unaware of this.[name]  He recently had a duplex that showed stenosis greater than 80% on the right.  He is sent for further evaluation.  He denies any neurologic symptoms such as numbness or weakness in either extremity, slurred speech, or amaurosis fugax.  I sent him for a CT scan to better evaluate this.  He is back today for discussions       Patient has a history of coronary artery disease.  He has undergone multiple PCI procedures.  She is a current smoker.  She is on Repatha for hypercholesterolemia.  She is medically managed for hypertension.     PAST MEDICAL HISTORY:        Past Medical History:  Diagnosis Date   Cervical spine ankylosis     CKD (chronic kidney disease) stage 3, GFR 30-59 ml/min (HCC)     COLONIC POLYPS, HX OF     CORONARY ARTERY DISEASE      a. s/p stent to RCA 2005. b. Stent to Cx 2007. c.  CABG X4 2014; d. LHC 04/19/18 occluded SVG-Circ, DES to in-stent restenosis of circ.    COVID-19 2020   Diverticulosis     DIVERTICULOSIS, COLON     DYSPHAGIA UNSPECIFIED      intermittent   GERD     HEMORRHOIDS     HYPERLIPIDEMIA     HYPERTENSION      HYPERTRIGLYCERIDEMIA     Left carotid artery occlusion     OBESITY     Postoperative atrial fibrillation (HCC) 2014   Pseudoaneurysm of left femoral artery (HCC) 02/16/2022   SLEEP APNEA      on CPAP   Statin intolerance     Tobacco abuse     Vertebral artery occlusion, left              FAMILY HISTORY:         Family History  Problem Relation Age of Onset   Cancer Mother     Heart disease Brother            SOCIAL HISTORY:    Social History         Tobacco Use   Smoking status: Every Day      Current packs/day: 1.00      Average packs/day: 1 pack/day for 43.0 years (43.0 ttl pk-yrs)      Types: Cigarettes      Passive exposure: Current (Wife)   Smokeless tobacco: Never   Tobacco comments:      Pt still smoking  1 PPD 09/02/2022 PAP  Substance Use Topics   Alcohol use: Yes      Comment: 1-2 drink occasionally        ALLERGIES:    Allergies       Allergies  Allergen Reactions   Statins Other (See Comments)      Bone and muscle pain   Crestor [Rosuvastatin]        myalgias   Lipitor [Atorvastatin]        myalgias   Oxycodone Nausea And Vomiting   Hydrocodone Nausea And Vomiting          CURRENT MEDICATIONS:          Current Outpatient Medications  Medication Sig Dispense Refill   acetaminophen (TYLENOL) 500 MG tablet Take 1,000 mg by mouth every 6 (six) hours as needed for mild pain.       amLODipine (NORVASC) 10 MG tablet Take 1 tablet (10 mg total) by mouth daily. 90 tablet 3   aspirin EC 81 MG tablet Take 1 tablet (81 mg total) by mouth daily. 90 tablet 3   clopidogrel (PLAVIX) 75 MG tablet Take 1 tablet (75 mg total) by mouth daily. 90 tablet 3   cyanocobalamin (VITAMIN B12) 1000 MCG tablet Take 1,000 mcg by mouth daily.       HYDROmorphone (DILAUDID) 2 MG tablet Take 1 tablet (2 mg total) by mouth every 4 (four) hours as needed for severe pain. 10 tablet 0   losartan-hydrochlorothiazide (HYZAAR) 100-12.5 MG tablet Take 1 tablet by mouth  daily. 90 tablet 3   nitroGLYCERIN (NITROSTAT) 0.4 MG SL tablet Place 1 tablet (0.4 mg total) under the tongue every 5 (five) minutes as needed for chest pain. 25 tablet 3   pantoprazole (PROTONIX) 40 MG tablet Take 1 tablet (40 mg total) by mouth daily. 90 tablet 2   Probiotic Product (PROBIOTIC-10 ULTIMATE) CAPS Take 1 capsule by mouth daily.       REPATHA SURECLICK 140 MG/ML SOAJ INJECT ONE ML INTO THE SKIN EVERY 14 DAYS. 6 mL 3      No current facility-administered medications for this visit.        REVIEW OF SYSTEMS:    [X]  denotes positive finding, [ ]  denotes negative finding Cardiac   Comments:  Chest pain or chest pressure:      Shortness of breath upon exertion:      Short of breath when lying flat:      Irregular heart rhythm:             Vascular      Pain in calf, thigh, or hip brought on by ambulation:      Pain in feet at night that wakes you up from your sleep:       Blood clot in your veins:      Leg swelling:              Pulmonary      Oxygen at home:      Productive cough:       Wheezing:              Neurologic      Sudden weakness in arms or legs:       Sudden numbness in arms or legs:       Sudden onset of difficulty speaking or slurred speech:      Temporary loss of vision in one eye:       Problems with dizziness:  Gastrointestinal      Blood in stool:       Vomited blood:              Genitourinary      Burning when urinating:       Blood in urine:             Psychiatric      Major depression:              Hematologic      Bleeding problems:      Problems with blood clotting too easily:             Skin      Rashes or ulcers:             Constitutional      Fever or chills:          PHYSICAL EXAM:    There were no vitals filed for this visit.   GENERAL: The patient is a well-nourished male, in no acute distress. The vital signs are documented above. CARDIAC: There is a regular rate and rhythm.  PULMONARY:  Non-labored respirations.  MUSCULOSKELETAL: There are no major deformities or cyanosis. NEUROLOGIC: No focal weakness or paresthesias are detected. SKIN: There are no ulcers or rashes noted. PSYCHIATRIC: The patient has a normal affect.   STUDIES:    I have reviewed the following CTA: 1. 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 A radiographic string sign is present. Minimal attenuated intermittent flow distally with occlusion by the skull base. Distal reconstitution at the left carotid siphon with additional severe stenosis involving the paraclinoid left ICA. 2. 80% atheromatous stenosis about the right carotid bulb. 3. Severe 70% stenosis involving the proximal right V4 segment just distal to the dural reflection. The vertebral arteries are otherwise widely patent within the neck. 4. 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 ENT referral for further workup and evaluation suggested. 5. Few scattered pulmonary nodules measuring up to 6 mm within the visualized upper lungs. The patient has recently undergone a lung cancer screening chest CT on 07/28/2023. Please refer to this exam for follow-up recommendations regarding these findings.   MEDICAL ISSUES:    Carotid: I discussed the CT scan findings with him.  He has a known left carotid occlusion greater than 80% right-sided carotid stenosis.  He is a location of his lesion and his age, I have recommended proceeding with right carotid endarterectomy.  I discussed the risks and benefits of the operation.  We discussed the risk of stroke and nerve injury as well as numbness along the incision site.  All questions were answered and he wishes to proceed.  I will hold his Plavix 5 days prior to his operation   Parotid Mass: This was a incidental finding on his CT scan.  I discussed this with him and will make a referral to ENT for  further evaluation       Durene Cal, IV, MD, FACS [facility] [phone] Pager [phone]

## 2023-09-04 NOTE — Anesthesia Procedure Notes (Signed)
Arterial Line Insertion Start/End11/05/2023 7:58 AM, 09/04/2023 8:03 AM Performed by: Gaynelle Adu, MD, anesthesiologist  Patient location: Pre-op. Preanesthetic checklist: patient identified, IV checked, site marked, risks and benefits discussed, surgical consent, monitors and equipment checked, pre-op evaluation, timeout performed and anesthesia consent Lidocaine 1% used for infiltration Left, radial was placed Catheter size: 20 G Hand hygiene performed , maximum sterile barriers used  and Seldinger technique used  Attempts: 1 Procedure performed using ultrasound guided technique. Ultrasound Notes:anatomy identified, needle tip was noted to be adjacent to the nerve/plexus identified, no ultrasound evidence of intravascular and/or intraneural injection and image(s) printed for medical record Following insertion, dressing applied and Biopatch. Post procedure assessment: normal and unchanged  Post procedure complications: unsuccessful attempts and second provider assisted. Patient tolerated the procedure well with no immediate complications.

## 2023-09-04 NOTE — Transfer of Care (Addendum)
Immediate Anesthesia Transfer of Care Note  Patient: [name]  Procedure(s) Performed: ENDARTERECTOMY CAROTID (Right: Neck)  Patient Location: PACU  Anesthesia Type:General  Level of Consciousness: awake, alert , and oriented  Airway & Oxygen Therapy: Patient Spontanous Breathing and Patient connected to face mask  Post-op Assessment: Report given to RN, Post -op Vital signs reviewed and stable, and Patient moving all extremities  Post vital signs: Reviewed and stable  Last Vitals:  Vitals Value Taken Time  BP 90/46 09/04/23 1004  Temp    Pulse 76 09/04/23 1006  Resp 21 09/04/23 1006  SpO2 93 % 09/04/23 1006  Vitals shown include unfiled device data.  Last Pain:  Vitals:   09/04/23 0619  TempSrc:   PainSc: 0-No pain         Complications: There were no known notable events for this encounter.

## 2023-09-04 NOTE — Op Note (Signed)
Patient name: [name] MRN: [id number] DOB: 1956/03/26 Sex: male  09/04/2023 Pre-operative Diagnosis: Asymptomatic  right carotid stenosis Post-operative diagnosis:  Same Surgeon:  Durene Cal Assistants:  Sena Hitch, PA Procedure:    right carotid Endarterectomy with bovine pericardial patch angioplasty Anesthesia:  General Blood Loss:  100 Specimens:  none  Findings:  90 %stenosis; Thrombus:  none  Indications:  This is a 67 year old male with known left carotid occlusion who has developed progressive asymptomatic right carotid stenosis, now greater than 80% by ultrasound and CTA.  He comes in today for right CEA.  Procedure:  The patient was identified in the holding area and taken to [facility] OR ROOM 11  The patient was then placed supine on the table.   General endotrachial anesthesia was administered.  The patient was prepped and draped in the usual sterile fashion.  A time out was called and antibiotics were administered.  A PA was necessary to expedite the procedure and assist with technical details.  She help with exposure by providing suction and retraction.  She help with the patch repair by following the suture.  She help with wound closure.  The incision was made along the anterior border of the right sternocleidomastoid muscle.  Cautery was used to dissect through the subcutaneous tissue.  The platysma muscle was divided with cautery.  The internal jugular vein was exposed along its anterior medial border.  The common facial vein was exposed and then divided between 2-0 silk ties and metal clips.  The common carotid artery was then circumferentially exposed and encircled with an umbilical tape.  The vagus nerve was identified and protected.  Next sharp dissection was used to expose the external carotid artery and the superior thyroid artery.  The were encircled with a blue vessel loop and a 2-0 silk tie respectively.  Finally, the internal carotid was carefully dissected free.   An umbilical tape was placed around the internal carotid artery distal to the diseased segment.  The hypoglossal nerve was visualized throughout and protected.  The patient was given systemic heparinization.  A bovine carotid patch was selected and prepared on the back table.  A 10 french shunt was also prepared.  After blood pressure readings were appropriate and the heparin had been given time to circulate, the internal carotid artery was occluded with a baby Gregory clamp.  The external and common carotid arteries were then occluded with vascular clamps and the 2-0 tie tightened on the superior thyroid artery.  A #11 blade was used to make an arteriotomy in the common carotid artery.  This was extended with Potts scissors along the anterior and lateral border of the common and internal carotid artery.  Approximately 90% stenosis was identified.  There was no thrombus identified.  The 10 french shunt was placed, as there was pulsatile backbleeding.  A kleiner kuntz elevator was used to perform endarterectomy.  An eversion endarterectomy was performed in the external carotid artery.  A good distal endpoint was obtained in the internal carotid artery.  The specimen was removed and sent to pathology.  Heparinized saline was used to irrigate the endarterectomized field.  All potential embolic debris was removed.  Bovine pericardial patch angioplasty was then performed using a running 6-0 Prolene.  The common internal and external carotid arteries were all appropriately flushed. The artery was again irrigated with heparin saline.  The anastomosis was then secured. The clamp was first released on the external carotid artery followed by  the common carotid artery approximately 30 seconds later, bloodflow was reestablish through the internal carotid artery.  Next, a hand-held  Doppler was used to evaluate the signals in the common, external, and internal  carotid arteries, all of which had appropriate signals. I then  administered  50 mg protamine. The wound was then irrigated.  After hemostasis was achieved, the carotid sheath was reapproximated with 3-0 Vicryl. The  platysma muscle was reapproximated with running 3-0 Vicryl. The skin  was closed with 4-0 Vicryl. Dermabond was placed on the skin. The  patient was then successfully extubated. His neurologic exam was  similar to his preprocedural exam. The patient was then taken to recovery room  in stable condition. There were no complications.     Disposition:  To PACU in stable condition.  Relevant Operative Details: Approximately 90% stenosis at the carotid bifurcation.  Endarterectomy was performed.  In the  distal common carotid artery where the endarterectomy ended, the plane was too thin, and so I ran a 6-0 Prolene on the posterior wall to make sure there was a healthy posterior layer.  A bovine pericardial patch was utilized.  I did not place a shunt as there was pulsatile backbleeding.  He awoke neurologically intact.  Juleen China, M.D., [facility] [place] Office: [phone] Pager:  [phone]

## 2023-09-04 NOTE — Anesthesia Postprocedure Evaluation (Signed)
Anesthesia Post Note  Patient: [name]  Procedure(s) Performed: ENDARTERECTOMY CAROTID (Right: Neck)     Patient location during evaluation: PACU Anesthesia Type: General Level of consciousness: awake and alert Pain management: pain level controlled Vital Signs Assessment: post-procedure vital signs reviewed and stable Respiratory status: spontaneous breathing, nonlabored ventilation, respiratory function stable and patient connected to nasal cannula oxygen Cardiovascular status: blood pressure returned to baseline and stable Postop Assessment: no apparent nausea or vomiting Anesthetic complications: no   There were no known notable events for this encounter.  Last Vitals:  Vitals:   09/04/23 1233 09/04/23 1245  BP:  (!) 107/50  Pulse: 74 61  Resp: (!) 25 19  Temp:  (!) 36.4 C  SpO2: 92% 95%    Last Pain:  Vitals:   09/04/23 1233  TempSrc:   PainSc: Asleep    LLE Motor Response: Responds to commands (09/04/23 1245)   RLE Motor Response: Responds to commands (09/04/23 1245)        Mariann Barter

## 2023-09-05 ENCOUNTER — Encounter (HOSPITAL_COMMUNITY): Payer: Self-pay | Admitting: Surgery

## 2023-09-05 LAB — LIPID PANEL
Cholesterol: 80 mg/dL (ref 0–200)
HDL: 36 mg/dL — ABNORMAL LOW (ref >40)
LDL Cholesterol: 31 mg/dL (ref 0–99)
Total CHOL/HDL Ratio: 2.2 {ratio}
Triglycerides: 66 mg/dL (ref <150)
VLDL: 13 mg/dL (ref 0–40)

## 2023-09-05 LAB — BASIC METABOLIC PANEL
Anion gap: 5 (ref 5–15)
BUN: 24 mg/dL — ABNORMAL HIGH (ref 8–23)
CO2: 21 mmol/L — ABNORMAL LOW (ref 22–32)
Calcium: 8.3 mg/dL — ABNORMAL LOW (ref 8.9–10.3)
Chloride: 106 mmol/L (ref 98–111)
Creatinine, Ser: 1.81 mg/dL — ABNORMAL HIGH (ref 0.61–1.24)
GFR, Estimated: 40 mL/min — ABNORMAL LOW (ref >60)
Glucose, Bld: 131 mg/dL — ABNORMAL HIGH (ref 70–99)
Potassium: 4.7 mmol/L (ref 3.5–5.1)
Sodium: 132 mmol/L — ABNORMAL LOW (ref 135–145)

## 2023-09-05 LAB — CBC
HCT: 33.6 % — ABNORMAL LOW (ref 39.0–52.0)
Hemoglobin: 11.5 g/dL — ABNORMAL LOW (ref 13.0–17.0)
MCH: 30.4 pg (ref 26.0–34.0)
MCHC: 34.2 g/dL (ref 30.0–36.0)
MCV: 88.9 fL (ref 80.0–100.0)
Platelets: 179 10*3/uL (ref 150–400)
RBC: 3.78 MIL/uL — ABNORMAL LOW (ref 4.22–5.81)
RDW: 11.8 % (ref 11.5–15.5)
WBC: 15.3 10*3/uL — ABNORMAL HIGH (ref 4.0–10.5)
nRBC: 0 % (ref 0.0–0.2)

## 2023-09-05 LAB — POCT ACTIVATED CLOTTING TIME
Activated Clotting Time: 262 s
Activated Clotting Time: 239 s

## 2023-09-05 NOTE — Progress Notes (Addendum)
[facility] [place]  Subjective  - Did well over night, no new complaints   Objective 116/60 67 97.6 F (36.4 C) (Axillary) 20 94%  Intake/Output Summary (Last 24 hours) at 09/05/2023 0454 Last data filed at 09/05/2023 0736 Gross per 24 hour  Intake 1898.13 ml  Output 270 ml  Net 1628.13 ml   Moving all extremities No tongue deviation or facial droop Right neck incision healing well without hematoma Speech clear tolerating PO's Lungs non labored breathing   Assessment/Planning: POD # 1 right CEA by Dr. Myra Gianotti  No neurologic deficits Plan for discharge wit f/u in 2 weeks to check incision  Cont ASA and Plavix daily, he has an allergy to Statins   Mosetta Pigeon 09/05/2023 8:11 AM --  Laboratory Lab Results: No results for input(s): "WBC", "HGB", "HCT", "PLT" in the last 72 hours. BMET No results for input(s): "NA", "K", "CL", "CO2", "GLUCOSE", "BUN", "CREATININE", "CALCIUM" in the last 72 hours.  COAG Lab Results  Component Value Date   INR 1.0 08/26/2023   INR 1.1 02/17/2022   INR 1.08 03/10/2017   No results found for: "PTT"  VASCULAR STAFF ADDENDUM: I have independently interviewed and examined the patient. I agree with the above.  Plan for discharge today  Daria Pastures MD [facility] Phone Number: [phone] 09/05/2023 11:16 AM

## 2023-09-05 NOTE — Progress Notes (Signed)
PHARMACIST LIPID MONITORING   [name] is a 67 y.o. male admitted on 09/04/2023 with carotid stenosis requiring endarterectomy.  Pharmacy has been consulted to optimize lipid-lowering therapy with the indication of secondary prevention for clinical ASCVD.  Recent Labs:  Lipid Panel (last 6 months):   Lab Results  Component Value Date   CHOL 80 09/05/2023   TRIG 66 09/05/2023   HDL 36 (L) 09/05/2023   CHOLHDL 2.2 09/05/2023   VLDL 13 09/05/2023   LDLCALC 31 09/05/2023    Hepatic function panel (last 6 months):   Lab Results  Component Value Date   AST 22 08/26/2023   ALT 22 08/26/2023   ALKPHOS 44 08/26/2023   BILITOT 0.5 08/26/2023    SCr (since admission):   Serum creatinine: 1.81 mg/dL (H) 11/91/47 8295 Estimated creatinine clearance: 43.9 mL/min (A)  Current therapy and lipid therapy tolerance Current lipid-lowering therapy: Repatha Previous lipid-lowering therapies (if applicable): Rosuvastatin, Atorvastatin  Documented or reported allergies or intolerances to lipid-lowering therapies (if applicable): myalgias to both rosuvastatin and atorvastatin   Assessment:   Patient prefers no changes in lipid-lowering therapy at this time due to no issues with Repatha.  [name], a pleasant 67 y/o male, says he has no trouble obtaining or affording repatha. He said he once received a defective pen but that it was replaced immediately - and he was happy with this. He prefers not to trial a statin again. He knows that "his cholesterol has been good lately". I did explain that statin therapy is usually recommended for patients who have had an endarterectomy. No changes at this time due to shared decision making.   Plan:    1.Statin intensity (high intensity recommended for all patients regardless of the LDL):  No changes due to stability on Repatha, lod LDL, and patient preference.   2.Add ezetimibe (if any one of the following):   Not indicated at this time.  3.Refer to  lipid clinic:   No  4.Follow-up with:  Primary care provider - Donita Brooks, MD  5.Follow-up labs after discharge:  Changes in lipid therapy were made. Check a lipid panel in 8-12 weeks then annually.      Blane Ohara, PharmD  PGY2 Pharmacy Resident

## 2023-09-05 NOTE — Discharge Instructions (Signed)
   [facility]  Discharge Instructions   Carotid Endarterectomy (CEA)  Please refer to the following instructions for your post-procedure care. Your surgeon or physician assistant will discuss any changes with you.  Activity  You are encouraged to walk as much as you can. You can slowly return to normal activities but must avoid strenuous activity and heavy lifting until your doctor tell you it's OK. Avoid activities such as vacuuming or swinging a golf club. You can drive after one week if you are comfortable and you are no longer taking prescription pain medications. It is normal to feel tired for serval weeks after your surgery. It is also normal to have difficulty with sleep habits, eating, and bowel movements after surgery. These will go away with time.  Bathing/Showering  You may shower after you come home. Do not soak in a bathtub, hot tub, or swim until the incision heals completely.  Incision Care  Shower every day. Clean your incision with mild soap and water. Pat the area dry with a clean towel. You do not need a bandage unless otherwise instructed. Do not apply any ointments or creams to your incision. You may have skin glue on your incision. Do not peel it off. It will come off on its own in about one week. Your incision may feel thickened and raised for several weeks after your surgery. This is normal and the skin will soften over time. For Men Only: It's OK to shave around the incision but do not shave the incision itself for 2 weeks. It is common to have numbness under your chin that could last for several months.  Diet  Resume your normal diet. There are no special food restrictions following this procedure. A low fat/low cholesterol diet is recommended for all patients with vascular disease. In order to heal from your surgery, it is CRITICAL to get adequate nutrition. Your body requires vitamins, minerals, and protein. Vegetables are the best  source of vitamins and minerals. Vegetables also provide the perfect balance of protein. Processed food has little nutritional value, so try to avoid this.        Medications  Resume taking all of your medications unless your doctor or physician assistant tells you not to. If your incision is causing pain, you may take over-the- counter pain relievers such as acetaminophen (Tylenol). If you were prescribed a stronger pain medication, please be aware these medications can cause nausea and constipation. Prevent nausea by taking the medication with a snack or meal. Avoid constipation by drinking plenty of fluids and eating foods with a high amount of fiber, such as fruits, vegetables, and grains. Do not take Tylenol if you are taking prescription pain medications.  Follow Up  Our office will schedule a follow up appointment 2-3 weeks following discharge.  Please call us immediately for any of the following conditions  Increased pain, redness, drainage (pus) from your incision site. Fever of 101 degrees or higher. If you should develop stroke (slurred speech, difficulty swallowing, weakness on one side of your body, loss of vision) you should call 911 and go to the nearest emergency room.  Reduce your risk of vascular disease:  Stop smoking. If you would like help call [facility] at 1-800-QUIT-NOW (1-800-784-8669) or [facility] at 336-586-4000. Manage your cholesterol Maintain a desired weight Control your diabetes Keep your blood pressure down  If you have any questions, please call the office at [phone].   

## 2023-09-07 NOTE — Discharge Summary (Signed)
[facility] Discharge Summary   Patient ID:  [name] MRN: [id number] DOB/AGE: Mar 26, 1956 67 y.o.  Admit date: 09/04/2023 Discharge date: 09/05/23 Date of Surgery: 09/04/2023 Surgeon: Surgeon(s): Nada Libman, MD  Admission Diagnosis: Carotid stenosis [I65.29]  Discharge Diagnoses:  Carotid stenosis [I65.29]  Secondary Diagnoses: Past Medical History:  Diagnosis Date   Cervical spine ankylosis    CKD (chronic kidney disease) stage 3, GFR 30-59 ml/min (HCC)    COLONIC POLYPS, HX OF    CORONARY ARTERY DISEASE    a. s/p stent to RCA 2005. b. Stent to Cx 2007. c.  CABG X4 2014; d. LHC 04/19/18 occluded SVG-Circ, DES to in-stent restenosis of circ.    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 He comes in today for right CEA.    Hospital Course:  [name] is a 67 y.o. male is S/P Right Procedure(s): ENDARTERECTOMY CAROTID Uneventful stay over night.  Neck incision healing well without hematoma.  No neurologic deficits.  No tongue deviation or facial droop.  Speech clear.  Discharged POD # 1 in stble condition f/u in 2 weeks arranged.  Cont ASA and Plavix daily, he has an allergy to Statins    Significant Diagnostic Studies: CBC Lab Results  Component Value Date   WBC 15.3 (H) 09/05/2023   HGB 11.5 (L)  09/05/2023   HCT 33.6 (L) 09/05/2023   MCV 88.9 09/05/2023   PLT 179 09/05/2023    BMET    Component Value Date/Time   NA 132 (L) 09/05/2023 0709   NA 137 01/22/2022 1511   K 4.7 09/05/2023 0709   CL 106 09/05/2023 0709   CO2 21 (L) 09/05/2023 0709   GLUCOSE 131 (H) 09/05/2023 0709   BUN 24 (H) 09/05/2023 0709   BUN 18 01/22/2022 1511   CREATININE 1.81 (H) 09/05/2023 0709   CREATININE 1.59 (H) 07/10/2023 0806   CALCIUM 8.3 (L) 09/05/2023 0709   GFRNONAA 40 (L) 09/05/2023 0709   GFRAA 59 (L) 08/03/2020 0737   COAG Lab Results  Component Value Date   INR 1.0 08/26/2023   INR 1.1 02/17/2022   INR 1.08 03/10/2017     Disposition:  Discharge to :Home Discharge Instructions     Activity as tolerated - No restrictions   Complete by: As directed    Call MD for:  redness, tenderness, or signs of infection (pain, swelling, bleeding, redness, odor or green/yellow discharge around incision site)   Complete by: As directed    Call MD for:  severe or increased pain, loss or decreased feeling  in affected limb(s)   Complete by: As directed    Call MD for:  temperature >100.5   Complete by: As directed    Increase activity slowly  Complete by: As directed    Walk with assistance use walker or cane as needed   May shower    Complete by: As directed    Resume previous diet   Complete by: As directed       Allergies as of 09/05/2023       Reactions   Statins Other (See Comments)   Bone and muscle pain   Crestor [rosuvastatin]    myalgias   Lipitor [atorvastatin]    myalgias   Oxycodone Nausea And Vomiting   Hydrocodone Nausea And Vomiting        Medication List     TAKE these medications    acetaminophen 500 MG tablet Commonly known as: TYLENOL Take 1,000 mg by mouth every 6 (six) hours as needed for mild pain.   amLODipine 10 MG tablet Commonly known as: NORVASC Take 1 tablet (10 mg total) by mouth daily.   aspirin EC 81 MG tablet Take 1 tablet (81 mg  total) by mouth daily.   cetirizine 10 MG tablet Commonly known as: ZYRTEC Take 1 tablet (10 mg total) by mouth daily.   clopidogrel 75 MG tablet Commonly known as: PLAVIX Take 1 tablet (75 mg total) by mouth daily.   fluticasone 50 MCG/ACT nasal spray Commonly known as: FLONASE Place 2 sprays into both nostrils daily.   HYDROmorphone 2 MG tablet Commonly known as: Dilaudid Take 1 tablet (2 mg total) by mouth every 4 (four) hours as needed for severe pain.   losartan-hydrochlorothiazide 100-12.5 MG tablet Commonly known as: HYZAAR Take 1 tablet by mouth daily.   methocarbamol 500 MG tablet Commonly known as: ROBAXIN Take 500 mg by mouth every 8 (eight) hours as needed for muscle spasms.   nitroGLYCERIN 0.4 MG SL tablet Commonly known as: NITROSTAT Place 1 tablet (0.4 mg total) under the tongue every 5 (five) minutes as needed for chest pain.   pantoprazole 40 MG tablet Commonly known as: PROTONIX Take 1 tablet (40 mg total) by mouth daily.   Probiotic-10 Ultimate Caps Take 1 capsule by mouth daily.   Repatha SureClick 140 MG/ML Soaj Generic drug: Evolocumab INJECT ONE ML INTO THE SKIN EVERY 14 DAYS.       Verbal and written Discharge instructions given to the patient. Wound care per Discharge AVS  Follow-up Information     Nada Libman, MD Follow up in 2 week(s).   Specialties: Vascular Surgery, Cardiology Why: Office will call you to arrange your appt (sent) Contact information: [address] [place] [address] [phone]                 Signed: Mosetta Pigeon 09/07/2023, 10:31 AM --- For [facility]QI Registry use --- Instructions: Press F2 to tab through selections.  Delete question if not applicable.   Modified Rankin score at D/C (0-6): Rankin Score=0  IV medication needed for:  1. Hypertension: No 2. Hypotension: Yes  Post-op Complications: No  1. Post-op CVA or TIA: No  If yes: Event classification (right eye, left eye, right  cortical, left cortical, verterobasilar, other):   If yes: Timing of event (intra-op, <6 hrs post-op, >=6 hrs post-op, unknown):   2. CN injury: No  If yes: CN  injuried   3. Myocardial infarction: No  If yes: Dx by (EKG or clinical, Troponin):   4.  CHF: No  5.  Dysrhythmia (new): No  6. Wound infection: No  7. Reperfusion symptoms: No  8. Return to OR: No  If yes: return  to OR for (bleeding, neurologic, other CEA incision, other):   Discharge medications: Statin use:  No  for medical reason allergy ASA use: YES Beta blocker use:  No  for medical reason not indicated ACE-Inhibitor use:  Yes P2Y12 Antagonist use: [ ]  None, [x ] Plavix, [ ]  Plasugrel, [ ]  Ticlopinine, [ ]  Ticagrelor, [ ]  Other, [ ]  No for medical reason, [ ]  Non-compliant, [ ]  Not-indicated Anti-coagulant use:  [ ]  None, [ ]  Warfarin, [ ]  Rivaroxaban, [ ]  Dabigatran, [ ]  Other, [ ]  No for medical reason, [ ]  Non-compliant, [x ] Not-indicated

## 2023-09-08 ENCOUNTER — Telehealth: Payer: Self-pay

## 2023-09-08 NOTE — Telephone Encounter (Signed)
Patient called and left a message on the triage on line stating he had some drainage from him incision on his neck.  After reviewing patient's chart, I returned his call. With two identifiers I spoke with the patient. Pt stated that he noted a scant amount of clear drainage from his neck incision. Patient had a right carotid endarterectomy on Friday with Dr. Myra Gianotti. Patient stated that there was no redness, swelling or odor noted. He denied any pain, just tenderness. Patient stated that he feels good otherwise. Denied any headache, visual changes or facial weakness/drooping. He stated his BP has been "normal" for him with this mornings reading being 157/80.  I told patient that he could place a small piece of gauze over the area if the drainage continued. If it became more or was bloody to call the office back. Otherwise we would see him as planned in post op. Patient in agreement with this plan.

## 2023-09-12 ENCOUNTER — Encounter: Payer: Self-pay | Admitting: Internal Medicine

## 2023-09-12 NOTE — Assessment & Plan Note (Signed)
Benefits from CPAP with good compliance and control Plan- continue auto 5-15 

## 2023-09-12 NOTE — Assessment & Plan Note (Signed)
Minimal symptoms given long smoking hx. Plan- emphasized smoking cessation

## 2023-09-28 ENCOUNTER — Ambulatory Visit (INDEPENDENT_AMBULATORY_CARE_PROVIDER_SITE_OTHER): Payer: BC Managed Care – PPO | Admitting: Otolaryngology

## 2023-09-28 ENCOUNTER — Ambulatory Visit (HOSPITAL_COMMUNITY)
Admission: RE | Admit: 2023-09-28 | Discharge: 2023-09-28 | Disposition: A | Discharge status: Home / Self Care | Payer: BC Managed Care – PPO | Source: Ambulatory Visit | Attending: Otolaryngology | Admitting: Otolaryngology

## 2023-09-28 ENCOUNTER — Encounter (INDEPENDENT_AMBULATORY_CARE_PROVIDER_SITE_OTHER): Payer: Self-pay | Admitting: Otolaryngology

## 2023-09-28 ENCOUNTER — Ambulatory Visit (INDEPENDENT_AMBULATORY_CARE_PROVIDER_SITE_OTHER): Payer: BC Managed Care – PPO | Admitting: Physician Assistant

## 2023-09-28 VITALS — BP 146/76 | HR 73 | Temp 98.1°F | Ht 69.0 in | Wt 215.2 lb

## 2023-09-28 VITALS — BP 152/72 | HR 75

## 2023-09-28 DIAGNOSIS — J343 Hypertrophy of nasal turbinates: Secondary | ICD-10-CM

## 2023-09-28 DIAGNOSIS — D119 Benign neoplasm of major salivary gland, unspecified: Secondary | ICD-10-CM

## 2023-09-28 DIAGNOSIS — J3489 Other specified disorders of nose and nasal sinuses: Secondary | ICD-10-CM | POA: Diagnosis not present

## 2023-09-28 DIAGNOSIS — Z720 Tobacco use: Secondary | ICD-10-CM | POA: Diagnosis not present

## 2023-09-28 DIAGNOSIS — H9319 Tinnitus, unspecified ear: Secondary | ICD-10-CM

## 2023-09-28 DIAGNOSIS — R0981 Nasal congestion: Secondary | ICD-10-CM

## 2023-09-28 DIAGNOSIS — G4733 Obstructive sleep apnea (adult) (pediatric): Secondary | ICD-10-CM

## 2023-09-28 DIAGNOSIS — J342 Deviated nasal septum: Secondary | ICD-10-CM

## 2023-09-28 DIAGNOSIS — K118 Other diseases of salivary glands: Secondary | ICD-10-CM | POA: Diagnosis present

## 2023-09-28 DIAGNOSIS — I6523 Occlusion and stenosis of bilateral carotid arteries: Secondary | ICD-10-CM

## 2023-09-28 MED ORDER — GADOBUTROL 1 MMOL/ML IV SOLN
10.0000 mL | Freq: Once | INTRAVENOUS | Status: AC | PRN
  Administered 2023-09-28: 10 mL via INTRAVENOUS

## 2023-09-28 NOTE — Progress Notes (Signed)
ENT Progress Note:  Update 09/28/23  Discussed the use of AI scribe software for clinical note transcription with the patient, who gave verbal consent to proceed.  History of Present Illness   The patient, with a history of cardiovascular disease and recent carotid endarterectomy 09/04/23 on the right side, presents for follow-up regarding an incidental finding of a mass in the left parotid gland. 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 The patient reports no changes in facial movement or sensation.  In addition to the parotid mass, the patient has been experiencing nasal congestion, which has improved with the use of Flonase. However, he reports occasional epistaxis, which he attributes to the dry weather and use of Flonase.  The patient also mentions a history of chronic ear infections, with a healed perforation left side. 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  The patient also reports a recent work-related back injury, for which he is awaiting a back MRI. He expresses frustration with the delay in scheduling due to workers' compensation.  Lastly, the patient mentions a history of stent placements in the heart and leg, with no other implants. He is currently on Plavix, which was temporarily held for a recent carotid endarterectomy procedure on the right side.      Initial Evaluation 08/12/23  Reason for Consult: left parotid mass   HPI: [name] is an 67 y.o. male with hx of CAD, s/p multiple cardiac stents and CABG, on ASA/Plavix, current smoker, left carotid artery occlusion, scheduled for endarterectomy 09/04/23, hx of OSA on CPAP, here for incidentally noted left parotid mass and chronic nasal congestion/nasal obstruction.   On ASA  and Plavix will be on it indefinitely.   Records Reviewed:  Vascular surgery office note 08/03/2023 Dr Myra Gianotti  02/17/2022: Repair of left common femoral pseudoaneurysm, left leg angiography, left femoral endarterectomy with bovine patch angioplasty (Dr. Chestine Spore) 08/28/2022: Left SFA and popliteal stent (rest pain, Dr. Chestine Spore) 02/13/2022: Atherectomy, DCB (Dr. Allyson Sabal, claudication)   He recently had a ultrasound that showed his left leg stents to be occluded this appears to be a silent occlusion and that he is able to tolerate his symptoms.   The patient has a known history of a chronic left carotid occlusion, however he was unaware of this.[name]  He recently had a duplex that showed stenosis greater than 80% on the right.  He is sent for further evaluation.  He denies any neurologic symptoms such as numbness or weakness in either extremity, slurred speech, or amaurosis fugax.  I sent him for a CT scan to better evaluate this.  He is back today for discussions   Patient has a history of coronary artery disease.  He has undergone multiple PCI procedures.  She is a current smoker.  She is on Repatha for hypercholesterolemia.  She is medically managed for hypertension.   Past Medical History:  Diagnosis Date   Cervical spine ankylosis    CKD (chronic kidney disease) stage 3, GFR 30-59 ml/min (HCC)    COLONIC POLYPS, HX OF    CORONARY ARTERY DISEASE    a. s/p stent to RCA 2005. b. Stent to Cx 2007. c.  CABG X4 2014; d. LHC 04/19/18 occluded SVG-Circ, DES to in-stent restenosis of circ.    COVID-19 2020  Diverticulosis    DIVERTICULOSIS, COLON    DYSPHAGIA UNSPECIFIED    intermittent   GERD    HEMORRHOIDS    HYPERLIPIDEMIA    HYPERTENSION    HYPERTRIGLYCERIDEMIA    Left carotid artery occlusion    OBESITY    Postoperative atrial fibrillation (HCC) 2014   Pseudoaneurysm of left femoral artery (HCC) 02/16/2022   SLEEP APNEA    on CPAP   Statin intolerance    Tobacco abuse    Vertebral artery  occlusion, left     Past Surgical History:  Procedure Laterality Date   ABDOMINAL AORTOGRAM W/LOWER EXTREMITY N/A 02/13/2022   Procedure: ABDOMINAL AORTOGRAM W/LOWER EXTREMITY;  Surgeon: Runell Gess, MD;  Location: [facility];  Service: Cardiovascular;  Laterality: N/A;   ABDOMINAL AORTOGRAM W/LOWER EXTREMITY N/A 08/28/2022   Procedure: ABDOMINAL AORTOGRAM W/LOWER EXTREMITY;  Surgeon: Cephus Shelling, MD;  Location: [facility];  Service: Cardiovascular;  Laterality: N/A;   ACHILLES TENDON REPAIR     BACK SURGERY     lower back   CARDIAC CATHETERIZATION     CATARACT EXTRACTION Bilateral    CORONARY ARTERY BYPASS GRAFT  08/18/2012   Procedure: CORONARY ARTERY BYPASS GRAFTING (CABG);  Surgeon: Alleen Borne, MD;  Location: [facility] OR;  Service: Open Heart Surgery;  Laterality: N/A;  CABG x four;  using left internal mammary artery and right leg greater saphenous vein harvested endoscopically   CORONARY STENT INTERVENTION N/A 04/19/2018   Procedure: CORONARY STENT INTERVENTION;  Surgeon: Runell Gess, MD;  Location: [facility];  Service: Cardiovascular;  Laterality: N/A;   ENDARTERECTOMY Right 09/04/2023   Procedure: ENDARTERECTOMY CAROTID;  Surgeon: Nada Libman, MD;  Location[facility];  Service: Vascular;  Laterality: Right;   ENDARTERECTOMY FEMORAL Left 02/17/2022   Procedure: ENDARTERECTOMY FEMORAL;  Surgeon: Cephus Shelling, MD;  Location: [facility] OR;  Service: Vascular;  Laterality: Left;   INTRAOPERATIVE ARTERIOGRAM Left 02/17/2022   Procedure: INTRA OPERATIVE ARTERIOGRAM;  Surgeon: Cephus Shelling, MD;  Location: [facility] OR;  Service: Vascular;  Laterality: Left;   KNEE ARTHROSCOPY     RIGHT   LEFT HEART CATH AND CORS/GRAFTS ANGIOGRAPHY N/A 04/19/2018   Procedure: LEFT HEART CATH AND CORS/GRAFTS ANGIOGRAPHY;  Surgeon: Runell Gess, MD;  Location: [facility];  Service: Cardiovascular;  Laterality: N/A;   LUMBAR LAMINECTOMY/DECOMPRESSION  MICRODISCECTOMY N/A 12/05/2015   Procedure: LUMBAR 2-3, LUMBAR 3-4 DECOMPRESSION ;  Surgeon: Estill Bamberg, MD;  Location: [facility];  Service: Orthopedics;  Laterality: N/A;   PATCH ANGIOPLASTY Left 02/17/2022   Procedure: PATCH ANGIOPLASTY Left Common Femoral Artery.;  Surgeon: Cephus Shelling, MD;  Location: [facility] OR;  Service: Vascular;  Laterality: Left;   PERIPHERAL VASCULAR ATHERECTOMY  02/13/2022   Procedure: PERIPHERAL VASCULAR ATHERECTOMY;  Surgeon: Runell Gess, MD;  Location: [facility];  Service: Cardiovascular;;  Rt SFA   PERIPHERAL VASCULAR INTERVENTION Left 08/28/2022   Procedure: PERIPHERAL VASCULAR INTERVENTION;  Surgeon: Cephus Shelling, MD;  Location: [facility];  Service: Cardiovascular;  Laterality: Left;  SFA/POP   Right long finger extensor tendon repair     THROMBECTOMY FEMORAL ARTERY Left 02/17/2022   Procedure: Repair of Left COMMON FEMORAL PSEUDOANEURYSM.;  Surgeon: Cephus Shelling, MD;  Location: [facility] OR;  Service: Vascular;  Laterality: Left;    Family History  Problem Relation Age of Onset   Cancer Mother    Heart disease Brother     Social History:  reports that he has been smoking cigarettes. He has a 43 pack-year smoking history. He has been exposed to tobacco smoke. He has never used smokeless tobacco. He reports that he does not currently use alcohol. He reports that he does not use drugs.  Allergies:  Allergies  Allergen Reactions   Statins Other (See Comments)    Bone and muscle pain   Crestor [Rosuvastatin]     myalgias   Lipitor [Atorvastatin]     myalgias   Oxycodone Nausea And Vomiting   Hydrocodone Nausea And Vomiting    Medications: I have reviewed the patient's current medications.  The PMH, PSH, Medications, Allergies, and SH were reviewed and updated.  ROS: Constitutional: Negative for fever, weight loss and weight gain. Cardiovascular: Negative for chest pain and dyspnea on exertion. Respiratory: Is  not experiencing shortness of breath at rest. Gastrointestinal: Negative for nausea and vomiting. Neurological: Negative for headaches. Psychiatric: The patient is not nervous/anxious  Blood pressure (!) 152/72, pulse 75, SpO2 98%.  PHYSICAL EXAM:  Exam: General: Well-developed, well-nourished Respiratory Respiratory effort: Equal inspiration and expiration without stridor Cardiovascular Peripheral Vascular: Warm extremities with equal color/perfusion Eyes: No nystagmus with equal extraocular motion bilaterally Neuro/Psych/Balance: Patient oriented to person, place, and time; Appropriate mood and affect; Gait is intact with no imbalance; Cranial nerves I-XII are intact Head and Face Inspection: Normocephalic and atraumatic without mass or lesion Palpation: Facial skeleton intact without bony stepoffs Salivary Glands: No mass or tenderness Facial Strength: Facial motility symmetric and full bilaterally ENT Pinna: External ear intact and fully developed External canal: Canal is patent with intact skin Tympanic Membrane: Clear and mobile evidence of healed perforation on the left side External Nose: No scar or anatomic deformity Lips, Teeth, and gums: Mucosa and teeth intact and viable TMJ: No pain to palpation with full mobility Oral cavity/oropharynx: No erythema or exudate, no lesions present Neck Neck and Trachea: Midline trachea without mass or lesion Thyroid: No mass or nodularity Lymphatics: No lymphadenopathy  Studies Reviewed: CT Angio  IMPRESSION: 1. 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 A radiographic string sign is present. Minimal attenuated intermittent flow distally with occlusion by the skull base. Distal reconstitution at the left carotid siphon with additional severe stenosis involving the paraclinoid left ICA. 2. 80% atheromatous stenosis about the right carotid bulb. 3. Severe 70% stenosis  involving the proximal right V4 segment just distal to the dural reflection. The vertebral arteries are otherwise widely patent within the neck. 4. 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 ENT referral for further workup and evaluation suggested. 5. Few scattered pulmonary nodules measuring up to 6 mm within the visualized upper lungs. The patient has recently undergone a lung cancer screening chest CT on 07/28/2023. Please refer to this exam for follow-up recommendations regarding these findings.  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   Mass of left parotid gland    Nasal obstruction    Nasal septal deviation    Chronic nasal congestion    Tobacco abuse    Hypertrophy of both inferior nasal turbinates    Obstructive sleep apnea    Warthin's tumor Yes     Incidentally noted left parotid mass on CT angio neck done on 07/29/2023, I personally reviewed the scan and the lesion appears to be solid located in the mid superficial parotid lobe.  I was not  able to feel the lesion on exam today which is likely due to the fact that it is deeper in the superficial lobe of the parotid. - FNA of the left parotid mas -Will likely obtain MRI to better evaluate the size and characteristics of the parotid tumor if surgery is planned in the future -We discussed imaging findings and prognosis for carotid tumors today, we also discussed management options - He will return after biopsy -on aspirin and Plavix, will need clearance due to multiple comorbidities if parotid surgery is planned in the future  2.  Chronic nasal congestion and nasal obstruction.  History of OSA and CPAP use.  History of smoking for many years.  Nasal endoscopy today with evidence of septal deviation and significant narrowing of bilateral nasal passages as well as inferior turbinate hypertrophy and significant  mucosal edema -I discussed exam findings with the patient -I discussed management options for chronic nasal congestion -Start nasal saline rinses -Start Flonase 2 puffs bilateral nares twice daily -Start daily Zyrtec 10 mg  3.  Septal deviation and inferior turban hypertrophy with symptoms of chronic nasal obstruction, worse on the left -Will consider septo/ITR if he fails medical management above  4.  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 Biopsy indicates benign nature, but excision is recommended for definitive diagnosis and ease of removal while mass is small. Major risk of surgery includes facial nerve weakness due to proximity of the facial nerve to the tumor. Nerve monitoring and dissection will be performed during surgery to mitigate this risk. Patient prefers to delay surgery until after his trip in March.   - Order dedicated imaging for the mass  MRI neck with gad - Schedule excision post-imaging   - Hold Plavix prior to surgery  - will need clearance 2/2 medical comorbitidies and hx of smoking and Plavix instructions prior to surgery - Discuss surgery details and risks with patient post-imaging    Chronic Nasal Congestion  - nasal endoscopy done in the past:  Septum is deviated to the left and with a large caudal septal spur, significant narrowing of bilateral nasal passages left> right. No polyps, no purulence. Mucosal edema and erythema present. Bilateral inferior turbinate hypertrophy.  - Improved with Flonase. Occasional epistaxis likely due to dry weather and improper Flonase application.   - Continue Flonase  2 puffs b/l nares BID and Zyrtec 10 mg daily - Instruct  on proper Flonase application (aim up and lateral)   - Add nasal saline spray   - Apply small dab of Vaseline in each nostril twice a day for nasal dryness and to prevent epistaxis - consider smoking cessation  Chronic tinnitus in the setting of noise exposure, deferred Audiogram today   Tobacco use d/o - we discussed importance of quitting, and I offered smoking cessation referral but he would like to hold off at this time. Total of 3 min spent on counseling.   Follow-up   - Schedule follow-up visit before March trip   - Ensure preauthorization for imaging   - Advise patient to contact imaging center if no response in a few days.        Ashok Croon, MD Otolaryngology   [facility] Phone: [phone] Fax: [phone]    09/28/2023, 7:17 PM

## 2023-09-28 NOTE — Progress Notes (Unsigned)
POST OPERATIVE OFFICE NOTE    CC:  F/u for surgery  HPI:  This is a 67 y.o. male who is s/p right carotid endarterectomy with bovine patch angioplasty by Dr. Myra Gianotti on 09/04/2023 due to asymptomatic high-grade stenosis of the right ICA.  He tolerated the procedure well and had an uneventful hospital stay.  He denies any neurological events including slurring speech, changes in vision, or one-sided weakness.  He believes his incision is healing well.  Preoperative workup identified a parotid mass which he has follow-up with ENT today.  He has a known left ICA occlusion.  He is on aspirin and Plavix daily.  He has a statin allergy.  Allergies  Allergen Reactions   Statins Other (See Comments)    Bone and muscle pain   Crestor [Rosuvastatin]     myalgias   Lipitor [Atorvastatin]     myalgias   Oxycodone Nausea And Vomiting   Hydrocodone Nausea And Vomiting    Current Outpatient Medications  Medication Sig Dispense Refill   acetaminophen (TYLENOL) 500 MG tablet Take 1,000 mg by mouth every 6 (six) hours as needed for mild pain.     amLODipine (NORVASC) 10 MG tablet Take 1 tablet (10 mg total) by mouth daily. 90 tablet 3   aspirin EC 81 MG tablet Take 1 tablet (81 mg total) by mouth daily. 90 tablet 3   cetirizine (ZYRTEC) 10 MG tablet Take 1 tablet (10 mg total) by mouth daily. 30 tablet 11   clopidogrel (PLAVIX) 75 MG tablet Take 1 tablet (75 mg total) by mouth daily. 90 tablet 3   fluticasone (FLONASE) 50 MCG/ACT nasal spray Place 2 sprays into both nostrils daily. 16 g 6   fluticasone (FLONASE) 50 MCG/ACT nasal spray Place into the nose.     HYDROmorphone (DILAUDID) 2 MG tablet Take 1 tablet (2 mg total) by mouth every 4 (four) hours as needed for severe pain. 10 tablet 0   losartan-hydrochlorothiazide (HYZAAR) 100-12.5 MG tablet Take 1 tablet by mouth daily. 90 tablet 3   methocarbamol (ROBAXIN) 500 MG tablet Take 500 mg by mouth every 8 (eight) hours as needed for muscle spasms.      nitroGLYCERIN (NITROSTAT) 0.4 MG SL tablet Place 1 tablet (0.4 mg total) under the tongue every 5 (five) minutes as needed for chest pain. 25 tablet 3   pantoprazole (PROTONIX) 40 MG tablet Take 1 tablet (40 mg total) by mouth daily. 90 tablet 2   Probiotic Product (PROBIOTIC-10 ULTIMATE) CAPS Take 1 capsule by mouth daily.     REPATHA SURECLICK 140 MG/ML SOAJ INJECT ONE ML INTO THE SKIN EVERY 14 DAYS. 6 mL 3   No current facility-administered medications for this visit.     ROS:  See HPI  Physical Exam:  Vitals:   09/28/23 1043  BP: (!) 146/76  Pulse: 73  Temp: 98.1 F (36.7 C)  TempSrc: Temporal  SpO2: 91%  Weight: 215 lb 3.2 oz (97.6 kg)  Height: 5\' 9"  (1.753 m)    Incision: Right neck incision well-healed Extremities: Moving all extremities well Neuro: Cranial nerves grossly intact; some peri-incisional numbness  Assessment/Plan:  This is a 67 y.o. male who is s/p: Right sided carotid endarterectomy due to asymptomatic high-grade stenosis  Subjectively, the patient has not experienced any neurological events since discharge from the hospital.  The right neck incision is well-healed.  He has a known contralateral ICA occlusion.  No indication for intervention of occluded left ICA.  He will continue his  aspirin and Plavix daily.  He has a known statin allergy.  We will check a carotid duplex in 9 months per protocol.  Patient also has known occluded stents of the left SFA.  He however is without claudication.  No indication for intervention at this time.  We will also check an ABI when he returns for his carotid duplex follow-up.    Emilie Rutter, PA-C [facility] [phone]  Clinic MD:  Karin Lieu on call

## 2023-09-28 NOTE — Patient Instructions (Addendum)
Schedule MRI neck with contrast Return after imaging  Continue Flonase and Zyrtec Add nasal saline spray and Vaseline to avoid nasal dryness

## 2023-09-29 ENCOUNTER — Encounter: Payer: Self-pay | Admitting: Physician Assistant

## 2023-10-01 ENCOUNTER — Other Ambulatory Visit: Payer: Self-pay

## 2023-10-01 DIAGNOSIS — I6523 Occlusion and stenosis of bilateral carotid arteries: Secondary | ICD-10-CM

## 2023-10-12 ENCOUNTER — Other Ambulatory Visit (INDEPENDENT_AMBULATORY_CARE_PROVIDER_SITE_OTHER): Payer: Self-pay | Admitting: Otolaryngology

## 2023-10-12 DIAGNOSIS — D119 Benign neoplasm of major salivary gland, unspecified: Secondary | ICD-10-CM

## 2023-10-12 DIAGNOSIS — K118 Other diseases of salivary glands: Secondary | ICD-10-CM

## 2023-10-12 NOTE — Progress Notes (Signed)
Referral placed to [facility]NT for surgical consultation after imaging review.

## 2023-10-16 ENCOUNTER — Telehealth: Payer: Self-pay

## 2023-10-16 NOTE — Telephone Encounter (Signed)
Patient had received a denial letter from [facility] IEP#[id number] dated 09/30/2023 for cpt code 66063 (this was a duplicate entry). Original authorization was approved for CPT codes 01601, [id number], [id number] under [employer]N#[id number] on 09/28/2023. Spoke with Dessie Coma. From [facility] claims department telephone [phone]. She verified that MRI will be covered/authorized from the original authorization #[id number]. Called patient, LMVM out come of the authorization to the MRI and call back if he has any further questions.

## 2023-10-30 ENCOUNTER — Ambulatory Visit: Payer: BC Managed Care – PPO | Admitting: Internal Medicine

## 2023-11-04 ENCOUNTER — Telehealth: Payer: Self-pay

## 2023-11-04 NOTE — Telephone Encounter (Signed)
   Pre-operative Risk Assessment    Patient Name: FRED FRANZEN  DOB: 10-Sep-1956 MRN: [id number]   Date of last office visit: 07/17/23 Dr. Maude Emmer Date of next office visit: 12/30/23 Dr. Maude Emmer   Request for Surgical Clearance    Procedure:   LT Carpal Tunnel, LT palm mass excision    Date of Surgery:  Clearance 12/07/23                                Surgeon:  Dr. Franky Curia Surgeon's Group or Practice Name:  [facility] Phone number:  [phone] Fax number:  [phone]   Type of Clearance Requested:   - Medical  - Pharmacy:  Hold Clopidogrel  (Plavix )     Type of Anesthesia:   Choice   Additional requests/questions:    Bonney Huxley Daleigh Pollinger   11/04/2023, 5:00 PM

## 2023-11-05 ENCOUNTER — Telehealth: Payer: Self-pay

## 2023-11-05 NOTE — Telephone Encounter (Signed)
 Called and spoke to patient to schedule telephone visit for preop clearance patient has been scheduled for 1/30 patient voiced understanding med rec and consent done     Patient Consent for Virtual Visit         [name] has provided verbal consent on 11/05/2023 for a virtual visit (video or telephone).   CONSENT FOR VIRTUAL VISIT FOR:  [name]  By participating in this virtual visit I agree to the following:  I hereby voluntarily request, consent and authorize [facility] and its employed or contracted physicians, physician assistants, nurse practitioners or other licensed health care professionals (the Practitioner), to provide me with telemedicine health care services (the "Services) as deemed necessary by the treating Practitioner. I acknowledge and consent to receive the Services by the Practitioner via telemedicine. I understand that the telemedicine visit will involve communicating with the Practitioner through live audiovisual communication technology and the disclosure of certain medical information by electronic transmission. I acknowledge that I have been given the opportunity to request an in-person assessment or other available alternative prior to the telemedicine visit and am voluntarily participating in the telemedicine visit.  I understand that I have the right to withhold or withdraw my consent to the use of telemedicine in the course of my care at any time, without affecting my right to future care or treatment, and that the Practitioner or I may terminate the telemedicine visit at any time. I understand that I have the right to inspect all information obtained and/or recorded in the course of the telemedicine visit and may receive copies of available information for a reasonable fee.  I understand that some of the potential risks of receiving the Services via telemedicine include:  Delay or interruption in medical evaluation due to technological equipment  failure or disruption; Information transmitted may not be sufficient (e.g. poor resolution of images) to allow for appropriate medical decision making by the Practitioner; and/or  In rare instances, security protocols could fail, causing a breach of personal health information.  Furthermore, I acknowledge that it is my responsibility to provide information about my medical history, conditions and care that is complete and accurate to the best of my ability. I acknowledge that Practitioner's advice, recommendations, and/or decision may be based on factors not within their control, such as incomplete or inaccurate data provided by me or distortions of diagnostic images or specimens that may result from electronic transmissions. I understand that the practice of medicine is not an exact science and that Practitioner makes no warranties or guarantees regarding treatment outcomes. I acknowledge that a copy of this consent can be made available to me via my patient portal [facility] MyChart), or I can request a printed copy by calling the office of [facility].    I understand that my insurance will be billed for this visit.   I have read or had this consent read to me. I understand the contents of this consent, which adequately explains the benefits and risks of the Services being provided via telemedicine.  I have been provided ample opportunity to ask questions regarding this consent and the Services and have had my questions answered to my satisfaction. I give my informed consent for the services to be provided through the use of telemedicine in my medical care

## 2023-11-05 NOTE — Telephone Encounter (Signed)
 Dr. Delford  Patient has a history of in-stent restenosis. Per office protocol, will you please provide recommendations for holding Plavix  prior to carpal tunnel release and mass excision from palm?  Please route your response to P CV DIV Preop. I will communicate with requesting office once you have given recommendations.   Thank you!  Barnie Hila, NP

## 2023-11-05 NOTE — Telephone Encounter (Signed)
 Called and spoke to patient who has been scheduled for telephone visit for preop clearance patient voiced understanding

## 2023-11-05 NOTE — Telephone Encounter (Signed)
   Name: [name]  DOB: Nov 13, 1955  MRN: [id number]  Primary Cardiologist: Maude Emmer, MD   Preoperative team, please contact this patient and set up a phone call appointment for further preoperative risk assessment. Please obtain consent and complete medication review. Thank you for your help.  I confirm that guidance regarding antiplatelet and oral anticoagulation therapy has been completed and, if necessary, noted below.  Per Dr. Nishan, patient may hold Plavix  5 days prior to procedure.   I also confirmed the patient resides in the state of [place] . As per [facility] Medical Board telemedicine laws, the patient must reside in the state in which the provider is licensed.   Barnie Hila, NP 11/05/2023, 3:01 PM [facility]

## 2023-11-26 ENCOUNTER — Ambulatory Visit: Payer: 59 | Attending: Cardiology

## 2023-11-26 DIAGNOSIS — Z01810 Encounter for preprocedural cardiovascular examination: Secondary | ICD-10-CM

## 2023-11-26 NOTE — Progress Notes (Signed)
Virtual Visit via Telephone Note   Because of IOKEPA GEFFRE co-morbid illnesses, he is at least at moderate risk for complications without adequate follow up.  This format is felt to be most appropriate for this patient at this time.  The patient did not have access to video technology/had technical difficulties with video requiring transitioning to audio format only (telephone).  All issues noted in this document were discussed and addressed.  No physical exam could be performed with this format.  Please refer to the patient's chart for his consent to telehealth for [facility].  Evaluation Performed:  Preoperative cardiovascular risk assessment _____________   Date:  11/26/2023   Patient ID:  [name], DOB Jul 10, 1956, MRN [id number] Patient Location:  Home Provider location:   Office  Primary Care Provider:  Donita Brooks, MD Primary Cardiologist:  Charlton Haws, MD  Chief Complaint / Patient Profile   68 y.o. y/o male with a h/o coronary artery disease status post CABG x 4 in 2014, PCI of OM 6/19, postoperative atrial fibrillation, and hyperlipidemia who is pending  LT Carpal Tunnel, LT palm mass excision  and presents today for telephonic preoperative cardiovascular risk assessment.  History of Present Illness    [name] is a 68 y.o. male who presents via audio/video conferencing for a telehealth visit today.  Pt was last seen in cardiology clinic on 07/17/2023 by Dr. Eden Emms.  At that time [name] was doing well .  The patient is now pending procedure as outlined above. Since his last visit, he remains stable from a cardiac standpoint.  Today he denies chest pain, shortness of breath, lower extremity edema, fatigue, palpitations, melena, hematuria, hemoptysis, diaphoresis, weakness, presyncope, syncope, orthopnea, and PND.   Past Medical History    Past Medical History:  Diagnosis Date   Cervical spine ankylosis    CKD (chronic kidney  disease) stage 3, GFR 30-59 ml/min (HCC)    COLONIC POLYPS, HX OF    CORONARY ARTERY DISEASE    a. s/p stent to RCA 2005. b. Stent to Cx 2007. c.  CABG X4 2014; d. LHC 04/19/18 occluded SVG-Circ, DES to in-stent restenosis of circ.    COVID-19 2020   Diverticulosis    DIVERTICULOSIS, COLON    DYSPHAGIA UNSPECIFIED    intermittent   GERD    HEMORRHOIDS    HYPERLIPIDEMIA    HYPERTENSION    HYPERTRIGLYCERIDEMIA    Left carotid artery occlusion    OBESITY    Postoperative atrial fibrillation (HCC) 2014   Pseudoaneurysm of left femoral artery (HCC) 02/16/2022   SLEEP APNEA    on CPAP   Statin intolerance    Tobacco abuse    Vertebral artery occlusion, left    Past Surgical History:  Procedure Laterality Date   ABDOMINAL AORTOGRAM W/LOWER EXTREMITY N/A 02/13/2022   Procedure: ABDOMINAL AORTOGRAM W/LOWER EXTREMITY;  Surgeon: Runell Gess, MD;  [facility]: [facility];  Service: Cardiovascular;  Laterality: N/A;   ABDOMINAL AORTOGRAM W/LOWER EXTREMITY N/A 08/28/2022   Procedure: ABDOMINAL AORTOGRAM W/LOWER EXTREMITY;  Surgeon: Cephus Shelling, MD;  Location: [facility];  Service: Cardiovascular;  Laterality: N/A;   ACHILLES TENDON REPAIR     BACK SURGERY     lower back   CARDIAC CATHETERIZATION     CATARACT EXTRACTION Bilateral    CORONARY ARTERY BYPASS GRAFT  08/18/2012   Procedure: CORONARY ARTERY BYPASS GRAFTING (CABG);  Surgeon: Alleen Borne, MD;  Location:  [facility] OR;  Service: Open Heart Surgery;  Laterality: N/A;  CABG x four;  using left internal mammary artery and right leg greater saphenous vein harvested endoscopically   CORONARY STENT INTERVENTION N/A 04/19/2018   Procedure: CORONARY STENT INTERVENTION;  Surgeon: Runell Gess, MD;  [facility]: [facility];  Service: Cardiovascular;  Laterality: N/A;   ENDARTERECTOMY Right 09/04/2023   Procedure: ENDARTERECTOMY CAROTID;  Surgeon: Nada Libman, MD;  Location: [facility] OR;  Service: Vascular;   Laterality: Right;   ENDARTERECTOMY FEMORAL Left 02/17/2022   Procedure: ENDARTERECTOMY FEMORAL;  Surgeon: Cephus Shelling, MD;  Location: [facility] OR;  Service: Vascular;  Laterality: Left;   INTRAOPERATIVE ARTERIOGRAM Left 02/17/2022   Procedure: INTRA OPERATIVE ARTERIOGRAM;  Surgeon: Cephus Shelling, MD;  Location: [facility] OR;  Service: Vascular;  Laterality: Left;   KNEE ARTHROSCOPY     RIGHT   LEFT HEART CATH AND CORS/GRAFTS ANGIOGRAPHY N/A 04/19/2018   Procedure: LEFT HEART CATH AND CORS/GRAFTS ANGIOGRAPHY;  Surgeon: Runell Gess, MD;  [facility];  Service: Cardiovascular;  Laterality: N/A;   LUMBAR LAMINECTOMY/DECOMPRESSION MICRODISCECTOMY N/A 12/05/2015   Procedure: LUMBAR 2-3, LUMBAR 3-4 DECOMPRESSION ;  Surgeon: Estill Bamberg, MD;  Location: [facility] OR;  Service: Orthopedics;  Laterality: N/A;   PATCH ANGIOPLASTY Left 02/17/2022   Procedure: PATCH ANGIOPLASTY Left Common Femoral Artery.;  Surgeon: Cephus Shelling, MD;  Location: [facility] OR;  Service: Vascular;  Laterality: Left;   PERIPHERAL VASCULAR ATHERECTOMY  02/13/2022   Procedure: PERIPHERAL VASCULAR ATHERECTOMY;  Surgeon: Runell Gess, MD;  [facility]: [facility];  Service: Cardiovascular;;  Rt SFA   PERIPHERAL VASCULAR INTERVENTION Left 08/28/2022   Procedure: PERIPHERAL VASCULAR INTERVENTION;  Surgeon: Cephus Shelling, MD;  [facility];  Service: Cardiovascular;  Laterality: Left;  SFA/POP   Right long finger extensor tendon repair     THROMBECTOMY FEMORAL ARTERY Left 02/17/2022   Procedure: Repair of Left COMMON FEMORAL PSEUDOANEURYSM.;  Surgeon: Cephus Shelling, MD;  Location: [facility] OR;  Service: Vascular;  Laterality: Left;    Allergies  Allergies  Allergen Reactions   Statins Other (See Comments)    Bone and muscle pain   Crestor [Rosuvastatin]     myalgias   Lipitor [Atorvastatin]     myalgias   Oxycodone Nausea And Vomiting   Hydrocodone Nausea And Vomiting     Home Medications    Prior to Admission medications   Medication Sig Start Date End Date Taking? Authorizing Provider  acetaminophen (TYLENOL) 500 MG tablet Take 1,000 mg by mouth every 6 (six) hours as needed for mild pain.    [provider]  amLODipine (NORVASC) 10 MG tablet Take 1 tablet (10 mg total) by mouth daily. 07/17/23   Wendall Stade, MD  aspirin EC 81 MG tablet Take 1 tablet (81 mg total) by mouth daily. 03/26/17   Leone Brand, NP  cetirizine (ZYRTEC) 10 MG tablet Take 1 tablet (10 mg total) by mouth daily. 08/12/23   Ashok Croon, MD  clopidogrel (PLAVIX) 75 MG tablet Take 1 tablet (75 mg total) by mouth daily. 01/01/23   Sharlene Dory, PA-C  fluticasone (FLONASE) 50 MCG/ACT nasal spray Place 2 sprays into both nostrils daily. 08/12/23   Ashok Croon, MD  fluticasone (FLONASE) 50 MCG/ACT nasal spray Place into the nose. 08/12/23   [provider]  HYDROmorphone (DILAUDID) 2 MG tablet Take 1 tablet (2 mg total) by mouth every 4 (four) hours as  needed for severe pain. 08/25/22   Lars Mage, PA-C  losartan-hydrochlorothiazide (HYZAAR) 100-12.5 MG tablet Take 1 tablet by mouth daily. 01/01/23   Sharlene Dory, PA-C  methocarbamol (ROBAXIN) 500 MG tablet Take 500 mg by mouth every 8 (eight) hours as needed for muscle spasms.    [provider]  nitroGLYCERIN (NITROSTAT) 0.4 MG SL tablet Place 1 tablet (0.4 mg total) under the tongue every 5 (five) minutes as needed for chest pain. 01/01/23 08/19/23  Sharlene Dory, PA-C  pantoprazole (PROTONIX) 40 MG tablet Take 1 tablet (40 mg total) by mouth daily. 02/24/23   Wendall Stade, MD  Probiotic Product (PROBIOTIC-10 ULTIMATE) CAPS Take 1 capsule by mouth daily.    [provider]  REPATHA SURECLICK 140 MG/ML SOAJ INJECT ONE ML INTO THE SKIN EVERY 14 DAYS. 04/20/23   Wendall Stade, MD    Physical Exam    Vital Signs:  Carleene Overlie does not have vital signs available for review  today.  Given telephonic nature of communication, physical exam is limited. AAOx3. NAD. Normal affect.  Speech and respirations are unlabored.  Accessory Clinical Findings    None  Assessment & Plan    1.  Preoperative Cardiovascular Risk Assessment: LT Carpal Tunnel, LT palm mass excision     Date of Surgery:  [place] 12/07/23                              Surgeon:  Dr. Betha Loa Surgeon's Group or Practice Name:  [facility] [place] number:  [phone]      Primary Cardiologist: Charlton Haws, MD  Chart reviewed as part of pre-operative protocol coverage. Given past medical history and time since last visit, based on ACC/AHA guidelines, JAVYN HAVLIN would be at acceptable risk for the planned procedure without further cardiovascular testing.   His RCRI is low risk, 0.9% risk of major cardiac event.  He is able to complete greater than 4 METS of physical activity.  His Plavix may be held for 5 days prior to his surgery.  Please resume as soon as hemostasis is achieved.  Patient was advised that if he develops new symptoms prior to surgery to contact our office to arrange a follow-up appointment.  He verbalized understanding.    I will route this recommendation to the requesting party via Epic fax function and remove from pre-op pool.       Time:   Today, I have spent 7 minutes with the patient with telehealth technology discussing medical history, symptoms, and management plan.     Ronney Asters, NP  11/26/2023, 6:58 AM

## 2023-11-30 ENCOUNTER — Ambulatory Visit (INDEPENDENT_AMBULATORY_CARE_PROVIDER_SITE_OTHER): Payer: 59 | Admitting: Otolaryngology

## 2023-11-30 ENCOUNTER — Other Ambulatory Visit: Payer: Self-pay

## 2023-11-30 ENCOUNTER — Telehealth: Payer: Self-pay | Admitting: Cardiovascular Disease

## 2023-11-30 ENCOUNTER — Encounter (INDEPENDENT_AMBULATORY_CARE_PROVIDER_SITE_OTHER): Payer: Self-pay | Admitting: Otolaryngology

## 2023-11-30 ENCOUNTER — Encounter (HOSPITAL_BASED_OUTPATIENT_CLINIC_OR_DEPARTMENT_OTHER)
Admission: RE | Admit: 2023-11-30 | Discharge: 2023-11-30 | Disposition: A | Discharge status: Home / Self Care | Payer: 59 | Source: Ambulatory Visit | Attending: Orthopedic Surgery | Admitting: Orthopedic Surgery

## 2023-11-30 ENCOUNTER — Encounter (HOSPITAL_BASED_OUTPATIENT_CLINIC_OR_DEPARTMENT_OTHER): Payer: Self-pay | Admitting: Orthopedic Surgery

## 2023-11-30 VITALS — BP 148/80 | HR 88

## 2023-11-30 DIAGNOSIS — Z01812 Encounter for preprocedural laboratory examination: Secondary | ICD-10-CM | POA: Diagnosis present

## 2023-11-30 DIAGNOSIS — R0981 Nasal congestion: Secondary | ICD-10-CM | POA: Diagnosis not present

## 2023-11-30 DIAGNOSIS — J342 Deviated nasal septum: Secondary | ICD-10-CM

## 2023-11-30 DIAGNOSIS — H9190 Unspecified hearing loss, unspecified ear: Secondary | ICD-10-CM

## 2023-11-30 DIAGNOSIS — D119 Benign neoplasm of major salivary gland, unspecified: Secondary | ICD-10-CM | POA: Diagnosis not present

## 2023-11-30 DIAGNOSIS — Z720 Tobacco use: Secondary | ICD-10-CM

## 2023-11-30 DIAGNOSIS — J3489 Other specified disorders of nose and nasal sinuses: Secondary | ICD-10-CM

## 2023-11-30 DIAGNOSIS — J343 Hypertrophy of nasal turbinates: Secondary | ICD-10-CM

## 2023-11-30 DIAGNOSIS — K118 Other diseases of salivary glands: Secondary | ICD-10-CM

## 2023-11-30 DIAGNOSIS — G4733 Obstructive sleep apnea (adult) (pediatric): Secondary | ICD-10-CM

## 2023-11-30 LAB — BASIC METABOLIC PANEL WITH GFR
Anion gap: 14 (ref 5–15)
BUN: 24 mg/dL — ABNORMAL HIGH (ref 8–23)
CO2: 19 mmol/L — ABNORMAL LOW (ref 22–32)
Calcium: 9.2 mg/dL (ref 8.9–10.3)
Chloride: 101 mmol/L (ref 98–111)
Creatinine, Ser: 1.87 mg/dL — ABNORMAL HIGH (ref 0.61–1.24)
GFR, Estimated: 38 mL/min — ABNORMAL LOW
Glucose, Bld: 128 mg/dL — ABNORMAL HIGH (ref 70–99)
Potassium: 4.2 mmol/L (ref 3.5–5.1)
Sodium: 134 mmol/L — ABNORMAL LOW (ref 135–145)

## 2023-11-30 NOTE — Telephone Encounter (Signed)
     Primary Cardiologist: Charlton Haws, MD  Chart reviewed as part of pre-operative protocol coverage. Given past medical history and time since last visit, based on ACC/AHA guidelines, JOSEALBERTO MONTALTO would be at acceptable risk for the planned procedure without further cardiovascular testing.   His aspirin and Plavix may be held for 5 to 7 days prior to his procedure.  Please resume as soon as hemostasis is achieved.  I will route this recommendation to the requesting party via Epic fax function and remove from pre-op pool.  Please call with questions.  Thomasene Ripple. Jeanenne Licea NP-C     11/30/2023, 4:19 PM [facility] [address] Office [phone] Fax [phone]

## 2023-11-30 NOTE — Progress Notes (Unsigned)
ENT Progress Note:  Update 11/30/23 Discussed the use of AI scribe software for clinical note transcription with the patient, who gave verbal consent to proceed.  History of Present Illness   The patient is a 68 year old with hx of nasal congestion and a parotid tumor who presents for follow-up on nasal congestion and parotid tumor management.  Nasal congestion has been persistent, but there is improvement with the use of a nasal spray. They are not currently taking Zyrtec.  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 for surgical consultation. He has f/u scheduled there 04/2024 with the plan to re-image prior to surgery.   He has hx of hearing loss and tinnitus, but at this time would like to hold off on hearing evaluation.   They use a CPAP machine for obstructive sleep apnea and have found a smaller mask that works well.   Records reviewed Note by Dr Lou Cal 10/30/23 His history was obtained from Dr. Meriel Flavors in the ENT clinic at [facility] from 09/28/2023. He was noted to have a history of cardiovascular disease and recent carotid endarterectomy in November 2024 on the right. Imaging was completed for follow-up of the vascular surgery and a mass was demonstrated in the left parotid gland. He was also noted to have multiple cardiac stents and a CABG and is on aspirin and Plavix, OSA and uses CPAP, and left common femoral pseudoaneurysm treated with an endarterectomy in 2023.  The patient reports he is asymptomatic of the parotid lesion. History of facial weakness. He does have a history of skin cancers of his legs and arms treated with local excision. He also has a history of precancer scalp lesions treated with cryoablation's. He has a significant history of smoking, and smokes at least 1 pack a day for a for most of his life.   1. Left parotid gland lesion. The lesion is identified as a Warthin's tumor, typically  associated with smoking and generally benign. The growth of such tumors can be influenced by smoking habits, and it is possible to develop multiple tumors. The primary risk of not excising the tumor is potential enlargement over time. Given his significant medical history of vascular disease in the neck and leg, necessitating anticoagulation therapy, a conservative approach is recommended if the tumor is not causing discomfort. A neck ultrasound will be scheduled in 6 months to monitor the size of the tumor. A request will be made to obtain the pathology slides from the core needle biopsy of the left parotid mass performed on 08/27/2023, for further examination. If the tumor remains asymptomatic, the next course of action will be determined based on the ultrasound results. If the tumor becomes symptomatic, surgical removal may be considered.  2. Cardiovascular disease. He has a history of multiple cardiac stents, CABG, and is currently on aspirin and Plavix. He also has a history of a left common femoral pseudoaneurysm treated with an endarterectomy in 2023. He underwent a right carotid endarterectomy in 08/2023. No further procedures on the neck blood vessels are planned at this time. He is scheduled for a follow-up in 9 months (06/2024) for a checkup.  3. Smoking. He smokes a pack a day and has been smoking for approximately 50 years. Smoking cessation is strongly advised due to its impact on his overall health and the potential growth of the Warthin's tumor.  PROCEDURE The patient underwent a right carotid endarterectomy on 09/04/2023.  He also had an endarterectomy for a left common femoral pseudoaneurysm in 2023. Core needle biopsy of the left parotid mass was performed on 08/27/2023.    Update 09/28/23  Discussed the use of AI scribe software for clinical note transcription with the patient, who gave verbal consent to proceed.  History of Present Illness   The patient, with a history of  cardiovascular disease and recent carotid endarterectomy 09/04/23 on the right side, presents for follow-up regarding an incidental finding of a mass in the left parotid gland. 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 The patient reports no changes in facial movement or sensation.  In addition to the parotid mass, the patient has been experiencing nasal congestion, which has improved with the use of Flonase. However, he reports occasional epistaxis, which he attributes to the dry weather and use of Flonase.  The patient also mentions a history of chronic ear infections, with a healed perforation left side. 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  The patient also reports a recent work-related back injury, for which he is awaiting a back MRI. He expresses frustration with the delay in scheduling due to workers' compensation.  Lastly, the patient mentions a history of stent placements in the heart and leg, with no other implants. He is currently on Plavix, which was temporarily held for a recent carotid endarterectomy procedure on the right side.      Initial Evaluation 08/12/23  Reason for Consult: left parotid mass   HPI: [name] is an 68 y.o. male with hx of CAD, s/p multiple cardiac stents and CABG, on ASA/Plavix, current smoker, left carotid artery occlusion, scheduled for endarterectomy 09/04/23, hx of OSA on CPAP, here for incidentally noted left parotid mass and chronic nasal congestion/nasal obstruction.   On ASA and Plavix will be on it indefinitely.   Records Reviewed:  Vascular surgery office note 08/03/2023 Dr Myra Gianotti  02/17/2022: Repair of left common femoral pseudoaneurysm, left leg angiography, left femoral endarterectomy with  bovine patch angioplasty (Dr. Chestine Spore) 08/28/2022: Left SFA and popliteal stent (rest pain, Dr. Chestine Spore) 02/13/2022: Atherectomy, DCB (Dr. Allyson Sabal, claudication)   He recently had a ultrasound that showed his left leg stents to be occluded this appears to be a silent occlusion and that he is able to tolerate his symptoms.   The patient has a known history of a chronic left carotid occlusion, however he was unaware of this.[name]  He recently had a duplex that showed stenosis greater than 80% on the right.  He is sent for further evaluation.  He denies any neurologic symptoms such as numbness or weakness in either extremity, slurred speech, or amaurosis fugax.  I sent him for a CT scan to better evaluate this.  He is back today for discussions   Patient has a history of coronary artery disease.  He has undergone multiple PCI procedures.  She is a current smoker.  She is on Repatha for hypercholesterolemia.  She is medically managed for hypertension.   Past Medical History:  Diagnosis Date   Cervical spine ankylosis    CKD (chronic kidney disease) stage 3, GFR 30-59 ml/min (HCC)    COLONIC POLYPS, HX OF    CORONARY ARTERY DISEASE    a. s/p stent to RCA 2005. b. Stent  to Cx 2007. c.  CABG X4 2014; d. LHC 04/19/18 occluded SVG-Circ, DES to in-stent restenosis of circ.    COVID-19 2020   Diverticulosis    DIVERTICULOSIS, COLON    DYSPHAGIA UNSPECIFIED    intermittent   GERD    HEMORRHOIDS    HYPERLIPIDEMIA    HYPERTENSION    HYPERTRIGLYCERIDEMIA    Left carotid artery occlusion    OBESITY    Postoperative atrial fibrillation (HCC) 2014   Pseudoaneurysm of left femoral artery (HCC) 02/16/2022   SLEEP APNEA    on CPAP   Statin intolerance    Tobacco abuse    Vertebral artery occlusion, left     Past Surgical History:  Procedure Laterality Date   ABDOMINAL AORTOGRAM W/LOWER EXTREMITY N/A 02/13/2022   Procedure: ABDOMINAL AORTOGRAM W/LOWER EXTREMITY;  Surgeon: Runell Gess, MD;  Location: [facility]  INVASIVE CV LAB;  Service: Cardiovascular;  Laterality: N/A;   ABDOMINAL AORTOGRAM W/LOWER EXTREMITY N/A 08/28/2022   Procedure: ABDOMINAL AORTOGRAM W/LOWER EXTREMITY;  Surgeon: Cephus Shelling, MD;  Location: [facility] INVASIVE CV LAB;  Service: Cardiovascular;  Laterality: N/A;   ACHILLES TENDON REPAIR     BACK SURGERY     lower back   CARDIAC CATHETERIZATION     CATARACT EXTRACTION Bilateral    CORONARY ARTERY BYPASS GRAFT  08/18/2012   Procedure: CORONARY ARTERY BYPASS GRAFTING (CABG);  Surgeon: Alleen Borne, MD;  Location: [facility] OR;  Service: Open Heart Surgery;  Laterality: N/A;  CABG x four;  using left internal mammary artery and right leg greater saphenous vein harvested endoscopically   CORONARY STENT INTERVENTION N/A 04/19/2018   Procedure: CORONARY STENT INTERVENTION;  Surgeon: Runell Gess, MD;  Location: [facility] INVASIVE CV LAB;  Service: Cardiovascular;  Laterality: N/A;   ENDARTERECTOMY Right 09/04/2023   Procedure: ENDARTERECTOMY CAROTID;  Surgeon: Nada Libman, MD;  [facility] OR;  Service: Vascular;  Laterality: Right;   ENDARTERECTOMY FEMORAL Left 02/17/2022   Procedure: ENDARTERECTOMY FEMORAL;  Surgeon: Cephus Shelling, MD;  Location: [facility] OR;  Service: Vascular;  Laterality: Left;   INTRAOPERATIVE ARTERIOGRAM Left 02/17/2022   Procedure: INTRA OPERATIVE ARTERIOGRAM;  Surgeon: Cephus Shelling, MD;  Location: [facility] OR;  Service: Vascular;  Laterality: Left;   KNEE ARTHROSCOPY     RIGHT   LEFT HEART CATH AND CORS/GRAFTS ANGIOGRAPHY N/A 04/19/2018   Procedure: LEFT HEART CATH AND CORS/GRAFTS ANGIOGRAPHY;  Surgeon: Runell Gess, MD;  Location: [facility] INVASIVE CV LAB;  Service: Cardiovascular;  Laterality: N/A;   LUMBAR LAMINECTOMY/DECOMPRESSION MICRODISCECTOMY N/A 12/05/2015   Procedure: LUMBAR 2-3, LUMBAR 3-4 DECOMPRESSION ;  Surgeon: Estill Bamberg, MD;  Location: [facility] OR;  Service: Orthopedics;  Laterality: N/A;   PATCH ANGIOPLASTY Left 02/17/2022   Procedure: PATCH  ANGIOPLASTY Left Common Femoral Artery.;  Surgeon: Cephus Shelling, MD;  Location: [facility] OR;  Service: Vascular;  Laterality: Left;   PERIPHERAL VASCULAR ATHERECTOMY  02/13/2022   Procedure: PERIPHERAL VASCULAR ATHERECTOMY;  Surgeon: Runell Gess, MD;  Location: [facility] INVASIVE CV LAB;  Service: Cardiovascular;;  Rt SFA   PERIPHERAL VASCULAR INTERVENTION Left 08/28/2022   Procedure: PERIPHERAL VASCULAR INTERVENTION;  Surgeon: Cephus Shelling, MD;  Location: [facility] INVASIVE CV LAB;  Service: Cardiovascular;  Laterality: Left;  SFA/POP   Right long finger extensor tendon repair     THROMBECTOMY FEMORAL ARTERY Left 02/17/2022   Procedure: Repair of Left COMMON FEMORAL PSEUDOANEURYSM.;  Surgeon: Cephus Shelling, MD;  Location: [facility] OR;  Service: Vascular;  Laterality: Left;  Family History  Problem Relation Age of Onset   Cancer Mother    Heart disease Brother     Social History:  reports that he has been smoking cigarettes. He has a 43 pack-year smoking history. He has been exposed to tobacco smoke. He has never used smokeless tobacco. He reports that he does not currently use alcohol. He reports that he does not use drugs.  Allergies:  Allergies  Allergen Reactions   Statins Other (See Comments)    Bone and muscle pain   Crestor [Rosuvastatin]     myalgias   Lipitor [Atorvastatin]     myalgias   Oxycodone Nausea And Vomiting   Hydrocodone Nausea And Vomiting    Medications: I have reviewed the patient's current medications.  The PMH, PSH, Medications, Allergies, and SH were reviewed and updated.  ROS: Constitutional: Negative for fever, weight loss and weight gain. Cardiovascular: Negative for chest pain and dyspnea on exertion. Respiratory: Is not experiencing shortness of breath at rest. Gastrointestinal: Negative for nausea and vomiting. Neurological: Negative for headaches. Psychiatric: The patient is not nervous/anxious  Blood pressure (!) 148/80, pulse 88, SpO2  99%.  PHYSICAL EXAM:  Exam: General: Well-developed, well-nourished Respiratory Respiratory effort: Equal inspiration and expiration without stridor Cardiovascular Peripheral Vascular: Warm extremities with equal color/perfusion Eyes: No nystagmus with equal extraocular motion bilaterally Neuro/Psych/Balance: Patient oriented to person, place, and time; Appropriate mood and affect; Gait is intact with no imbalance; Cranial nerves I-XII are intact Head and Face Inspection: Normocephalic and atraumatic without mass or lesion Palpation: Facial skeleton intact without bony stepoffs Salivary Glands: No mass or tenderness Facial Strength: Facial motility symmetric and full bilaterally ENT Pinna: External ear intact and fully developed External canal: Canal is patent with intact skin Tympanic Membrane: Clear and mobile evidence of healed perforation on the left side External Nose: No scar or anatomic deformity Lips, Teeth, and gums: Mucosa and teeth intact and viable TMJ: No pain to palpation with full mobility Oral cavity/oropharynx: No erythema or exudate, no lesions present Neck Neck and Trachea: Midline trachea without mass or lesion Thyroid: No mass or nodularity Lymphatics: No lymphadenopathy  Studies Reviewed: CT Angio  IMPRESSION: 1. 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 A radiographic string sign is present. Minimal attenuated intermittent flow distally with occlusion by the skull base. Distal reconstitution at the left carotid siphon with additional severe stenosis involving the paraclinoid left ICA. 2. 80% atheromatous stenosis about the right carotid bulb. 3. Severe 70% stenosis involving the proximal right V4 segment just distal to the dural reflection. The vertebral arteries are otherwise widely patent within the neck. 4. 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 ENT referral for further workup and evaluation suggested. 5. Few scattered pulmonary nodules measuring up to 6 mm within the visualized upper lungs. The patient has recently undergone a lung cancer screening chest CT on 07/28/2023. Please refer to this exam for follow-up recommendations regarding these findings.  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   Warthin's tumor Yes   Mass of left parotid gland    Nasal septal deviation    Chronic nasal congestion    Hypertrophy of both inferior nasal turbinates    Obstructive sleep apnea    Tobacco abuse    Nasal obstruction       Incidentally noted left parotid mass on CT angio neck  done on 07/29/2023, I personally reviewed the scan and the lesion appears to be solid located in the mid superficial parotid lobe.  I was not able to feel the lesion on exam today which is likely due to the fact that it is deeper in the superficial lobe of the parotid. - FNA of the left parotid mas -Will likely obtain MRI to better evaluate the size and characteristics of the parotid tumor if surgery is planned in the future -We discussed imaging findings and prognosis for carotid tumors today, we also discussed management options - He will return after biopsy -on aspirin and Plavix, will need clearance due to multiple comorbidities if parotid surgery is planned in the future  2.  Chronic nasal congestion and nasal obstruction.  History of OSA and CPAP use.  History of smoking for many years.  Nasal endoscopy today with evidence of septal deviation and significant narrowing of bilateral nasal passages as well as inferior turbinate hypertrophy and significant mucosal edema -I discussed exam findings with the patient -I discussed management options for chronic nasal congestion -Start nasal saline rinses -Start Flonase 2 puffs bilateral nares twice daily -Start daily Zyrtec 10 mg  3.   Septal deviation and inferior turban hypertrophy with symptoms of chronic nasal obstruction, worse on the left -Will consider septo/ITR if he fails medical management above  4.  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 Biopsy indicates benign nature, but excision is recommended for definitive diagnosis and ease of removal while mass is small. Major risk of surgery includes facial nerve weakness due to proximity of the facial nerve to the tumor. Nerve monitoring and dissection will be performed during surgery to mitigate this risk. Patient prefers to delay surgery until after his trip in March.   - Order dedicated imaging for the mass  MRI neck with gad - Schedule excision post-imaging   - Hold Plavix prior to surgery  - will need clearance 2/2 medical comorbitidies and hx of smoking and Plavix instructions prior to surgery - Discuss surgery details and risks with patient post-imaging    Chronic Nasal Congestion  - nasal endoscopy done in the past:  Septum is deviated to the left and with a large caudal septal spur, significant narrowing of bilateral nasal passages left> right. No polyps, no purulence. Mucosal edema and erythema present. Bilateral inferior turbinate hypertrophy.  - Improved with Flonase. Occasional epistaxis likely due to dry weather and improper Flonase application.   - Continue Flonase  2 puffs b/l nares BID and Zyrtec 10 mg daily - Instruct on proper Flonase application (aim up and lateral)   - Add nasal saline spray   - Apply small dab of Vaseline in each nostril twice a day for nasal dryness and to prevent epistaxis - consider smoking cessation  Chronic tinnitus in  the setting of noise exposure, deferred Audiogram today   Tobacco use d/o - we discussed importance of quitting, and I offered smoking cessation referral but he would like to hold off at this time. Total of 3 min spent on counseling.   Follow-up   - Schedule follow-up visit before March trip   - Ensure preauthorization for imaging   -  Advise patient to contact imaging center if no response in a few days.      Update 11/30/23  Assessment and Plan    Chronic Nasal Congestion Significant improvement with Flonase  - Continue Flonase  Parotid Tumor (Warthin's Tumor) L side Diagnosed with left sided 2.2 cm Warthin's tumor. Plan to monitor with interval scan in a few months. Surgery planned for summer if necessary. Discussed ease of removal while small and potential for growth. - f/u at [facility] in July 2025 as scheduled  Hearing Loss Long-standing hearing loss and tinnitus likely due to prolonged noise exposure without ear protection. Declines further hearing tests. - No further action required at this time  Obstructive Sleep Apnea Uses CPAP with small nasal mask, reports good tolerance. Discussed Inspire therapy but patient prefers current CPAP setup. - Continue current CPAP therapy  Smoking Cessation Current smoker attempting to quit using nicotine patches. Encouraged to continue efforts. - Continue using nicotine patches to aid in smoking cessation  General Health Maintenance Generally in good health with multiple ongoing medical issues managed by specialists. - Follow up with specialists as scheduled - Return to clinic as needed for any new ENT-related issues  I spent 30 minutes in total face-to-face time and in reviewing records during pre-charting, more than 50% of which was spent in counseling and coordination of care, reviewing test results, reviewing medications and treatment regimen and/or in discussing or reviewing the diagnosis, the prognosis and treatment options.  Pertinent laboratory and imaging test results that were available during this visit with the patient were reviewed by me and considered in my medical decision making (see chart for details).    Ashok Croon, MD Otolaryngology [facility] Phone: [phone] Fax: [phone]    12/01/2023, 5:38 AM

## 2023-11-30 NOTE — Progress Notes (Signed)
Vinson Moselle at Dr. Merlyn Lot to confirm hold time, ASA. Have clearance from cards and hold time for plavix, just need clarification for asa. Chart reviewed with Dr. Richardson Landry, ok to proceed.

## 2023-11-30 NOTE — Telephone Encounter (Signed)
   Pre-operative Risk Assessment    Patient Name: [name]  DOB: May 01, 1956 MRN: [id number]   Date of last office visit: 07/17/2023 Date of next office visit: 12/30/2023   Request for Surgical Clearance    Procedure:   epidural injection  Date of Surgery:  Clearance TBD                                Surgeon:  Dr. Claria Dice Surgeon's Group or Practice Name:  [facility] Phone number:  [phone] Fax number:  [phone]  Michaele Offer   Type of Clearance Requested:   - Medical  - Pharmacy:  Hold Aspirin and Clopidogrel (Plavix) When to stop prior to procedure.   Type of Anesthesia:  None    Additional requests/questions:    Signed, Royann Shivers   11/30/2023, 3:54 PM

## 2023-12-02 ENCOUNTER — Other Ambulatory Visit: Payer: Self-pay | Admitting: Orthopedic Surgery

## 2023-12-07 ENCOUNTER — Ambulatory Visit (HOSPITAL_BASED_OUTPATIENT_CLINIC_OR_DEPARTMENT_OTHER): Payer: 59 | Admitting: Anesthesiology

## 2023-12-07 ENCOUNTER — Encounter (HOSPITAL_BASED_OUTPATIENT_CLINIC_OR_DEPARTMENT_OTHER)
Admission: RE | Disposition: A | Discharge status: Home / Self Care | Payer: Self-pay | Source: Home / Self Care | Attending: Orthopedic Surgery

## 2023-12-07 ENCOUNTER — Ambulatory Visit (HOSPITAL_BASED_OUTPATIENT_CLINIC_OR_DEPARTMENT_OTHER)
Admission: RE | Admit: 2023-12-07 | Discharge: 2023-12-07 | Disposition: A | Discharge status: Home / Self Care | Payer: 59 | Attending: Orthopedic Surgery | Admitting: Orthopedic Surgery

## 2023-12-07 ENCOUNTER — Encounter (HOSPITAL_BASED_OUTPATIENT_CLINIC_OR_DEPARTMENT_OTHER): Payer: Self-pay | Admitting: Orthopedic Surgery

## 2023-12-07 ENCOUNTER — Other Ambulatory Visit: Payer: Self-pay

## 2023-12-07 DIAGNOSIS — G5602 Carpal tunnel syndrome, left upper limb: Secondary | ICD-10-CM | POA: Diagnosis present

## 2023-12-07 DIAGNOSIS — N183 Chronic kidney disease, stage 3 unspecified: Secondary | ICD-10-CM | POA: Diagnosis not present

## 2023-12-07 DIAGNOSIS — I25110 Atherosclerotic heart disease of native coronary artery with unstable angina pectoris: Secondary | ICD-10-CM | POA: Diagnosis not present

## 2023-12-07 DIAGNOSIS — I739 Peripheral vascular disease, unspecified: Secondary | ICD-10-CM | POA: Diagnosis not present

## 2023-12-07 DIAGNOSIS — I2510 Atherosclerotic heart disease of native coronary artery without angina pectoris: Secondary | ICD-10-CM | POA: Diagnosis not present

## 2023-12-07 DIAGNOSIS — G4730 Sleep apnea, unspecified: Secondary | ICD-10-CM | POA: Diagnosis not present

## 2023-12-07 DIAGNOSIS — I129 Hypertensive chronic kidney disease with stage 1 through stage 4 chronic kidney disease, or unspecified chronic kidney disease: Secondary | ICD-10-CM | POA: Diagnosis not present

## 2023-12-07 DIAGNOSIS — R2232 Localized swelling, mass and lump, left upper limb: Secondary | ICD-10-CM

## 2023-12-07 DIAGNOSIS — K219 Gastro-esophageal reflux disease without esophagitis: Secondary | ICD-10-CM | POA: Insufficient documentation

## 2023-12-07 DIAGNOSIS — F17210 Nicotine dependence, cigarettes, uncomplicated: Secondary | ICD-10-CM | POA: Diagnosis not present

## 2023-12-07 DIAGNOSIS — Z955 Presence of coronary angioplasty implant and graft: Secondary | ICD-10-CM | POA: Diagnosis not present

## 2023-12-07 DIAGNOSIS — L720 Epidermal cyst: Secondary | ICD-10-CM | POA: Insufficient documentation

## 2023-12-07 DIAGNOSIS — Z01818 Encounter for other preprocedural examination: Secondary | ICD-10-CM

## 2023-12-07 HISTORY — PX: CARPAL TUNNEL RELEASE: SHX101

## 2023-12-07 HISTORY — PX: MASS EXCISION: SHX2000

## 2023-12-07 SURGERY — CARPAL TUNNEL RELEASE
Anesthesia: General | Site: Hand | Laterality: Left

## 2023-12-07 MED ORDER — PROPOFOL 10 MG/ML IV BOLUS
INTRAVENOUS | Status: DC | PRN
  Administered 2023-12-07: 150 mg via INTRAVENOUS

## 2023-12-07 MED ORDER — MIDAZOLAM HCL 2 MG/2ML IJ SOLN
INTRAMUSCULAR | Status: AC
  Filled 2023-12-07: qty 2

## 2023-12-07 MED ORDER — FENTANYL CITRATE (PF) 100 MCG/2ML IJ SOLN
INTRAMUSCULAR | Status: AC
  Filled 2023-12-07: qty 2

## 2023-12-07 MED ORDER — CEFAZOLIN SODIUM-DEXTROSE 2-4 GM/100ML-% IV SOLN
INTRAVENOUS | Status: AC
  Filled 2023-12-07: qty 100

## 2023-12-07 MED ORDER — ONDANSETRON HCL 4 MG/2ML IJ SOLN
INTRAMUSCULAR | Status: AC
Start: 2023-12-07 — End: ?
  Filled 2023-12-07: qty 2

## 2023-12-07 MED ORDER — ONDANSETRON HCL 4 MG PO TABS: 4.0000 mg | ORAL_TABLET | Freq: Three times a day (TID) | ORAL | 0 refills | Status: DC | PRN

## 2023-12-07 MED ORDER — FENTANYL CITRATE (PF) 100 MCG/2ML IJ SOLN
INTRAMUSCULAR | Status: DC | PRN
  Administered 2023-12-07: 100 ug via INTRAVENOUS

## 2023-12-07 MED ORDER — ONDANSETRON HCL 4 MG/2ML IJ SOLN
INTRAMUSCULAR | Status: DC | PRN
  Administered 2023-12-07: 4 mg via INTRAVENOUS

## 2023-12-07 MED ORDER — LACTATED RINGERS IV SOLN: INTRAVENOUS | Status: DC | PRN

## 2023-12-07 MED ORDER — BUPIVACAINE HCL (PF) 0.25 % IJ SOLN
INTRAMUSCULAR | Status: AC
  Filled 2023-12-07: qty 30

## 2023-12-07 MED ORDER — PHENYLEPHRINE HCL-NACL 20-0.9 MG/250ML-% IV SOLN
INTRAVENOUS | Status: DC | PRN
  Administered 2023-12-07: 50 ug/min via INTRAVENOUS

## 2023-12-07 MED ORDER — LIDOCAINE HCL (CARDIAC) PF 100 MG/5ML IV SOSY
PREFILLED_SYRINGE | INTRAVENOUS | Status: DC | PRN
  Administered 2023-12-07: 80 mg via INTRAVENOUS

## 2023-12-07 MED ORDER — LIDOCAINE 2% (20 MG/ML) 5 ML SYRINGE
INTRAMUSCULAR | Status: AC
  Filled 2023-12-07: qty 5

## 2023-12-07 MED ORDER — 0.9 % SODIUM CHLORIDE (POUR BTL) OPTIME
TOPICAL | Status: DC | PRN
  Administered 2023-12-07: 500 mL

## 2023-12-07 MED ORDER — ACETAMINOPHEN 10 MG/ML IV SOLN: 1000.0000 mg | Freq: Once | INTRAVENOUS | Status: DC | PRN

## 2023-12-07 MED ORDER — CEFAZOLIN SODIUM-DEXTROSE 2-4 GM/100ML-% IV SOLN
2.0000 g | INTRAVENOUS | Status: AC
  Administered 2023-12-07: 2 g via INTRAVENOUS

## 2023-12-07 MED ORDER — MIDAZOLAM HCL 2 MG/2ML IJ SOLN
INTRAMUSCULAR | Status: AC
Start: 2023-12-07 — End: ?
  Filled 2023-12-07: qty 2

## 2023-12-07 MED ORDER — ACETAMINOPHEN 500 MG PO TABS: 1000.0000 mg | ORAL_TABLET | Freq: Once | ORAL | Status: DC | PRN

## 2023-12-07 MED ORDER — ACETAMINOPHEN 160 MG/5ML PO SOLN: 1000.0000 mg | Freq: Once | ORAL | Status: DC | PRN

## 2023-12-07 MED ORDER — BUPIVACAINE HCL (PF) 0.25 % IJ SOLN
INTRAMUSCULAR | Status: DC | PRN
  Administered 2023-12-07: 9 mL

## 2023-12-07 MED ORDER — LACTATED RINGERS IV SOLN
INTRAVENOUS | Status: DC
Start: 2023-12-07 — End: 2023-12-07

## 2023-12-07 MED ORDER — FENTANYL CITRATE (PF) 100 MCG/2ML IJ SOLN
25.0000 ug | INTRAMUSCULAR | Status: DC | PRN
  Administered 2023-12-07: 50 ug via INTRAVENOUS

## 2023-12-07 MED ORDER — PROPOFOL 10 MG/ML IV BOLUS
INTRAVENOUS | Status: AC
  Filled 2023-12-07: qty 20

## 2023-12-07 MED ORDER — MIDAZOLAM HCL 2 MG/2ML IJ SOLN
INTRAMUSCULAR | Status: DC | PRN
  Administered 2023-12-07: 2 mg via INTRAVENOUS

## 2023-12-07 MED ORDER — TRAMADOL HCL 50 MG PO TABS: ORAL_TABLET | ORAL | 0 refills | Status: DC

## 2023-12-07 MED ORDER — PROPOFOL 10 MG/ML IV BOLUS
INTRAVENOUS | Status: AC
Start: 2023-12-07 — End: ?
  Filled 2023-12-07: qty 20

## 2023-12-07 MED ORDER — PHENYLEPHRINE HCL (PRESSORS) 10 MG/ML IV SOLN
INTRAVENOUS | Status: DC | PRN
  Administered 2023-12-07: 240 ug via INTRAVENOUS
  Administered 2023-12-07: 80 ug via INTRAVENOUS

## 2023-12-07 SURGICAL SUPPLY — 44 items
BANDAGE GAUZE 1X75IN STRL (MISCELLANEOUS) IMPLANT
BENZOIN TINCTURE PRP APPL 2/3 (GAUZE/BANDAGES/DRESSINGS) IMPLANT
BLADE MINI RND TIP GREEN BEAV (BLADE) IMPLANT
BLADE SURG 15 STRL LF DISP TIS (BLADE) ×2 IMPLANT
BNDG COHESIVE 1X5 TAN STRL LF (GAUZE/BANDAGES/DRESSINGS) IMPLANT
BNDG COHESIVE 2X5 TAN ST LF (GAUZE/BANDAGES/DRESSINGS) IMPLANT
BNDG ELASTIC 2INX 5YD STR LF (GAUZE/BANDAGES/DRESSINGS) IMPLANT
BNDG ELASTIC 3INX 5YD STR LF (GAUZE/BANDAGES/DRESSINGS) ×1 IMPLANT
BNDG ESMARK 4X9 LF (GAUZE/BANDAGES/DRESSINGS) IMPLANT
BNDG GAUZE 1X75IN STRL (MISCELLANEOUS)
BNDG GAUZE DERMACEA FLUFF 4 (GAUZE/BANDAGES/DRESSINGS) ×1 IMPLANT
BNDG PLASTER X FAST 3X3 WHT LF (CAST SUPPLIES) IMPLANT
CHLORAPREP W/TINT 26 (MISCELLANEOUS) ×1 IMPLANT
CORD BIPOLAR FORCEPS 12FT (ELECTRODE) ×1 IMPLANT
COVER BACK TABLE 60X90IN (DRAPES) ×1 IMPLANT
COVER MAYO STAND STRL (DRAPES) ×1 IMPLANT
CUFF TOURN SGL QUICK 18X4 (TOURNIQUET CUFF) ×1 IMPLANT
DRAPE EXTREMITY T 121X128X90 (DISPOSABLE) ×1 IMPLANT
DRAPE SURG 17X23 STRL (DRAPES) ×1 IMPLANT
GAUZE PAD ABD 8X10 STRL (GAUZE/BANDAGES/DRESSINGS) ×1 IMPLANT
GAUZE SPONGE 4X4 12PLY STRL (GAUZE/BANDAGES/DRESSINGS) ×1 IMPLANT
GAUZE STRETCH 2X75IN STRL (MISCELLANEOUS) IMPLANT
GAUZE XEROFORM 1X8 LF (GAUZE/BANDAGES/DRESSINGS) ×1 IMPLANT
GLOVE BIO SURGEON STRL SZ7.5 (GLOVE) ×1 IMPLANT
GLOVE BIOGEL PI IND STRL 8 (GLOVE) ×1 IMPLANT
GOWN STRL REUS W/ TWL LRG LVL3 (GOWN DISPOSABLE) ×1 IMPLANT
GOWN STRL REUS W/TWL XL LVL3 (GOWN DISPOSABLE) ×1 IMPLANT
NDL HYPO 25X1 1.5 SAFETY (NEEDLE) ×1 IMPLANT
NEEDLE HYPO 25X1 1.5 SAFETY (NEEDLE) ×1
NS IRRIG 1000ML POUR BTL (IV SOLUTION) ×1 IMPLANT
PACK BASIN DAY SURGERY FS (CUSTOM PROCEDURE TRAY) ×1 IMPLANT
PAD CAST 3X4 CTTN HI CHSV (CAST SUPPLIES) IMPLANT
PAD CAST 4YDX4 CTTN HI CHSV (CAST SUPPLIES) IMPLANT
PADDING CAST ABS COTTON 4X4 ST (CAST SUPPLIES) ×1 IMPLANT
STOCKINETTE 4X48 STRL (DRAPES) ×1 IMPLANT
STRIP CLOSURE SKIN 1/2X4 (GAUZE/BANDAGES/DRESSINGS) IMPLANT
SUT ETHILON 3 0 PS 1 (SUTURE) IMPLANT
SUT ETHILON 4 0 PS 2 18 (SUTURE) ×1 IMPLANT
SUT NYLON ETHILON 5-0 P-3 1X18 (SUTURE) IMPLANT
SUT VIC AB 4-0 P2 18 (SUTURE) IMPLANT
SYR BULB EAR ULCER 3OZ GRN STR (SYRINGE) ×1 IMPLANT
SYR CONTROL 10ML LL (SYRINGE) ×1 IMPLANT
TOWEL GREEN STERILE FF (TOWEL DISPOSABLE) ×2 IMPLANT
UNDERPAD 30X36 HEAVY ABSORB (UNDERPADS AND DIAPERS) ×1 IMPLANT

## 2023-12-07 NOTE — Op Note (Signed)
 12/07/2023 [facility]                              OPERATIVE REPORT   PREOPERATIVE DIAGNOSIS: 1.  Left carpal tunnel syndrome 2.  Left palm mass  POSTOPERATIVE DIAGNOSIS:   1.  Left carpal tunnel syndrome 2.  Left palm mass, inclusion cyst  PROCEDURE:   1.  Left carpal tunnel release 2.  Excision left palm mass, subcutaneous, 28 mm  SURGEON:  Brunilda Capra, MD  ASSISTANT: Left palm mass to pathology.  ANESTHESIA: General  IV FLUIDS:  Per anesthesia flow sheet.  ESTIMATED BLOOD LOSS:  Minimal.  COMPLICATIONS:  None.  SPECIMENS:  None.  TOURNIQUET TIME:    Total Tourniquet Time Documented: Upper Arm (Left) - 31 minutes Total: Upper Arm (Left) - 31 minutes   DISPOSITION:  Stable to PACU.  LOCATION: [facility]  INDICATIONS:  68 y.o. yo male with numbness and tingling left hand.  Nocturnal symptoms. Positive nerve conduction studies. He wishes to proceed with left carpal tunnel release.  He also has a mass in his left palm that he wishes to have excised.  Risks, benefits and alternatives of surgery were discussed including the risk of blood loss; infection; damage to nerves, vessels, tendons, ligaments, bone; failure of surgery; need for additional surgery; complications with wound healing; continued pain; recurrence of carpal tunnel syndrome; and damage to motor branch. He voiced understanding of these risks and elected to proceed.   OPERATIVE COURSE:  After being identified preoperatively by myself, the patient and I agreed upon the procedure and site of procedure.  The surgical site was marked.  Surgical consent had been signed.  He was given IV Ancef  as preoperative antibiotic prophylaxis.  He was transferred to the operating room and placed on the operating room table in supine position with the left upper extremity on an armboard.  General anesthesia was induced by the anesthesiologist.  Left upper extremity was prepped and draped in normal  sterile orthopaedic fashion.  A surgical pause was performed between the surgeons, anesthesia, and operating room staff, and all were in agreement as to the patient, procedure, and site of procedure.  Tourniquet at the proximal aspect of the extremity was inflated to 250 mmHg after exsanguination of the arm with an Esmarch bandage  Incision was made over the transverse carpal ligament and carried into the subcutaneous tissues by spreading technique.  Bipolar electrocautery was used to obtain hemostasis.  The palmar fascia was sharply incised.  The transverse carpal ligament was identified.  The fascia distal to the ligament was opened.  Retractor was placed and the flexor tendons were identified.  The flexor tendon to the ring finger was identified and retracted radially.  The transverse carpal ligament was then incised from distal to proximal under direct visualization.  Scissors were used to split the distal aspect of the volar antebrachial fascia.  A finger was placed into the wound to ensure complete decompression, which was the case.  The nerve was examined.  It was adherent to the radial leaflet.  The motor branch was identified and was intact.  The wound was copiously irrigated with sterile saline.  It was then closed with 4-0 nylon in a horizontal mattress fashion.  Incision was then made over the mass on the thenar eminence.  This was carried into subcutaneous tissues by spreading technique.  The mass was whitish in coloration.  Was carefully  freed up from surrounding soft tissue attachments.  It was filled with creamy white material.  It was cystic in nature.  It was carefully freed up and removed.  It was in the subcutaneous tissues.  It was sent to pathology for examination.  The digital nerves to the thumb were ulnar to the mass.  The wound was copiously irrigated with sterile saline.  The wounds were injected with 0.25% plain Marcaine  to aid in postoperative analgesia.  They were dressed with sterile  Xeroform, 4x4s, an ABD, and wrapped with Kerlix and an Ace bandage.  Tourniquet was deflated at 31 minutes.  Fingertips were pink with brisk capillary refill after deflation of the tourniquet.  Operative drapes were broken down.  The patient was awoken from anesthesia safely.  He was transferred back to stretcher and taken to the PACU in stable condition.  I will see him back in the office in 1 week for postoperative followup.  I will give him a prescription for Tramadol  50 mg 1-2 tabs PO q6 hours prn pain, dispense # 20.    Joe Gee, MD Electronically signed, 12/07/23

## 2023-12-07 NOTE — Anesthesia Postprocedure Evaluation (Signed)
 Anesthesia Post Note  Patient: [name]  Procedure(s) Performed: LEFT CARPAL TUNNEL RELEASE (Left: Hand) LEFT PALM MASS EXCISION (Left: Hand)     Patient location during evaluation: PACU Anesthesia Type: General Level of consciousness: awake and alert, oriented and patient cooperative Pain management: pain level controlled Vital Signs Assessment: post-procedure vital signs reviewed and stable Respiratory status: spontaneous breathing, nonlabored ventilation and respiratory function stable Cardiovascular status: blood pressure returned to baseline and stable Postop Assessment: no apparent nausea or vomiting Anesthetic complications: no   No notable events documented.  Last Vitals:  Vitals:   12/07/23 1430 12/07/23 1449  BP: 131/73 126/62  Pulse: 76 70  Resp: 20 18  Temp:  (!) 36.3 C  SpO2: 93% 96%    Last Pain:  Vitals:   12/07/23 1449  TempSrc: Temporal  PainSc: 0-No pain                 Jacquelyne Matte

## 2023-12-07 NOTE — H&P (Signed)
 [name] is an 68 y.o. male.   Chief Complaint: carpal tunnel syndrome, mass HPI: 68 y.o. yo male with numbness and tingling left hand.  Positive nerve conduction studies. He wishes to have left carpal tunnel release.   Allergies:  Allergies  Allergen Reactions   Statins Other (See Comments)    Bone and muscle pain   Crestor  [Rosuvastatin ]     myalgias   Lipitor [Atorvastatin]     myalgias   Oxycodone  Nausea And Vomiting   Hydrocodone Nausea And Vomiting    Past Medical History:  Diagnosis Date   Cervical spine ankylosis    CKD (chronic kidney disease) stage 3, GFR 30-59 ml/min (HCC)    COLONIC POLYPS, HX OF    CORONARY ARTERY DISEASE    a. s/p stent to RCA 2005. b. Stent to Cx 2007. c.  CABG X4 2014; d. LHC 04/19/18 occluded SVG-Circ, DES to in-stent restenosis of circ.    COVID-19 2020   Diverticulosis    DIVERTICULOSIS, COLON    DYSPHAGIA UNSPECIFIED    intermittent   GERD    HEMORRHOIDS    HYPERLIPIDEMIA    HYPERTENSION    HYPERTRIGLYCERIDEMIA    Left carotid artery occlusion    OBESITY    Postoperative atrial fibrillation (HCC) 2014   Pseudoaneurysm of left femoral artery (HCC) 02/16/2022   SLEEP APNEA    on CPAP   Statin intolerance    Tobacco abuse    Vertebral artery occlusion, left     Past Surgical History:  Procedure Laterality Date   ABDOMINAL AORTOGRAM W/LOWER EXTREMITY N/A 02/13/2022   Procedure: ABDOMINAL AORTOGRAM W/LOWER EXTREMITY;  Surgeon: Avanell Leigh, MD;  Location: [facility];  Service: Cardiovascular;  Laterality: N/A;   ABDOMINAL AORTOGRAM W/LOWER EXTREMITY N/A 08/28/2022   Procedure: ABDOMINAL AORTOGRAM W/LOWER EXTREMITY;  Surgeon: Young Hensen, MD;  Location: [facility];  Service: Cardiovascular;  Laterality: N/A;   ACHILLES TENDON REPAIR     BACK SURGERY     lower back   CARDIAC CATHETERIZATION     CATARACT EXTRACTION Bilateral    CORONARY ARTERY BYPASS GRAFT  08/18/2012   Procedure: CORONARY  ARTERY BYPASS GRAFTING (CABG);  Surgeon: Bartley Lightning, MD;  Location: [facility];  Service: Open Heart Surgery;  Laterality: N/A;  CABG x four;  using left internal mammary artery and right leg greater saphenous vein harvested endoscopically   CORONARY STENT INTERVENTION N/A 04/19/2018   Procedure: CORONARY STENT INTERVENTION;  Surgeon: Avanell Leigh, MD;  Location: [facility];  Service: Cardiovascular;  Laterality: N/A;   ENDARTERECTOMY Right 09/04/2023   Procedure: ENDARTERECTOMY CAROTID;  Surgeon: Margherita Shell, MD;  Location: [facility] OR;  Service: Vascular;  Laterality: Right;   ENDARTERECTOMY FEMORAL Left 02/17/2022   Procedure: ENDARTERECTOMY FEMORAL;  Surgeon: Young Hensen, MD;  Location: [facility] OR;  Service: Vascular;  Laterality: Left;   INTRAOPERATIVE ARTERIOGRAM Left 02/17/2022   Procedure: INTRA OPERATIVE ARTERIOGRAM;  Surgeon: Young Hensen, MD;  Location: [facility] OR;  Service: Vascular;  Laterality: Left;   KNEE ARTHROSCOPY     RIGHT   LEFT HEART CATH AND CORS/GRAFTS ANGIOGRAPHY N/A 04/19/2018   Procedure: LEFT HEART CATH AND CORS/GRAFTS ANGIOGRAPHY;  Surgeon: Avanell Leigh, MD;  Location: [facility];  Service: Cardiovascular;  Laterality: N/A;   LUMBAR LAMINECTOMY/DECOMPRESSION MICRODISCECTOMY N/A 12/05/2015   Procedure: LUMBAR 2-3, LUMBAR 3-4 DECOMPRESSION ;  Surgeon: [name], MD;  Location: [facility] OR;  Service: Orthopedics;  Laterality: N/A;   PATCH ANGIOPLASTY Left 02/17/2022   Procedure: PATCH ANGIOPLASTY Left Common Femoral Artery.;  Surgeon: Young Hensen, MD;  Location: [facility] OR;  Service: Vascular;  Laterality: Left;   PERIPHERAL VASCULAR ATHERECTOMY  02/13/2022   Procedure: PERIPHERAL VASCULAR ATHERECTOMY;  Surgeon: Avanell Leigh, MD;  Location: [facility];  Service: Cardiovascular;;  Rt SFA   PERIPHERAL VASCULAR INTERVENTION Left 08/28/2022   Procedure: PERIPHERAL VASCULAR INTERVENTION;  Surgeon: Young Hensen, MD;   Location: [facility];  Service: Cardiovascular;  Laterality: Left;  SFA/POP   Right long finger extensor tendon repair     THROMBECTOMY FEMORAL ARTERY Left 02/17/2022   Procedure: Repair of Left COMMON FEMORAL PSEUDOANEURYSM.;  Surgeon: Young Hensen, MD;  Location: [facility] OR;  Service: Vascular;  Laterality: Left;    Family History: Family History  Problem Relation Age of Onset   Cancer Mother    Heart disease Brother     Social History:   reports that he has been smoking cigarettes. He has a 43 pack-year smoking history. He has been exposed to tobacco smoke. He has never used smokeless tobacco. He reports that he does not currently use alcohol . He reports that he does not use drugs.  Medications: Medications Prior to Admission  Medication Sig Dispense Refill   acetaminophen  (TYLENOL ) 500 MG tablet Take 1,000 mg by mouth every 6 (six) hours as needed for mild pain.     amLODipine  (NORVASC ) 10 MG tablet Take 1 tablet (10 mg total) by mouth daily. 90 tablet 3   aspirin  EC 81 MG tablet Take 1 tablet (81 mg total) by mouth daily. 90 tablet 3   cetirizine  (ZYRTEC ) 10 MG tablet Take 1 tablet (10 mg total) by mouth daily. 30 tablet 11   clopidogrel  (PLAVIX ) 75 MG tablet Take 1 tablet (75 mg total) by mouth daily. 90 tablet 3   fluticasone  (FLONASE ) 50 MCG/ACT nasal spray Place into the nose.     losartan -hydrochlorothiazide  (HYZAAR) 100-12.5 MG tablet Take 1 tablet by mouth daily. 90 tablet 3   pantoprazole  (PROTONIX ) 40 MG tablet Take 1 tablet (40 mg total) by mouth daily. 90 tablet 2   REPATHA  SURECLICK 140 MG/ML SOAJ INJECT ONE ML INTO THE SKIN EVERY 14 DAYS. 6 mL 3   fluticasone  (FLONASE ) 50 MCG/ACT nasal spray Place 2 sprays into both nostrils daily. 16 g 6   HYDROmorphone  (DILAUDID ) 2 MG tablet Take 1 tablet (2 mg total) by mouth every 4 (four) hours as needed for severe pain. 10 tablet 0   methocarbamol  (ROBAXIN ) 500 MG tablet Take 500 mg by mouth every 8 (eight) hours as  needed for muscle spasms.     nitroGLYCERIN  (NITROSTAT ) 0.4 MG SL tablet Place 1 tablet (0.4 mg total) under the tongue every 5 (five) minutes as needed for chest pain. 25 tablet 3   Probiotic Product (PROBIOTIC-10 ULTIMATE) CAPS Take 1 capsule by mouth daily.      No results found for this or any previous visit (from the past 48 hours).  No results found.    Blood pressure (!) 146/78, pulse 72, temperature 98.3 F (36.8 C), temperature source Oral, resp. rate 16, height 5\' 9"  (1.753 m), weight 98 kg, SpO2 98%.  General appearance: alert, cooperative, and appears stated age Head: Normocephalic, without obvious abnormality, atraumatic Neck: supple, symmetrical, trachea midline Extremities: Intact capillary refill all digits. Decreased sensation in fingers. +epl/fpl/io.  No wounds. Mass in left thenar eminence. Skin: Skin color, texture,  turgor normal. No rashes or lesions Neurologic: Grossly normal Incision/Wound: none  Assessment/Plan Left carpal tunnel syndrome and palm mass.  Non operative and operative treatment options have been discussed with the patient and patient wishes to proceed with operative treatment. Risks, benefits, and alternatives of surgery have been discussed and the patient agrees with the plan of care.   Khalila Buechner 12/07/2023, 1:10 PM

## 2023-12-07 NOTE — Discharge Instructions (Signed)

## 2023-12-07 NOTE — Transfer of Care (Signed)
 Immediate Anesthesia Transfer of Care Note  Patient: [name]  Procedure(s) Performed: LEFT CARPAL TUNNEL RELEASE (Left: Hand) LEFT PALM MASS EXCISION (Left: Hand)  Patient Location: PACU  Anesthesia Type:General  Level of Consciousness: awake, alert , and patient cooperative  Airway & Oxygen Therapy: Patient Spontanous Breathing and Patient connected to face mask oxygen  Post-op Assessment: Report given to RN and Post -op Vital signs reviewed and stable  Post vital signs: Reviewed and stable  Last Vitals:  Vitals Value Taken Time  BP    Temp    Pulse    Resp    SpO2      Last Pain:  Vitals:   12/07/23 1127  TempSrc: Oral  PainSc: 0-No pain      Patients Stated Pain Goal: 4 (12/07/23 1127)  Complications: No notable events documented.

## 2023-12-07 NOTE — Anesthesia Preprocedure Evaluation (Addendum)
 Anesthesia Evaluation  Patient identified by MRN, date of birth, ID band Patient awake    Reviewed: Allergy & Precautions, NPO status , Patient's Chart, lab work & pertinent test results  History of Anesthesia Complications Negative for: history of anesthetic complications  Airway Mallampati: III  TM Distance: >3 FB Neck ROM: Full    Dental  (+) Teeth Intact, Dental Advisory Given   Pulmonary neg shortness of breath, sleep apnea and Continuous Positive Airway Pressure Ventilation , neg COPD, neg recent URI, Current Smoker and Patient abstained from smoking.   Pulmonary exam normal breath sounds clear to auscultation       Cardiovascular hypertension, Pt. on medications (-) angina + CAD, + Cardiac Stents and + Peripheral Vascular Disease   Rhythm:Regular   Mid RCA to Dist RCA lesion is 100% stenosed.  Ost LAD to Prox LAD lesion is 100% stenosed.  Prox RCA to Mid RCA lesion is 75% stenosed.  Prox Cx to Mid Cx lesion is 95% stenosed.  Origin lesion is 100% stenosed.  A stent was successfully placed.  Post intervention, there is a 0% residual stenosis.  LV end diastolic pressure is mildly elevated.  The left ventricular ejection fraction is 50-55% by visual estimate.  The left ventricular systolic function is normal.   Neg stress 2023   Neuro/Psych negative neurological ROS  negative psych ROS   GI/Hepatic Neg liver ROS,GERD  Medicated,,  Endo/Other  negative endocrine ROS    Renal/GU CRFRenal diseaseLab Results      Component                Value               Date                      NA                       134 (L)             11/30/2023                K                        4.2                 11/30/2023                CO2                      19 (L)              11/30/2023                GLUCOSE                  128 (H)             11/30/2023                BUN                      24 (H)               11/30/2023                CREATININE               1.87 (H)  11/30/2023                CALCIUM                   9.2                 11/30/2023                GFR                      70.77               08/02/2012                EGFR                     47 (L)              07/10/2023                GFRNONAA                 38 (L)              11/30/2023                Musculoskeletal   Abdominal   Peds  Hematology  (+) Blood dyscrasia, anemia Lab Results      Component                Value               Date                      WBC                      15.3 (H)            09/05/2023                HGB                      11.5 (L)            09/05/2023                HCT                      33.6 (L)            09/05/2023                MCV                      88.9                09/05/2023                PLT                      179                 09/05/2023             plavix    Anesthesia Other Findings   Reproductive/Obstetrics                             Anesthesia Physical Anesthesia Plan  ASA: 3  Anesthesia Plan:  General   Post-op Pain Management: Minimal or no pain anticipated   Induction: Intravenous  PONV Risk Score and Plan: 2 and Ondansetron  and Dexamethasone   Airway Management Planned: LMA  Additional Equipment: None  Intra-op Plan:   Post-operative Plan: Extubation in OR  Informed Consent: I have reviewed the patients History and Physical, chart, labs and discussed the procedure including the risks, benefits and alternatives for the proposed anesthesia with the patient or authorized representative who has indicated his/her understanding and acceptance.     Dental advisory given  Plan Discussed with: CRNA  Anesthesia Plan Comments:         Anesthesia Quick Evaluation

## 2023-12-07 NOTE — Anesthesia Procedure Notes (Signed)
 Procedure Name: LMA Insertion Date/Time: 12/07/2023 1:35 PM  Performed by: Raymona Caldwell, CRNAPre-anesthesia Checklist: Patient identified, Emergency Drugs available, Suction available and Patient being monitored Patient Re-evaluated:Patient Re-evaluated prior to induction Oxygen Delivery Method: Circle system utilized Preoxygenation: Pre-oxygenation with 100% oxygen Induction Type: IV induction Ventilation: Mask ventilation without difficulty LMA: LMA inserted LMA Size: 4.0 Number of attempts: 1 Airway Equipment and Method: Bite block Placement Confirmation: positive ETCO2, breath sounds checked- equal and bilateral and CO2 detector Tube secured with: Tape Dental Injury: Teeth and Oropharynx as per pre-operative assessment

## 2023-12-08 ENCOUNTER — Encounter (HOSPITAL_BASED_OUTPATIENT_CLINIC_OR_DEPARTMENT_OTHER): Payer: Self-pay | Admitting: Orthopedic Surgery

## 2023-12-08 LAB — SURGICAL PATHOLOGY

## 2023-12-20 ENCOUNTER — Encounter: Payer: Self-pay | Admitting: Cardiovascular Disease

## 2023-12-22 NOTE — Progress Notes (Signed)
 CARDIOLOGY OFFICE NOTE  Date:  12/30/2023    [name] Date of Birth: 09/11/1956 Medical Record #[id number]  PCP:  Donita Brooks, MD  Cardiologist:  Townsend Roger    History of Present Illness: [name] is a 68 y.o. male has a history  of CAD s/p PCI of RCA 2005, PCI of Cx 2007, CABG x 4 in 2014, PCI of OM 03/2018, post-op atrial fib (on amiodarone briefly), OSA on CPAP, hypertension, hyperlipidemia, obesity, GERD, CKD stage III, ongoing tobacco abuse & unable to take high dose statin now on Repatha    Left heart cath on 04/19/18 which revealed an occluded SVG to circumflex obtuse marginal branch. He had high-grade obtuse marginal branch in-stent restenosis of old stent and a DES was placed to open that stented area with good result. DAPT for minimum of 12 months recommended. Referred to the lipid clinic. Continued to smoke. Lung cancer screening CT negative 05/22/22    Seen in ER 05/01/21 for chest pain started when climbing through window and radiated to back R/O no acute ECG changes CT no dissection chronic focal dissection vs ulcer infra renal Ao stable since 11/22/17 emphysema and bilateral RAS  CXR subsegmental left lung atelectasis D/c home  PVD followed by Dr Allyson Sabal and Chestine Spore Had left common femoral pseudoaneurysm needing OR repair on 02/17/22 This is after access For right SFA atherectomy ABI"s improved from 0.7 range to 0.99 on right and 0.85 on left   Chronic neck pain using Dilaudid  Biggest issue is leg pain ABI's 07/16/23 with right ABI 0.82 and left 0.43 Left SFA/popliteal stent occluded on duplex Seen by vascular PA 07/07/23 and no intervention planned Indicated no ulcer or rest pain Still smoking 1 ppd Had right CEA 08/2023 and known CTO left ICA   Need epidural injection Dr Yevette Edwards and has trip planned to [place] returns 01/06/24 and has epidural planned 3/13. Ok to hold ASA/Plavix for 7 days before injection resume as soon as possible post injection ? 48  hours  No angina No TIA;s Denies claudication some neuropathic pain in feet Daughter getting married in [place] latter this week and he is going She is marrying an ex Environmental manager   Past Medical History:  Diagnosis Date   Cervical spine ankylosis    CKD (chronic kidney disease) stage 3, GFR 30-59 ml/min (HCC)    COLONIC POLYPS, HX OF    CORONARY ARTERY DISEASE    a. s/p stent to RCA 2005. b. Stent to Cx 2007. c.  CABG X4 2014; d. LHC 04/19/18 occluded SVG-Circ, DES to in-stent restenosis of circ.    COVID-19 2020   Diverticulosis    DIVERTICULOSIS, COLON    DYSPHAGIA UNSPECIFIED    intermittent   GERD    HEMORRHOIDS    HYPERLIPIDEMIA    HYPERTENSION    HYPERTRIGLYCERIDEMIA    Left carotid artery occlusion    OBESITY    Postoperative atrial fibrillation (HCC) 2014   Pseudoaneurysm of left femoral artery (HCC) 02/16/2022   SLEEP APNEA    on CPAP   Statin intolerance    Tobacco abuse    Vertebral artery occlusion, left     Past Surgical History:  Procedure Laterality Date   ABDOMINAL AORTOGRAM W/LOWER EXTREMITY N/A 02/13/2022   Procedure: ABDOMINAL AORTOGRAM W/LOWER EXTREMITY;  Surgeon: Runell Gess, MD;  [facility];  Service: Cardiovascular;  Laterality: N/A;   ABDOMINAL AORTOGRAM W/LOWER EXTREMITY N/A 08/28/2022  Procedure: ABDOMINAL AORTOGRAM W/LOWER EXTREMITY;  Surgeon: Cephus Shelling, MD;  Location: [facility] INVASIVE CV LAB;  Service: Cardiovascular;  Laterality: N/A;   ACHILLES TENDON REPAIR     BACK SURGERY     lower back   CARDIAC CATHETERIZATION     CARPAL TUNNEL RELEASE Left 12/07/2023   Procedure: LEFT CARPAL TUNNEL RELEASE;  Surgeon: Betha Loa, MD;  Location: [facility];  Service: Orthopedics;  Laterality: Left;  60 MIN   CATARACT EXTRACTION Bilateral    CORONARY ARTERY BYPASS GRAFT  08/18/2012   Procedure: CORONARY ARTERY BYPASS GRAFTING (CABG);  Surgeon: Alleen Borne, MD;  Location: [facility] OR;  Service: Open  Heart Surgery;  Laterality: N/A;  CABG x four;  using left internal mammary artery and right leg greater saphenous vein harvested endoscopically   CORONARY STENT INTERVENTION N/A 04/19/2018   Procedure: CORONARY STENT INTERVENTION;  Surgeon: Runell Gess, MD;  [facility];  Service: Cardiovascular;  Laterality: N/A;   ENDARTERECTOMY Right 09/04/2023   Procedure: ENDARTERECTOMY CAROTID;  Surgeon: Nada Libman, MD;  Location: [facility] OR;  Service: Vascular;  Laterality: Right;   ENDARTERECTOMY FEMORAL Left 02/17/2022   Procedure: ENDARTERECTOMY FEMORAL;  Surgeon: Cephus Shelling, MD;  Location: [facility] OR;  Service: Vascular;  Laterality: Left;   INTRAOPERATIVE ARTERIOGRAM Left 02/17/2022   Procedure: INTRA OPERATIVE ARTERIOGRAM;  Surgeon: Cephus Shelling, MD;  Location: [facility] OR;  Service: Vascular;  Laterality: Left;   KNEE ARTHROSCOPY     RIGHT   LEFT HEART CATH AND CORS/GRAFTS ANGIOGRAPHY N/A 04/19/2018   Procedure: LEFT HEART CATH AND CORS/GRAFTS ANGIOGRAPHY;  Surgeon: Runell Gess, MD;  [facility];  Service: Cardiovascular;  Laterality: N/A;   LUMBAR LAMINECTOMY/DECOMPRESSION MICRODISCECTOMY N/A 12/05/2015   Procedure: LUMBAR 2-3, LUMBAR 3-4 DECOMPRESSION ;  Surgeon: Estill Bamberg, MD;  [facility];  Service: Orthopedics;  Laterality: N/A;   MASS EXCISION Left 12/07/2023   Procedure: LEFT PALM MASS EXCISION;  Surgeon: Betha Loa, MD;  Location: [facility];  Service: Orthopedics;  Laterality: Left;   PATCH ANGIOPLASTY Left 02/17/2022   Procedure: PATCH ANGIOPLASTY Left Common Femoral Artery.;  Surgeon: Cephus Shelling, MD;  Location: [facility] OR;  Service: Vascular;  Laterality: Left;   PERIPHERAL VASCULAR ATHERECTOMY  02/13/2022   Procedure: PERIPHERAL VASCULAR ATHERECTOMY;  Surgeon: Runell Gess, MD;  [facility];  Service: Cardiovascular;;  Rt SFA   PERIPHERAL VASCULAR INTERVENTION Left 08/28/2022    Procedure: PERIPHERAL VASCULAR INTERVENTION;  Surgeon: Cephus Shelling, MD;  [facility];  Service: Cardiovascular;  Laterality: Left;  SFA/POP   Right long finger extensor tendon repair     THROMBECTOMY FEMORAL ARTERY Left 02/17/2022   Procedure: Repair of Left COMMON FEMORAL PSEUDOANEURYSM.;  Surgeon: Cephus Shelling, MD;  [facility];  Service: Vascular;  Laterality: Left;     Medications: Current Meds  Medication Sig   acetaminophen (TYLENOL) 500 MG tablet Take 1,000 mg by mouth every 6 (six) hours as needed for mild pain.   amLODipine (NORVASC) 10 MG tablet Take 1 tablet (10 mg total) by mouth daily.   aspirin EC 81 MG tablet Take 1 tablet (81 mg total) by mouth daily.   cetirizine (ZYRTEC) 10 MG tablet Take 1 tablet (10 mg total) by mouth daily.   clopidogrel (PLAVIX) 75 MG tablet Take 1 tablet (75 mg total) by mouth daily.   fluticasone (FLONASE) 50 MCG/ACT nasal spray Place  2 sprays into both nostrils daily.   losartan-hydrochlorothiazide (HYZAAR) 100-12.5 MG tablet Take 1 tablet by mouth daily.   methocarbamol (ROBAXIN) 500 MG tablet Take 500 mg by mouth every 8 (eight) hours as needed for muscle spasms.   nitroGLYCERIN (NITROSTAT) 0.4 MG SL tablet Place 1 tablet (0.4 mg total) under the tongue every 5 (five) minutes as needed for chest pain.   ondansetron (ZOFRAN) 4 MG tablet Take 1 tablet (4 mg total) by mouth every 8 (eight) hours as needed for nausea or vomiting.   pantoprazole (PROTONIX) 40 MG tablet Take 1 tablet (40 mg total) by mouth daily.   Probiotic Product (PROBIOTIC-10 ULTIMATE) CAPS Take 1 capsule by mouth daily.   REPATHA SURECLICK 140 MG/ML SOAJ INJECT ONE ML INTO THE SKIN EVERY 14 DAYS.   traMADol (ULTRAM) 50 MG tablet 1-2 tabs PO q6 hours prn pain     Allergies: Allergies  Allergen Reactions   Statins Other (See Comments)    Bone and muscle pain   Crestor [Rosuvastatin]     myalgias   Lipitor [Atorvastatin]     myalgias    Oxycodone Nausea And Vomiting   Hydrocodone Nausea And Vomiting    Social History: The patient  reports that he has been smoking cigarettes. He has a 43 pack-year smoking history. He has been exposed to tobacco smoke. He has never used smokeless tobacco. He reports that he does not currently use alcohol. He reports that he does not use drugs.   Family History: The patient's family history includes Cancer in his mother; Heart disease in his brother.   Review of Systems: Please see the history of present illness.   All other systems are reviewed and negative.   Physical Exam: VS:  There were no vitals taken for this visit. [name]  BMI There is no height or weight on file to calculate BMI.  Wt Readings from Last 3 Encounters:  12/07/23 216 lb 0.8 oz (98 kg)  09/28/23 215 lb 3.2 oz (97.6 kg)  09/04/23 210 lb (95.3 kg)    Affect appropriate Chronically ill male  HEENT: normal Neck supple with no adenopathy JVP normal right CEA  no thyromegaly Lungs COPD exp wheezing  Heart:  S1/S2 no murmur, no rub, gallop or click PMI normal post sternotomy Abdomen: benighn, BS positve, no tenderness, no AAA no bruit.  No HSM or HJR Scar in left femoral area from surgery decreased pulses left pedal No edema Neuro non-focal Skin warm and dry No muscular weakness    LABORATORY DATA:  EKG:   12/30/2023 SR RBBB old IMI  Lab Results  Component Value Date   WBC 15.3 (H) 09/05/2023   HGB 11.5 (L) 09/05/2023   HCT 33.6 (L) 09/05/2023   PLT 179 09/05/2023   GLUCOSE 128 (H) 11/30/2023   CHOL 80 09/05/2023   TRIG 66 09/05/2023   HDL 36 (L) 09/05/2023   LDLDIRECT 77.2 07/10/2009   LDLCALC 31 09/05/2023   ALT 22 08/26/2023   AST 22 08/26/2023   NA 134 (L) 11/30/2023   K 4.2 11/30/2023   CL 101 11/30/2023   CREATININE 1.87 (H) 11/30/2023   BUN 24 (H) 11/30/2023   CO2 19 (L) 11/30/2023   PSA 2.42 07/10/2023   INR 1.0 08/26/2023   HGBA1C 5.7 (H) 07/10/2023       Other Studies Reviewed  Today:  CORONARY STENT INTERVENTION 03/2018  LEFT HEART CATH AND CORS/GRAFTS ANGIOGRAPHY  Conclusion      Mid RCA to [facility]  RCA lesion is 100% stenosed. Ost LAD to Prox LAD lesion is 100% stenosed. Prox RCA to Mid RCA lesion is 75% stenosed. Prox Cx to Mid Cx lesion is 95% stenosed. Origin lesion is 100% stenosed. A stent was successfully placed. Post intervention, there is a 0% residual stenosis. LV end diastolic pressure is mildly elevated. The left ventricular ejection fraction is 50-55% by visual estimate. The left ventricular systolic function is normal.            Assessment/Plan:  1. CAD - has had multiple caths/PCI/CABG and last was PCI in June of 2019 - to occluded SVG to LCX/OM branch - had high grade OM branch ISR of the old stent - on lifelong DAPT.  Myovue 08/03/20 EF 67% normal no ischemia  seen in ED 05/01/21 with chest pain R/O New nitro called in I suspect he will need left fem pop in near future. He had a non ischemic myovue done 09/10/22 and has no angina  2. HTN - BP high add Norvasc 10 mg daily low sodium diet   3. HLD - continue Repatha LDL 46    4. Tobacco abuse - with emphysema  - he is not ready to stop yet. Update lung cancer screening CT no cancer on CT done 08/13/23   5. PVD:  f/u [place] post repair of left femoral pseudoaneurysm and now occluded left SFA/Popliteal stent with ABI on left 0.43  See above regarding smoking cessation F/U [facility]  Duplex 07/17/23 with CTO left ICA and right 90-99% Had CEA of right carotid 09/04/23 with Dr Myra Gianotti  6. Preoperative:  Epidural injection 01/07/24 Dr Yevette Edwards. Ok to hold ASA/Plavix 7 days before   Current medicines are reviewed with the patient today.  The patient does not have concerns regarding medicines other than what has been noted above.  The following changes have been made:  See above.   F/U [facility] for PVD Dr Chestine Spore  Lung cancer CT 07/2024    Disposition:   f/u 6 months    Patient is agreeable to this  plan and will call if any problems develop in the interim.   Signed: Charlton Haws, MD  12/30/2023 8:04 AM  [facility] [address] [place], [place]  [address] Phone: [phone] Fax: [phone]

## 2023-12-30 ENCOUNTER — Ambulatory Visit: Payer: 59 | Attending: Cardiovascular Disease | Admitting: Cardiovascular Disease

## 2023-12-30 ENCOUNTER — Encounter: Payer: Self-pay | Admitting: Cardiovascular Disease

## 2023-12-30 VITALS — BP 152/82 | HR 67 | Ht 69.0 in | Wt 220.0 lb

## 2023-12-30 DIAGNOSIS — Z01810 Encounter for preprocedural cardiovascular examination: Secondary | ICD-10-CM | POA: Diagnosis not present

## 2023-12-30 DIAGNOSIS — I10 Essential (primary) hypertension: Secondary | ICD-10-CM

## 2023-12-30 DIAGNOSIS — I2510 Atherosclerotic heart disease of native coronary artery without angina pectoris: Secondary | ICD-10-CM

## 2023-12-30 DIAGNOSIS — Z87891 Personal history of nicotine dependence: Secondary | ICD-10-CM

## 2023-12-30 DIAGNOSIS — I999 Unspecified disorder of circulatory system: Secondary | ICD-10-CM

## 2023-12-30 DIAGNOSIS — E785 Hyperlipidemia, unspecified: Secondary | ICD-10-CM

## 2023-12-30 NOTE — Patient Instructions (Signed)
 Medication Instructions:  Your physician recommends that you continue on your current medications as directed. Please refer to the Current Medication list given to you today.  *If you need a refill on your cardiac medications before your next appointment, please call your pharmacy*  Lab Work: If you have labs (blood work) drawn today and your tests are completely normal, you will receive your results only by: MyChart Message (if you have MyChart) OR A paper copy in the mail If you have any lab test that is abnormal or we need to change your treatment, we will call you to review the results.  Testing/Procedures: None ordered today.  Follow-Up: At [facility], you and your health needs are our priority.  As part of our continuing mission to provide you with exceptional heart care, we have created designated Provider Care Teams.  These Care Teams include your primary Cardiologist (physician) and Advanced Practice Providers (APPs -  Physician Assistants and Nurse Practitioners) who all work together to provide you with the care you need, when you need it.  We recommend signing up for the patient portal called "MyChart".  Sign up information is provided on this After Visit Summary.  MyChart is used to connect with patients for Virtual Visits (Telemedicine).  Patients are able to view lab/test results, encounter notes, upcoming appointments, etc.  Non-urgent messages can be sent to your provider as well.   To learn more about what you can do with MyChart, go to [email or web address].    Your next appointment:   6 month(s)  Provider:   Charlton Haws, MD     Other Instructions    1st Floor: - Lobby - Registration  - Pharmacy  - Lab - Cafe  2nd Floor: - PV Lab - Diagnostic Testing (echo, CT, nuclear med)  3rd Floor: - Vacant  4th Floor: - TCTS (cardiothoracic surgery) - AFib Clinic - Structural Heart Clinic - Vascular Surgery  - Vascular Ultrasound  5th Floor: -  HeartCare Cardiology (general and EP) - Clinical Pharmacy for coumadin, hypertension, lipid, weight-loss medications, and med management appointments    Valet parking services will be available as well.

## 2024-01-08 ENCOUNTER — Other Ambulatory Visit (INDEPENDENT_AMBULATORY_CARE_PROVIDER_SITE_OTHER): Payer: Self-pay | Admitting: Otolaryngology

## 2024-01-08 ENCOUNTER — Telehealth (INDEPENDENT_AMBULATORY_CARE_PROVIDER_SITE_OTHER): Payer: Self-pay

## 2024-01-08 MED ORDER — AZELASTINE HCL 0.1 % NA SOLN: 2.0000 | Freq: Two times a day (BID) | NASAL | 12 refills | Status: AC

## 2024-01-08 NOTE — Telephone Encounter (Signed)
 Spoke to patient about nosebleed with Flonase, he is using saline and neosporin as well still having issues.  Dr Irene Pap will call in Asteline nasal spray, he is still to use the saline as well and Vaseline.

## 2024-01-12 ENCOUNTER — Encounter (HOSPITAL_COMMUNITY): Payer: BC Managed Care – PPO

## 2024-01-12 ENCOUNTER — Ambulatory Visit: Payer: BC Managed Care – PPO

## 2024-01-20 NOTE — Telephone Encounter (Signed)
 See note from 01/08/24

## 2024-02-02 ENCOUNTER — Ambulatory Visit: Admitting: Family Medicine

## 2024-02-02 ENCOUNTER — Encounter: Payer: Self-pay | Admitting: Family Medicine

## 2024-02-02 VITALS — BP 130/82 | HR 79 | Temp 98.5°F | Ht 69.0 in | Wt 224.0 lb

## 2024-02-02 DIAGNOSIS — S61012A Laceration without foreign body of left thumb without damage to nail, initial encounter: Secondary | ICD-10-CM | POA: Diagnosis not present

## 2024-02-02 DIAGNOSIS — Z23 Encounter for immunization: Secondary | ICD-10-CM

## 2024-02-02 NOTE — Progress Notes (Signed)
 Patient Office Visit  Assessment & Plan:  Need for Tdap vaccination -     Tdap vaccine greater than or equal to 70yo IM  Laceration of left thumb, foreign body presence unspecified, nail damage status unspecified, initial encounter    Return if symptoms worsen or fail to improve.   Subjective:    Patient ID: [name], male    DOB: 1956/02/28  Age: 68 y.o. MRN: [id number]  Chief Complaint  Patient presents with   Laceration    Left thumb. Happened around 12pm today.     Laceration    Laceration left thumb Cut himself today around 12 PM, pt is both on ASA and Plavix which made him bleed more than usual. Pt placed pressure on it and now bleeding stopped. Pt is able to move thumb, not having any pain with it.  Does not have an UTD tetanus shot. Last one documented was 1992 Pt wonders about super glue the laceration. Does not look like it needs sutures. Pt is right hand dominant so does not use left hand as much.   The ASCVD Risk score (Arnett DK, et al., 2019) failed to calculate for the following reasons:   The valid total cholesterol range is 130 to 320 mg/dL  Past Medical History:  Diagnosis Date   Cervical spine ankylosis    CKD (chronic kidney disease) stage 3, GFR 30-59 ml/min (HCC)    COLONIC POLYPS, HX OF    CORONARY ARTERY DISEASE    a. s/p stent to RCA 2005. b. Stent to Cx 2007. c.  CABG X4 2014; d. LHC 04/19/18 occluded SVG-Circ, DES to in-stent restenosis of circ.    COVID-19 2020   Diverticulosis    DIVERTICULOSIS, COLON    DYSPHAGIA UNSPECIFIED    intermittent   GERD    HEMORRHOIDS    HYPERLIPIDEMIA    HYPERTENSION    HYPERTRIGLYCERIDEMIA    Left carotid artery occlusion    OBESITY    Postoperative atrial fibrillation (HCC) 2014   Pseudoaneurysm of left femoral artery (HCC) 02/16/2022   SLEEP APNEA    on CPAP   Statin intolerance    Tobacco abuse    Vertebral artery occlusion, left    Past Surgical History:  Procedure Laterality Date    ABDOMINAL AORTOGRAM W/LOWER EXTREMITY N/A 02/13/2022   Procedure: ABDOMINAL AORTOGRAM W/LOWER EXTREMITY;  Surgeon: Runell Gess, MD;  [facility];  Service: Cardiovascular;  Laterality: N/A;   ABDOMINAL AORTOGRAM W/LOWER EXTREMITY N/A 08/28/2022   Procedure: ABDOMINAL AORTOGRAM W/LOWER EXTREMITY;  Surgeon: Cephus Shelling, MD;  [facility];  Service: Cardiovascular;  Laterality: N/A;   ACHILLES TENDON REPAIR     BACK SURGERY     lower back   CARDIAC CATHETERIZATION     CARPAL TUNNEL RELEASE Left 12/07/2023   Procedure: LEFT CARPAL TUNNEL RELEASE;  Surgeon: Betha Loa, MD;  Location: [facility];  Service: Orthopedics;  Laterality: Left;  60 MIN   CATARACT EXTRACTION Bilateral    CORONARY ARTERY BYPASS GRAFT  08/18/2012   Procedure: CORONARY ARTERY BYPASS GRAFTING (CABG);  Surgeon: Alleen Borne, MD;  Location: [facility] OR;  Service: Open Heart Surgery;  Laterality: N/A;  CABG x four;  using left internal mammary artery and right leg greater saphenous vein harvested endoscopically   CORONARY STENT INTERVENTION N/A 04/19/2018   Procedure: CORONARY STENT INTERVENTION;  Surgeon: Runell Gess, MD;  [facility];  Service: Cardiovascular;  Laterality: N/A;   ENDARTERECTOMY Right 09/04/2023   Procedure: ENDARTERECTOMY CAROTID;  Surgeon: Nada Libman, MD;  Location: [facility] OR;  Service: Vascular;  Laterality: Right;   ENDARTERECTOMY FEMORAL Left 02/17/2022   Procedure: ENDARTERECTOMY FEMORAL;  Surgeon: Cephus Shelling, MD;  Location: [facility] OR;  Service: Vascular;  Laterality: Left;   INTRAOPERATIVE ARTERIOGRAM Left 02/17/2022   Procedure: INTRA OPERATIVE ARTERIOGRAM;  Surgeon: Cephus Shelling, MD;  Location: [facility] OR;  Service: Vascular;  Laterality: Left;   KNEE ARTHROSCOPY     RIGHT   LEFT HEART CATH AND CORS/GRAFTS ANGIOGRAPHY N/A 04/19/2018   Procedure: LEFT HEART CATH AND CORS/GRAFTS ANGIOGRAPHY;  Surgeon: Runell Gess, MD;  [facility];  Service: Cardiovascular;  Laterality: N/A;   LUMBAR LAMINECTOMY/DECOMPRESSION MICRODISCECTOMY N/A 12/05/2015   Procedure: LUMBAR 2-3, LUMBAR 3-4 DECOMPRESSION ;  Surgeon: Estill Bamberg, MD;  [facility];  Service: Orthopedics;  Laterality: N/A;   MASS EXCISION Left 12/07/2023   Procedure: LEFT PALM MASS EXCISION;  Surgeon: Betha Loa, MD;  Location: [facility];  Service: Orthopedics;  Laterality: Left;   PATCH ANGIOPLASTY Left 02/17/2022   Procedure: PATCH ANGIOPLASTY Left Common Femoral Artery.;  Surgeon: Cephus Shelling, MD;  Location: [facility] OR;  Service: Vascular;  Laterality: Left;   PERIPHERAL VASCULAR ATHERECTOMY  02/13/2022   Procedure: PERIPHERAL VASCULAR ATHERECTOMY;  Surgeon: Runell Gess, MD;  [facility];  Service: Cardiovascular;;  Rt SFA   PERIPHERAL VASCULAR INTERVENTION Left 08/28/2022   Procedure: PERIPHERAL VASCULAR INTERVENTION;  Surgeon: Cephus Shelling, MD;  [facility];  Service: Cardiovascular;  Laterality: Left;  SFA/POP   Right long finger extensor tendon repair     THROMBECTOMY FEMORAL ARTERY Left 02/17/2022   Procedure: Repair of Left COMMON FEMORAL PSEUDOANEURYSM.;  Surgeon: Cephus Shelling, MD;  [facility];  Service: Vascular;  Laterality: Left;   Social History   Tobacco Use   Smoking status: Every Day    Current packs/day: 1.00    Average packs/day: 1 pack/day for 43.0 years (43.0 ttl pk-yrs)    Types: Cigarettes    Passive exposure: Current (Wife)   Smokeless tobacco: Never   Tobacco comments:    Pt still smoking 1 PPD 09/02/2022 PAP  Vaping Use   Vaping status: Never Used  Substance Use Topics   Alcohol use: Not Currently    Comment: pt quit drinking around June 2024   Drug use: No   Family History  Problem Relation Age of Onset   Cancer Mother    Heart disease Brother    Allergies  Allergen Reactions   Statins Other (See  Comments)    Bone and muscle pain   Crestor [Rosuvastatin]     myalgias   Lipitor [Atorvastatin]     myalgias   Oxycodone Nausea And Vomiting   Hydrocodone Nausea And Vomiting    ROS    Objective:    BP 130/82   Pulse 79   Temp 98.5 F (36.9 C)   Ht 5\' 9"  (1.753 m)   Wt 224 lb (101.6 kg)   SpO2 99%   BMI 33.08 kg/m  BP Readings from Last 3 Encounters:  02/02/24 130/82  12/30/23 (!) 152/82  12/07/23 126/62   Wt Readings from Last 3 Encounters:  02/02/24 224 lb (101.6 kg)  12/30/23 220 lb (99.8 kg)  12/07/23 216 lb 0.8 oz (98 kg)    Physical Exam Vitals and nursing note reviewed.  Constitutional:      General: He is not in acute distress.    Comments: Comes in with his wife  Skin:    Findings: Signs of injury present.     Comments: Left thumb-1.5 cm linear laceration, no debris, no bleeding noted. Pt is able to move finger, nontender, neurovascularly intact  Neurological:     Mental Status: He is alert.      No results found for any visits on 02/02/24.

## 2024-02-18 ENCOUNTER — Other Ambulatory Visit: Payer: Self-pay | Admitting: Orthopedic Surgery

## 2024-02-26 ENCOUNTER — Other Ambulatory Visit: Payer: Self-pay | Admitting: Physician Assistant

## 2024-02-26 DIAGNOSIS — E782 Mixed hyperlipidemia: Secondary | ICD-10-CM

## 2024-02-26 DIAGNOSIS — I2510 Atherosclerotic heart disease of native coronary artery without angina pectoris: Secondary | ICD-10-CM

## 2024-02-26 DIAGNOSIS — I10 Essential (primary) hypertension: Secondary | ICD-10-CM

## 2024-02-26 DIAGNOSIS — N183 Chronic kidney disease, stage 3 unspecified: Secondary | ICD-10-CM

## 2024-03-08 NOTE — Pre-Procedure Instructions (Signed)
 Surgical Instructions   Your procedure is scheduled on Mar 23, 2024. Report to [facility] Main Entrance "A" at 5:30 A.M., then check in with the Admitting office. Any questions or running late day of surgery: call [phone]  Questions prior to your surgery date: call [phone], Monday-Friday, 8am-4pm. If you experience any cold or flu symptoms such as cough, fever, chills, shortness of breath, etc. between now and your scheduled surgery, please notify us  at the above number.     Remember:  Do not eat after midnight the night before your surgery  You may drink clear liquids until 4:30 AM the morning of your surgery.   Clear liquids allowed are: Water , Non-Citrus Juices (without pulp), Carbonated Beverages, Clear Tea (no milk, honey, etc.), Black Coffee Only (NO MILK, CREAM OR POWDERED CREAMER of any kind), and Gatorade.  Patient Instructions  The night before surgery:  No food after midnight. ONLY clear liquids after midnight  The day of surgery (if you do NOT have diabetes):  Drink ONE (1) Pre-Surgery Clear Ensure by 4:30 AM the morning of surgery. Drink in one sitting. Do not sip.  This drink was given to you during your hospital  pre-op appointment visit.  Nothing else to drink after completing the  Pre-Surgery Clear Ensure.         If you have questions, please contact your surgeon's office.    Take these medicines the morning of surgery with A SIP OF WATER : azelastine  (ASTELIN ) nasal spray  pantoprazole  (PROTONIX )    May take these medicines IF NEEDED: acetaminophen  (TYLENOL )  methocarbamol  (ROBAXIN )  nitroGLYCERIN  (NITROSTAT ) - if dose taken prior to surgery, please call either of the above phone numbers   Follow your surgeon's instructions on when to stop Asprin/clopidogrel  (PLAVIX ).  If no instructions were given by your surgeon then you will need to call the office to get those instructions.     One week prior to surgery, STOP taking any Aleve, Naproxen,  Ibuprofen, Motrin, Advil, Goody's, BC's, all herbal medications, fish oil, and non-prescription vitamins.                     Do NOT Smoke (Tobacco/Vaping) for 24 hours prior to your procedure.  If you use a CPAP at night, you may bring your mask/headgear for your overnight stay.   You will be asked to remove any contacts, glasses, piercing's, hearing aid's, dentures/partials prior to surgery. Please bring cases for these items if needed.    Patients discharged the day of surgery will not be allowed to drive home, and someone needs to stay with them for 24 hours.  SURGICAL WAITING ROOM VISITATION Patients may have no more than 2 support people in the waiting area - these visitors may rotate.   Pre-op nurse will coordinate an appropriate time for 1 ADULT support person, who may not rotate, to accompany patient in pre-op.  Children under the age of 17 must have an adult with them who is not the patient and must remain in the main waiting area with an adult.  If the patient needs to stay at the hospital during part of their recovery, the visitor guidelines for inpatient rooms apply.  Please refer to the [facility] website for the visitor guidelines for any additional information.   If you received a COVID test during your pre-op visit  it is requested that you wear a mask when out in public, stay away from anyone that may not be feeling well and  notify your surgeon if you develop symptoms. If you have been in contact with anyone that has tested positive in the last 10 days please notify you surgeon.      Pre-operative 5 CHG Bathing Instructions   You can play a key role in reducing the risk of infection after surgery. Your skin needs to be as free of germs as possible. You can reduce the number of germs on your skin by washing with CHG (chlorhexidine  gluconate) soap before surgery. CHG is an antiseptic soap that kills germs and continues to kill germs even after washing.   DO NOT use if  you have an allergy to chlorhexidine /CHG or antibacterial soaps. If your skin becomes reddened or irritated, stop using the CHG and notify one of our RNs at [phone].   Please shower with the CHG soap starting 4 days before surgery using the following schedule:     Please keep in mind the following:  DO NOT shave, including legs and underarms, starting the day of your first shower.   You may shave your face at any point before/day of surgery.  Place clean sheets on your bed the day you start using CHG soap. Use a clean washcloth (not used since being washed) for each shower. DO NOT sleep with pets once you start using the CHG.   CHG Shower Instructions:  Wash your face and private area with normal soap. If you choose to wash your hair, wash first with your normal shampoo.  After you use shampoo/soap, rinse your hair and body thoroughly to remove shampoo/soap residue.  Turn the water  OFF and apply about 3 tablespoons (45 ml) of CHG soap to a CLEAN washcloth.  Apply CHG soap ONLY FROM YOUR NECK DOWN TO YOUR TOES (washing for 3-5 minutes)  DO NOT use CHG soap on face, private areas, open wounds, or sores.  Pay special attention to the area where your surgery is being performed.  If you are having back surgery, having someone wash your back for you may be helpful. Wait 2 minutes after CHG soap is applied, then you may rinse off the CHG soap.  Pat dry with a clean towel  Put on clean clothes/pajamas   If you choose to wear lotion, please use ONLY the CHG-compatible lotions that are listed below.  Additional instructions for the day of surgery: DO NOT APPLY any lotions, deodorants, cologne, or perfumes.   Do not bring valuables to the hospital. [facility] is not responsible for any belongings/valuables. Do not wear nail polish, gel polish, artificial nails, or any other type of covering on natural nails (fingers and toes) Do not wear jewelry or makeup Put on clean/comfortable clothes.   Please brush your teeth.  Ask your nurse before applying any prescription medications to the skin.     CHG Compatible Lotions   Aveeno Moisturizing lotion  Cetaphil Moisturizing Cream  Cetaphil Moisturizing Lotion  Clairol Herbal Essence Moisturizing Lotion, Dry Skin  Clairol Herbal Essence Moisturizing Lotion, Extra Dry Skin  Clairol Herbal Essence Moisturizing Lotion, Normal Skin  Curel Age Defying Therapeutic Moisturizing Lotion with Alpha Hydroxy  Curel Extreme Care Body Lotion  Curel Soothing Hands Moisturizing Hand Lotion  Curel Therapeutic Moisturizing Cream, Fragrance-Free  Curel Therapeutic Moisturizing Lotion, Fragrance-Free  Curel Therapeutic Moisturizing Lotion, Original Formula  Eucerin Daily Replenishing Lotion  Eucerin Dry Skin Therapy Plus Alpha Hydroxy Crme  Eucerin Dry Skin Therapy Plus Alpha Hydroxy Lotion  Eucerin Original Crme  Eucerin Original Lotion  Eucerin Plus Crme  Eucerin Plus Lotion  Eucerin TriLipid Replenishing Lotion  Keri Anti-Bacterial Hand Lotion  Keri Deep Conditioning Original Lotion Dry Skin Formula Softly Scented  Keri Deep Conditioning Original Lotion, Fragrance Free Sensitive Skin Formula  Keri Lotion Fast Absorbing Fragrance Free Sensitive Skin Formula  Keri Lotion Fast Absorbing Softly Scented Dry Skin Formula  Keri Original Lotion  Keri Skin Renewal Lotion Keri Silky Smooth Lotion  Keri Silky Smooth Sensitive Skin Lotion  Nivea Body Creamy Conditioning Oil  Nivea Body Extra Enriched Lotion  Nivea Body Original Lotion  Nivea Body Sheer Moisturizing Lotion Nivea Crme  Nivea Skin Firming Lotion  NutraDerm 30 Skin Lotion  NutraDerm Skin Lotion  NutraDerm Therapeutic Skin Cream  NutraDerm Therapeutic Skin Lotion  ProShield Protective Hand Cream  Provon moisturizing lotion  Please read over the following fact sheets that you were given.

## 2024-03-09 ENCOUNTER — Encounter (HOSPITAL_COMMUNITY)
Admission: RE | Admit: 2024-03-09 | Discharge: 2024-03-09 | Disposition: A | Discharge status: Home / Self Care | Source: Ambulatory Visit | Attending: Orthopedic Surgery | Admitting: Orthopedic Surgery

## 2024-03-09 ENCOUNTER — Other Ambulatory Visit: Payer: Self-pay

## 2024-03-09 ENCOUNTER — Encounter (HOSPITAL_COMMUNITY): Payer: Self-pay

## 2024-03-09 VITALS — BP 142/70 | HR 73 | Temp 98.1°F | Resp 18 | Ht 69.0 in | Wt 219.2 lb

## 2024-03-09 DIAGNOSIS — M4804 Spinal stenosis, thoracic region: Secondary | ICD-10-CM | POA: Diagnosis not present

## 2024-03-09 DIAGNOSIS — I700 Atherosclerosis of aorta: Secondary | ICD-10-CM | POA: Diagnosis not present

## 2024-03-09 DIAGNOSIS — Z7902 Long term (current) use of antithrombotics/antiplatelets: Secondary | ICD-10-CM | POA: Insufficient documentation

## 2024-03-09 DIAGNOSIS — Z01812 Encounter for preprocedural laboratory examination: Secondary | ICD-10-CM | POA: Diagnosis present

## 2024-03-09 DIAGNOSIS — M2578 Osteophyte, vertebrae: Secondary | ICD-10-CM | POA: Diagnosis not present

## 2024-03-09 DIAGNOSIS — F17290 Nicotine dependence, other tobacco product, uncomplicated: Secondary | ICD-10-CM | POA: Diagnosis not present

## 2024-03-09 DIAGNOSIS — Z955 Presence of coronary angioplasty implant and graft: Secondary | ICD-10-CM | POA: Insufficient documentation

## 2024-03-09 DIAGNOSIS — I6523 Occlusion and stenosis of bilateral carotid arteries: Secondary | ICD-10-CM | POA: Insufficient documentation

## 2024-03-09 DIAGNOSIS — M47819 Spondylosis without myelopathy or radiculopathy, site unspecified: Secondary | ICD-10-CM | POA: Diagnosis not present

## 2024-03-09 DIAGNOSIS — N183 Chronic kidney disease, stage 3 unspecified: Secondary | ICD-10-CM | POA: Diagnosis not present

## 2024-03-09 DIAGNOSIS — I4510 Unspecified right bundle-branch block: Secondary | ICD-10-CM | POA: Diagnosis not present

## 2024-03-09 DIAGNOSIS — I2510 Atherosclerotic heart disease of native coronary artery without angina pectoris: Secondary | ICD-10-CM | POA: Insufficient documentation

## 2024-03-09 DIAGNOSIS — I739 Peripheral vascular disease, unspecified: Secondary | ICD-10-CM | POA: Diagnosis not present

## 2024-03-09 DIAGNOSIS — G4733 Obstructive sleep apnea (adult) (pediatric): Secondary | ICD-10-CM | POA: Insufficient documentation

## 2024-03-09 DIAGNOSIS — J42 Unspecified chronic bronchitis: Secondary | ICD-10-CM | POA: Diagnosis not present

## 2024-03-09 DIAGNOSIS — I129 Hypertensive chronic kidney disease with stage 1 through stage 4 chronic kidney disease, or unspecified chronic kidney disease: Secondary | ICD-10-CM | POA: Diagnosis not present

## 2024-03-09 DIAGNOSIS — J439 Emphysema, unspecified: Secondary | ICD-10-CM | POA: Insufficient documentation

## 2024-03-09 DIAGNOSIS — Z7982 Long term (current) use of aspirin: Secondary | ICD-10-CM | POA: Insufficient documentation

## 2024-03-09 DIAGNOSIS — Z01818 Encounter for other preprocedural examination: Secondary | ICD-10-CM

## 2024-03-09 DIAGNOSIS — G9520 Unspecified cord compression: Secondary | ICD-10-CM | POA: Diagnosis not present

## 2024-03-09 HISTORY — DX: Benign neoplasm of major salivary gland, unspecified: D11.9

## 2024-03-09 HISTORY — DX: Personal history of urinary calculi: Z87.442

## 2024-03-09 HISTORY — DX: Peripheral vascular disease, unspecified: I73.9

## 2024-03-09 HISTORY — DX: Unspecified osteoarthritis, unspecified site: M19.90

## 2024-03-09 LAB — CBC
HCT: 43.1 % (ref 39.0–52.0)
Hemoglobin: 14.7 g/dL (ref 13.0–17.0)
MCH: 29.3 pg (ref 26.0–34.0)
MCHC: 34.1 g/dL (ref 30.0–36.0)
MCV: 86 fL (ref 80.0–100.0)
Platelets: 203 10*3/uL (ref 150–400)
RBC: 5.01 MIL/uL (ref 4.22–5.81)
RDW: 12.7 % (ref 11.5–15.5)
WBC: 7.1 10*3/uL (ref 4.0–10.5)
nRBC: 0 % (ref 0.0–0.2)

## 2024-03-09 LAB — BASIC METABOLIC PANEL WITH GFR
Anion gap: 10 (ref 5–15)
BUN: 21 mg/dL (ref 8–23)
CO2: 21 mmol/L — ABNORMAL LOW (ref 22–32)
Calcium: 9.4 mg/dL (ref 8.9–10.3)
Chloride: 103 mmol/L (ref 98–111)
Creatinine, Ser: 1.52 mg/dL — ABNORMAL HIGH (ref 0.61–1.24)
GFR, Estimated: 49 mL/min — ABNORMAL LOW (ref >60)
Glucose, Bld: 102 mg/dL — ABNORMAL HIGH (ref 70–99)
Potassium: 3.7 mmol/L (ref 3.5–5.1)
Sodium: 134 mmol/L — ABNORMAL LOW (ref 135–145)

## 2024-03-09 LAB — TYPE AND SCREEN
ABO/RH(D): A POS
Antibody Screen: NEGATIVE

## 2024-03-09 LAB — SURGICAL PCR SCREEN
MRSA, PCR: NEGATIVE
Staphylococcus aureus: NEGATIVE

## 2024-03-09 NOTE — Progress Notes (Signed)
 PCP - Dr. Eliane Grooms Cardiologist - Dr. Janelle Mediate - last office visit 07/17/2023  PPM/ICD - Denies Device Orders - n/a Rep Notified - n/a  Chest x-ray - n/a EKG - 12/30/2023 Stress Test - 09/10/2022 ECHO - Denies Cardiac Cath - 04/19/2018  Sleep Study - +OSA. Pt wears CPAP nightly. He does not know his pressure settings CPAP - n/a  No DM  Last dose of GLP1 agonist- n/a GLP1 instructions: n/a  Blood Thinner Instructions: Pt instructed to contact surgeon's office for Plavix  instructions Aspirin  Instructions: Pt instructed to contact surgeon's office for ASA instructions  ERAS Protcol - Clear liquids until 0430 morning of surgery PRE-SURGERY Ensure or G2- Ensure given to pt with instructions  COVID TEST- n/a   Anesthesia review: Yes. Cardiac Clearance.   Patient denies shortness of breath, fever, cough and chest pain at PAT appointment. Pt denies any respiratory illness/infection in the last two months.    All instructions explained to the patient, with a verbal understanding of the material. Patient agrees to go over the instructions while at home for a better understanding. Patient also instructed to self quarantine after being tested for COVID-19. The opportunity to ask questions was provided.

## 2024-03-10 ENCOUNTER — Encounter (HOSPITAL_COMMUNITY): Payer: Self-pay

## 2024-03-10 NOTE — Progress Notes (Signed)
 Anesthesia Chart Review:  Case: [id number] Date/Time: 03/23/24 0715   Procedure: ANTERIOR LATERAL LUMBAR FUSION 2 LEVELS (Left) - LEFT-SIDED LUMBAR 2 - LUMBAR 3, LUMBAR 3- LUMBAR 4 LATERAL INTERBODY FUSION WITH INSTRUMENTATION AND ALLOGRAFT   Anesthesia type: General   Diagnosis: Spinal stenosis, unspecified spinal region [M48.00]   Pre-op diagnosis: SPINAL STENOSIS   [facility] / [facility]   Surgeons: Kyle Grimes, MD       DISCUSSION: Patient is a 68 year old male scheduled for the above procedure by Dr. Jackee [name].  He is scheduled for L2-4 anterolateral fusion on 03/23/2024 and L2-S1 decompression and fusion,and leflt L4-S1 TLIF on 03/24/2024.   History includes smoking, HTN, HLD, CAD (DES RCA 05/15/04; DES LCX 10/12/06; CABG: LIMA-LAD, SVG-DIAG, SVG-OM, SVG-PDA 08/18/12; DES LCX 04/19/18), PAF (post-op 2014), PAD (right SFA atherectomy/DCB 02/13/22; left SFA PTA/stent, left popliteal PTA 08/28/22), left CFA pseudoaneurysm (s/p repair and left CFA endarterectomy 02/17/22), carotid artery disease (left ICA CTO, s/p right carotid endarterectomy 09/04/23), CKD (stage 3), cervical spine ankylosis, OSA (uses CPAP), GERD, spinal surgery (L2-4 laminectomy/foraminotomy 12/05/15), left parotid Warthin's tumor, carpal tunnel syndrome (s/p left CTR 12/07/23).   He follows with cardiology for history of CAD s/p PCI RCA 2005, PCI CXx 2007, CABG x4 in 2014, PCI OM 03/2018, post-operative afib (on amiodarone  briefly), HTN, HLD. [name] seen by Dr. Stann [name] on 12/30/2023. He is on lifelong DAPT for CAD with multiple interventions. No angina. He was recently recovered from right CEA. Dr. Stann [name] suspected he may need left FPBG in the future. Non-ischemic Myoview  09/10/22. He was seeing Dr. Jackee [name] and was scheduled for epidural injections at that time and was given permission to temporarily hold ASA and Plavix  7 days prior. [name] at Dr. Wallace [name] office to clarify perioperative DAPT instructions and follow-up with patient.   Six month follow-up planned.  He follows with pulmonology for history of current smoker with associated chronic bronchitis and OSA on CPAP.  [name] seen by Dr. Linder [name] on 08/17/23 and noted to have excellent CPAP compliance. Smoking cessation encouraged. One year follow-up planned.    He follows with vascular surgery for carotid artery stenosis and PAD. S/p atherectomy and DCB to right SFA 01/2022, complicated by left CFA pseudoaneurysm requiring repair. S/p left SFA/popliteal artery stent 08/2022, but occluded by 06/2023 US .  He has CTO left ICA, and is s/p right CEA for > 80% RICA stenosis 08/2023.    Video laryngoscope used for intubation 12/15/2015 and 09/14/2023.  Patient noted to have anterior larynx on intubation 02/17/2022 (1st attempt [name] 2, success with [name] 3).   Preoperative labs reviewed. Creatinine 1.52, consistent with history of CKD. CBC normal. A1c 5.7% on 07/10/23.    VS: BP (!) 142/70   Pulse 73   Temp 36.7 C   Resp 18   Ht 5\' 9"  (1.753 m)   Wt 99.4 kg   SpO2 98%   BMI 32.37 kg/m    PROVIDERS: Kyle Lefort, MD is PCP  Kyle Mediate, MD is cardiologist Kyle Portal, MD is PV cardiologist  Kyle Cocks, MD is vascular surgeon (Carotid) Kyle College, MD is pulmonologist (OSA) Kyle Last, MD is ENT. 11/30/23 office note suggests he was referred to [facility] for consideration of surgery for left Warthin's tumor, with visit planned ~ 04/2024.    LABS: Labs reviewed: Acceptable for surgery. (all labs ordered are listed, but only abnormal results are displayed)  Labs Reviewed  BASIC METABOLIC PANEL WITH GFR - Abnormal; Notable for the  following components:      Result Value   Sodium 134 (*)    CO2 21 (*)    Glucose, Bld 102 (*)    Creatinine, Ser 1.52 (*)    GFR, Estimated 49 (*)    All other components within normal limits  SURGICAL PCR SCREEN  CBC  TYPE AND SCREEN     IMAGES: MRI L-spine 10/29/2023 ([facility]): IMPRESSION:  1. Disc  osteophyte complex and moderate spinal stenosis at T11-12 resulting in mild spinal cord compression.  2.  Moderate spondylosis and facet joint arthrosis resulting in multilevel foraminal stenoses, severe at some levels.   MR Neck 09/28/2023: IMPRESSION: 1. 2.3 cm left parotid mass consistent with known Warthin's tumor. 2. No cervical lymphadenopathy. 3. Left ICA occlusion.  CTA Neck (pre-RIGHT CEA) 07/29/2023: IMPRESSION: 1. 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 A radiographic string sign is present. Minimal attenuated intermittent flow distally with occlusion by the skull base. Distal reconstitution at the left carotid siphon with additional severe stenosis involving the paraclinoid left ICA. 2. 80% atheromatous stenosis about the right carotid bulb. 3. Severe 70% stenosis involving the proximal right V4 segment just distal to the dural reflection. The vertebral arteries are otherwise widely patent within the neck. 4. 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 ENT referral for further workup and evaluation suggested. 5. Few scattered pulmonary nodules measuring up to 6 mm within the visualized upper lungs. The patient has recently undergone a lung cancer screening chest CT on 07/28/2023. Please refer to this exam for follow-up recommendations regarding these findings. - Aortic Atherosclerosis (ICD10-I70.0) and Emphysema (ICD10-J43.9).  CT Chest LCS 07/28/23: IMPRESSION: 1. Lung-RADS 2, benign appearance or behavior. Continue annual screening with low-dose chest CT without contrast in 12 months. 2. Aortic Atherosclerosis (ICD10-I70.0) and Emphysema (ICD10-J43.9).   EKG: 12/30/2023: Normal sinus rhythm Right bundle branch block Cannot rule out Inferior infarct , age undetermined When compared with ECG of 01-May-2021 15:43, Right bundle branch block has replaced  Incomplete right bundle branch block Confirmed by [name] [phone]) on 12/30/2023 8:06:00 AM   CV: Nuclear stress 09/10/2022:   LV perfusion is abnormal. There is no evidence of ischemia. There is evidence of infarction. Defect 1: There is a small defect with moderate reduction in uptake present in the apical inferior location(s) that is fixed. There is normal wall motion in the defect area. Consistent with infarction.   Left ventricular function is normal. Nuclear stress EF: 64 %. The left ventricular ejection fraction is normal (55-65%). End diastolic cavity size is normal.   Findings are consistent with prior myocardial infarction. The study is low risk.    Cardiac cath 04/19/2018: LAD: Ost LAD to Prox LAD lesion is 100% stenosed. LCX: Prox Cx to Mid Cx lesion is 95% stenosed. The lesion was previously treated.  RCA:  Prox RCA to Mid RCA lesion is 75% stenosed. Mid RCA to Dist RCA lesion is 100% stenosed. GRAFT to RPDA GRAFT to Ost 1st DIAG LIMA GRAFT to Mid LAD GRAFT to Mid CX Origin lesion is 100% stenosed.   CONCLUSION: Mid RCA to Dist RCA lesion is 100% stenosed. Ost LAD to Prox LAD lesion is 100% stenosed. Prox RCA to Mid RCA lesion is 75% stenosed. Prox Cx to Mid Cx lesion is 95% stenosed. Origin lesion is 100% stenosed. A stent was successfully placed. Post intervention, there is a 0% residual stenosis. LV end  diastolic pressure is mildly elevated. The left ventricular ejection fraction is 50-55% by visual estimate. The left ventricular systolic function is normal. IMPRESSION: Mr. [name] had an occluded circumflex obtuse marginal branch vein graft at the aorta.  He had high-grade obtuse marginal branch "in-stent restenosis which I intervened on using a synergy drug-eluting stent with an excellent result.     Past Medical History:  Diagnosis Date   Arthritis    Cervical spine ankylosis    CKD (chronic kidney disease) stage 3, GFR 30-59 ml/min (HCC)    COLONIC  POLYPS, HX OF    CORONARY ARTERY DISEASE    a. s/p stent to RCA 2005. b. Stent to Cx 2007. c.  CABG X4 2014; d. LHC 04/19/18 occluded SVG-Circ, DES to in-stent restenosis of circ.    COVID-19 2020   Diverticulosis    DIVERTICULOSIS, COLON    DYSPHAGIA UNSPECIFIED    intermittent   GERD    HEMORRHOIDS    History of kidney stones    HYPERLIPIDEMIA    HYPERTENSION    HYPERTRIGLYCERIDEMIA    Left carotid artery occlusion    OBESITY    Peripheral vascular disease (HCC)    Right Carotid Stenosis s/p carotids endarterectomy   Postoperative atrial fibrillation (HCC) 2014   Pseudoaneurysm of left femoral artery (HCC) 02/16/2022   SLEEP APNEA    on CPAP   Statin intolerance    Tobacco abuse    Vertebral artery occlusion, left    Warthin's tumor    left    Past Surgical History:  Procedure Laterality Date   ABDOMINAL AORTOGRAM W/LOWER EXTREMITY N/A 02/13/2022   Procedure: ABDOMINAL AORTOGRAM W/LOWER EXTREMITY;  Surgeon: Avanell Leigh, MD;  [facility];  Service: Cardiovascular;  Laterality: N/A;   ABDOMINAL AORTOGRAM W/LOWER EXTREMITY N/A 08/28/2022   Procedure: ABDOMINAL AORTOGRAM W/LOWER EXTREMITY;  Surgeon: Young Hensen, MD;  [facility];  Service: Cardiovascular;  Laterality: N/A;   ACHILLES TENDON REPAIR     BACK SURGERY     lower back   CARDIAC CATHETERIZATION     CARPAL TUNNEL RELEASE Left 12/07/2023   Procedure: LEFT CARPAL TUNNEL RELEASE;  Surgeon: Brunilda Capra, MD;  Location: [facility];  Service: Orthopedics;  Laterality: Left;  60 MIN   CATARACT EXTRACTION Bilateral    COLONOSCOPY     CORONARY ARTERY BYPASS GRAFT  08/18/2012   Procedure: CORONARY ARTERY BYPASS GRAFTING (CABG);  Surgeon: Bartley Lightning, MD;  Location: [facility] OR;  Service: Open Heart Surgery;  Laterality: N/A;  CABG x four;  using left internal mammary artery and right leg greater saphenous vein harvested endoscopically   CORONARY STENT INTERVENTION  N/A 04/19/2018   Procedure: CORONARY STENT INTERVENTION;  Surgeon: Avanell Leigh, MD;  [facility];  Service: Cardiovascular;  Laterality: N/A;   ENDARTERECTOMY Right 09/04/2023   Procedure: ENDARTERECTOMY CAROTID;  Surgeon: Margherita Shell, MD;  Location: [facility] OR;  Service: Vascular;  Laterality: Right;   ENDARTERECTOMY FEMORAL Left 02/17/2022   Procedure: ENDARTERECTOMY FEMORAL;  Surgeon: Young Hensen, MD;  Location: [facility] OR;  Service: Vascular;  Laterality: Left;   INTRAOPERATIVE ARTERIOGRAM Left 02/17/2022   Procedure: INTRA OPERATIVE ARTERIOGRAM;  Surgeon: Young Hensen, MD;  Location: [facility] OR;  Service: Vascular;  Laterality: Left;   KNEE ARTHROSCOPY     RIGHT   LEFT HEART CATH AND CORS/GRAFTS ANGIOGRAPHY N/A 04/19/2018   Procedure: LEFT HEART CATH AND CORS/GRAFTS ANGIOGRAPHY;  Surgeon: Katheryne Pane,  Frederico Jan, MD;  [facility];  Service: Cardiovascular;  Laterality: N/A;   LUMBAR LAMINECTOMY/DECOMPRESSION MICRODISCECTOMY N/A 12/05/2015   Procedure: LUMBAR 2-3, LUMBAR 3-4 DECOMPRESSION ;  Surgeon: Kyle Grimes, MD;  [facility];  Service: Orthopedics;  Laterality: N/A;   MASS EXCISION Left 12/07/2023   Procedure: LEFT PALM MASS EXCISION;  Surgeon: Brunilda Capra, MD;  Location: [facility];  Service: Orthopedics;  Laterality: Left;   PATCH ANGIOPLASTY Left 02/17/2022   Procedure: PATCH ANGIOPLASTY Left Common Femoral Artery.;  Surgeon: Young Hensen, MD;  Location: [facility] OR;  Service: Vascular;  Laterality: Left;   PERIPHERAL VASCULAR ATHERECTOMY  02/13/2022   Procedure: PERIPHERAL VASCULAR ATHERECTOMY;  Surgeon: Avanell Leigh, MD;  [facility];  Service: Cardiovascular;;  Rt SFA   PERIPHERAL VASCULAR INTERVENTION Left 08/28/2022   Procedure: PERIPHERAL VASCULAR INTERVENTION;  Surgeon: Young Hensen, MD;  [facility];  Service: Cardiovascular;  Laterality: Left;  SFA/POP   Right  long finger extensor tendon repair     THROMBECTOMY FEMORAL ARTERY Left 02/17/2022   Procedure: Repair of Left COMMON FEMORAL PSEUDOANEURYSM.;  Surgeon: Young Hensen, MD;  [facility];  Service: Vascular;  Laterality: Left;    MEDICATIONS:  acetaminophen  (TYLENOL ) 500 MG tablet   amLODipine  (NORVASC ) 10 MG tablet   aspirin  EC 81 MG tablet   azelastine  (ASTELIN ) 0.1 % nasal spray   Bacillus Coagulans-Inulin (PROBIOTIC-PREBIOTIC PO)   cetirizine  (ZYRTEC ) 10 MG tablet   clopidogrel  (PLAVIX ) 75 MG tablet   losartan -hydrochlorothiazide  (HYZAAR) 100-12.5 MG tablet   methocarbamol  (ROBAXIN ) 500 MG tablet   nitroGLYCERIN  (NITROSTAT ) 0.4 MG SL tablet   pantoprazole  (PROTONIX ) 40 MG tablet   REPATHA  SURECLICK 140 MG/ML SOAJ   No current facility-administered medications for this encounter.    Ella Gun, PA-C Surgical Short Stay/Anesthesiology [facility] Phone [phone] [facility] Phone [phone] 03/10/2024 5:17 PM

## 2024-03-11 ENCOUNTER — Other Ambulatory Visit: Payer: Self-pay | Admitting: Cardiovascular Disease

## 2024-03-11 DIAGNOSIS — N183 Chronic kidney disease, stage 3 unspecified: Secondary | ICD-10-CM

## 2024-03-11 DIAGNOSIS — I10 Essential (primary) hypertension: Secondary | ICD-10-CM

## 2024-03-11 DIAGNOSIS — E782 Mixed hyperlipidemia: Secondary | ICD-10-CM

## 2024-03-11 DIAGNOSIS — I2510 Atherosclerotic heart disease of native coronary artery without angina pectoris: Secondary | ICD-10-CM

## 2024-03-11 NOTE — Anesthesia Preprocedure Evaluation (Addendum)
 Anesthesia Evaluation  Patient identified by MRN, date of birth, ID band Patient awake    Reviewed: Allergy & Precautions, NPO status , Patient's Chart, lab work & pertinent test results  Airway Mallampati: II  TM Distance: >3 FB Neck ROM: Full    Dental no notable dental hx.    Pulmonary sleep apnea and Continuous Positive Airway Pressure Ventilation , Current Smoker and Patient abstained from smoking.   Pulmonary exam normal breath sounds clear to auscultation       Cardiovascular hypertension, Pt. on medications + CAD, + Cardiac Stents, + CABG and + Peripheral Vascular Disease  Normal cardiovascular exam Rhythm:Regular Rate:Normal     Neuro/Psych negative neurological ROS  negative psych ROS   GI/Hepatic Neg liver ROS,GERD  Medicated and Controlled,,  Endo/Other  negative endocrine ROS    Renal/GU Renal InsufficiencyRenal disease     Musculoskeletal  (+) Arthritis ,    Abdominal  (+) + obese  Peds  Hematology  (+) Blood dyscrasia (Plavix )   Anesthesia Other Findings Spinal stenosis, unspecified spinal region  Reproductive/Obstetrics                             Anesthesia Physical Anesthesia Plan  ASA: 3  Anesthesia Plan: General   Post-op Pain Management:    Induction: Intravenous  PONV Risk Score and Plan: Ondansetron , Midazolam  and Treatment may vary due to age or medical condition  Airway Management Planned: Oral ETT  Additional Equipment: ClearSight  Intra-op Plan:   Post-operative Plan: Extubation in OR  Informed Consent: I have reviewed the patients History and Physical, chart, labs and discussed the procedure including the risks, benefits and alternatives for the proposed anesthesia with the patient or authorized representative who has indicated his/her understanding and acceptance.     Dental advisory given  Plan Discussed with: CRNA  Anesthesia Plan  Comments: (PAT note written by Allison Zelenak, PA-C. Potential arterial line placement discussed.  )       Anesthesia Quick Evaluation

## 2024-03-23 ENCOUNTER — Other Ambulatory Visit: Payer: Self-pay

## 2024-03-23 ENCOUNTER — Inpatient Hospital Stay (HOSPITAL_COMMUNITY)

## 2024-03-23 ENCOUNTER — Encounter (HOSPITAL_COMMUNITY): Payer: Self-pay | Admitting: Orthopedic Surgery

## 2024-03-23 ENCOUNTER — Encounter (HOSPITAL_COMMUNITY)
Admission: RE | Disposition: A | Discharge status: Home / Self Care | Payer: Self-pay | Source: Home / Self Care | Attending: Orthopedic Surgery

## 2024-03-23 ENCOUNTER — Inpatient Hospital Stay (HOSPITAL_COMMUNITY): Payer: Self-pay | Admitting: Certified Registered Nurse Anesthetist

## 2024-03-23 ENCOUNTER — Inpatient Hospital Stay (HOSPITAL_COMMUNITY): Payer: Self-pay | Admitting: Vascular Surgery

## 2024-03-23 ENCOUNTER — Inpatient Hospital Stay (HOSPITAL_COMMUNITY)
Admission: RE | Admit: 2024-03-23 | Discharge: 2024-03-25 | DRG: 428 | Disposition: A | Discharge status: Home / Self Care | Attending: Orthopedic Surgery | Admitting: Orthopedic Surgery

## 2024-03-23 DIAGNOSIS — N183 Chronic kidney disease, stage 3 unspecified: Secondary | ICD-10-CM

## 2024-03-23 DIAGNOSIS — Z7982 Long term (current) use of aspirin: Secondary | ICD-10-CM

## 2024-03-23 DIAGNOSIS — M4800 Spinal stenosis, site unspecified: Secondary | ICD-10-CM | POA: Diagnosis present

## 2024-03-23 DIAGNOSIS — Z951 Presence of aortocoronary bypass graft: Secondary | ICD-10-CM

## 2024-03-23 DIAGNOSIS — I4891 Unspecified atrial fibrillation: Secondary | ICD-10-CM | POA: Diagnosis not present

## 2024-03-23 DIAGNOSIS — E781 Pure hyperglyceridemia: Secondary | ICD-10-CM | POA: Diagnosis present

## 2024-03-23 DIAGNOSIS — M4726 Other spondylosis with radiculopathy, lumbar region: Secondary | ICD-10-CM | POA: Diagnosis present

## 2024-03-23 DIAGNOSIS — Z7902 Long term (current) use of antithrombotics/antiplatelets: Secondary | ICD-10-CM

## 2024-03-23 DIAGNOSIS — E669 Obesity, unspecified: Secondary | ICD-10-CM | POA: Diagnosis present

## 2024-03-23 DIAGNOSIS — I2510 Atherosclerotic heart disease of native coronary artery without angina pectoris: Secondary | ICD-10-CM | POA: Diagnosis present

## 2024-03-23 DIAGNOSIS — M4156 Other secondary scoliosis, lumbar region: Secondary | ICD-10-CM | POA: Diagnosis present

## 2024-03-23 DIAGNOSIS — Z6832 Body mass index (BMI) 32.0-32.9, adult: Secondary | ICD-10-CM | POA: Diagnosis not present

## 2024-03-23 DIAGNOSIS — Z8249 Family history of ischemic heart disease and other diseases of the circulatory system: Secondary | ICD-10-CM | POA: Diagnosis not present

## 2024-03-23 DIAGNOSIS — K219 Gastro-esophageal reflux disease without esophagitis: Secondary | ICD-10-CM | POA: Diagnosis present

## 2024-03-23 DIAGNOSIS — I129 Hypertensive chronic kidney disease with stage 1 through stage 4 chronic kidney disease, or unspecified chronic kidney disease: Secondary | ICD-10-CM | POA: Diagnosis present

## 2024-03-23 DIAGNOSIS — Z955 Presence of coronary angioplasty implant and graft: Secondary | ICD-10-CM

## 2024-03-23 DIAGNOSIS — I120 Hypertensive chronic kidney disease with stage 5 chronic kidney disease or end stage renal disease: Secondary | ICD-10-CM

## 2024-03-23 DIAGNOSIS — F17210 Nicotine dependence, cigarettes, uncomplicated: Secondary | ICD-10-CM | POA: Diagnosis present

## 2024-03-23 DIAGNOSIS — M4807 Spinal stenosis, lumbosacral region: Secondary | ICD-10-CM | POA: Diagnosis present

## 2024-03-23 DIAGNOSIS — M48061 Spinal stenosis, lumbar region without neurogenic claudication: Secondary | ICD-10-CM | POA: Diagnosis not present

## 2024-03-23 DIAGNOSIS — Z79899 Other long term (current) drug therapy: Secondary | ICD-10-CM

## 2024-03-23 DIAGNOSIS — G4730 Sleep apnea, unspecified: Secondary | ICD-10-CM | POA: Diagnosis present

## 2024-03-23 DIAGNOSIS — Z8616 Personal history of COVID-19: Secondary | ICD-10-CM | POA: Diagnosis not present

## 2024-03-23 DIAGNOSIS — M5410 Radiculopathy, site unspecified: Principal | ICD-10-CM | POA: Diagnosis present

## 2024-03-23 DIAGNOSIS — I739 Peripheral vascular disease, unspecified: Secondary | ICD-10-CM | POA: Diagnosis present

## 2024-03-23 DIAGNOSIS — G4733 Obstructive sleep apnea (adult) (pediatric): Secondary | ICD-10-CM | POA: Diagnosis not present

## 2024-03-23 HISTORY — PX: ANTERIOR LAT LUMBAR FUSION: SHX1168

## 2024-03-23 SURGERY — ANTERIOR LATERAL LUMBAR FUSION 2 LEVELS
Anesthesia: General | Site: Spine Lumbar | Laterality: Left

## 2024-03-23 MED ORDER — BUPIVACAINE-EPINEPHRINE (PF) 0.25% -1:200000 IJ SOLN
INTRAMUSCULAR | Status: AC
  Filled 2024-03-23: qty 30

## 2024-03-23 MED ORDER — THROMBIN (RECOMBINANT) 20000 UNITS EX SOLR
CUTANEOUS | Status: AC
Start: 2024-03-23 — End: ?
  Filled 2024-03-23: qty 20000

## 2024-03-23 MED ORDER — LOSARTAN POTASSIUM 50 MG PO TABS
100.0000 mg | ORAL_TABLET | Freq: Every day | ORAL | Status: DC
  Administered 2024-03-23 – 2024-03-25 (×2): 100 mg via ORAL
  Filled 2024-03-23 (×2): qty 2

## 2024-03-23 MED ORDER — ONDANSETRON HCL 4 MG/2ML IJ SOLN: 4.0000 mg | Freq: Once | INTRAMUSCULAR | Status: DC | PRN

## 2024-03-23 MED ORDER — FENTANYL CITRATE (PF) 250 MCG/5ML IJ SOLN
INTRAMUSCULAR | Status: AC
Start: 2024-03-23 — End: ?
  Filled 2024-03-23: qty 5

## 2024-03-23 MED ORDER — MENTHOL 3 MG MT LOZG: 1.0000 | LOZENGE | OROMUCOSAL | Status: DC | PRN

## 2024-03-23 MED ORDER — MORPHINE SULFATE (PF) 2 MG/ML IV SOLN: 1.0000 mg | INTRAVENOUS | Status: DC | PRN

## 2024-03-23 MED ORDER — EPHEDRINE 5 MG/ML INJ
INTRAVENOUS | Status: AC
  Filled 2024-03-23: qty 5

## 2024-03-23 MED ORDER — ZOLPIDEM TARTRATE 5 MG PO TABS
5.0000 mg | ORAL_TABLET | Freq: Every evening | ORAL | Status: DC | PRN
  Administered 2024-03-23: 5 mg via ORAL
  Filled 2024-03-23: qty 1

## 2024-03-23 MED ORDER — AMISULPRIDE (ANTIEMETIC) 5 MG/2ML IV SOLN: 10.0000 mg | Freq: Once | INTRAVENOUS | Status: DC | PRN

## 2024-03-23 MED ORDER — SENNOSIDES-DOCUSATE SODIUM 8.6-50 MG PO TABS: 1.0000 | ORAL_TABLET | Freq: Every evening | ORAL | Status: DC | PRN

## 2024-03-23 MED ORDER — HYDROCHLOROTHIAZIDE 12.5 MG PO TABS
12.5000 mg | ORAL_TABLET | Freq: Every day | ORAL | Status: DC
  Administered 2024-03-23 – 2024-03-25 (×2): 12.5 mg via ORAL
  Filled 2024-03-23 (×2): qty 1

## 2024-03-23 MED ORDER — ONDANSETRON HCL 4 MG PO TABS: 4.0000 mg | ORAL_TABLET | Freq: Four times a day (QID) | ORAL | Status: DC | PRN

## 2024-03-23 MED ORDER — MIDAZOLAM HCL 2 MG/2ML IJ SOLN
INTRAMUSCULAR | Status: AC
  Filled 2024-03-23: qty 2

## 2024-03-23 MED ORDER — POVIDONE-IODINE 7.5 % EX SOLN
Freq: Once | CUTANEOUS | Status: DC
  Filled 2024-03-23: qty 118

## 2024-03-23 MED ORDER — ORAL CARE MOUTH RINSE: 15.0000 mL | Freq: Once | OROMUCOSAL | Status: AC

## 2024-03-23 MED ORDER — THROMBIN 20000 UNITS EX SOLR
CUTANEOUS | Status: AC
  Filled 2024-03-23: qty 20000

## 2024-03-23 MED ORDER — PROPOFOL 10 MG/ML IV BOLUS
INTRAVENOUS | Status: AC
  Filled 2024-03-23: qty 20

## 2024-03-23 MED ORDER — AMLODIPINE BESYLATE 10 MG PO TABS
10.0000 mg | ORAL_TABLET | Freq: Every day | ORAL | Status: DC
  Administered 2024-03-23 – 2024-03-24 (×2): 10 mg via ORAL
  Filled 2024-03-23 (×2): qty 1

## 2024-03-23 MED ORDER — PHENYLEPHRINE HCL-NACL 20-0.9 MG/250ML-% IV SOLN
INTRAVENOUS | Status: DC | PRN
  Administered 2024-03-23: 40 ug/min via INTRAVENOUS

## 2024-03-23 MED ORDER — CHLORHEXIDINE GLUCONATE 0.12 % MT SOLN
15.0000 mL | Freq: Once | OROMUCOSAL | Status: AC
  Administered 2024-03-23: 15 mL via OROMUCOSAL
  Filled 2024-03-23: qty 15

## 2024-03-23 MED ORDER — EPHEDRINE 5 MG/ML INJ
INTRAVENOUS | Status: AC
Start: 2024-03-23 — End: ?
  Filled 2024-03-23: qty 5

## 2024-03-23 MED ORDER — SODIUM CHLORIDE 0.9% FLUSH
3.0000 mL | Freq: Two times a day (BID) | INTRAVENOUS | Status: DC
  Administered 2024-03-23 – 2024-03-24 (×2): 3 mL via INTRAVENOUS

## 2024-03-23 MED ORDER — PANTOPRAZOLE SODIUM 40 MG PO TBEC
40.0000 mg | DELAYED_RELEASE_TABLET | Freq: Every day | ORAL | Status: DC
  Administered 2024-03-24 – 2024-03-25 (×2): 40 mg via ORAL
  Filled 2024-03-23 (×2): qty 1

## 2024-03-23 MED ORDER — CEFAZOLIN SODIUM-DEXTROSE 2-4 GM/100ML-% IV SOLN
2.0000 g | Freq: Three times a day (TID) | INTRAVENOUS | Status: AC
  Administered 2024-03-23 (×2): 2 g via INTRAVENOUS
  Filled 2024-03-23 (×2): qty 100

## 2024-03-23 MED ORDER — DEXAMETHASONE SODIUM PHOSPHATE 10 MG/ML IJ SOLN
INTRAMUSCULAR | Status: AC
  Filled 2024-03-23: qty 1

## 2024-03-23 MED ORDER — DOCUSATE SODIUM 100 MG PO CAPS
100.0000 mg | ORAL_CAPSULE | Freq: Two times a day (BID) | ORAL | Status: DC
  Administered 2024-03-23 – 2024-03-25 (×4): 100 mg via ORAL
  Filled 2024-03-23 (×4): qty 1

## 2024-03-23 MED ORDER — ONDANSETRON HCL 4 MG/2ML IJ SOLN
INTRAMUSCULAR | Status: AC
  Filled 2024-03-23: qty 2

## 2024-03-23 MED ORDER — SODIUM CHLORIDE 0.9% FLUSH
3.0000 mL | INTRAVENOUS | Status: DC | PRN
Start: 2024-03-23 — End: 2024-03-25

## 2024-03-23 MED ORDER — PHENOL 1.4 % MT LIQD
1.0000 | OROMUCOSAL | Status: DC | PRN
Start: 2024-03-23 — End: 2024-03-25

## 2024-03-23 MED ORDER — PROPOFOL 1000 MG/100ML IV EMUL
INTRAVENOUS | Status: AC
Start: 2024-03-23 — End: ?
  Filled 2024-03-23: qty 100

## 2024-03-23 MED ORDER — FENTANYL CITRATE (PF) 250 MCG/5ML IJ SOLN
INTRAMUSCULAR | Status: AC
  Filled 2024-03-23: qty 5

## 2024-03-23 MED ORDER — HYDROMORPHONE HCL 2 MG PO TABS
1.0000 mg | ORAL_TABLET | Freq: Four times a day (QID) | ORAL | Status: DC | PRN
  Administered 2024-03-23 – 2024-03-24 (×4): 2 mg via ORAL
  Filled 2024-03-23 (×4): qty 1

## 2024-03-23 MED ORDER — DEXAMETHASONE SODIUM PHOSPHATE 10 MG/ML IJ SOLN
INTRAMUSCULAR | Status: DC | PRN
  Administered 2024-03-23: 10 mg via INTRAVENOUS

## 2024-03-23 MED ORDER — AZELASTINE HCL 0.1 % NA SOLN
2.0000 | Freq: Every day | NASAL | Status: DC
  Filled 2024-03-23: qty 30

## 2024-03-23 MED ORDER — CEFAZOLIN SODIUM-DEXTROSE 2-4 GM/100ML-% IV SOLN
2.0000 g | INTRAVENOUS | Status: AC
  Administered 2024-03-23: 2 g via INTRAVENOUS
  Filled 2024-03-23: qty 100

## 2024-03-23 MED ORDER — LOSARTAN POTASSIUM-HCTZ 100-12.5 MG PO TABS: 1.0000 | ORAL_TABLET | Freq: Every day | ORAL | Status: DC

## 2024-03-23 MED ORDER — FENTANYL CITRATE (PF) 250 MCG/5ML IJ SOLN
INTRAMUSCULAR | Status: DC | PRN
  Administered 2024-03-23: 50 ug via INTRAVENOUS
  Administered 2024-03-23: 150 ug via INTRAVENOUS
  Administered 2024-03-23: 50 ug via INTRAVENOUS
  Administered 2024-03-23: 100 ug via INTRAVENOUS

## 2024-03-23 MED ORDER — ACETAMINOPHEN 10 MG/ML IV SOLN
1000.0000 mg | Freq: Once | INTRAVENOUS | Status: DC | PRN
  Administered 2024-03-23: 1000 mg via INTRAVENOUS

## 2024-03-23 MED ORDER — PROPOFOL 500 MG/50ML IV EMUL
INTRAVENOUS | Status: DC | PRN
Start: 2024-03-23 — End: 2024-03-23
  Administered 2024-03-23: 50 ug/kg/min via INTRAVENOUS
  Administered 2024-03-23: 125 ug/kg/min via INTRAVENOUS

## 2024-03-23 MED ORDER — ONDANSETRON HCL 4 MG/2ML IJ SOLN
INTRAMUSCULAR | Status: DC | PRN
  Administered 2024-03-23: 4 mg via INTRAVENOUS

## 2024-03-23 MED ORDER — ENSURE PRE-SURGERY PO LIQD
296.0000 mL | Freq: Once | ORAL | Status: AC
  Administered 2024-03-24: 296 mL via ORAL
  Filled 2024-03-23: qty 296

## 2024-03-23 MED ORDER — LIDOCAINE 2% (20 MG/ML) 5 ML SYRINGE
INTRAMUSCULAR | Status: AC
  Filled 2024-03-23: qty 5

## 2024-03-23 MED ORDER — ROCURONIUM BROMIDE 10 MG/ML (PF) SYRINGE
PREFILLED_SYRINGE | INTRAVENOUS | Status: AC
  Filled 2024-03-23: qty 10

## 2024-03-23 MED ORDER — FENTANYL CITRATE (PF) 100 MCG/2ML IJ SOLN
INTRAMUSCULAR | Status: AC
Start: 2024-03-23 — End: ?
  Filled 2024-03-23: qty 2

## 2024-03-23 MED ORDER — METHOCARBAMOL 1000 MG/10ML IJ SOLN: 500.0000 mg | Freq: Four times a day (QID) | INTRAMUSCULAR | Status: DC | PRN

## 2024-03-23 MED ORDER — SODIUM CHLORIDE 0.9% FLUSH: 3.0000 mL | Freq: Two times a day (BID) | INTRAVENOUS | Status: DC

## 2024-03-23 MED ORDER — PHENYLEPHRINE 80 MCG/ML (10ML) SYRINGE FOR IV PUSH (FOR BLOOD PRESSURE SUPPORT)
PREFILLED_SYRINGE | INTRAVENOUS | Status: DC | PRN
  Administered 2024-03-23: 40 ug via INTRAVENOUS
  Administered 2024-03-23: 80 ug via INTRAVENOUS
  Administered 2024-03-23: 60 ug via INTRAVENOUS
  Administered 2024-03-23 (×2): 80 ug via INTRAVENOUS
  Administered 2024-03-23: 75 ug via INTRAVENOUS

## 2024-03-23 MED ORDER — ALUM & MAG HYDROXIDE-SIMETH 200-200-20 MG/5ML PO SUSP
30.0000 mL | Freq: Four times a day (QID) | ORAL | Status: DC | PRN
Start: 2024-03-23 — End: 2024-03-25

## 2024-03-23 MED ORDER — LACTATED RINGERS IV SOLN: INTRAVENOUS | Status: DC

## 2024-03-23 MED ORDER — ALBUMIN HUMAN 5 % IV SOLN
INTRAVENOUS | Status: DC | PRN
Start: 2024-03-23 — End: 2024-03-23

## 2024-03-23 MED ORDER — ONDANSETRON HCL 4 MG/2ML IJ SOLN: 4.0000 mg | Freq: Four times a day (QID) | INTRAMUSCULAR | Status: DC | PRN

## 2024-03-23 MED ORDER — BUPIVACAINE-EPINEPHRINE 0.25% -1:200000 IJ SOLN
INTRAMUSCULAR | Status: DC | PRN
  Administered 2024-03-23: 8 mL

## 2024-03-23 MED ORDER — ROCURONIUM BROMIDE 10 MG/ML (PF) SYRINGE: PREFILLED_SYRINGE | INTRAVENOUS | Status: DC | PRN

## 2024-03-23 MED ORDER — FENTANYL CITRATE (PF) 100 MCG/2ML IJ SOLN
25.0000 ug | INTRAMUSCULAR | Status: DC | PRN
  Administered 2024-03-23: 50 ug via INTRAVENOUS

## 2024-03-23 MED ORDER — PROPOFOL 10 MG/ML IV BOLUS
INTRAVENOUS | Status: DC | PRN
  Administered 2024-03-23: 30 mg via INTRAVENOUS
  Administered 2024-03-23: 200 mg via INTRAVENOUS

## 2024-03-23 MED ORDER — ACETAMINOPHEN 650 MG RE SUPP: 650.0000 mg | RECTAL | Status: DC | PRN

## 2024-03-23 MED ORDER — THROMBIN 20000 UNITS EX SOLR
CUTANEOUS | Status: DC | PRN
  Administered 2024-03-23: 20 mL via TOPICAL

## 2024-03-23 MED ORDER — SODIUM CHLORIDE 0.9 % IV SOLN: 250.0000 mL | INTRAVENOUS | Status: AC

## 2024-03-23 MED ORDER — SUCCINYLCHOLINE CHLORIDE 200 MG/10ML IV SOSY
PREFILLED_SYRINGE | INTRAVENOUS | Status: AC
Start: 2024-03-23 — End: ?
  Filled 2024-03-23: qty 10

## 2024-03-23 MED ORDER — LIDOCAINE 2% (20 MG/ML) 5 ML SYRINGE
INTRAMUSCULAR | Status: DC | PRN
  Administered 2024-03-23: 60 mg via INTRAVENOUS

## 2024-03-23 MED ORDER — ACETAMINOPHEN 10 MG/ML IV SOLN
INTRAVENOUS | Status: AC
Start: 2024-03-23 — End: ?
  Filled 2024-03-23: qty 100

## 2024-03-23 MED ORDER — ACETAMINOPHEN 325 MG PO TABS: 650.0000 mg | ORAL_TABLET | ORAL | Status: DC | PRN

## 2024-03-23 MED ORDER — SODIUM CHLORIDE 0.9% FLUSH: 3.0000 mL | INTRAVENOUS | Status: DC | PRN

## 2024-03-23 MED ORDER — SUCCINYLCHOLINE CHLORIDE 200 MG/10ML IV SOSY
PREFILLED_SYRINGE | INTRAVENOUS | Status: DC | PRN
  Administered 2024-03-23: 100 mg via INTRAVENOUS

## 2024-03-23 MED ORDER — BISACODYL 5 MG PO TBEC
5.0000 mg | DELAYED_RELEASE_TABLET | Freq: Every day | ORAL | Status: DC | PRN
  Administered 2024-03-24: 5 mg via ORAL
  Filled 2024-03-23: qty 1

## 2024-03-23 MED ORDER — METHOCARBAMOL 500 MG PO TABS
500.0000 mg | ORAL_TABLET | Freq: Four times a day (QID) | ORAL | Status: DC | PRN
  Administered 2024-03-23 – 2024-03-25 (×5): 500 mg via ORAL
  Filled 2024-03-23 (×5): qty 1

## 2024-03-23 MED ORDER — HYDROCODONE-ACETAMINOPHEN 5-325 MG PO TABS: 1.0000 | ORAL_TABLET | ORAL | Status: DC | PRN

## 2024-03-23 MED ORDER — PHENYLEPHRINE 80 MCG/ML (10ML) SYRINGE FOR IV PUSH (FOR BLOOD PRESSURE SUPPORT)
PREFILLED_SYRINGE | INTRAVENOUS | Status: AC
  Filled 2024-03-23: qty 10

## 2024-03-23 MED ORDER — FLEET ENEMA RE ENEM
1.0000 | ENEMA | Freq: Once | RECTAL | Status: DC | PRN
Start: 2024-03-23 — End: 2024-03-25

## 2024-03-23 MED ORDER — EPHEDRINE SULFATE-NACL 50-0.9 MG/10ML-% IV SOSY
PREFILLED_SYRINGE | INTRAVENOUS | Status: DC | PRN
  Administered 2024-03-23 (×6): 5 mg via INTRAVENOUS

## 2024-03-23 MED ORDER — LORATADINE 10 MG PO TABS
10.0000 mg | ORAL_TABLET | Freq: Every day | ORAL | Status: DC
  Administered 2024-03-23 – 2024-03-25 (×2): 10 mg via ORAL
  Filled 2024-03-23 (×2): qty 1

## 2024-03-23 MED ORDER — MIDAZOLAM HCL 2 MG/2ML IJ SOLN
INTRAMUSCULAR | Status: DC | PRN
  Administered 2024-03-23: 2 mg via INTRAVENOUS

## 2024-03-23 MED ORDER — 0.9 % SODIUM CHLORIDE (POUR BTL) OPTIME
TOPICAL | Status: DC | PRN
  Administered 2024-03-23: 1000 mL

## 2024-03-23 SURGICAL SUPPLY — 61 items
BAG COUNTER SPONGE SURGICOUNT (BAG) ×1 IMPLANT
BENZOIN TINCTURE PRP APPL 2/3 (GAUZE/BANDAGES/DRESSINGS) IMPLANT
BLADE CLIPPER SURG (BLADE) IMPLANT
BLADE SURG 10 STRL SS (BLADE) ×1 IMPLANT
BONE MATRIX VIVIGEN 16.CC/XL (Bone Implant) ×1 IMPLANT
COVER BACK TABLE 80X110 HD (DRAPES) ×1 IMPLANT
COVER SURGICAL LIGHT HANDLE (MISCELLANEOUS) ×1 IMPLANT
DRAPE C-ARM 42X72 X-RAY (DRAPES) ×1 IMPLANT
DRAPE C-ARMOR (DRAPES) IMPLANT
DRAPE POUCH INSTRU U-SHP 10X18 (DRAPES) ×1 IMPLANT
DRAPE SURG 17X23 STRL (DRAPES) ×4 IMPLANT
DURAPREP 26ML APPLICATOR (WOUND CARE) ×1 IMPLANT
ELECT BLADE 6.5 EXT (BLADE) ×1 IMPLANT
ELECT CAUTERY BLADE 6.4 (BLADE) ×1 IMPLANT
ELECT NVM5 SURFACE MEP/EMG (ELECTRODE) IMPLANT
ELECTRODE REM PT RTRN 9FT ADLT (ELECTROSURGICAL) ×1 IMPLANT
GAUZE 4X4 16PLY ~~LOC~~+RFID DBL (SPONGE) ×1 IMPLANT
GAUZE SPONGE 4X4 12PLY STRL (GAUZE/BANDAGES/DRESSINGS) IMPLANT
GLOVE BIO SURGEON STRL SZ 6.5 (GLOVE) ×2 IMPLANT
GLOVE BIO SURGEON STRL SZ8 (GLOVE) ×1 IMPLANT
GLOVE BIOGEL PI IND STRL 7.0 (GLOVE) ×1 IMPLANT
GLOVE BIOGEL PI IND STRL 8 (GLOVE) ×1 IMPLANT
GLOVE SURG ENC MOIS LTX SZ6.5 (GLOVE) ×2 IMPLANT
GOWN STRL REUS W/ TWL LRG LVL3 (GOWN DISPOSABLE) ×1 IMPLANT
GOWN STRL REUS W/ TWL XL LVL3 (GOWN DISPOSABLE) ×2 IMPLANT
GRAFT BNE MATRIX VG XL 16 (Bone Implant) IMPLANT
KIT BASIN OR (CUSTOM PROCEDURE TRAY) ×1 IMPLANT
KIT DILATOR XLIF 5 (KITS) IMPLANT
KIT SURGICAL ACCESS MAXCESS (KITS) IMPLANT
KIT TURNOVER KIT B (KITS) ×1 IMPLANT
MARKER SKIN DUAL TIP RULER LAB (MISCELLANEOUS) ×1 IMPLANT
MODULE EMG NDL SSEP NVM5 (NEUROSURGERY SUPPLIES) IMPLANT
MODULE EMG NEEDLE SSEP NVM5 (NEUROSURGERY SUPPLIES) ×1 IMPLANT
NDL HYPO 25GX1X1/2 BEV (NEEDLE) ×1 IMPLANT
NDL SPNL 18GX3.5 QUINCKE PK (NEEDLE) ×1 IMPLANT
NEEDLE HYPO 25GX1X1/2 BEV (NEEDLE) ×1 IMPLANT
NEEDLE SPNL 18GX3.5 QUINCKE PK (NEEDLE) ×1 IMPLANT
NS IRRIG 1000ML POUR BTL (IV SOLUTION) ×1 IMPLANT
PACK LAMINECTOMY ORTHO (CUSTOM PROCEDURE TRAY) ×1 IMPLANT
PACK UNIVERSAL I (CUSTOM PROCEDURE TRAY) ×1 IMPLANT
PAD ARMBOARD POSITIONER FOAM (MISCELLANEOUS) ×2 IMPLANT
PUTTY DBX 5CC (Putty) IMPLANT
SPACER RISE-L 18X55 7-14MM (Spacer) IMPLANT
SPONGE INTESTINAL PEANUT (DISPOSABLE) ×2 IMPLANT
SPONGE SURGIFOAM ABS GEL 100 (HEMOSTASIS) IMPLANT
SPONGE T-LAP 4X18 ~~LOC~~+RFID (SPONGE) ×1 IMPLANT
STRIP CLOSURE SKIN 1/2X4 (GAUZE/BANDAGES/DRESSINGS) IMPLANT
SURGIFLO W/THROMBIN 8M KIT (HEMOSTASIS) IMPLANT
SUT MNCRL AB 4-0 PS2 18 (SUTURE) ×1 IMPLANT
SUT VIC AB 0 CT1 18XCR BRD 8 (SUTURE) ×1 IMPLANT
SUT VIC AB 1 CT1 18XCR BRD 8 (SUTURE) IMPLANT
SUT VIC AB 2-0 CT2 18 VCP726D (SUTURE) ×1 IMPLANT
SYR 10ML LL (SYRINGE) IMPLANT
SYR BULB IRRIG 60ML STRL (SYRINGE) ×1 IMPLANT
SYR CONTROL 10ML LL (SYRINGE) ×1 IMPLANT
TAPE CLOTH SURG 4X10 WHT LF (GAUZE/BANDAGES/DRESSINGS) IMPLANT
TOWEL GREEN STERILE (TOWEL DISPOSABLE) ×1 IMPLANT
TOWEL GREEN STERILE FF (TOWEL DISPOSABLE) ×1 IMPLANT
TRAY FOLEY MTR SLVR 16FR STAT (SET/KITS/TRAYS/PACK) ×1 IMPLANT
WATER STERILE IRR 1000ML POUR (IV SOLUTION) ×1 IMPLANT
YANKAUER SUCT BULB TIP NO VENT (SUCTIONS) ×1 IMPLANT

## 2024-03-23 NOTE — Transfer of Care (Signed)
 Immediate Anesthesia Transfer of Care Note  Patient: [name]  Procedure(s) Performed: LEFT-SIDED LUMBAR TWO - LUMBAR THREE, LUMBAR THREE - LUMBAR FOUR LATERAL INTERBODY FUSION WITH INSTRUMENTATION AND ALLOGRAFT (Left: Spine Lumbar)  Patient Location: PACU  Anesthesia Type:General  Level of Consciousness: drowsy, patient cooperative, and responds to stimulation  Airway & Oxygen Therapy: Patient Spontanous Breathing and Patient connected to face mask oxygen  Post-op Assessment: Report given to RN, Post -op Vital signs reviewed and stable, and Patient moving all extremities X 4  Post vital signs: Reviewed and stable  Last Vitals:  Vitals Value Taken Time  BP    Temp    Pulse 92 03/23/24 1108  Resp 20 03/23/24 1108  SpO2 96 % 03/23/24 1108  Vitals shown include unfiled device data.  Last Pain:  Vitals:   03/23/24 0611  TempSrc:   PainSc: 7          Complications: No notable events documented.

## 2024-03-23 NOTE — H&P (Signed)
 PREOPERATIVE H&P  Chief Complaint: Bilateral leg pain  HPI: [name] is a 68 y.o. male who presents with ongoing pain in the bilateral legs  MRI reveals spinal stenosis spanning L2-S1 with a degenerative scoliosis s/p a previosu lumbar decompression years ago  Patient has failed multiple forms of conservative care and continues to have pain (see office notes for additional details regarding the patient's full course of treatment)  Past Medical History:  Diagnosis Date   Arthritis    Cervical spine ankylosis    CKD (chronic kidney disease) stage 3, GFR 30-59 ml/min (HCC)    COLONIC POLYPS, HX OF    CORONARY ARTERY DISEASE    a. s/p stent to RCA 2005. b. Stent to Cx 2007. c.  CABG X4 2014; d. LHC 04/19/18 occluded SVG-Circ, DES to in-stent restenosis of circ.    COVID-19 2020   Diverticulosis    DIVERTICULOSIS, COLON    DYSPHAGIA UNSPECIFIED    intermittent   GERD    HEMORRHOIDS    History of kidney stones    HYPERLIPIDEMIA    HYPERTENSION    HYPERTRIGLYCERIDEMIA    Left carotid artery occlusion    OBESITY    Peripheral vascular disease (HCC)    Right Carotid Stenosis s/p carotids endarterectomy   Postoperative atrial fibrillation (HCC) 2014   Pseudoaneurysm of left femoral artery (HCC) 02/16/2022   SLEEP APNEA    on CPAP   Statin intolerance    Tobacco abuse    Vertebral artery occlusion, left    Warthin's tumor    left   Past Surgical History:  Procedure Laterality Date   ABDOMINAL AORTOGRAM W/LOWER EXTREMITY N/A 02/13/2022   Procedure: ABDOMINAL AORTOGRAM W/LOWER EXTREMITY;  Surgeon: Avanell Leigh, MD;  Location: [facility];  Service: Cardiovascular;  Laterality: N/A;   ABDOMINAL AORTOGRAM W/LOWER EXTREMITY N/A 08/28/2022   Procedure: ABDOMINAL AORTOGRAM W/LOWER EXTREMITY;  Surgeon: Young Hensen, MD;  Location: [facility];  Service: Cardiovascular;  Laterality: N/A;   ACHILLES TENDON REPAIR     BACK SURGERY     lower back    CARDIAC CATHETERIZATION     CARPAL TUNNEL RELEASE Left 12/07/2023   Procedure: LEFT CARPAL TUNNEL RELEASE;  Surgeon: Brunilda Capra, MD;  Location: [facility];  Service: Orthopedics;  Laterality: Left;  60 MIN   CATARACT EXTRACTION Bilateral    COLONOSCOPY     CORONARY ARTERY BYPASS GRAFT  08/18/2012   Procedure: CORONARY ARTERY BYPASS GRAFTING (CABG);  Surgeon: Bartley Lightning, MD;  Location: [facility] OR;  Service: Open Heart Surgery;  Laterality: N/A;  CABG x four;  using left internal mammary artery and right leg greater saphenous vein harvested endoscopically   CORONARY STENT INTERVENTION N/A 04/19/2018   Procedure: CORONARY STENT INTERVENTION;  Surgeon: Avanell Leigh, MD;  Location: [facility];  Service: Cardiovascular;  Laterality: N/A;   ENDARTERECTOMY Right 09/04/2023   Procedure: ENDARTERECTOMY CAROTID;  Surgeon: Margherita Shell, MD;  Location: [facility] OR;  Service: Vascular;  Laterality: Right;   ENDARTERECTOMY FEMORAL Left 02/17/2022   Procedure: ENDARTERECTOMY FEMORAL;  Surgeon: Young Hensen, MD;  Location: [facility] OR;  Service: Vascular;  Laterality: Left;   INTRAOPERATIVE ARTERIOGRAM Left 02/17/2022   Procedure: INTRA OPERATIVE ARTERIOGRAM;  Surgeon: Young Hensen, MD;  Location: [facility] OR;  Service: Vascular;  Laterality: Left;   KNEE ARTHROSCOPY     RIGHT   LEFT HEART CATH AND CORS/GRAFTS ANGIOGRAPHY N/A 04/19/2018  Procedure: LEFT HEART CATH AND CORS/GRAFTS ANGIOGRAPHY;  Surgeon: Avanell Leigh, MD;  Location: [facility];  Service: Cardiovascular;  Laterality: N/A;   LUMBAR LAMINECTOMY/DECOMPRESSION MICRODISCECTOMY N/A 12/05/2015   Procedure: LUMBAR 2-3, LUMBAR 3-4 DECOMPRESSION ;  Surgeon: [name], MD;  Location: [facility];  Service: Orthopedics;  Laterality: N/A;   MASS EXCISION Left 12/07/2023   Procedure: LEFT PALM MASS EXCISION;  Surgeon: Brunilda Capra, MD;  Location: [facility];  Service: Orthopedics;  Laterality: Left;    PATCH ANGIOPLASTY Left 02/17/2022   Procedure: PATCH ANGIOPLASTY Left Common Femoral Artery.;  Surgeon: Young Hensen, MD;  Location: [facility] OR;  Service: Vascular;  Laterality: Left;   PERIPHERAL VASCULAR ATHERECTOMY  02/13/2022   Procedure: PERIPHERAL VASCULAR ATHERECTOMY;  Surgeon: Avanell Leigh, MD;  Location: [facility];  Service: Cardiovascular;;  Rt SFA   PERIPHERAL VASCULAR INTERVENTION Left 08/28/2022   Procedure: PERIPHERAL VASCULAR INTERVENTION;  Surgeon: Young Hensen, MD;  Location: [facility];  Service: Cardiovascular;  Laterality: Left;  SFA/POP   Right long finger extensor tendon repair     THROMBECTOMY FEMORAL ARTERY Left 02/17/2022   Procedure: Repair of Left COMMON FEMORAL PSEUDOANEURYSM.;  Surgeon: Young Hensen, MD;  Location: [facility] OR;  Service: Vascular;  Laterality: Left;   Social History   Socioeconomic History   Marital status: Married    Spouse name: Not on file   Number of children: 3   Years of education: Not on file   Highest education level: Not on file  Occupational History   Not on file  Tobacco Use   Smoking status: Every Day    Current packs/day: 1.00    Average packs/day: 1 pack/day for 43.0 years (43.0 ttl pk-yrs)    Types: Cigarettes    Passive exposure: Current (Wife)   Smokeless tobacco: Never   Tobacco comments:    Pt still smoking 1 PPD 09/02/2022 PAP  Vaping Use   Vaping status: Never Used  Substance and Sexual Activity   Alcohol  use: Not Currently    Comment: pt quit drinking around June 2024   Drug use: No   Sexual activity: Yes  Other Topics Concern   Not on file  Social History Narrative   Not on file   Social Drivers of Health   Financial Resource Strain: Not on file  Food Insecurity: No Food Insecurity (09/04/2023)   Hunger Vital Sign    Worried About Running Out of Food in the Last Year: Never true    Ran Out of Food in the Last Year: Never true  Transportation Needs: No Transportation  Needs (09/04/2023)   PRAPARE - Administrator, Civil Service (Medical): No    Lack of Transportation (Non-Medical): No  Physical Activity: Not on file  Stress: Not on file  Social Connections: Unknown (03/11/2022)   [facility]   Social Network    Social Network: Not on file   Family History  Problem Relation Age of Onset   Cancer Mother    Heart disease Brother    Allergies  Allergen Reactions   Statins Other (See Comments)    Bone and muscle pain   Crestor  [Rosuvastatin ]     myalgias   Flonase  [Fluticasone ]     Nose bleeding    Lipitor [Atorvastatin]     myalgias   Oxycodone  Nausea And Vomiting   Hydrocodone Nausea And Vomiting   Prior to Admission medications  Medication Sig Start Date End Date Taking? Authorizing Provider  acetaminophen  (TYLENOL ) 500 MG tablet Take 1,000 mg by mouth every 6 (six) hours as needed for mild pain.   Yes [provider]  amLODipine  (NORVASC ) 10 MG tablet Take 1 tablet (10 mg total) by mouth daily. Patient taking differently: Take 10 mg by mouth at bedtime. 07/17/23  Yes Loyde Rule, MD  aspirin  EC 81 MG tablet Take 1 tablet (81 mg total) by mouth daily. 03/26/17  Yes Onetha Bile, NP  azelastine  (ASTELIN ) 0.1 % nasal spray Place 2 sprays into both nostrils 2 (two) times daily. Use in each nostril as directed Patient taking differently: Place 2 sprays into both nostrils daily. Use in each nostril as directed 01/08/24  Yes Soldatova, Liuba, MD  Bacillus Coagulans-Inulin (PROBIOTIC-PREBIOTIC PO) Take 1 capsule by mouth daily.   Yes [provider]  cetirizine  (ZYRTEC ) 10 MG tablet Take 1 tablet (10 mg total) by mouth daily. Patient taking differently: Take 10 mg by mouth at bedtime. 08/12/23  Yes Soldatova, Liuba, MD  clopidogrel  (PLAVIX ) 75 MG tablet TAKE ONE TABLET (75 MG TOTAL) BY MOUTH DAILY. 02/26/24  Yes Loyde Rule, MD  losartan -hydrochlorothiazide  (HYZAAR) 100-12.5 MG tablet  Take 1 tablet by mouth daily. 01/01/23  Yes Conte, Tessa N, PA-C  methocarbamol  (ROBAXIN ) 500 MG tablet Take 500 mg by mouth every 8 (eight) hours as needed for muscle spasms.   Yes [provider]  nitroGLYCERIN  (NITROSTAT ) 0.4 MG SL tablet Place 1 tablet (0.4 mg total) under the tongue every 5 (five) minutes as needed for chest pain. 01/01/23 03/07/24 Yes Conte, Tessa N, PA-C  pantoprazole  (PROTONIX ) 40 MG tablet TAKE ONE TABLET (40 MG TOTAL) BY MOUTH DAILY. 03/11/24  Yes Loyde Rule, MD  REPATHA  SURECLICK 140 MG/ML SOAJ INJECT ONE ML INTO THE SKIN EVERY 14 DAYS. 04/20/23  Yes Loyde Rule, MD     All other systems have been reviewed and were otherwise negative with the exception of those mentioned in the HPI and as above.  Physical Exam: Vitals:   03/23/24 0545  BP: 126/69  Pulse: 70  Resp: 18  Temp: 97.9 F (36.6 C)  SpO2: 95%    Body mass index is 32.36 kg/m.  General: Alert, no acute distress Cardiovascular: No pedal edema Respiratory: No cyanosis, no use of accessory musculature Skin: No lesions in the area of chief complaint Neurologic: Sensation intact distally Psychiatric: Patient is competent for consent with normal mood and affect Lymphatic: No axillary or cervical lymphadenopathy  Assessment/Plan: SPINAL STENOSIS, DEGENERATIVE SCOLIOSIS, LUMBAR RADICULOPATHY Plan for Procedure(s): ANTERIOR LATERAL LUMBAR FUSION, L2/3, 3/4   Murel Arlington, MD 03/23/2024 6:59 AM

## 2024-03-23 NOTE — Anesthesia Postprocedure Evaluation (Signed)
 Anesthesia Post Note  Patient: Lynae Sandifer  Procedure(s) Performed: LEFT-SIDED LUMBAR TWO - LUMBAR THREE, LUMBAR THREE - LUMBAR FOUR LATERAL INTERBODY FUSION WITH INSTRUMENTATION AND ALLOGRAFT (Left: Spine Lumbar)     Patient location during evaluation: PACU Anesthesia Type: General Level of consciousness: awake Pain management: pain level controlled Vital Signs Assessment: post-procedure vital signs reviewed and stable Respiratory status: spontaneous breathing, nonlabored ventilation and respiratory function stable Cardiovascular status: blood pressure returned to baseline and stable Postop Assessment: no apparent nausea or vomiting Anesthetic complications: no   No notable events documented.  Last Vitals:  Vitals:   03/23/24 1339 03/23/24 1549  BP: (!) 144/75 (!) 116/49  Pulse: 81 82  Resp: 18 18  Temp: 36.5 C (!) 36.4 C  SpO2: 96% 98%    Last Pain:  Vitals:   03/23/24 1610  TempSrc:   PainSc: 5    Pain Goal: Patients Stated Pain Goal: 3 (03/23/24 1520)                 Husam Hohn P Sufyan Meidinger

## 2024-03-23 NOTE — Progress Notes (Signed)
Patient placed himself on home CPAP machine for the night  

## 2024-03-23 NOTE — Anesthesia Preprocedure Evaluation (Addendum)
 Anesthesia Evaluation  Patient identified by MRN, date of birth, ID band Patient awake    Reviewed: Allergy & Precautions, NPO status , Patient's Chart, lab work & pertinent test results  Airway Mallampati: II  TM Distance: >3 FB Neck ROM: Full    Dental  (+) Teeth Intact, Dental Advisory Given, Caps,    Pulmonary sleep apnea and Continuous Positive Airway Pressure Ventilation , Current Smoker   Pulmonary exam normal breath sounds clear to auscultation       Cardiovascular hypertension, Pt. on medications + CAD, + Cardiac Stents (RCA; SVG-Circ), + CABG and + Peripheral Vascular Disease (s/p R CEA)  Normal cardiovascular exam+ dysrhythmias Atrial Fibrillation  Rhythm:Regular Rate:Normal     Neuro/Psych Spinal stenosis  Neuromuscular disease    GI/Hepatic Neg liver ROS,GERD  Medicated,,  Endo/Other  Obesity   Renal/GU Renal InsufficiencyRenal disease     Musculoskeletal  (+) Arthritis ,    Abdominal   Peds  Hematology  (+) Blood dyscrasia (Plavix )   Anesthesia Other Findings   Reproductive/Obstetrics                             Anesthesia Physical Anesthesia Plan  ASA: 3  Anesthesia Plan: General   Post-op Pain Management: Tylenol  PO (pre-op)* and Ketamine IV*   Induction: Intravenous  PONV Risk Score and Plan: 2 and Midazolam , Dexamethasone  and Ondansetron   Airway Management Planned: Oral ETT and Video Laryngoscope Planned  Additional Equipment: Arterial line  Intra-op Plan:   Post-operative Plan: Possible Post-op intubation/ventilation  Informed Consent: I have reviewed the patients History and Physical, chart, labs and discussed the procedure including the risks, benefits and alternatives for the proposed anesthesia with the patient or authorized representative who has indicated his/her understanding and acceptance.     Dental advisory given  Plan Discussed with:  CRNA  Anesthesia Plan Comments: (2nd large bore PIV)        Anesthesia Quick Evaluation

## 2024-03-23 NOTE — Anesthesia Procedure Notes (Signed)
 Procedure Name: Intubation Date/Time: 03/23/2024 7:47 AM  Performed by: Dawna Etienne, CRNAPre-anesthesia Checklist: Patient identified, Patient being monitored, Timeout performed, Emergency Drugs available and Suction available Patient Re-evaluated:Patient Re-evaluated prior to induction Oxygen Delivery Method: Circle system utilized Preoxygenation: Pre-oxygenation with 100% oxygen Induction Type: IV induction Ventilation: Mask ventilation without difficulty and Oral airway inserted - appropriate to patient size Laryngoscope Size: Mac, 4 and Glidescope Grade View: Grade I Tube type: Oral Tube size: 7.5 mm Number of attempts: 2 Airway Equipment and Method: Video-laryngoscopy and Rigid stylet Placement Confirmation: ETT inserted through vocal cords under direct vision, positive ETCO2 and breath sounds checked- equal and bilateral Secured at: 22 cm Tube secured with: Tape Dental Injury: Teeth and Oropharynx as per pre-operative assessment  Difficulty Due To: Difficult Airway- due to large tongue and Difficult Airway- due to anterior larynx Comments: DL with mill 3, barely visualized VC, unable to insert ETT, Glide Mac 4 successful on 1st attempt

## 2024-03-23 NOTE — Op Note (Signed)
 PATIENT NAME: [name]   MEDICAL RECORD NO.:   [id number]   DATE OF BIRTH: [birth year]   DATE OF PROCEDURE: 03/23/2024                               OPERATIVE REPORT     PREOPERATIVE DIAGNOSES: 1. Bilateral lumbar radiculopathy. 2. Spinal stenosis spanning L2-S1 3.  Lumbar degenerative scoliosis 4.  Status post previous lumbar decompressions involving L2-3, L3-4, and L5-S1    POSTOPERATIVE DIAGNOSES: 1. Bilateral lumbar radiculopathy. 2. Spinal stenosis spanning L2-S1 3.  Lumbar degenerative scoliosis 4.  Status post previous lumbar decompressions involving L2-3, L3-4, and L5-S1    PROCEDURE: 1. Direct lateral interbody fusion, via a left-sided approach, L2/3, L3/4 2. Insertion of interbody device x 2 (Globus expandable intervertebral spacers     x 3). 3. Intraoperative use of fluoroscopy. 4.  Use of morselized allograft - Vivigen   SURGEON:  [name], MD.   ASSISTANTGeraline Knapp, PA-C.   ANESTHESIA:  General endotracheal anesthesia.   COMPLICATIONS:  None.   DISPOSITION:  Stable.   ESTIMATED BLOOD LOSS:  Minimal.   INDICATIONS FOR SURGERY:  Briefly, [name] is a pleasant 68 year old male who did present to me in the office with ongoing progressive bilateral leg pain.  He is status post previous decompressions on 2 occasions.  1 involving L5-S1, and subsequent to this, L2-3 and L3-4.  More recently, he has been having increasing pain in his bilateral legs.  His lumbar MRI is notable for a lumbar degenerative scoliosis, and multilevel stenosis spanning L2-S1.  Given his ongoing debilitating symptoms, we did discuss proceeding with a two-stage procedure, specifically, a lateral interbody fusion at L2-3 and L3-4, followed by a decompression the following day, spanning L2-S1, with a posterior fusion at L2-3 and L3-4, and a transforaminal and posterior fusion at L3-4 L4-5.  After full understanding of the risks and recovery period associated with surgery, the  patient did elect to proceed.   OPERATIVE DETAILS:  On 03/23/2024, the patient was brought to surgery and general endotracheal anesthesia was administered.  The patient was placed in the lateral decubitus position with the left side up.  The patient's hips and knees were flexed and she was secured to the bed. His left arm was placed on an arm board.  All bony prominences were meticulously padded.  The bed was appropriately angled in order to ensure a perfect AP and lateral trajectory during surgery.  The patient's left flank was then prepped and draped in the usual sterile fashion.  A time-out procedure was performed.   At this point, a left-sided transverse incision was made between the L2-3 and L3-4 intervertebral disc spaces. The external and internal oblique musculature was dissected, and the transversalis fascia was entered, and the retroperitoneal space was noted.  The peritoneum was bluntly swept anteriorly.  I then was able to identify the psoas muscle.  I did use the initial dilator, which was advanced through the psoas muscle, toward the L3-4 intervertebral disc space, liberally using neurologic monitoring.  There were no neurologic structures in the immediate vicinity of the dilator.  I then sequentially dilated and placed a self-retaining retractor, which was secured to the bed using a rigid arm.  The retractor was slightly opened.  Using triggered EMG, it was clear that there were no neurologic structures in the immediate vicinity of the intervertebral disk space.  At this point,  I performed an annulotomy at L3-4, and I did release the contralateral annulus.  I then performed a thorough and complete L3-4 intervertebral diskectomy.   I then advanced an expandable trial across the L3-4 intervertebral disc space, after which point an expandable titanium intervertebral spacer was packed with allograft and expanded to approximately 10 mm in height.  I was very pleased with the  restoration of intervertebral disc height.  The self-retaining retractor was then removed.   Then, still in the retroperitoneal space, I did dissect cephalad and did enter the psoas muscle in the vicinity of the L2-3 intervertebral disc space.  I did use the initial dilator, which was advanced through the psoas muscle, toward the L2-3 intervertebral disc space, liberally using neurologic monitoring.  There were no neurologic structures in the immediate vicinity of the dilator.  I then sequentially dilated and placed a self-retaining retractor, which was secured to the bed using a rigid arm.  The retractor was slightly opened.  Again, using triggered EMG, it was clear that there were no neurologic structures in the immediate vicinity of the intervertebral disk space.  At this point, I performed an annulotomy at L2-3, and I did release the contralateral annulus.  I then performed a thorough and complete L2-3 intervertebral diskectomy.   I then advanced an expandable trial across the L2-3 intervertebral disc space, after which point an expandable titanium intervertebral spacer was packed with allograft and expanded to approximately 10 mm in height.  I was very pleased with the restoration of intervertebral disc height.  The self-retaining retractor was then removed.    I was very pleased with the final AP and lateral fluoroscopic images.  The wound was then irrigated and closed, using #1 Vicryl, followed by 2-0 Vicryl, followed by 4-0 Monocryl.  A sterile dressing was applied and the patient was awoken from general endotracheal anesthesia and transferred to recovery in stable condition.   Of note, Geraline Knapp, was my assistant throughout surgery, and did aid in retraction, suctioning, and closure from start to finish.     [name], MD

## 2024-03-24 ENCOUNTER — Inpatient Hospital Stay (HOSPITAL_COMMUNITY)

## 2024-03-24 ENCOUNTER — Inpatient Hospital Stay (HOSPITAL_COMMUNITY)
Admission: RE | Disposition: A | Discharge status: Home / Self Care | Payer: Self-pay | Source: Home / Self Care | Attending: Orthopedic Surgery

## 2024-03-24 ENCOUNTER — Inpatient Hospital Stay (HOSPITAL_COMMUNITY): Admission: RE | Admit: 2024-03-24 | Source: Home / Self Care | Admitting: Orthopedic Surgery

## 2024-03-24 ENCOUNTER — Encounter (HOSPITAL_COMMUNITY): Payer: Self-pay | Admitting: Orthopedic Surgery

## 2024-03-24 ENCOUNTER — Inpatient Hospital Stay (HOSPITAL_COMMUNITY): Payer: Self-pay | Admitting: Anesthesiology

## 2024-03-24 DIAGNOSIS — I2510 Atherosclerotic heart disease of native coronary artery without angina pectoris: Secondary | ICD-10-CM

## 2024-03-24 DIAGNOSIS — G4733 Obstructive sleep apnea (adult) (pediatric): Secondary | ICD-10-CM

## 2024-03-24 DIAGNOSIS — M48061 Spinal stenosis, lumbar region without neurogenic claudication: Secondary | ICD-10-CM

## 2024-03-24 DIAGNOSIS — I4891 Unspecified atrial fibrillation: Secondary | ICD-10-CM

## 2024-03-24 HISTORY — PX: TRANSFORAMINAL LUMBAR INTERBODY FUSION (TLIF) WITH PEDICLE SCREW FIXATION 2 LEVEL: SHX6142

## 2024-03-24 HISTORY — PX: DECOMPRESSIVE LUMBAR LAMINECTOMY LEVEL 4: SHX5794

## 2024-03-24 SURGERY — TRANSFORAMINAL LUMBAR INTERBODY FUSION (TLIF) WITH PEDICLE SCREW FIXATION 2 LEVEL
Anesthesia: General

## 2024-03-24 MED ORDER — ACETAMINOPHEN 500 MG PO TABS
1000.0000 mg | ORAL_TABLET | Freq: Once | ORAL | Status: AC
  Administered 2024-03-24: 1000 mg via ORAL
  Filled 2024-03-24: qty 2

## 2024-03-24 MED ORDER — FENTANYL CITRATE (PF) 100 MCG/2ML IJ SOLN: 25.0000 ug | INTRAMUSCULAR | Status: DC | PRN

## 2024-03-24 MED ORDER — CEFAZOLIN SODIUM-DEXTROSE 2-4 GM/100ML-% IV SOLN
INTRAVENOUS | Status: AC
  Filled 2024-03-24: qty 100

## 2024-03-24 MED ORDER — BUPIVACAINE LIPOSOME 1.3 % IJ SUSP
INTRAMUSCULAR | Status: DC | PRN
Start: 2024-03-24 — End: 2024-03-24
  Administered 2024-03-24: 20 mL

## 2024-03-24 MED ORDER — SODIUM CHLORIDE 0.9 % IV SOLN: INTRAVENOUS | Status: DC | PRN

## 2024-03-24 MED ORDER — POVIDONE-IODINE 7.5 % EX SOLN
Freq: Once | CUTANEOUS | Status: DC
  Filled 2024-03-24: qty 118

## 2024-03-24 MED ORDER — VANCOMYCIN HCL 1000 MG IV SOLR
INTRAVENOUS | Status: DC | PRN
  Administered 2024-03-24: 1000 mg

## 2024-03-24 MED ORDER — LIDOCAINE 2% (20 MG/ML) 5 ML SYRINGE
INTRAMUSCULAR | Status: DC | PRN
  Administered 2024-03-24: 100 mg via INTRAVENOUS

## 2024-03-24 MED ORDER — CHLORHEXIDINE GLUCONATE 0.12 % MT SOLN
OROMUCOSAL | Status: AC
  Administered 2024-03-24: 15 mL via OROMUCOSAL
  Filled 2024-03-24: qty 15

## 2024-03-24 MED ORDER — MIDAZOLAM HCL 2 MG/2ML IJ SOLN
INTRAMUSCULAR | Status: AC
  Filled 2024-03-24: qty 2

## 2024-03-24 MED ORDER — ACETAMINOPHEN 10 MG/ML IV SOLN
INTRAVENOUS | Status: AC
  Filled 2024-03-24: qty 100

## 2024-03-24 MED ORDER — ONDANSETRON HCL 4 MG/2ML IJ SOLN
INTRAMUSCULAR | Status: AC
  Filled 2024-03-24: qty 2

## 2024-03-24 MED ORDER — KETAMINE HCL 50 MG/5ML IJ SOSY
PREFILLED_SYRINGE | INTRAMUSCULAR | Status: DC | PRN
  Administered 2024-03-24: 10 mg via INTRAVENOUS
  Administered 2024-03-24 (×2): 20 mg via INTRAVENOUS

## 2024-03-24 MED ORDER — BUPIVACAINE-EPINEPHRINE 0.25% -1:200000 IJ SOLN
INTRAMUSCULAR | Status: DC | PRN
  Administered 2024-03-24: 8 mL

## 2024-03-24 MED ORDER — PROPOFOL 10 MG/ML IV BOLUS
INTRAVENOUS | Status: DC | PRN
  Administered 2024-03-24: 170 mg via INTRAVENOUS

## 2024-03-24 MED ORDER — KETAMINE HCL 50 MG/5ML IJ SOSY
PREFILLED_SYRINGE | INTRAMUSCULAR | Status: AC
  Filled 2024-03-24: qty 5

## 2024-03-24 MED ORDER — FENTANYL CITRATE (PF) 250 MCG/5ML IJ SOLN
INTRAMUSCULAR | Status: DC | PRN
  Administered 2024-03-24: 150 ug via INTRAVENOUS
  Administered 2024-03-24 (×2): 50 ug via INTRAVENOUS

## 2024-03-24 MED ORDER — LACTATED RINGERS IV SOLN: INTRAVENOUS | Status: DC

## 2024-03-24 MED ORDER — ALBUMIN HUMAN 5 % IV SOLN: INTRAVENOUS | Status: DC | PRN

## 2024-03-24 MED ORDER — PROPOFOL 10 MG/ML IV BOLUS
INTRAVENOUS | Status: AC
  Filled 2024-03-24: qty 20

## 2024-03-24 MED ORDER — HYDROMORPHONE HCL 2 MG PO TABS
1.0000 mg | ORAL_TABLET | ORAL | Status: DC | PRN
  Administered 2024-03-24 – 2024-03-25 (×4): 2 mg via ORAL
  Filled 2024-03-24 (×4): qty 1

## 2024-03-24 MED ORDER — THROMBIN (RECOMBINANT) 20000 UNITS EX SOLR
CUTANEOUS | Status: AC
  Filled 2024-03-24: qty 20000

## 2024-03-24 MED ORDER — 0.9 % SODIUM CHLORIDE (POUR BTL) OPTIME
TOPICAL | Status: DC | PRN
  Administered 2024-03-24 (×6): 1000 mL

## 2024-03-24 MED ORDER — LIDOCAINE 2% (20 MG/ML) 5 ML SYRINGE
INTRAMUSCULAR | Status: AC
  Filled 2024-03-24: qty 5

## 2024-03-24 MED ORDER — EPHEDRINE 5 MG/ML INJ
INTRAVENOUS | Status: AC
  Filled 2024-03-24: qty 5

## 2024-03-24 MED ORDER — MIDAZOLAM HCL 2 MG/2ML IJ SOLN
INTRAMUSCULAR | Status: DC | PRN
  Administered 2024-03-24: 2 mg via INTRAVENOUS

## 2024-03-24 MED ORDER — ORAL CARE MOUTH RINSE: 15.0000 mL | Freq: Once | OROMUCOSAL | Status: AC

## 2024-03-24 MED ORDER — LACTATED RINGERS IV SOLN: INTRAVENOUS | Status: DC | PRN

## 2024-03-24 MED ORDER — THROMBIN 20000 UNITS EX SOLR
CUTANEOUS | Status: DC | PRN
  Administered 2024-03-24: 20000 [IU] via TOPICAL

## 2024-03-24 MED ORDER — DEXAMETHASONE SODIUM PHOSPHATE 10 MG/ML IJ SOLN
INTRAMUSCULAR | Status: DC | PRN
  Administered 2024-03-24: 5 mg via INTRAVENOUS

## 2024-03-24 MED ORDER — BUPIVACAINE LIPOSOME 1.3 % IJ SUSP
INTRAMUSCULAR | Status: AC
Start: 2024-03-24 — End: ?
  Filled 2024-03-24: qty 20

## 2024-03-24 MED ORDER — DEXAMETHASONE SODIUM PHOSPHATE 10 MG/ML IJ SOLN
INTRAMUSCULAR | Status: AC
Start: 2024-03-24 — End: ?
  Filled 2024-03-24: qty 1

## 2024-03-24 MED ORDER — HYDROMORPHONE HCL 1 MG/ML IJ SOLN
INTRAMUSCULAR | Status: DC | PRN
  Administered 2024-03-24: .5 mg via INTRAVENOUS

## 2024-03-24 MED ORDER — CEFAZOLIN SODIUM-DEXTROSE 2-4 GM/100ML-% IV SOLN
2.0000 g | INTRAVENOUS | Status: AC
  Administered 2024-03-24 (×2): 2 g via INTRAVENOUS
  Filled 2024-03-24: qty 100

## 2024-03-24 MED ORDER — ACETAMINOPHEN 10 MG/ML IV SOLN
INTRAVENOUS | Status: DC | PRN
  Administered 2024-03-24: 1000 mg via INTRAVENOUS

## 2024-03-24 MED ORDER — VANCOMYCIN HCL 1000 MG IV SOLR
INTRAVENOUS | Status: AC
  Filled 2024-03-24: qty 20

## 2024-03-24 MED ORDER — CHLORHEXIDINE GLUCONATE 0.12 % MT SOLN: 15.0000 mL | Freq: Once | OROMUCOSAL | Status: AC

## 2024-03-24 MED ORDER — PHENYLEPHRINE HCL-NACL 20-0.9 MG/250ML-% IV SOLN
INTRAVENOUS | Status: DC | PRN
  Administered 2024-03-24: 40 ug/min via INTRAVENOUS

## 2024-03-24 MED ORDER — ONDANSETRON HCL 4 MG/2ML IJ SOLN
INTRAMUSCULAR | Status: DC | PRN
  Administered 2024-03-24: 4 mg via INTRAVENOUS

## 2024-03-24 MED ORDER — ROCURONIUM BROMIDE 10 MG/ML (PF) SYRINGE
PREFILLED_SYRINGE | INTRAVENOUS | Status: AC
  Filled 2024-03-24: qty 10

## 2024-03-24 MED ORDER — EPHEDRINE SULFATE-NACL 50-0.9 MG/10ML-% IV SOSY
PREFILLED_SYRINGE | INTRAVENOUS | Status: DC | PRN
  Administered 2024-03-24 (×5): 5 mg via INTRAVENOUS

## 2024-03-24 MED ORDER — PHENYLEPHRINE 80 MCG/ML (10ML) SYRINGE FOR IV PUSH (FOR BLOOD PRESSURE SUPPORT)
PREFILLED_SYRINGE | INTRAVENOUS | Status: DC | PRN
  Administered 2024-03-24 (×4): 80 ug via INTRAVENOUS
  Administered 2024-03-24: 160 ug via INTRAVENOUS
  Administered 2024-03-24 (×3): 80 ug via INTRAVENOUS

## 2024-03-24 MED ORDER — ONDANSETRON HCL 4 MG/2ML IJ SOLN: 4.0000 mg | Freq: Once | INTRAMUSCULAR | Status: DC | PRN

## 2024-03-24 MED ORDER — PHENYLEPHRINE 80 MCG/ML (10ML) SYRINGE FOR IV PUSH (FOR BLOOD PRESSURE SUPPORT)
PREFILLED_SYRINGE | INTRAVENOUS | Status: AC
  Filled 2024-03-24: qty 10

## 2024-03-24 MED ORDER — ROCURONIUM BROMIDE 10 MG/ML (PF) SYRINGE
PREFILLED_SYRINGE | INTRAVENOUS | Status: AC
Start: 2024-03-24 — End: ?
  Filled 2024-03-24: qty 10

## 2024-03-24 MED ORDER — ROCURONIUM BROMIDE 10 MG/ML (PF) SYRINGE
PREFILLED_SYRINGE | INTRAVENOUS | Status: DC | PRN
  Administered 2024-03-24 (×3): 20 mg via INTRAVENOUS
  Administered 2024-03-24: 80 mg via INTRAVENOUS
  Administered 2024-03-24: 20 mg via INTRAVENOUS

## 2024-03-24 MED ORDER — HYDROMORPHONE HCL 1 MG/ML IJ SOLN
INTRAMUSCULAR | Status: AC
  Filled 2024-03-24: qty 0.5

## 2024-03-24 MED ORDER — BUPIVACAINE-EPINEPHRINE (PF) 0.25% -1:200000 IJ SOLN
INTRAMUSCULAR | Status: AC
  Filled 2024-03-24: qty 30

## 2024-03-24 MED ORDER — SUGAMMADEX SODIUM 200 MG/2ML IV SOLN
INTRAVENOUS | Status: DC | PRN
  Administered 2024-03-24: 200 mg via INTRAVENOUS

## 2024-03-24 MED ORDER — FENTANYL CITRATE (PF) 250 MCG/5ML IJ SOLN
INTRAMUSCULAR | Status: AC
  Filled 2024-03-24: qty 5

## 2024-03-24 MED ORDER — HEMOSTATIC AGENTS (NO CHARGE) OPTIME
TOPICAL | Status: DC | PRN
  Administered 2024-03-24: 1 via TOPICAL

## 2024-03-24 SURGICAL SUPPLY — 82 items
BAG COUNTER SPONGE SURGICOUNT (BAG) ×2 IMPLANT
BENZOIN TINCTURE PRP APPL 2/3 (GAUZE/BANDAGES/DRESSINGS) ×2 IMPLANT
BLADE CLIPPER SURG (BLADE) IMPLANT
BUR PRESCISION 1.7 ELITE (BURR) ×2 IMPLANT
BUR ROUND FLUTED 5 RND (BURR) ×2 IMPLANT
BUR ROUND PRECISION 4.0 (BURR) IMPLANT
BUR SABER RD CUTTING 3.0 (BURR) IMPLANT
CAGE SABLE 10X26 6-12 0D (Cage) IMPLANT
CAGE SABLE 10X26 6-12 8D (Cage) IMPLANT
CANNULA GRAFT BNE VG PRE-FILL (Bone Implant) IMPLANT
CNTNR URN SCR LID CUP LEK RST (MISCELLANEOUS) ×2 IMPLANT
COVER MAYO STAND STRL (DRAPES) ×4 IMPLANT
COVER SURGICAL LIGHT HANDLE (MISCELLANEOUS) ×2 IMPLANT
DISPENSER GRAFT BNE VG (MISCELLANEOUS) IMPLANT
DISPENSER VIVIGEN BONE GRAFT (MISCELLANEOUS) ×2 IMPLANT
DRAPE C-ARM 42X72 X-RAY (DRAPES) ×2 IMPLANT
DRAPE C-ARMOR (DRAPES) IMPLANT
DRAPE POUCH INSTRU U-SHP 10X18 (DRAPES) ×2 IMPLANT
DRAPE SURG 17X23 STRL (DRAPES) ×8 IMPLANT
DURAPREP 26ML APPLICATOR (WOUND CARE) ×2 IMPLANT
ELECT CAUTERY BLADE 6.4 (BLADE) ×2 IMPLANT
ELECTRODE BLDE 4.0 EZ CLN MEGD (MISCELLANEOUS) ×2 IMPLANT
ELECTRODE REM PT RTRN 9FT ADLT (ELECTROSURGICAL) ×2 IMPLANT
EVACUATOR SILICONE 100CC (DRAIN) IMPLANT
FILTER STRAW FLUID ASPIR (MISCELLANEOUS) ×2 IMPLANT
GAUZE 4X4 16PLY ~~LOC~~+RFID DBL (SPONGE) ×2 IMPLANT
GAUZE SPONGE 4X4 12PLY STRL (GAUZE/BANDAGES/DRESSINGS) ×2 IMPLANT
GLOVE BIO SURGEON STRL SZ 6.5 (GLOVE) ×2 IMPLANT
GLOVE BIO SURGEON STRL SZ8 (GLOVE) ×2 IMPLANT
GLOVE BIOGEL PI IND STRL 7.0 (GLOVE) ×2 IMPLANT
GLOVE BIOGEL PI IND STRL 8 (GLOVE) ×2 IMPLANT
GLOVE SURG ENC MOIS LTX SZ6.5 (GLOVE) ×2 IMPLANT
GOWN STRL REUS W/ TWL LRG LVL3 (GOWN DISPOSABLE) ×4 IMPLANT
GOWN STRL REUS W/ TWL XL LVL3 (GOWN DISPOSABLE) ×2 IMPLANT
GRAFT BONE CANNULA VIVIGEN 3 (Bone Implant) ×8 IMPLANT
IV CATH 14GX2 1/4 (CATHETERS) ×2 IMPLANT
KIT BASIN OR (CUSTOM PROCEDURE TRAY) ×2 IMPLANT
KIT POSITION SURG JACKSON T1 (MISCELLANEOUS) ×2 IMPLANT
KIT TURNOVER KIT B (KITS) ×2 IMPLANT
MARKER SKIN DUAL TIP RULER LAB (MISCELLANEOUS) ×4 IMPLANT
NDL 18GX1X1/2 (RX/OR ONLY) (NEEDLE) ×2 IMPLANT
NDL 22X1.5 STRL (OR ONLY) (MISCELLANEOUS) ×4 IMPLANT
NDL HYPO 25GX1X1/2 BEV (NEEDLE) ×2 IMPLANT
NDL SPNL 18GX3.5 QUINCKE PK (NEEDLE) ×4 IMPLANT
NEEDLE 18GX1X1/2 (RX/OR ONLY) (NEEDLE) ×2 IMPLANT
NEEDLE 22X1.5 STRL (OR ONLY) (MISCELLANEOUS) ×4 IMPLANT
NEEDLE HYPO 25GX1X1/2 BEV (NEEDLE) ×2 IMPLANT
NEEDLE SPNL 18GX3.5 QUINCKE PK (NEEDLE) ×4 IMPLANT
NS IRRIG 1000ML POUR BTL (IV SOLUTION) ×2 IMPLANT
PACK LAMINECTOMY ORTHO (CUSTOM PROCEDURE TRAY) ×2 IMPLANT
PACK UNIVERSAL I (CUSTOM PROCEDURE TRAY) ×2 IMPLANT
PAD ARMBOARD POSITIONER FOAM (MISCELLANEOUS) ×4 IMPLANT
PATTIES SURGICAL .5 X1 (DISPOSABLE) ×2 IMPLANT
PATTIES SURGICAL .5X1.5 (GAUZE/BANDAGES/DRESSINGS) ×2 IMPLANT
PUTTY DBX 10CC (Bone Implant) IMPLANT
ROD PRE-LORD EXP 5.5X120 (Rod) IMPLANT
SCREW CORTICAL VIPER 7X40MM (Screw) IMPLANT
SCREW SET SINGLE INNER (Screw) IMPLANT
SCREW VIPER CORT FIX 6.00X30 (Screw) IMPLANT
SCREW VIPER CORT FIX 6X35 (Screw) IMPLANT
SPONGE INTESTINAL PEANUT (DISPOSABLE) ×2 IMPLANT
SPONGE SURGIFOAM ABS GEL 100 (HEMOSTASIS) ×2 IMPLANT
SPONGE T-LAP 4X18 ~~LOC~~+RFID (SPONGE) IMPLANT
STRIP CLOSURE SKIN 1/2X4 (GAUZE/BANDAGES/DRESSINGS) ×4 IMPLANT
SURGIFLO W/THROMBIN 8M KIT (HEMOSTASIS) IMPLANT
SUT MNCRL AB 4-0 PS2 18 (SUTURE) ×2 IMPLANT
SUT VIC AB 0 CT1 18XCR BRD 8 (SUTURE) ×2 IMPLANT
SUT VIC AB 1 CT1 18XCR BRD 8 (SUTURE) ×2 IMPLANT
SUT VIC AB 2-0 CT2 18 VCP726D (SUTURE) ×2 IMPLANT
SYR 20ML LL LF (SYRINGE) ×4 IMPLANT
SYR BULB IRRIG 60ML STRL (SYRINGE) ×2 IMPLANT
SYR CONTROL 10ML LL (SYRINGE) ×4 IMPLANT
SYR TB 1ML LUER SLIP (SYRINGE) ×2 IMPLANT
TAP CANN VIPER2 DL 5.0 (TAP) IMPLANT
TAP CANN VIPER2 DL 6.0 (TAP) IMPLANT
TAP CANN VIPER2 DL 7.0 (TAP) IMPLANT
TAP VIPER MIS 4.35MM (TAP) IMPLANT
TAPE CLOTH SURG 6X10 NS LF (GAUZE/BANDAGES/DRESSINGS) IMPLANT
TRAY FOLEY MTR SLVR 16FR STAT (SET/KITS/TRAYS/PACK) ×2 IMPLANT
TUBE FUNNEL GL DISP (ORTHOPEDIC DISPOSABLE SUPPLIES) IMPLANT
WATER STERILE IRR 1000ML POUR (IV SOLUTION) ×2 IMPLANT
YANKAUER SUCT BULB TIP NO VENT (SUCTIONS) ×2 IMPLANT

## 2024-03-24 NOTE — Anesthesia Procedure Notes (Signed)
 Procedure Name: Intubation Date/Time: 03/24/2024 7:49 AM  Performed by: Dawna Etienne, CRNAPre-anesthesia Checklist: Patient identified, Patient being monitored, Timeout performed, Emergency Drugs available and Suction available Patient Re-evaluated:Patient Re-evaluated prior to induction Oxygen Delivery Method: Circle system utilized Preoxygenation: Pre-oxygenation with 100% oxygen Induction Type: IV induction Ventilation: Mask ventilation without difficulty and Oral airway inserted - appropriate to patient size Laryngoscope Size: Mac, Glidescope and 4 Grade View: Grade I Tube type: Oral Tube size: 7.5 mm Number of attempts: 1 Airway Equipment and Method: Rigid stylet and Video-laryngoscopy Placement Confirmation: ETT inserted through vocal cords under direct vision, positive ETCO2 and breath sounds checked- equal and bilateral Secured at: 21 cm Tube secured with: Tape Dental Injury: Teeth and Oropharynx as per pre-operative assessment  Difficulty Due To: Difficult Airway- due to anterior larynx and Difficult Airway- due to large tongue

## 2024-03-24 NOTE — Anesthesia Procedure Notes (Signed)
 Arterial Line Insertion Start/End5/29/2025 7:45 AM, 03/24/2024 7:52 AM Performed by: Erin Havers, MD, anesthesiologist  Patient location: OR. Preanesthetic checklist: patient identified, IV checked, site marked, risks and benefits discussed, surgical consent, monitors and equipment checked, pre-op evaluation, timeout performed and anesthesia consent Left, radial was placed Catheter size: 20 G Hand hygiene performed  and maximum sterile barriers used   Attempts: 1 Procedure performed without using ultrasound guided technique. Following insertion, dressing applied and Biopatch. Post procedure assessment: normal and unchanged  Patient tolerated the procedure well with no immediate complications.

## 2024-03-24 NOTE — Anesthesia Postprocedure Evaluation (Signed)
 Anesthesia Post Note  Patient: [name]  Procedure(s) Performed: TRANSFORAMINAL LUMBAR INTERBODY FUSION (TLIF) WITH PEDICLE SCREW FIXATION 2 LEVEL (Left) DECOMPRESSIVE LUMBAR LAMINECTOMY LEVEL 4     Patient location during evaluation: PACU Anesthesia Type: General Level of consciousness: awake and alert Pain management: pain level controlled Vital Signs Assessment: post-procedure vital signs reviewed and stable Respiratory status: spontaneous breathing, nonlabored ventilation and respiratory function stable Cardiovascular status: blood pressure returned to baseline and stable Postop Assessment: no apparent nausea or vomiting Anesthetic complications: no   No notable events documented.  Last Vitals:  Vitals:   03/24/24 1545 03/24/24 1606  BP: 121/64 123/65  Pulse: 83 81  Resp: 14 16  Temp: 36.5 C   SpO2: 96% 95%    Last Pain:  Vitals:   03/24/24 1545  TempSrc:   PainSc: 0-No pain                 Erin Havers

## 2024-03-24 NOTE — Transfer of Care (Signed)
 Immediate Anesthesia Transfer of Care Note  Patient: [name]  Procedure(s) Performed: TRANSFORAMINAL LUMBAR INTERBODY FUSION (TLIF) WITH PEDICLE SCREW FIXATION 2 LEVEL (Left) DECOMPRESSIVE LUMBAR LAMINECTOMY LEVEL 4  Patient Location: PACU  Anesthesia Type:General  Level of Consciousness: drowsy, patient cooperative, and responds to stimulation  Airway & Oxygen Therapy: Patient Spontanous Breathing and Patient connected to face mask oxygen  Post-op Assessment: Report given to RN, Post -op Vital signs reviewed and stable, and Patient moving all extremities X 4  Post vital signs: Reviewed and stable  Last Vitals:  Vitals Value Taken Time  BP 123/62 03/24/24 1450  Temp    Pulse 90 03/24/24 1453  Resp 16 03/24/24 1453  SpO2 100 % 03/24/24 1453  Vitals shown include unfiled device data.  Last Pain:  Vitals:   03/24/24 0713  TempSrc:   PainSc: 2       Patients Stated Pain Goal: 3 (03/24/24 0713)  Complications: No notable events documented.

## 2024-03-24 NOTE — H&P (Signed)
 Patient tolerated one of his procedure yesterday, without complication or incident.  He presents today for stage II, specifically, a posterior decompression spanning L2-S1, as well as a posterior spinal fusion spanning L2-S1, as well as a transforaminal lumbar interbody fusion at L4-5 and L5-S1.  Will proceed as scheduled.

## 2024-03-24 NOTE — Op Note (Addendum)
 PATIENT NAME: [name]   MEDICAL RECORD NO.:   [id number]   DATE OF BIRTH: 06-Mar-1956  DATE OF PROCEDURE: 03/24/2024                                                            OPERATIVE REPORT     PREOPERATIVE DIAGNOSES: 1.  Bilateral lumbar radiculopathy. 2.  L2-3, L3-4, L4-5, L5-S1 spinal stenosis. 3.  Lumbar degenerative scoliosis spanning L2-S1 4.  1 day status post left-sided lateral interbody fusion L2-3, L3-4 5.  Status post previous decompressive procedures L2-3, L3-4, L5-S1   POSTOPERATIVE DIAGNOSES: 1.  Bilateral lumbar radiculopathy. 2.  L2-3, L3-4, L4-5, L5-S1 spinal stenosis. 3.  Lumbar degenerative scoliosis spanning L2-S1 4.  1 day status post left-sided lateral interbody fusion L2-3, L3-4 5.  Status post previous decompressive procedures L2-3, L3-4, L5-S1   PROCEDURES: 1. Complex decompression L2-3, L3-4, L4-5, L5-S1 2. Left-sided L4-5, L5-S1 transforaminal lumbar interbody fusion 3. Posterolateral fusion L2-3, L3-4, L4-5, L5-S1 4. Insertion of interbody device x2 (Concorde intervertebral spacers). 5. Placement of posterior instrumentation L2, L3, L4, L5, S1 bilaterally. 6. Use of local autograft. 7. Use of morselized allograft - Vivigen 8. Intraoperative use of fluoroscopy.   SURGEON:  [name], MD.   ASSISTANTGeraline Knapp, PA-C.   ANESTHESIA:  General endotracheal anesthesia.   COMPLICATIONS:  None.   DISPOSITION:  Stable.   ESTIMATED BLOOD LOSS:  450cc  (185 retransfused via cell saver)   INDICATIONS FOR SURGERY:  Briefly,  [name] a pleasant 68 year old male who is status post a work injury.  He did previously have decompressions involving L2-3, L3-4, and L5-S1.  More recently, he has been having pain in the bilateral legs, left greater than right.  His imaging studies did reveal a prominent lumbar degenerative scoliosis, with stenosis spanning L2-S1.  Given this, we did discuss proceeding with a staged procedure.  He did have a  lateral interbody fusion at L2-3 and L3-4 on 03/23/2024.  He tolerated the procedure well, and did present today for a posterior decompression and fusion spanning L2-S1.  Of note, the patient is a smoker.  He does understand that smoking does increase his risk for nonunion at 1 or more levels.    OPERATIVE DETAILS:  On  03/24/2024, the patient was brought to surgery and general endotracheal anesthesia was administered.  The patient was placed prone on a well-padded flat Jackson bed with a spinal frame.  Antibiotics were given and a time-out procedure was performed. The back was prepped and draped in the usual fashion.  A midline incision was made overlying the L2-S1 levels.  The fascia was incised at the midline.  The paraspinal musculature was bluntly swept laterally.  Anatomic landmarks for the pedicles were exposed. Using fluoroscopy, I did cannulate the L2, L3, L4, L5, and S1 pedicles bilaterally, using a cortical trajectory technique. On the right side, the posterolateral gutter and bilateral facet joints at L2-3, L3-4, L4-5 and L5-S1 were decorticated and 6 mm screws of the appropriate length were placed bilaterally at L2, L3, L4, and L5.  A 7 mm screw was placed at S1.  A 120 mm rod was secured into the tulip heads of the screws on the right, spanning L2-S1.  Caps were placed and a final locking procedure  was performed. The cannulated pedicles on the left were filled with bone wax.    At this point, I proceeded with the decompressive aspect of the procedure.  At the L2-3, L3-4, L4-5, and L5-S1 levels, a partial facetectomy was performed, decompressing the right and left lateral recesses.  I did meticulously develop a plane being between the bone and the granulation tissue identified at the previously decompressed levels.  Of note, in the left foramen at L4-5 and L5-S1 was noted to be extremely tight.  A liberal partial facetectomy was performed, and the exiting nerves at these levels were  entirely decompressed. I was very pleased with the decompression bilaterally at each level.    I then turned my attention toward the transforaminal fusion portion of the procedure, to be performed on the left at L4-5 and L5-S1.  Starting at L5-S1, there was noted to be a chronic appearing left-sided L5-S1 disc protrusion, compressing the left S1 nerve.  The protrusion was removed, and the S1 nerve was additionally decompressed. Once this was done, with an assistant holding medial retraction of the traversing left S1 nerve, I did perform a thorough and complete L5-S1 intervertebral discectomy using a series of paddle scrapers, curettes, and pituitary rongeurs.  The intervertebral space was then liberally packed with autograft from the decompression, as well as allograft in the form of Vivigen, as was the appropriately sized intervertebral spacer.  The spacer was then gently expanded to 8.5 mm in height.  I then turned my attention to the L4-5 level.  With an assistant maintaining gentle medial retraction of the traversing left L5 nerve, I did perform an annulotomy at the posterolateral aspect of the L4-5 intervertebral space.  I then used a series of paddle scrapers, curettes and pituitary rongeurs to perform a thorough and complete intervertebral diskectomy.  The intervertebral space was then liberally packed with autograft as well as allograft in the form of Vivigen, as was the appropriate-sized intervertebral spacer.  The spacer was then tamped into position in the usual fashion, and expanded to 9.4 mm in height.  I was very pleased with the press-fit of the spacer.    I then placed 6 mm screws on the left at L2, L3 L4, and L5, and a 7 mm screw was placed at S1 on the left.  A 120 mm rod was then placed and caps were placed.  All 10 caps were then locked.  The wound was copiously irrigated with a total of approximately 3 L prior to placing the bone graft.  Additional autograft and allograft were  then packed into the posterolateral gutter and facet joints on the right and left sides, from L1-S1, to help aid in the success of the fusion.  The wound was  explored for any undue bleeding and there was no substantial bleeding encountered.  Gel-Foam was placed over the laminectomy site.  The wound was then closed in layers using #1 Vicryl followed by 2-0 Vicryl, followed by 4-0 Monocryl.  Benzoin and Steri-Strips were applied followed by sterile dressing.  1 g of vancomycin  powder was placed into the wound, both deep to the fascia, and superficial to the fascia.  Of note, Geraline Knapp was my assistant throughout surgery, and did aid in retraction, the decompression, the fusion, placement of the hardware, suctioning, and closure.      [name], MD

## 2024-03-25 ENCOUNTER — Encounter (HOSPITAL_COMMUNITY): Payer: Self-pay | Admitting: Orthopedic Surgery

## 2024-03-25 MED ORDER — HYDROMORPHONE HCL 2 MG PO TABS: 2.0000 mg | ORAL_TABLET | Freq: Four times a day (QID) | ORAL | 0 refills | Status: AC | PRN

## 2024-03-25 MED ORDER — METHOCARBAMOL 500 MG PO TABS: 500.0000 mg | ORAL_TABLET | Freq: Four times a day (QID) | ORAL | 0 refills | Status: DC | PRN

## 2024-03-25 MED ORDER — SILVER SULFADIAZINE 1 % EX CREA
TOPICAL_CREAM | Freq: Two times a day (BID) | CUTANEOUS | Status: DC
  Filled 2024-03-25: qty 85

## 2024-03-25 MED FILL — Heparin Sodium (Porcine) Inj 1000 Unit/ML: INTRAMUSCULAR | Qty: 30 | Status: AC

## 2024-03-25 MED FILL — Thrombin (Recombinant) For Soln 20000 Unit: CUTANEOUS | Qty: 1 | Status: AC

## 2024-03-25 NOTE — Progress Notes (Signed)
    Patient doing well PO Day 2/1 S/P staged LAT/POST Fusion. He reports resolution of his preoperative bilateral leg pain.  He has been up and walking the hallways multiple times and is feeling excellent.  He has well-controlled minimal postoperative low back pain.  He is wearing his TLSO brace appropriately and eager to progress home.  Physical Exam:  Today's Vitals   03/25/24 0313 03/25/24 0630 03/25/24 0713 03/25/24 1034  BP: (!) 141/77  139/72   Pulse: 81  85   Resp: 18  20   Temp: 98.8 F (37.1 C)  98 F (36.7 C)   TempSrc: Oral  Oral   SpO2: 98%  99%   Weight:      Height:      PainSc:  7   7    Body mass index is 32.49 kg/m.   Dressing in place, CDI, patient sitting upright in chair comfortably with TLSO brace worn appropriately.  Normal mood and affect. NVI  POD 2/1 S/P staged LAT/POST Fusion L2-S1, doing excellent with resolved preoperative leg pain and minimal postoperative low back pain, well controlled  - up with PT/OT, encourage ambulation - Dilaudid  for pain, Robaxin  for muscle spasms - d/c home today with f/u in 2 weeks

## 2024-03-25 NOTE — Evaluation (Signed)
 Occupational Therapy Evaluation and Discharge Patient Details Name: [name] MRN: [id number] DOB: 05-17-1956 Today's Date: 03/25/2024   History of Present Illness   Pt is a 68 yo male that underwent on 5/28 direct left lateral interbody fusion, L2/3, L3/4 and on 5/29 underwent complex decompression L2-3, L3-4, L4-5, L5-S1, left-sided L4-5, L5-S1 fusion, and posterolateral fusion L2-3, L3-4, L4-5, L5-S1 per operative report. PHMx: OA, cervical spine ankylosis, CKD 3,CAD s/o CABG, HTN, left carotid artery occlusion, PVD, left vertebral artery occlusion, lumbar lam/disc 2-4     Clinical Impressions This 68 yo male admitted and underwent above presents to acute OT with all education completed and post op back handout provided and reviewed. No further OT needs, we will sign off.     If plan is discharge home, recommend the following:   Assist for transportation;Help with stairs or ramp for entrance;A little help with bathing/dressing/bathroom     Functional Status Assessment   Patient has had a recent decline in their functional status and demonstrates the ability to make significant improvements in function in a reasonable and predictable amount of time. (all education completed)     Equipment Recommendations   Tub/shower seat;Other (comment) (reacher, wide sock aid, x-long handled sponge, x-long shoe horn)      Precautions/Restrictions   Precautions Precautions: Back Precaution Booklet Issued: Yes (comment) Recall of Precautions/Restrictions: Intact Required Braces or Orthoses: Spinal Brace Spinal Brace: Thoracolumbosacral orthotic;Applied in sitting position;Applied in standing position Restrictions Weight Bearing Restrictions Per Provider Order: No     Mobility Bed Mobility               General bed mobility comments: Pt up in recliner upon arrival    Transfers Overall transfer level: Modified independent                 General transfer  comment: VCs for sit<>stand stance to keep back straight          ADL either performed or assessed with clinical judgement   ADL                                         General ADL Comments: Educated on use of wet wipes for back peri care (pt also has a bidet), use of 2 cups for brushing teeth, building up sitting tolerance, use of pillows for positioning in bed, sequence of dressing (family to A prn) and use of AE (educated on this), sit<>stand stance to keep back more straight     Vision Patient Visual Report: No change from baseline              Pertinent Vitals/Pain Pain Assessment Pain Assessment: Faces Faces Pain Scale: Hurts even more Pain Location: incisional Pain Descriptors / Indicators: Sore, Grimacing, Guarding Pain Intervention(s): Limited activity within patient's tolerance, Monitored during session, Repositioned     Extremity/Trunk Assessment Upper Extremity Assessment Upper Extremity Assessment: Overall WFL for tasks assessed           Communication  No issues   Cognition Arousal: Alert   Cognition: No apparent impairments                                       Cueing  Cueing Techniques: Verbal cues  Home Living Family/patient expects to be discharged to:: Private residence Living Arrangements: Spouse/significant other;Children Available Help at Discharge: Family;Available PRN/intermittently Type of Home: House Home Access: Stairs to enter [employer] of Steps: 5 Entrance Stairs-Rails: Left Home Layout: One level     Bathroom Shower/Tub: Tub/shower unit;Curtain   Bathroom Toilet: Handicapped height                Prior Functioning/Environment Prior Level of Function : Independent/Modified Independent                    OT Problem List: Decreased range of motion;Decreased strength;Impaired balance (sitting and/or standing);Pain        OT Goals(Current goals  can be found in the care plan section)   Acute Rehab OT Goals Patient Stated Goal: to go home today         AM-PAC OT "6 Clicks" Daily Activity     Outcome Measure Help from another person eating meals?: None Help from another person taking care of personal grooming?: None Help from another person toileting, which includes using toliet, bedpan, or urinal?: None Help from another person bathing (including washing, rinsing, drying)?: A Little Help from another person to put on and taking off regular upper body clothing?: A Little Help from another person to put on and taking off regular lower body clothing?: A Little 6 Click Score: 21   End of Session Equipment Utilized During Treatment: Rolling walker (2 wheels);Back brace Nurse Communication:  (tub seat, RW, reacher, wide sock aid, x-long handled sponge, x-long shoe horn)  Activity Tolerance: Patient tolerated treatment well Patient left: in chair;with call bell/phone within reach  OT Visit Diagnosis: Unsteadiness on feet (R26.81);Pain Pain - part of body:  (incisional)                Time: 3086-5784 OT Time Calculation (min): 39 min Charges:  OT General Charges $OT Visit: 1 Visit OT Evaluation $OT Eval Moderate Complexity: 1 Mod OT Treatments $Self Care/Home Management : 23-37 mins  Merryl Abraham OT Acute Rehabilitation Services Office [phone]    Lenox Raider 03/25/2024, 9:35 AM

## 2024-03-25 NOTE — Progress Notes (Signed)
 Patient awaiting family for discharge home, Patient in no acute distress nor complaints of pain nor discomfort; incision on back is clean, dry and intact; No c/o pain at this time. Room was checked and accounted for all patient's belongings; discharge instructions concerning her medications, incision care, follow up appointment and when to call the doctor as needed were all discussed with patient by RN and he expressed understanding on the instructions given.

## 2024-03-25 NOTE — TOC Transition Note (Signed)
 Transition of Care [facility]) - Discharge Note   Patient Details  Name: [name] MRN: [id number] Date of Birth: 09-01-1956  Transition [facility]) CM/SW Contact:  Jonathan Neighbor, RN Phone Number: 03/25/2024, 11:31 AM   Clinical Narrative:     Pt is discharging home with self care. His spouse can provide needed assistance.  Walker/ shower chair covered by his workers comp: Radiographer, therapeutic and provided by [employer]. This was delivered to his room. Other DME Alyce Baba is to have sent to patients home. Pt is aware. Wife transporting home.  Sophia with Alyce Baba: [phone]  Final next level of care: Home/Self Care Barriers to Discharge: No Barriers Identified   Patient Goals and CMS Choice            Discharge Placement                       Discharge Plan and Services Additional resources added to the After Visit Summary for                  DME Arranged: Shower stool, Walker rolling (reacher/ sock aid/ long handle sponge and long handle shoe horn) DME Agency: [employer]     Representative spoke with at DME Agency: DME ticket            Social Drivers of Health (SDOH) Interventions SDOH Screenings   Food Insecurity: No Food Insecurity (09/04/2023)  Housing: Low Risk  (09/04/2023)  Transportation Needs: No Transportation Needs (09/04/2023)  Utilities: Not At Risk (09/04/2023)  Alcohol  Screen: Low Risk  (03/26/2021)  Depression (PHQ2-9): Low Risk  (07/14/2023)  Social Connections: Unknown (03/11/2022)   Received from [facility]  Tobacco Use: High Risk (03/24/2024)     Readmission Risk Interventions     No data to display

## 2024-03-25 NOTE — Evaluation (Signed)
 Physical Therapy Evaluation  Patient Details Name: [name] MRN: [id number] DOB: 15-Oct-1956 Today's Date: 03/25/2024  History of Present Illness  Pt is a 68 yo male that underwent on 5/28 direct left lateral interbody fusion, L2/3, L3/4 and on 5/29 underwent complex decompression L2-3, L3-4, L4-5, L5-S1, left-sided L4-5, L5-S1 fusion, and posterolateral fusion L2-3, L3-4, L4-5, L5-S1 per operative report. PHMx: OA, cervical spine ankylosis, CKD 3,CAD s/o CABG, HTN, left carotid artery occlusion, PVD, left vertebral artery occlusion, lumbar lam/disc 2-4   Clinical Impression  Pt admitted with above diagnosis. At the time of PT eval, pt was able to demonstrate transfers with mod I and ambulation with gross CGA to supervision for safety with RW for support. Pt was educated on precautions, brace application/wearing schedule, appropriate activity progression, and car transfer. Pt currently with functional limitations due to the deficits listed below (see PT Problem List). Pt will benefit from skilled PT to increase their independence and safety with mobility to allow discharge to the venue listed below.          If plan is discharge home, recommend the following: A little help with walking and/or transfers;A little help with bathing/dressing/bathroom;Assistance with cooking/housework;Assist for transportation;Help with stairs or ramp for entrance   Can travel by private vehicle        Equipment Recommendations Rolling walker (2 wheels)  Recommendations for Other Services       Functional Status Assessment Patient has had a recent decline in their functional status and demonstrates the ability to make significant improvements in function in a reasonable and predictable amount of time.     Precautions / Restrictions Precautions Precautions: Back Precaution Booklet Issued: Yes (comment) Recall of Precautions/Restrictions: Intact Precaution/Restrictions Comments: Reviewed handout and pt  was cued for precautions during functional mobility. Required Braces or Orthoses: Spinal Brace Spinal Brace: Thoracolumbosacral orthotic;Applied in sitting position;Applied in standing position Restrictions Weight Bearing Restrictions Per Provider Order: No      Mobility  Bed Mobility               General bed mobility comments: Pt sitting up on EOB upon PT arrival.    Transfers Overall transfer level: Modified independent Equipment used: Rolling walker (2 wheels)               General transfer comment: Several practice reps. Pt was able to power up to full stand without UE support.    Ambulation/Gait Ambulation/Gait assistance: Contact guard assist, Supervision Gait Distance (Feet): 300 Feet Assistive device: Rolling walker (2 wheels) Gait Pattern/deviations: Step-through pattern, Decreased stride length, Trunk flexed Gait velocity: Decreased Gait velocity interpretation: <1.31 ft/sec, indicative of household ambulator   General Gait Details: VC's for improved posture, closer walker proximity and forward gaze. No assist required but pt grossly flexed throughout session.  Stairs Stairs: Yes Stairs assistance: Contact guard assist Stair Management: One rail Right, Step to pattern, Forwards Number of Stairs: 5 General stair comments: VC's for sequencing and general safety.  Wheelchair Mobility     Tilt Bed    Modified Rankin (Stroke Patients Only)       Balance Overall balance assessment: Needs assistance Sitting-balance support: Feet supported, No upper extremity supported Sitting balance-Leahy Scale: Fair                                       Pertinent Vitals/Pain Pain Assessment Pain Assessment: Faces Faces Pain Scale:  Hurts even more Pain Location: incisional Pain Descriptors / Indicators: Sore, Grimacing, Guarding Pain Intervention(s): Limited activity within patient's tolerance, Monitored during session, Repositioned     Home Living Family/patient expects to be discharged to:: Private residence Living Arrangements: Spouse/significant other;Children Available Help at Discharge: Family;Available PRN/intermittently Type of Home: House Home Access: Stairs to enter Entrance Stairs-Rails: Left Entrance Stairs-Number of Steps: 5   Home Layout: One level        Prior Function Prior Level of Function : Independent/Modified Independent                     Extremity/Trunk Assessment   Upper Extremity Assessment Upper Extremity Assessment: Defer to OT evaluation;Overall WFL for tasks assessed    Lower Extremity Assessment Lower Extremity Assessment: Generalized weakness (Mild; consistent with pre-op diagnosis)    Cervical / Trunk Assessment Cervical / Trunk Assessment: Back Surgery  Communication   Communication Communication: No apparent difficulties    Cognition Arousal: Alert                               Following commands: Intact       Cueing Cueing Techniques: Verbal cues     General Comments      Exercises     Assessment/Plan    PT Assessment Patient needs continued PT services  PT Problem List Decreased strength;Decreased activity tolerance;Decreased balance;Decreased mobility;Decreased knowledge of use of DME;Decreased safety awareness;Decreased knowledge of precautions;Pain       PT Treatment Interventions DME instruction;Gait training;Stair training;Functional mobility training;Therapeutic activities;Therapeutic exercise;Balance training;Patient/family education    PT Goals (Current goals can be found in the Care Plan section)  Acute Rehab PT Goals Patient Stated Goal: Home today PT Goal Formulation: With patient/family Time For Goal Achievement: 04/01/24 Potential to Achieve Goals: Good    Frequency Min 5X/week     Co-evaluation               AM-PAC PT "6 Clicks" Mobility  Outcome Measure Help needed turning from your back to your  side while in a flat bed without using bedrails?: A Little Help needed moving from lying on your back to sitting on the side of a flat bed without using bedrails?: A Little Help needed moving to and from a bed to a chair (including a wheelchair)?: A Little Help needed standing up from a chair using your arms (e.g., wheelchair or bedside chair)?: A Little Help needed to walk in hospital room?: A Little Help needed climbing 3-5 steps with a railing? : A Little 6 Click Score: 18    End of Session Equipment Utilized During Treatment: Gait belt;Back brace Activity Tolerance: Patient tolerated treatment well Patient left: in bed;with call bell/phone within reach;with family/visitor present Nurse Communication: Mobility status PT Visit Diagnosis: Unsteadiness on feet (R26.81);Pain Pain - part of body:  (back)    Time: 7169-6789 PT Time Calculation (min) (ACUTE ONLY): 16 min   Charges:   PT Evaluation $PT Eval Low Complexity: 1 Low   PT General Charges $$ ACUTE PT VISIT: 1 Visit         Simone Dubois, PT, DPT Acute Rehabilitation Services Secure Chat Preferred Office: [phone]   Venus Ginsberg 03/25/2024, 1:56 PM

## 2024-03-26 LAB — POCT I-STAT 7, (LYTES, BLD GAS, ICA,H+H)
Acid-base deficit: 7 mmol/L — ABNORMAL HIGH (ref 0.0–2.0)
Acid-base deficit: 6 mmol/L — ABNORMAL HIGH (ref 0.0–2.0)
Bicarbonate: 20.6 mmol/L (ref 20.0–28.0)
Bicarbonate: 21.4 mmol/L (ref 20.0–28.0)
Calcium, Ion: 1.14 mmol/L — ABNORMAL LOW (ref 1.15–1.40)
Calcium, Ion: 1.09 mmol/L — ABNORMAL LOW (ref 1.15–1.40)
HCT: 34 % — ABNORMAL LOW (ref 39.0–52.0)
HCT: 32 % — ABNORMAL LOW (ref 39.0–52.0)
Hemoglobin: 11.6 g/dL — ABNORMAL LOW (ref 13.0–17.0)
Hemoglobin: 10.9 g/dL — ABNORMAL LOW (ref 13.0–17.0)
O2 Saturation: 100 %
O2 Saturation: 99 %
Potassium: 4 mmol/L (ref 3.5–5.1)
Potassium: 4.3 mmol/L (ref 3.5–5.1)
Sodium: 134 mmol/L — ABNORMAL LOW (ref 135–145)
Sodium: 135 mmol/L (ref 135–145)
TCO2: 22 mmol/L (ref 22–32)
TCO2: 23 mmol/L (ref 22–32)
pCO2 arterial: 46.8 mmHg (ref 32–48)
pCO2 arterial: 47.7 mmHg (ref 32–48)
pH, Arterial: 7.252 — ABNORMAL LOW (ref 7.35–7.45)
pH, Arterial: 7.259 — ABNORMAL LOW (ref 7.35–7.45)
pO2, Arterial: 206 mmHg — ABNORMAL HIGH (ref 83–108)
pO2, Arterial: 182 mmHg — ABNORMAL HIGH (ref 83–108)

## 2024-03-28 ENCOUNTER — Telehealth: Payer: Self-pay

## 2024-03-28 MED FILL — Sodium Chloride Irrigation Soln 0.9%: Qty: 3000 | Status: AC

## 2024-03-28 MED FILL — Sodium Chloride IV Soln 0.9%: INTRAVENOUS | Qty: 1000 | Status: AC

## 2024-03-28 MED FILL — Heparin Sodium (Porcine) Inj 1000 Unit/ML: INTRAMUSCULAR | Qty: 30 | Status: AC

## 2024-03-28 NOTE — Transitions of Care (Post Inpatient/ED Visit) (Signed)
 03/28/2024  Name: [name] MRN: [id number] DOB: 08-Oct-1956  Today's TOC FU Call Status: Today's TOC FU Call Status:: Successful TOC FU Call Completed TOC FU Call Complete Date: 03/28/24 Patient's Name and Date of Birth confirmed.  Transition Care Management Follow-up Telephone Call Date of Discharge: 03/25/24 Discharge Facility: [facility]) Type of Discharge: Inpatient Admission How have you been since you were released from the hospital?: Better Any questions or concerns?: No  Items Reviewed: Did you receive and understand the discharge instructions provided?: Yes Medications obtained,verified, and reconciled?: Yes (Medications Reviewed) Any new allergies since your discharge?: No Dietary orders reviewed?: Yes Do you have support at home?: Yes People in Home [RPT]: spouse, other relative(s)  Medications Reviewed Today: Medications Reviewed Today     Reviewed by Darrall Ellison, LPN (Licensed Practical Nurse) on 03/28/24 at [phone]  Med List Status: <None>   Medication Order Taking? Sig Documenting Provider Last Dose Status Informant  acetaminophen  (TYLENOL ) 500 MG tablet [id number] No Take 1,000 mg by mouth every 6 (six) hours as needed for mild pain. [provider] Past Week Active Self  amLODipine  (NORVASC ) 10 MG tablet [id number] No Take 1 tablet (10 mg total) by mouth daily.  Patient taking differently: Take 10 mg by mouth at bedtime.   Loyde Rule, MD 03/22/2024 Active Self  aspirin  EC 81 MG tablet [id number] No Take 1 tablet (81 mg total) by mouth daily. Onetha Bile, NP 03/16/2024 Active Self  azelastine  (ASTELIN ) 0.1 % nasal spray [id number] No Place 2 sprays into both nostrils 2 (two) times daily. Use in each nostril as directed  Patient taking differently: Place 2 sprays into both nostrils daily. Use in each nostril as directed   Artice Last, MD 03/23/2024  4:30 AM Active Self  Bacillus Coagulans-Inulin (PROBIOTIC-PREBIOTIC PO) [id number] No  Take 1 capsule by mouth daily. [provider] Past Week Active Self  cetirizine  (ZYRTEC ) 10 MG tablet [id number] No Take 1 tablet (10 mg total) by mouth daily.  Patient taking differently: Take 10 mg by mouth at bedtime.   Soldatova, [place], MD 03/22/2024 Active Self  clopidogrel  (PLAVIX ) 75 MG tablet [id number] No TAKE ONE TABLET (75 MG TOTAL) BY MOUTH DAILY. Nishan, Peter C, MD 03/16/2024 Active Self  HYDROmorphone  (DILAUDID ) 2 MG tablet [id number]  Take 1 tablet (2 mg total) by mouth every 6 (six) hours as needed for up to 7 days for severe pain (pain score 7-10). McKenzie, Kayla J, PA-C  Active   losartan -hydrochlorothiazide  (HYZAAR) 100-12.5 MG tablet [id number] No Take 1 tablet by mouth daily. Von Grumbling, PA-C 03/22/2024 Active Self  methocarbamol  (ROBAXIN ) 500 MG tablet [id number] No Take 500 mg by mouth every 8 (eight) hours as needed for muscle spasms. [provider] Past Week Active Self  methocarbamol  (ROBAXIN ) 500 MG tablet [id number]  Take 1-2 tablets (500-1,000 mg total) by mouth every 6 (six) hours as needed for muscle spasms. McKenzie, Kayla J, PA-C  Active   nitroGLYCERIN  (NITROSTAT ) 0.4 MG SL tablet [id number] No Place 1 tablet (0.4 mg total) under the tongue every 5 (five) minutes as needed for chest pain. Von Grumbling, [place] Not Taking Expired 03/07/24 2359 Self           Med Note Shann Darnel, KYLE A   Mon Mar 07, 2024 11:31 AM)    pantoprazole  (PROTONIX ) 40 MG tablet [id number] No TAKE ONE TABLET (40 MG TOTAL) BY MOUTH DAILY. Nishan, Peter C, MD 03/23/2024  4:30 AM Active   REPATHA   SURECLICK 140 MG/ML SOAJ [id number] No INJECT ONE ML INTO THE SKIN EVERY 14 DAYS. Loyde Rule, MD 03/21/2024 Active Self            Home Care and Equipment/Supplies: Were Home Health Services Ordered?: NA Any new equipment or medical supplies ordered?: Yes Name of Medical supply agency?: unknown Were you able to get the equipment/medical supplies?: Yes Do you have any questions  related to the use of the equipment/supplies?: No  Functional Questionnaire: Do you need assistance with bathing/showering or dressing?: Yes Do you need assistance with meal preparation?: No Do you need assistance with eating?: No Do you have difficulty maintaining continence: No Do you need assistance with getting out of bed/getting out of a chair/moving?: Yes Do you have difficulty managing or taking your medications?: No  Follow up appointments reviewed: PCP Follow-up appointment confirmed?: NA Specialist Hospital Follow-up appointment confirmed?: No Reason Specialist Follow-Up Not Confirmed: Patient has Specialist Provider Number and will Call for Appointment Do you need transportation to your follow-up appointment?: No Do you understand care options if your condition(s) worsen?: Yes-patient verbalized understanding    SIGNATURE Darrall Ellison, LPN [facility] Nurse Health Advisor Direct Dial [phone]

## 2024-03-30 ENCOUNTER — Other Ambulatory Visit: Payer: Self-pay | Admitting: Cardiovascular Disease

## 2024-03-30 ENCOUNTER — Telehealth: Payer: Self-pay | Admitting: Cardiovascular Disease

## 2024-03-30 ENCOUNTER — Other Ambulatory Visit: Payer: Self-pay | Admitting: Physician Assistant

## 2024-03-30 ENCOUNTER — Telehealth: Payer: Self-pay | Admitting: Pharmacy Technician

## 2024-03-30 DIAGNOSIS — E782 Mixed hyperlipidemia: Secondary | ICD-10-CM

## 2024-03-30 DIAGNOSIS — I2510 Atherosclerotic heart disease of native coronary artery without angina pectoris: Secondary | ICD-10-CM

## 2024-03-30 DIAGNOSIS — I10 Essential (primary) hypertension: Secondary | ICD-10-CM

## 2024-03-30 DIAGNOSIS — N183 Chronic kidney disease, stage 3 unspecified: Secondary | ICD-10-CM

## 2024-03-30 DIAGNOSIS — I739 Peripheral vascular disease, unspecified: Secondary | ICD-10-CM

## 2024-03-30 DIAGNOSIS — E781 Pure hyperglyceridemia: Secondary | ICD-10-CM

## 2024-03-30 NOTE — Telephone Encounter (Signed)
 Pharmacy Patient Advocate Encounter  Received notification from [facility] that Prior Authorization for repatha  has been APPROVED from 03/30/24 to 03/30/25. Spoke to pharmacy to process.Copay is $30.00.    PA #/Case ID/Reference #: [id number]

## 2024-03-30 NOTE — Telephone Encounter (Signed)
 Pt c/o medication issue:  1. Name of Medication:   REPATHA  SURECLICK 140 MG/ML SOAJ   2. How are you currently taking this medication (dosage and times per day)?   3. Are you having a reaction (difficulty breathing--STAT)?   4. What is your medication issue?   Caller Larinda Plover) stated they have initiated a prior authorization for patient to get this medication.  Caller requested a call back when prior authorization approved.

## 2024-03-30 NOTE — Telephone Encounter (Signed)
 Pharmacy Patient Advocate Encounter   Received notification from CoverMyMeds that prior authorization for repatha  is required/requested.   Insurance verification completed.   The patient is insured through [facility] .   Per test claim: PA required; PA submitted to above mentioned insurance via CoverMyMeds Key/confirmation #/EOC [id number] Status is pending

## 2024-03-30 NOTE — Telephone Encounter (Signed)
 PA request has been Submitted. New Encounter has been or will be created for follow up. For additional info see Pharmacy Prior Auth telephone encounter from 03/30/24.

## 2024-03-31 MED FILL — Sodium Chloride Irrigation Soln 0.9%: Qty: 3000 | Status: AC

## 2024-03-31 MED FILL — Sodium Chloride IV Soln 0.9%: INTRAVENOUS | Qty: 1000 | Status: AC

## 2024-04-04 ENCOUNTER — Other Ambulatory Visit: Payer: Self-pay | Admitting: Cardiovascular Disease

## 2024-04-04 DIAGNOSIS — E782 Mixed hyperlipidemia: Secondary | ICD-10-CM

## 2024-04-04 DIAGNOSIS — N183 Chronic kidney disease, stage 3 unspecified: Secondary | ICD-10-CM

## 2024-04-04 DIAGNOSIS — I10 Essential (primary) hypertension: Secondary | ICD-10-CM

## 2024-04-04 DIAGNOSIS — I2510 Atherosclerotic heart disease of native coronary artery without angina pectoris: Secondary | ICD-10-CM

## 2024-04-19 NOTE — Discharge Summary (Signed)
 Patient ID: [name] MRN: [id number] DOB/AGE: [birth year] 68 y.o.  Admit date: 03/23/2024 Discharge date: 03/25/2024  Admission Diagnoses:  Principal Problem:   Radiculopathy   Discharge Diagnoses:  Same  Past Medical History:  Diagnosis Date   Arthritis    Cervical spine ankylosis    CKD (chronic kidney disease) stage 3, GFR 30-59 ml/min (HCC)    COLONIC POLYPS, HX OF    CORONARY ARTERY DISEASE    a. s/p stent to RCA 2005. b. Stent to Cx 2007. c.  CABG X4 2014; d. LHC 04/19/18 occluded SVG-Circ, DES to in-stent restenosis of circ.    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 male who was admitted 03/23/2024 for operative treatment of Radiculopathy. Patient has severe unremitting pain that affects sleep, daily activities, and work/hobbies. After pre-op clearance the patient was taken to the operating room on 03/24/2024 and underwent  Procedure(s): TRANSFORAMINAL LUMBAR INTERBODY FUSION (TLIF) WITH PEDICLE SCREW FIXATION 2 LEVEL DECOMPRESSIVE LUMBAR LAMINECTOMY LEVEL 4.    Patient was given perioperative antibiotics:  Anti-infectives (From admission, onward)    Start      Dose/Rate Route Frequency Ordered Stop   03/24/24 1326  vancomycin  (VANCOCIN ) powder  Status:  Discontinued          As needed 03/24/24 1327 03/24/24 1445   03/24/24 0715  ceFAZolin  (ANCEF ) IVPB 2g/100 mL premix        2 g 200 mL/hr over 30 Minutes Intravenous On call to O.R. 03/24/24 0710 03/24/24 1430   03/23/24 1330  ceFAZolin  (ANCEF ) IVPB 2g/100 mL premix        2 g 200 mL/hr over 30 Minutes Intravenous Every 8 hours 03/23/24 1319 03/23/24 2134   03/23/24 0600  ceFAZolin  (ANCEF ) IVPB 2g/100 mL premix        2 g 200 mL/hr over 30 Minutes Intravenous On call to O.R. 03/23/24 9453 03/23/24 9177        Patient was given sequential compression devices, early ambulation to prevent DVT.  Patient benefited maximally from hospital stay and there were no complications.    Recent vital signs: BP 139/72 (BP Location: Left Arm)   Pulse 85   Temp 98 F (36.7 C) (Oral)   Resp 20   Ht 5' 9 (1.753 m)   Wt 99.8 kg   SpO2 99%   BMI 32.49 kg/m    Discharge Medications:   Allergies as of 03/25/2024       Reactions   Statins Other (See Comments)   Bone and muscle pain   Crestor  [rosuvastatin ]    myalgias  Flonase  [fluticasone ]    Nose bleeding    Lipitor [atorvastatin]    myalgias   Oxycodone  Nausea And Vomiting   Hydrocodone  Nausea And Vomiting        Medication List     TAKE these medications    acetaminophen  500 MG tablet Commonly known as: TYLENOL  Take 1,000 mg by mouth every 6 (six) hours as needed for mild pain.   amLODipine  10 MG tablet Commonly known as: NORVASC  Take 1 tablet (10 mg total) by mouth daily. What changed: when to take this   aspirin  EC 81 MG tablet Take 1 tablet (81 mg total) by mouth daily.   azelastine  0.1 % nasal spray Commonly known as: ASTELIN  Place 2 sprays into both nostrils 2 (two) times daily. Use in each nostril as directed What changed: when to take this   cetirizine  10 MG tablet Commonly known as: ZYRTEC  Take 1 tablet (10 mg  total) by mouth daily. What changed: when to take this   clopidogrel  75 MG tablet Commonly known as: PLAVIX  TAKE ONE TABLET (75 MG TOTAL) BY MOUTH DAILY.   methocarbamol  500 MG tablet Commonly known as: ROBAXIN  Take 500 mg by mouth every 8 (eight) hours as needed for muscle spasms. What changed: Another medication with the same name was added. Make sure you understand how and when to take each.   methocarbamol  500 MG tablet Commonly known as: ROBAXIN  Take 1-2 tablets (500-1,000 mg total) by mouth every 6 (six) hours as needed for muscle spasms. What changed: You were already taking a medication with the same name, and this prescription was added. Make sure you understand how and when to take each.   nitroGLYCERIN  0.4 MG SL tablet Commonly known as: NITROSTAT  Place 1 tablet (0.4 mg total) under the tongue every 5 (five) minutes as needed for chest pain.   pantoprazole  40 MG tablet Commonly known as: PROTONIX  TAKE ONE TABLET (40 MG TOTAL) BY MOUTH DAILY.   PROBIOTIC-PREBIOTIC PO Take 1 capsule by mouth daily.       ASK your doctor about these medications    HYDROmorphone  2 MG tablet Commonly known as: DILAUDID  Take 1 tablet (2 mg total) by mouth every 6 (six) hours as needed for up to 7 days for severe pain (pain score 7-10). Ask about: Should I take this medication?        Diagnostic Studies: DG Lumbar Spine 2-3 Views Result Date: 03/24/2024 CLINICAL DATA:  Elective surgery. EXAM: LUMBAR SPINE - 2-3 VIEW COMPARISON:  Preoperative imaging FINDINGS: Two fluoroscopic spot views of the lumbar spine submitted from the operating room. Posterior rod and pedicle screw fixation 5 contiguous levels with interbody spacers. Levels are difficult to delineate due to coned views. Fluoroscopy time 1 minutes 56 seconds. Dose 89.1 mGy. IMPRESSION: Intraoperative fluoroscopy during lumbar spine surgery. Electronically Signed   By: Andrea Gasman M.D.   On: 03/24/2024 15:18   DG C-Arm 1-60  Min-No Report Result Date: 03/24/2024 Fluoroscopy was utilized by the requesting physician.  No radiographic interpretation.   DG C-Arm 1-60 Min-No Report Result Date: 03/24/2024 Fluoroscopy was utilized by the requesting physician.  No radiographic interpretation.   DG C-Arm 1-60 Min-No Report Result Date: 03/24/2024 Fluoroscopy was utilized by the requesting physician.  No radiographic interpretation.   DG C-Arm 1-60 Min-No Report Result Date: 03/24/2024 Fluoroscopy was utilized by the requesting physician.  No radiographic interpretation.   DG C-Arm 1-60 Min-No Report Result Date: 03/24/2024 Fluoroscopy was utilized by the requesting physician.  No  radiographic interpretation.   DG C-Arm 1-60 Min-No Report Result Date: 03/24/2024 Fluoroscopy was utilized by the requesting physician.  No radiographic interpretation.   DG Lumbar Spine 1 View Result Date: 03/24/2024 CLINICAL DATA:  Intraop imaging for localization EXAM: LUMBAR SPINE - 1 VIEW COMPARISON:  08/12/2023 FINDINGS: Posterior needles overlie the L1 spinous process directed at the L2-3 disc space and overlie the posterior sacrum directed at S1. Postoperative changes at L2-3 and L3-4. Degenerative changes diffusely. IMPRESSION: Intraoperative localization as above. Electronically Signed   By: Franky Crease M.D.   On: 03/24/2024 13:24   DG Lumbar Spine 2-3 Views Result Date: 03/23/2024 CLINICAL DATA:  Elective surgery. EXAM: LUMBAR SPINE - 2-3 VIEW COMPARISON:  Preoperative imaging FINDINGS: Two fluoroscopic spot views of the lumbar spine submitted from the operating room. There are interbody spacers at 2 contiguous levels. Cannot delineate levels due to coned views. Fluoroscopy time 3 minutes 22 seconds. Dose 178.22 mGy. IMPRESSION: Intraoperative fluoroscopy during lumbar spine surgery. Electronically Signed   By: Andrea Gasman M.D.   On: 03/23/2024 10:52   DG C-Arm 1-60 Min-No Report Result Date: 03/23/2024 Fluoroscopy was  utilized by the requesting physician.  No radiographic interpretation.   DG C-Arm 1-60 Min-No Report Result Date: 03/23/2024 Fluoroscopy was utilized by the requesting physician.  No radiographic interpretation.    Disposition: Discharge disposition: 01-Home or Self Care        POD 2/1 S/P staged LAT/POST Fusion L2-S1, doing excellent with resolved preoperative leg pain and minimal postoperative low back pain, well controlled   - up with PT/OT, encourage ambulation - Dilaudid  for pain, Robaxin  for muscle spasms -Scripts for pain sent to pharmacy electronically  -D/C instructions sheet printed and in chart -D/C today  -F/U in office 2 weeks   Signed: Ileana PARAS Onesty Clair 04/19/2024, 12:06 PM

## 2024-05-27 NOTE — Procedures (Signed)
Mask fit

## 2024-06-21 ENCOUNTER — Other Ambulatory Visit: Payer: Self-pay | Admitting: Surgery

## 2024-06-21 DIAGNOSIS — I739 Peripheral vascular disease, unspecified: Secondary | ICD-10-CM

## 2024-07-20 ENCOUNTER — Ambulatory Visit: Payer: Self-pay

## 2024-07-20 NOTE — Telephone Encounter (Signed)
 FYI Only or Action Required?: Action required by provider: request for appointment.  Patient was last seen in primary care on 02/02/2024 by Aletha Bene, MD.  Called Nurse Triage reporting Back Pain.  Symptoms began not sure.  Interventions attempted: OTC medications: tylenol  .  Symptoms are: gradually worsening.  Triage Disposition: See PCP When Office is Open (Within 3 Days)  Patient/caregiver understands and will follow disposition?: YesCopied from CRM [id number]. Topic: Clinical - Red Word Triage >> Jul 20, 2024 11:11 AM [name] wrote: Red Word that prompted transfer to Nurse Triage:  When he gets up and down, it hurts extremely bad on his right lower back. It has been going on for several weeks. His pain level is up to a 10 Reason for Disposition  [1] MODERATE back pain (e.g., interferes with normal activities) AND [2] present > 3 days  Answer Assessment - Initial Assessment Questions Pt had back surgery in May. Pt was seen at PT yesterday and was told to see PCP. Hurts to get up/down.      1. ONSET: When did the pain begin? (e.g., minutes, hours, days)     Long time 2. LOCATION: Where does it hurt? (upper, mid or lower back)     Right lower back  3. SEVERITY: How bad is the pain?  (e.g., Scale 1-10; mild, moderate, or severe)     Now-2 but can be a 10 4. PATTERN: Is the pain constant? (e.g., yes, no; constant, intermittent)      Depends on sitting and lying  5. RADIATION: Does the pain shoot into your legs or somewhere else?     denies 6. CAUSE:  What do you think is causing the back pain?      Not sure 7. BACK OVERUSE:  Any recent lifting of heavy objects, strenuous work or exercise?     na 8. MEDICINES: What have you taken so far for the pain? (e.g., nothing, acetaminophen , NSAIDS)     Tylenol   9. NEUROLOGIC SYMPTOMS: Do you have any weakness, numbness, or problems with bowel/bladder control?     denies 10. OTHER SYMPTOMS: Do you have any other  symptoms? (e.g., fever, abdomen pain, burning with urination, blood in urine)       denies  Protocols used: Back Pain-A-AH

## 2024-07-21 ENCOUNTER — Ambulatory Visit: Admitting: Family Medicine

## 2024-07-21 ENCOUNTER — Encounter: Payer: Self-pay | Admitting: Family Medicine

## 2024-07-21 VITALS — BP 124/74 | HR 77 | Temp 97.8°F | Ht 69.0 in | Wt 218.4 lb

## 2024-07-21 DIAGNOSIS — M545 Low back pain, unspecified: Secondary | ICD-10-CM | POA: Diagnosis not present

## 2024-07-21 MED ORDER — HYDROMORPHONE HCL 2 MG PO TABS: 2.0000 mg | ORAL_TABLET | Freq: Four times a day (QID) | ORAL | 0 refills | Status: DC | PRN

## 2024-07-21 MED ORDER — PREDNISONE 20 MG PO TABS
ORAL_TABLET | ORAL | 0 refills | Status: DC
Start: 2024-07-21 — End: 2024-08-26

## 2024-07-21 NOTE — Progress Notes (Signed)
 Subjective:    Patient ID: [name], male    DOB: Apr 01, 1956, 68 y.o.   MRN: [id number]  Back Pain  Patient presents today complaining of severe pain in the right side of his back.  It radiates to his right anterior hip joint.  Movement elicits the pain.  Specifically rolling over in bed or standing up elicits the pain.  He has no pain with flexion or internal/external rotation of the hip passively.  He denies any hematuria or dysuria.  His abdomen is soft and nondistended and nontender to palpation.  He denies any fevers or chills.  Palpation of his lumbar spine elicits no pain.  However movement recreates the pain.  As long as he holds still, there is no pain in his lower back however as soon as he tries to move he reports a tearing pain in his right lower back and also in his right anterior hip.  He denies any specific injury.  Of note, the patient had back surgery in May.  He had a multilevel lumbar fusion.  He denies any numbness or tingling or weakness in his legs.  He denies any saddle anesthesia.  He denies any bowel or bladder incontinence.  He is unable to take NSAIDs due to cardiovascular disease Past Medical History:  Diagnosis Date   Arthritis    Cervical spine ankylosis    CKD (chronic kidney disease) stage 3, GFR 30-59 ml/min (HCC)    COLONIC POLYPS, HX OF    CORONARY ARTERY DISEASE    a. s/p stent to RCA 2005. b. Stent to Cx 2007. c.  CABG X4 2014; d. LHC 04/19/18 occluded SVG-Circ, DES to in-stent restenosis of circ.    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/16/2022   SLEEP  APNEA    on CPAP   Statin intolerance    Tobacco abuse    Vertebral artery occlusion, left    Warthin's tumor    left   Past Surgical History:  Procedure Laterality Date   ABDOMINAL AORTOGRAM W/LOWER EXTREMITY N/A 02/13/2022   Procedure: ABDOMINAL AORTOGRAM W/LOWER EXTREMITY;  Surgeon: Court Dorn PARAS, MD;  Location: [facility];  Service: Cardiovascular;  Laterality: N/A;   ABDOMINAL AORTOGRAM W/LOWER EXTREMITY N/A 08/28/2022   Procedure: ABDOMINAL AORTOGRAM W/LOWER EXTREMITY;  Surgeon: Gretta Lonni PARAS, MD;  Location: [facility];  Service: Cardiovascular;  Laterality: N/A;   ACHILLES TENDON REPAIR     ANTERIOR LAT LUMBAR FUSION Left 03/23/2024   Procedure: LEFT-SIDED LUMBAR TWO - LUMBAR THREE, LUMBAR THREE - LUMBAR FOUR LATERAL INTERBODY FUSION WITH INSTRUMENTATION AND ALLOGRAFT;  Surgeon: Beuford Anes, MD;  Location: [facility];  Service: Orthopedics;  Laterality: Left;  LEFT-SIDED LUMBAR 2 - LUMBAR 3, LUMBAR 3- LUMBAR 4 LATERAL INTERBODY FUSION WITH INSTRUMENTATION AND ALLOGRAFT   BACK SURGERY     lower back   CARDIAC CATHETERIZATION     CARPAL TUNNEL RELEASE Left 12/07/2023   Procedure: LEFT CARPAL TUNNEL RELEASE;  Surgeon: Murrell Drivers, MD;  Location: [facility];  Service: Orthopedics;  Laterality: Left;  60 MIN   CATARACT EXTRACTION Bilateral    COLONOSCOPY     CORONARY ARTERY BYPASS GRAFT  08/18/2012   Procedure: CORONARY ARTERY BYPASS GRAFTING (CABG);  Surgeon: Dorise MARLA Fellers, MD;  Location: [facility] OR;  Service: Open Heart Surgery;  Laterality: N/A;  CABG x four;  using left internal mammary artery and right leg greater saphenous vein harvested endoscopically   CORONARY STENT INTERVENTION N/A 04/19/2018   Procedure: CORONARY STENT INTERVENTION;  Surgeon: Court Dorn PARAS, MD;  [facility]: [facility];  Service: Cardiovascular;  Laterality: N/A;   DECOMPRESSIVE LUMBAR LAMINECTOMY LEVEL 4 N/A 03/24/2024   Procedure: DECOMPRESSIVE LUMBAR LAMINECTOMY  LEVEL 4;  Surgeon: Beuford Anes, MD;  Location: [facility];  Service: Orthopedics;  Laterality: N/A;  LUMBAR 2- LUMBAR 3, LUMBAR 3- LUMBAR 4, LUMBAR 4- LUMBAR 5, LUMBAR 5 - SACRUM1 DECOMPRESSION AND FUSION   ENDARTERECTOMY Right 09/04/2023   Procedure: ENDARTERECTOMY CAROTID;  Surgeon: Serene Gaile ORN, MD;  Location: [facility] OR;  Service: Vascular;  Laterality: Right;   ENDARTERECTOMY FEMORAL Left 02/17/2022   Procedure: ENDARTERECTOMY FEMORAL;  Surgeon: Gretta Lonni PARAS, MD;  Location: [facility];  Service: Vascular;  Laterality: Left;   INTRAOPERATIVE ARTERIOGRAM Left 02/17/2022   Procedure: INTRA OPERATIVE ARTERIOGRAM;  Surgeon: Gretta Lonni PARAS, MD;  [facility];  Service: Vascular;  Laterality: Left;   KNEE ARTHROSCOPY     RIGHT   LEFT HEART CATH AND CORS/GRAFTS ANGIOGRAPHY N/A 04/19/2018   Procedure: LEFT HEART CATH AND CORS/GRAFTS ANGIOGRAPHY;  Surgeon: Court Dorn PARAS, MD;  [facility];  Service: Cardiovascular;  Laterality: N/A;   LUMBAR LAMINECTOMY/DECOMPRESSION MICRODISCECTOMY N/A 12/05/2015   Procedure: LUMBAR 2-3, LUMBAR 3-4 DECOMPRESSION ;  Surgeon: Anes Beuford, MD;  Location: [facility];  Service: Orthopedics;  Laterality: N/A;   MASS EXCISION Left 12/07/2023   Procedure: LEFT PALM MASS EXCISION;  Surgeon: Murrell Drivers, MD;  Location: [facility];  Service: Orthopedics;  Laterality: Left;   PATCH ANGIOPLASTY Left 02/17/2022   Procedure: PATCH ANGIOPLASTY Left Common Femoral Artery.;  Surgeon: Gretta Lonni PARAS, MD;  Location: [facility] OR;  Service: Vascular;  Laterality: Left;   PERIPHERAL VASCULAR ATHERECTOMY  02/13/2022   Procedure: PERIPHERAL VASCULAR ATHERECTOMY;  Surgeon: Court Dorn PARAS, MD;  [facility]: [facility];  Service: Cardiovascular;;  Rt SFA   PERIPHERAL VASCULAR INTERVENTION Left 08/28/2022   Procedure: PERIPHERAL VASCULAR INTERVENTION;  Surgeon: Gretta Lonni PARAS, MD;  [facility]: [facility];  Service: Cardiovascular;   Laterality: Left;  SFA/POP   Right long finger extensor tendon repair     THROMBECTOMY FEMORAL ARTERY Left 02/17/2022   Procedure: Repair of Left COMMON FEMORAL PSEUDOANEURYSM.;  Surgeon: Gretta Lonni PARAS, MD;  Location: [facility];  Service: Vascular;  Laterality: Left;   TRANSFORAMINAL LUMBAR INTERBODY FUSION (TLIF) WITH PEDICLE SCREW FIXATION 2 LEVEL Left 03/24/2024   Procedure: TRANSFORAMINAL LUMBAR INTERBODY FUSION (TLIF) WITH PEDICLE SCREW FIXATION 2 LEVEL;  Surgeon: Beuford Anes, MD;  Location: [facility];  Service: Orthopedics;  Laterality: Left;  LEFT-SIDED LUMBAR 4- LUMBAR 5, LUMBAR 5 - SACRUM 1 TRANSFORAMINAL LUMBAR INTERBODY FUSION WITH INSTRUMENTATION AND ALLOGRAFT   Current Outpatient Medications on File Prior to Visit  Medication Sig Dispense Refill   acetaminophen  (TYLENOL ) 500 MG tablet Take 1,000 mg by mouth every 6 (six) hours as needed for mild pain.     amLODipine  (NORVASC ) 10 MG tablet Take 1 tablet (10 mg total) by mouth daily. (  Patient taking differently: Take 10 mg by mouth at bedtime.) 90 tablet 3   aspirin  EC 81 MG tablet Take 1 tablet (81 mg total) by mouth daily. 90 tablet 3   azelastine  (ASTELIN ) 0.1 % nasal spray Place 2 sprays into both nostrils 2 (two) times daily. Use in each nostril as directed (Patient taking differently: Place 2 sprays into both nostrils daily. Use in each nostril as directed) 30 mL 12   Bacillus Coagulans-Inulin (PROBIOTIC-PREBIOTIC PO) Take 1 capsule by mouth daily.     cetirizine  (ZYRTEC ) 10 MG tablet Take 1 tablet (10 mg total) by mouth daily. (Patient taking differently: Take 10 mg by mouth at bedtime.) 30 tablet 11   clopidogrel  (PLAVIX ) 75 MG tablet TAKE ONE TABLET (75 MG TOTAL) BY MOUTH DAILY. 90 tablet 3   losartan -hydrochlorothiazide  (HYZAAR) 100-12.5 MG tablet TAKE ONE TABLET BY MOUTH DAILY. EMERGENCY FILL 90 tablet 2   methocarbamol  (ROBAXIN ) 500 MG tablet Take 500 mg by mouth every 8 (eight) hours as needed for muscle spasms.      methocarbamol  (ROBAXIN ) 500 MG tablet Take 1-2 tablets (500-1,000 mg total) by mouth every 6 (six) hours as needed for muscle spasms. 60 tablet 0   pantoprazole  (PROTONIX ) 40 MG tablet TAKE ONE TABLET (40 MG TOTAL) BY MOUTH DAILY. 90 tablet 2   REPATHA  SURECLICK 140 MG/ML SOAJ INJECT ONE ML INTO THE SKIN EVERY 14 DAYS. 6 mL 3   nitroGLYCERIN  (NITROSTAT ) 0.4 MG SL tablet Place 1 tablet (0.4 mg total) under the tongue every 5 (five) minutes as needed for chest pain. 25 tablet 3   No current facility-administered medications on file prior to visit.   Allergies  Allergen Reactions   Statins Other (See Comments)    Bone and muscle pain   Crestor  [Rosuvastatin ]     myalgias   Flonase  [Fluticasone ]     Nose bleeding    Lipitor [Atorvastatin]     myalgias   Oxycodone  Nausea And Vomiting   Hydrocodone  Nausea And Vomiting   Social History   Socioeconomic History   Marital status: Married    Spouse name: Not on file   Number of children: 3   Years of education: Not on file   Highest education level: Not on file  Occupational History   Not on file  Tobacco Use   Smoking status: Every Day    Current packs/day: 1.00    Average packs/day: 1 pack/day for 43.0 years (43.0 ttl pk-yrs)    Types: Cigarettes    Passive exposure: Current (Wife)   Smokeless tobacco: Never   Tobacco comments:    Pt still smoking 1 PPD 09/02/2022 PAP  Vaping Use   Vaping status: Never Used  Substance and Sexual Activity   Alcohol  use: Not Currently    Comment: pt quit drinking around June 2024   Drug use: No   Sexual activity: Yes  Other Topics Concern   Not on file  Social History Narrative   Not on file   Social Drivers of Health   Financial Resource Strain: Not on file  Food Insecurity: No Food Insecurity (09/04/2023)   Hunger Vital Sign    Worried About Running Out of Food in the Last Year: Never true    Ran Out of Food in the Last Year: Never true  Transportation Needs: No Transportation Needs  (09/04/2023)   PRAPARE - Administrator, Civil Service (Medical): No    Lack of Transportation (Non-Medical): No  Physical Activity: Not on  file  Stress: Not on file  Social Connections: Unknown (03/11/2022)   Received from [facility]   Social Network    Social Network: Not on file  Intimate Partner Violence: Not At Risk (09/04/2023)   Humiliation, Afraid, Rape, and Kick questionnaire    Fear of Current or Ex-Partner: No    Emotionally Abused: No    Physically Abused: No    Sexually Abused: No      Review of Systems  Musculoskeletal:  Positive for back pain.  All other systems reviewed and are negative.      Objective:   Physical Exam Vitals reviewed.  Constitutional:      General: He is not in acute distress.    Appearance: Normal appearance. He is well-developed. He is obese. He is not ill-appearing or toxic-appearing.  HENT:     Right Ear: Decreased hearing noted. Tympanic membrane is not injected, scarred, perforated or erythematous.     Left Ear: Decreased hearing noted. Tympanic membrane is perforated. Tympanic membrane is not erythematous or retracted. Tympanic membrane has decreased mobility.     Ears:   Cardiovascular:     Rate and Rhythm: Normal rate and regular rhythm.     Heart sounds: Normal heart sounds.  Pulmonary:     Effort: Pulmonary effort is normal. No respiratory distress.     Breath sounds: Normal breath sounds. No wheezing or rales.  Abdominal:     Palpations: Abdomen is not rigid.  Musculoskeletal:     Lumbar back: Tenderness present. No swelling, edema, deformity, spasms or bony tenderness. Decreased range of motion. Negative right straight leg raise test and negative left straight leg raise test.       Back:  Skin:    Findings: No erythema.  Neurological:     Mental Status: He is alert.           Assessment & Plan:  Acute right-sided low back pain without sciatica I suspect the patient likely strained a muscle in his  lower back.  He denies any hematuria or dysuria.  Movement causes the pain.  Therefore I do not feel that this is a kidney stone or a bladder infection.  He denies any fevers or chills.  He denies any tenderness to palpation in his abdomen so I do not feel that there is any sign of an intra-abdominal infection.  I will treat the patient as a back strain with a prednisone  taper pack coupled with Dilaudid  for pain.  If worsening over the weekend seek medical attention.  However reassess next week.  Consider imaging of the back if the pain worsens

## 2024-07-23 ENCOUNTER — Encounter (HOSPITAL_COMMUNITY): Payer: Self-pay

## 2024-07-23 ENCOUNTER — Ambulatory Visit (HOSPITAL_COMMUNITY): Admission: EM | Admit: 2024-07-23 | Discharge: 2024-07-23 | Disposition: A | Discharge status: Home / Self Care

## 2024-07-23 ENCOUNTER — Ambulatory Visit (INDEPENDENT_AMBULATORY_CARE_PROVIDER_SITE_OTHER)

## 2024-07-23 DIAGNOSIS — K5900 Constipation, unspecified: Secondary | ICD-10-CM | POA: Diagnosis not present

## 2024-07-23 DIAGNOSIS — M545 Low back pain, unspecified: Secondary | ICD-10-CM | POA: Diagnosis not present

## 2024-07-23 LAB — POCT URINALYSIS DIP (MANUAL ENTRY)
Bilirubin, UA: NEGATIVE
Blood, UA: NEGATIVE
Glucose, UA: NEGATIVE mg/dL
Ketones, POC UA: NEGATIVE mg/dL
Leukocytes, UA: NEGATIVE
Nitrite, UA: NEGATIVE
Spec Grav, UA: 1.02 (ref 1.010–1.025)
Urobilinogen, UA: 0.2 U/dL
pH, UA: 6 (ref 5.0–8.0)

## 2024-07-23 MED ORDER — KETOROLAC TROMETHAMINE 60 MG/2ML IM SOLN
60.0000 mg | Freq: Once | INTRAMUSCULAR | Status: AC
  Administered 2024-07-23: 60 mg via INTRAMUSCULAR

## 2024-07-23 MED ORDER — DICLOFENAC SODIUM 75 MG PO TBEC: 75.0000 mg | DELAYED_RELEASE_TABLET | Freq: Two times a day (BID) | ORAL | 0 refills | Status: DC

## 2024-07-23 MED ORDER — KETOROLAC TROMETHAMINE 60 MG/2ML IM SOLN
INTRAMUSCULAR | Status: AC
  Filled 2024-07-23: qty 2

## 2024-07-23 MED ORDER — BACLOFEN 10 MG PO TABS: 10.0000 mg | ORAL_TABLET | Freq: Three times a day (TID) | ORAL | 0 refills | Status: AC

## 2024-07-23 NOTE — ED Provider Notes (Signed)
 [facility]  Note:  This document was prepared using Conservation officer, historic buildings and may include unintentional dictation errors.  MRN: [id number] DOB: 1956/04/14  Subjective:   [name] is a 68 y.o. male presenting for sharp bilateral lower back pain with radiation to bilateral lower abdomen x 2 days.  Patient states that he was seen on Thursday by primary care provider for back pain and was prescribed prednisone  with no relief.  Patient reports having back surgery in May and denies any significant problems since then.  Patient states that the pressure and pain feels like he needs to have a bowel movement but he has been slightly constipated.  Patient denies any falls or injuries recently.  Patient reports that last bowel movement was a very small amount this morning.  Patient has tried both enemas and magnesium  citrate at home with minimal improvement.  Patient also reports that he was given oral Dilaudid  by his PCP to help him sleep due to the pain.  Patient reports he has taken 2 doses total.  No current facility-administered medications for this encounter.  Current Outpatient Medications:    acetaminophen  (TYLENOL ) 500 MG tablet, Take 1,000 mg by mouth every 6 (six) hours as needed for mild pain., Disp: , Rfl:    amLODipine  (NORVASC ) 10 MG tablet, Take 1 tablet (10 mg total) by mouth daily. (Patient taking differently: Take 10 mg by mouth at bedtime.), Disp: 90 tablet, Rfl: 3   aspirin  EC 81 MG tablet, Take 1 tablet (81 mg total) by mouth daily., Disp: 90 tablet, Rfl: 3   azelastine  (ASTELIN ) 0.1 % nasal spray, Place 2 sprays into both nostrils 2 (two) times daily. Use in each nostril as directed (Patient taking differently: Place 2 sprays into both nostrils daily. Use in each nostril as directed), Disp: 30 mL, Rfl: 12   Bacillus Coagulans-Inulin (PROBIOTIC-PREBIOTIC PO), Take 1 capsule by mouth daily., Disp: , Rfl:    baclofen (LIORESAL) 10 MG tablet, Take 1  tablet (10 mg total) by mouth 3 (three) times daily., Disp: 30 each, Rfl: 0   cetirizine  (ZYRTEC ) 10 MG tablet, Take 1 tablet (10 mg total) by mouth daily. (Patient taking differently: Take 10 mg by mouth at bedtime.), Disp: 30 tablet, Rfl: 11   clopidogrel  (PLAVIX ) 75 MG tablet, TAKE ONE TABLET (75 MG TOTAL) BY MOUTH DAILY., Disp: 90 tablet, Rfl: 3   diclofenac (VOLTAREN) 75 MG EC tablet, Take 1 tablet (75 mg total) by mouth 2 (two) times daily., Disp: 30 tablet, Rfl: 0   HYDROmorphone  (DILAUDID ) 2 MG tablet, Take 1 tablet (2 mg total) by mouth every 6 (six) hours as needed for severe pain (pain score 7-10)., Disp: 30 tablet, Rfl: 0   losartan -hydrochlorothiazide  (HYZAAR) 100-12.5 MG tablet, TAKE ONE TABLET BY MOUTH DAILY. EMERGENCY FILL, Disp: 90 tablet, Rfl: 2   methocarbamol  (ROBAXIN ) 500 MG tablet, Take 500 mg by mouth every 8 (eight) hours as needed for muscle spasms., Disp: , Rfl:    methocarbamol  (ROBAXIN ) 500 MG tablet, Take 1-2 tablets (500-1,000 mg total) by mouth every 6 (six) hours as needed for muscle spasms., Disp: 60 tablet, Rfl: 0   pantoprazole  (PROTONIX ) 40 MG tablet, TAKE ONE TABLET (40 MG TOTAL) BY MOUTH DAILY., Disp: 90 tablet, Rfl: 2   predniSONE  (DELTASONE ) 20 MG tablet, 3 tabs poqday 1-2, 2 tabs poqday 3-4, 1 tab poqday 5-6, Disp: 12 tablet, Rfl: 0   REPATHA  SURECLICK 140 MG/ML SOAJ, INJECT ONE ML INTO THE SKIN EVERY 14  DAYS., Disp: 6 mL, Rfl: 3   nitroGLYCERIN  (NITROSTAT ) 0.4 MG SL tablet, Place 1 tablet (0.4 mg total) under the tongue every 5 (five) minutes as needed for chest pain., Disp: 25 tablet, Rfl: 3   Allergies  Allergen Reactions   Statins Other (See Comments)    Bone and muscle pain   Crestor  [Rosuvastatin ]     myalgias   Flonase  [Fluticasone ]     Nose bleeding    Lipitor [Atorvastatin]     myalgias   Oxycodone  Nausea And Vomiting   Hydrocodone  Nausea And Vomiting    Past Medical History:  Diagnosis Date   Arthritis    Cervical spine ankylosis     CKD (chronic kidney disease) stage 3, GFR 30-59 ml/min (HCC)    COLONIC POLYPS, HX OF    CORONARY ARTERY DISEASE    a. s/p stent to RCA 2005. b. Stent to Cx 2007. c.  CABG X4 2014; d. LHC 04/19/18 occluded SVG-Circ, DES to in-stent restenosis of circ.    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/16/2022   SLEEP APNEA    on CPAP   Statin intolerance    Tobacco abuse    Vertebral artery occlusion, left    Warthin's tumor    left     Past Surgical History:  Procedure Laterality Date   ABDOMINAL AORTOGRAM W/LOWER EXTREMITY N/A 02/13/2022   Procedure: ABDOMINAL AORTOGRAM W/LOWER EXTREMITY;  Surgeon: Court Dorn PARAS, MD;  [facility]: [facility];  Service: Cardiovascular;  Laterality: N/A;   ABDOMINAL AORTOGRAM W/LOWER EXTREMITY N/A 08/28/2022   Procedure: ABDOMINAL AORTOGRAM W/LOWER EXTREMITY;  Surgeon: Gretta Lonni PARAS, MD;  Location: [facility];  Service: Cardiovascular;  Laterality: N/A;   ACHILLES TENDON REPAIR     ANTERIOR LAT LUMBAR FUSION Left 03/23/2024   Procedure: LEFT-SIDED LUMBAR TWO - LUMBAR THREE, LUMBAR THREE - LUMBAR FOUR LATERAL INTERBODY FUSION WITH INSTRUMENTATION AND ALLOGRAFT;  Surgeon: Beuford Anes, MD;  Location: [facility];  Service: Orthopedics;  Laterality: Left;  LEFT-SIDED LUMBAR 2 - LUMBAR 3, LUMBAR 3- LUMBAR 4 LATERAL INTERBODY FUSION WITH INSTRUMENTATION AND ALLOGRAFT   BACK SURGERY     lower back   CARDIAC CATHETERIZATION     CARPAL TUNNEL RELEASE Left 12/07/2023   Procedure: LEFT CARPAL TUNNEL RELEASE;  Surgeon: Murrell Drivers, MD;  Location: [facility];  Service: Orthopedics;  Laterality: Left;  60 MIN   CATARACT EXTRACTION Bilateral    COLONOSCOPY     CORONARY ARTERY BYPASS GRAFT  08/18/2012   Procedure: CORONARY ARTERY BYPASS GRAFTING (CABG);  Surgeon: Dorise MARLA Fellers, MD;  Location: [facility];  Service: Open Heart Surgery;  Laterality: N/A;  CABG x four;  using left internal mammary artery and right leg greater saphenous vein harvested endoscopically   CORONARY STENT INTERVENTION N/A 04/19/2018   Procedure: CORONARY STENT INTERVENTION;  Surgeon: Court Dorn PARAS, MD;  [facility];  Service: Cardiovascular;  Laterality: N/A;   DECOMPRESSIVE LUMBAR LAMINECTOMY LEVEL 4 N/A 03/24/2024   Procedure: DECOMPRESSIVE  LUMBAR LAMINECTOMY LEVEL 4;  Surgeon: Beuford Anes, MD;  Location: [facility];  Service: Orthopedics;  Laterality: N/A;  LUMBAR 2- LUMBAR 3, LUMBAR 3- LUMBAR 4, LUMBAR 4- LUMBAR 5, LUMBAR 5 - SACRUM1 DECOMPRESSION AND FUSION   ENDARTERECTOMY Right 09/04/2023   Procedure: ENDARTERECTOMY CAROTID;  Surgeon: Serene Gaile ORN, MD;  Location: [facility] OR;  Service: Vascular;  Laterality: Right;   ENDARTERECTOMY FEMORAL Left 02/17/2022   Procedure: ENDARTERECTOMY FEMORAL;  Surgeon: Gretta Lonni PARAS, MD;  [facility];  Service: Vascular;  Laterality: Left;   INTRAOPERATIVE ARTERIOGRAM Left 02/17/2022   Procedure: INTRA OPERATIVE ARTERIOGRAM;  Surgeon: Gretta Lonni PARAS, MD;  Location: [facility] OR;  Service: Vascular;  Laterality: Left;   KNEE ARTHROSCOPY     RIGHT   LEFT HEART CATH AND CORS/GRAFTS ANGIOGRAPHY N/A 04/19/2018   Procedure: LEFT HEART CATH AND CORS/GRAFTS ANGIOGRAPHY;  Surgeon: Court Dorn PARAS, MD;  [facility];  Service: Cardiovascular;  Laterality: N/A;   LUMBAR LAMINECTOMY/DECOMPRESSION MICRODISCECTOMY N/A 12/05/2015   Procedure: LUMBAR 2-3, LUMBAR 3-4 DECOMPRESSION ;  Surgeon: Anes Beuford, MD;  [facility];  Service: Orthopedics;  Laterality: N/A;   MASS EXCISION Left 12/07/2023    Procedure: LEFT PALM MASS EXCISION;  Surgeon: Murrell Drivers, MD;  Location: [facility];  Service: Orthopedics;  Laterality: Left;   PATCH ANGIOPLASTY Left 02/17/2022   Procedure: PATCH ANGIOPLASTY Left Common Femoral Artery.;  Surgeon: Gretta Lonni PARAS, MD;  Location: [facility] OR;  Service: Vascular;  Laterality: Left;   PERIPHERAL VASCULAR ATHERECTOMY  02/13/2022   Procedure: PERIPHERAL VASCULAR ATHERECTOMY;  Surgeon: Court Dorn PARAS, MD;  [facility];  Service: Cardiovascular;;  Rt SFA   PERIPHERAL VASCULAR INTERVENTION Left 08/28/2022   Procedure: PERIPHERAL VASCULAR INTERVENTION;  Surgeon: Gretta Lonni PARAS, MD;  [facility];  Service: Cardiovascular;  Laterality: Left;  SFA/POP   Right long finger extensor tendon repair     THROMBECTOMY FEMORAL ARTERY Left 02/17/2022   Procedure: Repair of Left COMMON FEMORAL PSEUDOANEURYSM.;  Surgeon: Gretta Lonni PARAS, MD;  [facility];  Service: Vascular;  Laterality: Left;   TRANSFORAMINAL LUMBAR INTERBODY FUSION (TLIF) WITH PEDICLE SCREW FIXATION 2 LEVEL Left 03/24/2024   Procedure: TRANSFORAMINAL LUMBAR INTERBODY FUSION (TLIF) WITH PEDICLE SCREW FIXATION 2 LEVEL;  Surgeon: Beuford Anes, MD;  [facility];  Service: Orthopedics;  Laterality: Left;  LEFT-SIDED LUMBAR 4- LUMBAR 5, LUMBAR 5 - SACRUM 1 TRANSFORAMINAL LUMBAR INTERBODY FUSION WITH INSTRUMENTATION AND ALLOGRAFT    Family History  Problem Relation Age of Onset   Cancer Mother    Heart disease Brother     Social History   Tobacco Use   Smoking status: Every Day    Current packs/day: 1.00    Average packs/day: 1 pack/day for 43.0 years (43.0 ttl pk-yrs)    Types: Cigarettes    Passive exposure: Current (Wife)   Smokeless tobacco: Never   Tobacco comments:    Pt still smoking 1 PPD 09/02/2022 PAP  Vaping Use   Vaping status: Never Used  Substance Use Topics   Alcohol  use: Not Currently    Comment: pt quit drinking around  June 2024   Drug use: No    ROS Refer to HPI for ROS details.  Objective:    Vitals: BP 134/71 (BP Location: Left Arm)   Pulse 73   Temp 97.6 F (36.4 C) (Oral)   Resp 19   SpO2 96%   Physical Exam Vitals and nursing note  reviewed.  Constitutional:      General: He is not in acute distress.    Appearance: Normal appearance. He is well-developed. He is not ill-appearing or toxic-appearing.  HENT:     Head: Normocephalic.  Cardiovascular:     Rate and Rhythm: Normal rate.  Pulmonary:     Effort: Pulmonary effort is normal. No respiratory distress.  Abdominal:     Palpations: Abdomen is soft.     Tenderness: There is abdominal tenderness in the right lower quadrant and left lower quadrant. There is no right CVA tenderness, left CVA tenderness, guarding or rebound.  Musculoskeletal:     Lumbar back: Spasms and tenderness present. No swelling, signs of trauma or bony tenderness. Decreased range of motion. Negative right straight leg raise test and negative left straight leg raise test.  Skin:    General: Skin is warm and dry.  Neurological:     General: No focal deficit present.     Mental Status: He is alert and oriented to person, place, and time.  Psychiatric:        Mood and Affect: Mood normal.        Behavior: Behavior normal.     Procedures  Results for orders placed or performed during the hospital encounter of 07/23/24 (from the past 24 hours)  POC urinalysis dipstick     Status: Abnormal   Collection Time: 07/23/24  4:12 PM  Result Value Ref Range   Color, UA straw (A) yellow   Clarity, UA clear clear   Glucose, UA negative negative mg/dL   Bilirubin, UA negative negative   Ketones, POC UA negative negative mg/dL   Spec Grav, UA 8.979 8.989 - 1.025   Blood, UA negative negative   pH, UA 6.0 5.0 - 8.0   Protein Ur, POC trace (A) negative mg/dL   Urobilinogen, UA 0.2 0.2 or 1.0 E.U./dL   Nitrite, UA Negative Negative   Leukocytes, UA Negative Negative     Assessment and Plan :     Discharge Instructions       1. Acute low back pain without sciatica, unspecified back pain laterality (Primary) - DG Abd 1 View x-ray completed in UC shows no abnormal gas pattern, moderate stool burden, otherwise normal abdominal x-ray. - POC urinalysis dipstick completed in UC shows trace protein, no leukocytes, no nitrite, no blood, these findings are not indicative of urinary tract infection - ketorolac (TORADOL) injection 60 mg given in UC for acute musculoskeletal pain. - diclofenac (VOLTAREN) 75 MG EC tablet; Take 1 tablet (75 mg total) by mouth 2 (two) times daily.  Dispense: 30 tablet; Refill: 0 - baclofen (LIORESAL) 10 MG tablet; Take 1 tablet (10 mg total) by mouth 3 (three) times daily.  Dispense: 30 each; Refill: 0 - Due to current manifestation of symptoms that are beyond the level of care provided by urgent care recommendation to follow-up in emergency department was given. - Symptoms require advanced imaging, stat laboratory testing and potentially IV medications for treatment which are unavailable in urgent care. -Continue to monitor symptoms for any change in severity if there is any escalation of current symptoms or development of new symptoms follow-up in ER for further evaluation and management.      Sabina Beavers B Adylynn Hertenstein   Tasha Jindra, [place], [place] 07/23/24 [id number]

## 2024-07-23 NOTE — ED Triage Notes (Addendum)
 Sharp low back and abdominal pain onset 2 days ago. Was seen Thursday for back pain and given prednisone  as well as a pain med with no relief.   Patient states the sensation is similar to needing to have a bowel movement. Having limited movements. No recent falls or injuries. Did have back surgery back in May.   Last BM was this morning but a small amount. Had an enema at home afterwards with no relief and took otc magnesium  citrate.

## 2024-07-23 NOTE — Discharge Instructions (Signed)
  1. Acute low back pain without sciatica, unspecified back pain laterality (Primary) - DG Abd 1 View x-ray completed in UC shows no abnormal gas pattern, moderate stool burden, otherwise normal abdominal x-ray. - POC urinalysis dipstick completed in UC shows trace protein, no leukocytes, no nitrite, no blood, these findings are not indicative of urinary tract infection - ketorolac (TORADOL) injection 60 mg given in UC for acute musculoskeletal pain. - diclofenac (VOLTAREN) 75 MG EC tablet; Take 1 tablet (75 mg total) by mouth 2 (two) times daily.  Dispense: 30 tablet; Refill: 0 - baclofen (LIORESAL) 10 MG tablet; Take 1 tablet (10 mg total) by mouth 3 (three) times daily.  Dispense: 30 each; Refill: 0 - Due to current manifestation of symptoms that are beyond the level of care provided by urgent care recommendation to follow-up in emergency department was given. - Symptoms require advanced imaging, stat laboratory testing and potentially IV medications for treatment which are unavailable in urgent care. -Continue to monitor symptoms for any change in severity if there is any escalation of current symptoms or development of new symptoms follow-up in ER for further evaluation and management.

## 2024-07-27 ENCOUNTER — Other Ambulatory Visit: Payer: Self-pay | Admitting: Cardiovascular Disease

## 2024-08-01 ENCOUNTER — Ambulatory Visit (HOSPITAL_BASED_OUTPATIENT_CLINIC_OR_DEPARTMENT_OTHER)
Admission: RE | Admit: 2024-08-01 | Discharge: 2024-08-01 | Disposition: A | Source: Ambulatory Visit | Attending: Surgery | Admitting: Surgery

## 2024-08-01 ENCOUNTER — Ambulatory Visit: Attending: Surgery | Admitting: Physician Assistant

## 2024-08-01 ENCOUNTER — Ambulatory Visit (HOSPITAL_COMMUNITY)
Admission: RE | Admit: 2024-08-01 | Discharge: 2024-08-01 | Disposition: A | Discharge status: Home / Self Care | Source: Ambulatory Visit | Attending: Surgery | Admitting: Surgery

## 2024-08-01 VITALS — BP 147/75 | HR 67 | Temp 98.4°F | Wt 216.9 lb

## 2024-08-01 DIAGNOSIS — I6522 Occlusion and stenosis of left carotid artery: Secondary | ICD-10-CM | POA: Diagnosis not present

## 2024-08-01 DIAGNOSIS — F17200 Nicotine dependence, unspecified, uncomplicated: Secondary | ICD-10-CM | POA: Diagnosis not present

## 2024-08-01 DIAGNOSIS — I6521 Occlusion and stenosis of right carotid artery: Secondary | ICD-10-CM

## 2024-08-01 DIAGNOSIS — I739 Peripheral vascular disease, unspecified: Secondary | ICD-10-CM

## 2024-08-01 DIAGNOSIS — I6523 Occlusion and stenosis of bilateral carotid arteries: Secondary | ICD-10-CM | POA: Diagnosis present

## 2024-08-01 LAB — VAS US ABI WITH/WO TBI
Left ABI: 0.39
Right ABI: 0.73

## 2024-08-01 NOTE — Progress Notes (Signed)
 HISTORY AND PHYSICAL     CC:  follow up. Requesting Provider:  Duanne Butler DASEN, MD  HPI: This is a 68 y.o. male who is here today for follow up.  Pt has hx of right carotid endarterectomy with bovine patch angioplasty by Dr. 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   His left ICA is occluded.   Patient also has known occluded stents of the left SFA.  Pt has hx of CLI with rest pain and recently underwent angiogram via right CFA with left SFA and above knee popliteal artery angioplasty with stent placement and angioplasty of left behind the knee popliteal artery on 08/28/2022 by Dr. Gretta.  He underwent atherectomy followed by [facility] mid right SFA on 02/13/2022 by Dr. Court.   He also has hx of repair left CFA psa with endarterectomy with bovine patch angioplasty on 02/17/2022 also by Dr. Gretta.    His left SFA/popliteal stent known to be occluded.   Pt was last seen on 09/28/2023.  At that time, he was not having any neurological sx.  He was not having any claudication.    The pt returns today for follow up.    Pt denies any amaurosis fugax, speech difficulties, weakness, numbness, paralysis or clumsiness or facial droop.    Pt denies claudication, rest pain, or non healing wounds.    He states he had back surgery earlier this year.  He had to go to the urgent care recently as he felt he had been overdoing the PT.  He is feeling better currently.    He continues to smoke.   The pt is on a statin for cholesterol management.   Repatha  The pt is on an aspirin .    Other AC:  plavix  The pt is on CCB, ARB, diuretic for hypertension.  The pt is not on medication for diabetes. Tobacco hx:  current  Pt does not have family hx of AAA.    Past Medical History:  Diagnosis Date   Arthritis    Cervical spine ankylosis    CKD (chronic kidney disease) stage 3, GFR 30-59 ml/min (HCC)    COLONIC POLYPS, HX OF    CORONARY ARTERY DISEASE    a. s/p stent to RCA  2005. b. Stent to Cx 2007. c.  CABG X4 2014; d. LHC 04/19/18 occluded SVG-Circ, DES to in-stent restenosis of circ.    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/16/2022   SLEEP APNEA    on CPAP   Statin intolerance    Tobacco abuse    Vertebral artery occlusion, left    Warthin's tumor    left    Past Surgical History:  Procedure Laterality Date   ABDOMINAL AORTOGRAM W/LOWER EXTREMITY N/A 02/13/2022   Procedure: ABDOMINAL AORTOGRAM W/LOWER EXTREMITY;  Surgeon: Court Dorn PARAS, MD;  Location: [facility];  Service: Cardiovascular;  Laterality: N/A;   ABDOMINAL AORTOGRAM W/LOWER EXTREMITY N/A 08/28/2022   Procedure: ABDOMINAL AORTOGRAM W/LOWER EXTREMITY;  Surgeon: Gretta Lonni PARAS, MD;  Location: [facility];  Service: Cardiovascular;  Laterality: N/A;   ACHILLES TENDON REPAIR     ANTERIOR LAT LUMBAR FUSION Left 03/23/2024   Procedure: LEFT-SIDED LUMBAR TWO - LUMBAR THREE, LUMBAR THREE - LUMBAR FOUR LATERAL INTERBODY FUSION WITH INSTRUMENTATION AND ALLOGRAFT;  Surgeon: Beuford Anes, MD;  Location: [facility];  Service: Orthopedics;  Laterality: Left;  LEFT-SIDED LUMBAR 2 - LUMBAR 3, LUMBAR 3- LUMBAR 4 LATERAL INTERBODY FUSION WITH INSTRUMENTATION AND ALLOGRAFT   BACK SURGERY     lower back   CARDIAC CATHETERIZATION     CARPAL TUNNEL RELEASE Left 12/07/2023   Procedure: LEFT CARPAL TUNNEL RELEASE;  Surgeon: Murrell Drivers, MD;  Location: [facility];  Service: Orthopedics;  Laterality: Left;  60 MIN   CATARACT EXTRACTION Bilateral    COLONOSCOPY     CORONARY ARTERY  BYPASS GRAFT  08/18/2012   Procedure: CORONARY ARTERY BYPASS GRAFTING (CABG);  Surgeon: Dorise MARLA Fellers, MD;  Location: [facility] OR;  Service: Open Heart Surgery;  Laterality: N/A;  CABG x four;  using left internal mammary artery and right leg greater saphenous vein harvested endoscopically   CORONARY STENT INTERVENTION N/A 04/19/2018   Procedure: CORONARY STENT INTERVENTION;  Surgeon: Court Dorn PARAS, MD;  [facility]: [facility];  Service: Cardiovascular;  Laterality: N/A;   DECOMPRESSIVE LUMBAR LAMINECTOMY LEVEL 4 N/A 03/24/2024   Procedure: DECOMPRESSIVE LUMBAR LAMINECTOMY LEVEL 4;  Surgeon: Beuford Anes, MD;  Location: [facility];  Service: Orthopedics;  Laterality: N/A;  LUMBAR 2- LUMBAR 3, LUMBAR 3- LUMBAR 4, LUMBAR 4- LUMBAR 5, LUMBAR 5 - SACRUM1 DECOMPRESSION AND FUSION   ENDARTERECTOMY Right 09/04/2023   Procedure: ENDARTERECTOMY CAROTID;  Surgeon: Serene Gaile ORN, MD;  Location: [facility] OR;  Service: Vascular;  Laterality: Right;   ENDARTERECTOMY FEMORAL Left 02/17/2022   Procedure: ENDARTERECTOMY FEMORAL;  Surgeon: Gretta Lonni PARAS, MD;  Location: [facility] OR;  Service: Vascular;  Laterality: Left;   INTRAOPERATIVE ARTERIOGRAM Left 02/17/2022   Procedure: INTRA OPERATIVE ARTERIOGRAM;  Surgeon: Gretta Lonni PARAS, MD;  Location: [facility] OR;  Service: Vascular;  Laterality: Left;   KNEE ARTHROSCOPY     RIGHT   LEFT HEART CATH AND CORS/GRAFTS ANGIOGRAPHY N/A 04/19/2018   Procedure: LEFT HEART CATH AND CORS/GRAFTS ANGIOGRAPHY;  Surgeon: Court Dorn PARAS, MD;  [facility]: [facility];  Service: Cardiovascular;  Laterality: N/A;   LUMBAR LAMINECTOMY/DECOMPRESSION MICRODISCECTOMY N/A 12/05/2015   Procedure: LUMBAR 2-3, LUMBAR 3-4 DECOMPRESSION ;  Surgeon: Anes Beuford, MD;  Location: [facility];  Service: Orthopedics;  Laterality: N/A;   MASS EXCISION Left 12/07/2023   Procedure: LEFT PALM MASS EXCISION;  Surgeon: Murrell Drivers, MD;  Location: [facility];  Service: Orthopedics;   Laterality: Left;   PATCH ANGIOPLASTY Left 02/17/2022   Procedure: PATCH ANGIOPLASTY Left Common Femoral Artery.;  Surgeon: Gretta Lonni PARAS, MD;  Location: [facility] OR;  Service: Vascular;  Laterality: Left;   PERIPHERAL VASCULAR ATHERECTOMY  02/13/2022   Procedure: PERIPHERAL VASCULAR ATHERECTOMY;  Surgeon: Court Dorn PARAS, MD;  [facility]: [facility];  Service: Cardiovascular;;  Rt SFA   PERIPHERAL VASCULAR INTERVENTION Left 08/28/2022   Procedure: PERIPHERAL VASCULAR INTERVENTION;  Surgeon: Gretta Lonni PARAS, MD;  [facility]: [facility];  Service: Cardiovascular;  Laterality: Left;  SFA/POP   Right long finger extensor tendon repair     THROMBECTOMY FEMORAL ARTERY Left 02/17/2022   Procedure: Repair of Left COMMON FEMORAL PSEUDOANEURYSM.;  Surgeon: Gretta Lonni PARAS, MD;  Location: [facility] OR;  Service: Vascular;  Laterality: Left;   TRANSFORAMINAL LUMBAR INTERBODY FUSION (TLIF) WITH PEDICLE SCREW FIXATION 2 LEVEL Left 03/24/2024   Procedure: TRANSFORAMINAL LUMBAR INTERBODY FUSION (TLIF) WITH PEDICLE SCREW FIXATION 2 LEVEL;  Surgeon: Beuford Anes, MD;  Location: [facility];  Service: Orthopedics;  Laterality: Left;  LEFT-SIDED LUMBAR 4- LUMBAR 5, LUMBAR 5 - SACRUM 1 TRANSFORAMINAL LUMBAR INTERBODY FUSION WITH INSTRUMENTATION AND ALLOGRAFT    Allergies  Allergen Reactions   Statins Other (See Comments)    Bone and muscle pain   Crestor  [Rosuvastatin ]     myalgias   Flonase  [Fluticasone ]     Nose bleeding    Lipitor [Atorvastatin]     myalgias   Oxycodone  Nausea And Vomiting   Hydrocodone  Nausea And Vomiting    Current Outpatient Medications  Medication Sig Dispense Refill   acetaminophen  (TYLENOL ) 500 MG tablet Take 1,000 mg by mouth every 6 (six) hours as needed for mild pain.     amLODipine  (NORVASC ) 10 MG tablet TAKE ONE TABLET (10 MG TOTAL) BY MOUTH DAILY. 90 tablet 3   aspirin  EC 81 MG tablet Take 1 tablet (81 mg total) by mouth daily. 90 tablet 3   azelastine   (ASTELIN ) 0.1 % nasal spray Place 2 sprays into both nostrils 2 (two) times daily. Use in each nostril as directed (Patient taking differently: Place 2 sprays into both nostrils daily. Use in each nostril as directed) 30 mL 12   Bacillus Coagulans-Inulin (PROBIOTIC-PREBIOTIC PO) Take 1 capsule by mouth daily.     baclofen (LIORESAL) 10 MG tablet Take 1 tablet (10 mg total) by mouth 3 (three) times daily. 30 each 0   cetirizine  (ZYRTEC ) 10 MG tablet Take 1 tablet (10 mg total) by mouth daily. (Patient taking differently: Take 10 mg by mouth at bedtime.) 30 tablet 11   clopidogrel  (PLAVIX ) 75 MG tablet TAKE ONE TABLET (75 MG TOTAL) BY MOUTH DAILY. 90 tablet 3   diclofenac (VOLTAREN) 75 MG EC tablet Take 1 tablet (75 mg total) by mouth 2 (two) times daily. 30 tablet 0   HYDROmorphone  (DILAUDID ) 2 MG tablet Take 1 tablet (2 mg total) by mouth every 6 (six) hours as needed for severe pain (pain score 7-10). 30 tablet 0   losartan -hydrochlorothiazide  (HYZAAR) 100-12.5 MG tablet TAKE ONE TABLET BY MOUTH DAILY. EMERGENCY FILL 90 tablet 2   methocarbamol  (ROBAXIN ) 500 MG tablet Take 500 mg by mouth every 8 (eight) hours as needed for muscle spasms.     methocarbamol  (ROBAXIN ) 500 MG tablet Take 1-2 tablets (500-1,000 mg total) by mouth every 6 (six) hours as needed for muscle spasms. 60 tablet 0   nitroGLYCERIN  (NITROSTAT ) 0.4 MG SL tablet Place 1 tablet (0.4 mg total) under the tongue every 5 (five) minutes as needed for chest pain. 25 tablet 3   pantoprazole  (PROTONIX ) 40 MG tablet TAKE ONE TABLET (40 MG TOTAL) BY MOUTH DAILY. 90 tablet 2   predniSONE  (DELTASONE ) 20 MG tablet 3 tabs poqday 1-2, 2 tabs poqday 3-4, 1 tab poqday 5-6 12 tablet 0   REPATHA  SURECLICK 140 MG/ML SOAJ INJECT ONE ML INTO THE SKIN EVERY 14 DAYS. 6 mL 3   No current facility-administered medications for this visit.    Family History  Problem Relation Age of Onset   Cancer Mother    Heart disease Brother     Social History    Socioeconomic History   Marital status: Married    Spouse name: Not on file   Number of children: 3   Years of  education: Not on file   Highest education level: Not on file  Occupational History   Not on file  Tobacco Use   Smoking status: Every Day    Current packs/day: 1.00    Average packs/day: 1 pack/day for 43.0 years (43.0 ttl pk-yrs)    Types: Cigarettes    Passive exposure: Current (Wife)   Smokeless tobacco: Never   Tobacco comments:    Pt still smoking 1 PPD 09/02/2022 PAP  Vaping Use   Vaping status: Never Used  Substance and Sexual Activity   Alcohol  use: Not Currently    Comment: pt quit drinking around June 2024   Drug use: No   Sexual activity: Yes  Other Topics Concern   Not on file  Social History Narrative   Not on file   Social Drivers of Health   Financial Resource Strain: Not on file  Food Insecurity: No Food Insecurity (09/04/2023)   Hunger Vital Sign    Worried About Running Out of Food in the Last Year: Never true    Ran Out of Food in the Last Year: Never true  Transportation Needs: No Transportation Needs (09/04/2023)   PRAPARE - Administrator, Civil Service (Medical): No    Lack of Transportation (Non-Medical): No  Physical Activity: Not on file  Stress: Not on file  Social Connections: Unknown (03/11/2022)   [facility]   Social Network    Social Network: Not on file  Intimate Partner Violence: Not At Risk (09/04/2023)   Humiliation, Afraid, Rape, and Kick questionnaire    Fear of Current or Ex-Partner: No    Emotionally Abused: No    Physically Abused: No    Sexually Abused: No     REVIEW OF SYSTEMS:   [X]  denotes positive finding, [ ]  denotes negative finding Cardiac  Comments:  Chest pain or chest pressure:    Shortness of breath upon exertion:    Short of breath when lying flat:    Irregular heart rhythm:        Vascular    Pain in calf, thigh, or hip brought on by ambulation:    Pain in feet  at night that wakes you up from your sleep:     Blood clot in your veins:    Leg swelling:         Pulmonary    Oxygen at home:    Wheezing:         Neurologic    Sudden weakness in arms or legs:     Sudden numbness in arms or legs:     Sudden onset of difficulty speaking or understanding others    Temporary loss of vision in one eye:     Problems with dizziness:         Gastrointestinal    Blood in stool:     Vomited blood:         Genitourinary    Burning when urinating:     Blood in urine:        Psychiatric    Major depression:         Hematologic    Bleeding problems:    Problems with blood clotting too easily:        Skin    Rashes or ulcers:        Constitutional    Fever or chills:      PHYSICAL EXAMINATION:  Today's Vitals   08/01/24 0948 08/01/24 0952 08/01/24 0953  BP: ROLLEN)  164/83 (!) 145/78 (!) 147/75  Pulse: 76 73 67  Temp: 98.4 F (36.9 C)    TempSrc: Temporal    Weight: 216 lb 14.4 oz (98.4 kg)     Body mass index is 32.03 kg/m.   General:  WDWN in NAD; vital signs documented above Gait: Not observed HENT: WNL, normocephalic Pulmonary: normal non-labored breathing  Cardiac: regular HR;  without carotid bruits Abdomen: soft, NT, aortic pulse is not palpable Skin: without rashes Vascular Exam/Pulses: Easily palpable bilateral radial pulses Bilateral femoral pulses 2+ brisk biphasic left PT  monophasic left AT/peroneal/PT Extremities: no ulcerations present Musculoskeletal: no muscle wasting or atrophy  Neurologic: A&O X 3;  speech is fluent/normal; moving all extremities equally  Psychiatric:  The pt has Normal affect.   Non-Invasive Vascular Imaging:   ABI's/TBI's on 08/01/2024: Right:  0.73/0.49 - great toe pressure:  74 Left:  0.39/0.23 - great toe pressure:  35  Carotid Duplex on 08/01/2024: Right:  1-39% ICA stenosis Left:  occluded  Previous ABI's/TBI's on 07/07/2023: Right:  0.83/0.38 - great toe pressure:  48 Left:   0.43/0.21 - great toe pressure:  27  Previous Carotid duplex on 07/17/2023: Right: 80-99% ICA stenosis Left:   occluded    ASSESSMENT/PLAN:: 68 y.o. male here for follow up for PAD and carotid stenosis and has hx of right carotid endarterectomy with bovine patch angioplasty by Dr. 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   His left ICA is occluded.   Patient also has known occluded stents of the left SFA.    PAD -pt does not have rest pain, claudication, non healing wounds.  He does have some neuropathy -encouraged him to protect his feet -continue graduated walking program -pt will f/u in one year with ABI  Carotid stenosis -duplex today reveals 1-39% right ICA stenosis and the left is known to be occluded.  -discussed s/s of stroke with pt and he understands should he develop any of these sx, he will go to the nearest ER or call 911. -pt will f/u in one year with carotid duplex  Current smoker -discussed importance of smoking cessation and that smoking puts him at increased risk for limb loss, heart attack, stroke, cancer, pulmonary issues.   -continue asa/plavix .  He has statin allergy.  He is now on Repatha , which he is tolerating quite well.    Lucie Apt, [facility] [phone]  Clinic MD:   Serene

## 2024-08-09 ENCOUNTER — Other Ambulatory Visit

## 2024-08-09 DIAGNOSIS — I10 Essential (primary) hypertension: Secondary | ICD-10-CM

## 2024-08-09 DIAGNOSIS — E785 Hyperlipidemia, unspecified: Secondary | ICD-10-CM

## 2024-08-09 DIAGNOSIS — I2510 Atherosclerotic heart disease of native coronary artery without angina pectoris: Secondary | ICD-10-CM

## 2024-08-09 DIAGNOSIS — E781 Pure hyperglyceridemia: Secondary | ICD-10-CM

## 2024-08-09 DIAGNOSIS — Z0000 Encounter for general adult medical examination without abnormal findings: Secondary | ICD-10-CM

## 2024-08-10 LAB — TESTOSTERONE: Testosterone: 844 ng/dL — ABNORMAL HIGH (ref 250–827)

## 2024-08-11 ENCOUNTER — Ambulatory Visit: Admitting: Family Medicine

## 2024-08-11 ENCOUNTER — Encounter: Payer: Self-pay | Admitting: Family Medicine

## 2024-08-11 ENCOUNTER — Ambulatory Visit: Payer: Self-pay | Admitting: Family Medicine

## 2024-08-11 VITALS — BP 130/72 | HR 82 | Temp 98.5°F | Ht 69.0 in | Wt 217.0 lb

## 2024-08-11 DIAGNOSIS — I2510 Atherosclerotic heart disease of native coronary artery without angina pectoris: Secondary | ICD-10-CM

## 2024-08-11 DIAGNOSIS — Z0001 Encounter for general adult medical examination with abnormal findings: Secondary | ICD-10-CM

## 2024-08-11 DIAGNOSIS — Z720 Tobacco use: Secondary | ICD-10-CM

## 2024-08-11 DIAGNOSIS — Z0000 Encounter for general adult medical examination without abnormal findings: Secondary | ICD-10-CM

## 2024-08-11 DIAGNOSIS — Z23 Encounter for immunization: Secondary | ICD-10-CM

## 2024-08-11 DIAGNOSIS — N1831 Chronic kidney disease, stage 3a: Secondary | ICD-10-CM | POA: Diagnosis not present

## 2024-08-11 DIAGNOSIS — I10 Essential (primary) hypertension: Secondary | ICD-10-CM

## 2024-08-11 DIAGNOSIS — I739 Peripheral vascular disease, unspecified: Secondary | ICD-10-CM | POA: Diagnosis not present

## 2024-08-11 DIAGNOSIS — E7800 Pure hypercholesterolemia, unspecified: Secondary | ICD-10-CM

## 2024-08-11 DIAGNOSIS — Z122 Encounter for screening for malignant neoplasm of respiratory organs: Secondary | ICD-10-CM

## 2024-08-11 MED ORDER — SILDENAFIL CITRATE 100 MG PO TABS: 50.0000 mg | ORAL_TABLET | Freq: Every day | ORAL | 11 refills | Status: AC | PRN

## 2024-08-11 NOTE — Progress Notes (Signed)
 Subjective:    Patient ID: [name], male    DOB: [birth year], 68 y.o.   MRN: [id number]  HPI Patient is a 68 year old patient here today for complete physical exam.  Past medical history significant for coronary artery disease, smoking, and stage III chronic kidney disease.  Has significant vascular history including hx of right carotid endarterectomy with bovine patch angioplasty by Dr. 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   His left ICA is occluded.   Patient also has known occluded stents of the left SFA.  Patient refuses the flu shot.  He is due for the second shingles vaccine.  He is due for COVID and RSV but he declines these as well.  It has been 5 years since his last pneumonia shot.  I recommended he get Capvaxive.  He is due for lung cancer screening.  He request that I schedule a CT scan.  He also request Viagra  for erectile dysfunction.  His testosterone level was normal.  He is not using nitroglycerin .  We discussed the risk of combining nitroglycerin  with Viagra . Immunization History  Administered Date(s) Administered   INFLUENZA, HIGH DOSE SEASONAL PF 09/07/2019   Influenza Split 08/23/2012   Pneumococcal Polysaccharide-23 06/28/2019   Td 02/24/1991   Tdap 02/02/2024   Zoster Recombinant(Shingrix ) 07/14/2023   Most recent labs listed below: Lab on 08/09/2024  Component Date Value Ref Range Status   WBC 08/09/2024 8.6  3.8 - 10.8 Thousand/uL Final   RBC 08/09/2024 4.77  4.20 - 5.80 Million/uL Final   Hemoglobin 08/09/2024 13.6  13.2 - 17.1 g/dL Final   HCT 89/85/7974 40.6  38.5 - 50.0 % Final   MCV 08/09/2024 85.1  80.0 - 100.0 fL Final   MCH 08/09/2024 28.5  27.0 - 33.0 pg Final   MCHC 08/09/2024 33.5  32.0 - 36.0 g/dL Final   Comment: For adults, a slight decrease in the calculated MCHC value (in the range of 30 to 32 g/dL) is most likely not clinically significant; however, it should be interpreted with caution in  correlation with other red cell parameters and the patient's clinical condition.    RDW 08/09/2024 14.2  11.0 - 15.0 % Final   Platelets 08/09/2024 256  140 - 400 Thousand/uL Final   MPV 08/09/2024 9.1  7.5 - 12.5 fL Final   Neutro Abs 08/09/2024 5,289  1,500 - 7,800 cells/uL Final   Absolute Lymphocytes 08/09/2024 1,574  850 - 3,900 cells/uL Final   Absolute Monocytes 08/09/2024 765  200 - 950 cells/uL Final   Eosinophils Absolute 08/09/2024 851 (H)  15 - 500 cells/uL Final   Basophils Absolute 08/09/2024 120  0 - 200 cells/uL Final   Neutrophils Relative % 08/09/2024 61.5  % Final   Total Lymphocyte 08/09/2024 18.3  % Final   Monocytes Relative 08/09/2024 8.9  % Final   Eosinophils Relative 08/09/2024 9.9  % Final   Basophils Relative 08/09/2024 1.4  % Final   Glucose, Bld 08/09/2024 108 (H)  65 - 99 mg/dL Final   Comment: .            Fasting reference interval . For someone without known diabetes, a glucose value between 100 and 125 mg/dL is consistent with prediabetes and should be confirmed with a follow-up test. .    BUN 08/09/2024 19  7 - 25 mg/dL Final   Creat 89/85/7974 1.56 (H)  0.70 - 1.28 mg/dL Final   BUN/Creatinine Ratio 08/09/2024 12  6 - 22 (calc) Final   Sodium 08/09/2024 133 (L)  135 - 146 mmol/L Final   Potassium 08/09/2024 4.3  3.5 - 5.3 mmol/L Final   Chloride 08/09/2024 101  98 - 110 mmol/L Final   CO2 08/09/2024 25  20 - 32 mmol/L Final   Calcium  08/09/2024 9.2  8.6 - 10.3 mg/dL Final   Total Protein 89/85/7974 6.7  6.1 - 8.1 g/dL Final   Albumin  08/09/2024 4.2  3.6 - 5.1 g/dL Final   Globulin 89/85/7974 2.5  1.9 - 3.7 g/dL (calc) Final   AG Ratio 08/09/2024 1.7  1.0 - 2.5 (calc) Final   Total Bilirubin 08/09/2024 0.4  0.2 - 1.2 mg/dL Final   Alkaline phosphatase (APISO) 08/09/2024 62  35 - 144 U/L Final   AST 08/09/2024 15  10 - 35 U/L Final   ALT 08/09/2024 12  9 - 46 U/L Final   Cholesterol 08/09/2024 103  <200 mg/dL Final   HDL 89/85/7974 43  >  OR = 40 mg/dL Final   Triglycerides 89/85/7974 104  <150 mg/dL Final   LDL Cholesterol (Calc) 08/09/2024 41  mg/dL (calc) Final   Comment: Reference range: <100 . Desirable range <100 mg/dL for primary prevention;   <70 mg/dL for patients with CHD or diabetic patients  with > or = 2 CHD risk factors. SABRA LDL-C is now calculated using the Martin-Hopkins  calculation, which is a validated novel method providing  better accuracy than the Friedewald equation in the  estimation of LDL-C.  Gladis APPLETHWAITE et al. SANDREA. 7986;689(80): 2061-2068  (http://education.[employer].com/faq/FAQ164)    Total CHOL/HDL Ratio 08/09/2024 2.4  <4.9 (calc) Final   Non-HDL Cholesterol (Calc) 08/09/2024 60  <130 mg/dL (calc) Final   Comment: For patients with diabetes plus 1 major ASCVD risk  factor, treating to a non-HDL-C goal of <100 mg/dL  (LDL-C of <29 mg/dL) is considered a therapeutic  option.    Testosterone 08/09/2024 844 (H)  250 - 827 ng/dL Final  Hospital Outpatient Visit on 08/01/2024  Component Date Value Ref Range Status   Right ABI 08/01/2024 0.73   Final   Left ABI 08/01/2024 0.39   Final  Admission on 07/23/2024, Discharged on 07/23/2024  Component Date Value Ref Range Status   Color, UA 07/23/2024 straw (A)  yellow Final   Clarity, UA 07/23/2024 clear  clear Final   Glucose, UA 07/23/2024 negative  negative mg/dL Final   Bilirubin, UA 90/72/7974 negative  negative Final   Ketones, POC UA 07/23/2024 negative  negative mg/dL Final   Spec Grav, UA 90/72/7974 1.020  1.010 - 1.025 Final   Blood, UA 07/23/2024 negative  negative Final   pH, UA 07/23/2024 6.0  5.0 - 8.0 Final   Protein Ur, POC 07/23/2024 trace (A)  negative mg/dL Final   Urobilinogen, UA 07/23/2024 0.2  0.2 or 1.0 E.U./dL Final   Nitrite, UA 90/72/7974 Negative  Negative Final   Leukocytes, UA 07/23/2024 Negative  Negative Final     Past Medical History:  Diagnosis Date   Arthritis    Cervical spine ankylosis    CKD  (chronic kidney disease) stage 3, GFR 30-59 ml/min (HCC)    COLONIC POLYPS, HX OF    CORONARY ARTERY DISEASE    a. s/p stent to RCA 2005. b. Stent to Cx 2007. c.  CABG X4 2014; d. LHC 04/19/18 occluded SVG-Circ, DES to in-stent restenosis of circ.    COVID-19 2020   Diverticulosis    DIVERTICULOSIS, COLON  DYSPHAGIA UNSPECIFIED    intermittent   GERD    HEMORRHOIDS    History of kidney stones    HYPERLIPIDEMIA    HYPERTENSION    HYPERTRIGLYCERIDEMIA    Left carotid artery occlusion    OBESITY    Peripheral vascular disease    Right Carotid Stenosis s/p carotids endarterectomy   Postoperative atrial fibrillation (HCC) 2014   Pseudoaneurysm of left femoral artery (HCC) 02/16/2022   SLEEP APNEA    on CPAP   Statin intolerance    Tobacco abuse    Vertebral artery occlusion, left    Warthin's tumor    left   Past Surgical History:  Procedure Laterality Date   ABDOMINAL AORTOGRAM W/LOWER EXTREMITY N/A 02/13/2022   Procedure: ABDOMINAL AORTOGRAM W/LOWER EXTREMITY;  Surgeon: Court Dorn PARAS, MD;  Location: [facility];  Service: Cardiovascular;  Laterality: N/A;   ABDOMINAL AORTOGRAM W/LOWER EXTREMITY N/A 08/28/2022   Procedure: ABDOMINAL AORTOGRAM W/LOWER EXTREMITY;  Surgeon: Gretta Lonni PARAS, MD;  Location: [facility];  Service: Cardiovascular;  Laterality: N/A;   ACHILLES TENDON REPAIR     ANTERIOR LAT LUMBAR FUSION Left 03/23/2024   Procedure: LEFT-SIDED LUMBAR TWO - LUMBAR THREE, LUMBAR THREE - LUMBAR FOUR LATERAL INTERBODY FUSION WITH INSTRUMENTATION AND ALLOGRAFT;  Surgeon: Beuford Anes, MD;  Location: [facility];  Service: Orthopedics;  Laterality: Left;  LEFT-SIDED LUMBAR 2 - LUMBAR 3, LUMBAR 3- LUMBAR 4 LATERAL INTERBODY FUSION WITH INSTRUMENTATION AND ALLOGRAFT   BACK SURGERY     lower back   CARDIAC CATHETERIZATION     CARPAL TUNNEL RELEASE Left 12/07/2023   Procedure: LEFT CARPAL TUNNEL RELEASE;  Surgeon: Murrell Drivers, MD;  Location: [facility];  Service: Orthopedics;  Laterality: Left;  60 MIN   CATARACT EXTRACTION Bilateral    COLONOSCOPY     CORONARY ARTERY BYPASS GRAFT  08/18/2012   Procedure: CORONARY ARTERY BYPASS GRAFTING (CABG);  Surgeon: Dorise MARLA Fellers, MD;  Location: [facility] OR;  Service: Open Heart Surgery;  Laterality: N/A;  CABG x four;  using left internal mammary artery and right leg greater saphenous vein harvested endoscopically   CORONARY STENT INTERVENTION N/A 04/19/2018   Procedure: CORONARY STENT INTERVENTION;  Surgeon: Court Dorn PARAS, MD;  [facility]: [facility];  Service: Cardiovascular;  Laterality: N/A;   DECOMPRESSIVE LUMBAR LAMINECTOMY LEVEL 4 N/A 03/24/2024   Procedure: DECOMPRESSIVE LUMBAR LAMINECTOMY LEVEL 4;  Surgeon: Beuford Anes, MD;  Location: [facility];  Service: Orthopedics;  Laterality: N/A;  LUMBAR 2- LUMBAR 3, LUMBAR 3- LUMBAR 4, LUMBAR 4- LUMBAR 5, LUMBAR 5 - SACRUM1 DECOMPRESSION AND FUSION   ENDARTERECTOMY Right 09/04/2023   Procedure: ENDARTERECTOMY CAROTID;  Surgeon: Serene Gaile ORN, MD;  Location: [facility] OR;  Service: Vascular;  Laterality: Right;   ENDARTERECTOMY FEMORAL Left 02/17/2022   Procedure: ENDARTERECTOMY FEMORAL;  Surgeon: Gretta Lonni PARAS, MD;  Location: [facility];  Service: Vascular;  Laterality: Left;   INTRAOPERATIVE ARTERIOGRAM Left 02/17/2022   Procedure: INTRA OPERATIVE ARTERIOGRAM;  Surgeon: Gretta Lonni PARAS, MD;  Location: [facility] OR;  Service: Vascular;  Laterality: Left;   KNEE ARTHROSCOPY     RIGHT   LEFT HEART CATH AND CORS/GRAFTS ANGIOGRAPHY N/A 04/19/2018   Procedure: LEFT HEART CATH AND CORS/GRAFTS ANGIOGRAPHY;  Surgeon: Court Dorn PARAS, MD;  [facility]: [facility];  Service: Cardiovascular;  Laterality: N/A;   LUMBAR LAMINECTOMY/DECOMPRESSION MICRODISCECTOMY N/A 12/05/2015   Procedure: LUMBAR 2-3, LUMBAR 3-4 DECOMPRESSION ;  Surgeon: Anes Beuford, MD;  Location: [facility]  OR;  Service: Orthopedics;  Laterality: N/A;   MASS EXCISION Left 12/07/2023    Procedure: LEFT PALM MASS EXCISION;  Surgeon: Murrell Drivers, MD;  Location: [facility];  Service: Orthopedics;  Laterality: Left;   PATCH ANGIOPLASTY Left 02/17/2022   Procedure: PATCH ANGIOPLASTY Left Common Femoral Artery.;  Surgeon: Gretta Lonni PARAS, MD;  Location: [facility] OR;  Service: Vascular;  Laterality: Left;   PERIPHERAL VASCULAR ATHERECTOMY  02/13/2022   Procedure: PERIPHERAL VASCULAR ATHERECTOMY;  Surgeon: Court Dorn PARAS, MD;  [facility];  Service: Cardiovascular;;  Rt SFA   PERIPHERAL VASCULAR INTERVENTION Left 08/28/2022   Procedure: PERIPHERAL VASCULAR INTERVENTION;  Surgeon: Gretta Lonni PARAS, MD;  [facility]: [facility];  Service: Cardiovascular;  Laterality: Left;  SFA/POP   Right long finger extensor tendon repair     THROMBECTOMY FEMORAL ARTERY Left 02/17/2022   Procedure: Repair of Left COMMON FEMORAL PSEUDOANEURYSM.;  Surgeon: Gretta Lonni PARAS, MD;  Location: [facility];  Service: Vascular;  Laterality: Left;   TRANSFORAMINAL LUMBAR INTERBODY FUSION (TLIF) WITH PEDICLE SCREW FIXATION 2 LEVEL Left 03/24/2024   Procedure: TRANSFORAMINAL LUMBAR INTERBODY FUSION (TLIF) WITH PEDICLE SCREW FIXATION 2 LEVEL;  Surgeon: Beuford Anes, MD;  Location: [facility];  Service: Orthopedics;  Laterality: Left;  LEFT-SIDED LUMBAR 4- LUMBAR 5, LUMBAR 5 - SACRUM 1 TRANSFORAMINAL LUMBAR INTERBODY FUSION WITH INSTRUMENTATION AND ALLOGRAFT   Current Outpatient Medications on File Prior to Visit  Medication Sig Dispense Refill   acetaminophen  (TYLENOL ) 500 MG tablet Take 1,000 mg by mouth every 6 (six) hours as needed for mild pain.     amLODipine  (NORVASC ) 10 MG tablet TAKE ONE TABLET (10 MG TOTAL) BY MOUTH DAILY. 90 tablet 3   aspirin  EC 81 MG tablet Take 1 tablet (81 mg total) by mouth daily. 90 tablet 3   azelastine  (ASTELIN ) 0.1 % nasal spray Place 2 sprays into both nostrils 2 (two) times daily. Use in each nostril as directed (Patient taking differently:  Place 2 sprays into both nostrils daily. Use in each nostril as directed) 30 mL 12   Bacillus Coagulans-Inulin (PROBIOTIC-PREBIOTIC PO) Take 1 capsule by mouth daily.     baclofen (LIORESAL) 10 MG tablet Take 1 tablet (10 mg total) by mouth 3 (three) times daily. 30 each 0   cetirizine  (ZYRTEC ) 10 MG tablet Take 1 tablet (10 mg total) by mouth daily. (Patient taking differently: Take 10 mg by mouth at bedtime.) 30 tablet 11   clopidogrel  (PLAVIX ) 75 MG tablet TAKE ONE TABLET (75 MG TOTAL) BY MOUTH DAILY. 90 tablet 3   diclofenac (VOLTAREN) 75 MG EC tablet Take 1 tablet (75 mg total) by mouth 2 (two) times daily. 30 tablet 0   HYDROmorphone  (DILAUDID ) 2 MG tablet Take 1 tablet (2 mg total) by mouth every 6 (six) hours as needed for severe pain (pain score 7-10). 30 tablet 0   losartan -hydrochlorothiazide  (HYZAAR) 100-12.5 MG tablet TAKE ONE TABLET BY MOUTH DAILY. EMERGENCY FILL 90 tablet 2   methocarbamol  (ROBAXIN ) 500 MG tablet Take 500 mg by mouth every 8 (eight) hours as needed for muscle spasms.     methocarbamol  (ROBAXIN ) 500 MG tablet Take 1-2 tablets (500-1,000 mg total) by mouth every 6 (six) hours as needed for muscle spasms. 60 tablet 0   nitroGLYCERIN  (NITROSTAT ) 0.4 MG SL tablet Place 1 tablet (0.4 mg total) under the tongue every 5 (five) minutes as needed for chest pain. 25 tablet 3   pantoprazole  (PROTONIX ) 40 MG tablet TAKE  ONE TABLET (40 MG TOTAL) BY MOUTH DAILY. 90 tablet 2   predniSONE  (DELTASONE ) 20 MG tablet 3 tabs poqday 1-2, 2 tabs poqday 3-4, 1 tab poqday 5-6 12 tablet 0   REPATHA  SURECLICK 140 MG/ML SOAJ INJECT ONE ML INTO THE SKIN EVERY 14 DAYS. 6 mL 3   No current facility-administered medications on file prior to visit.   Allergies  Allergen Reactions   Statins Other (See Comments)    Bone and muscle pain   Crestor  [Rosuvastatin ]     myalgias   Flonase  [Fluticasone ]     Nose bleeding    Lipitor [Atorvastatin]     myalgias   Oxycodone  Nausea And Vomiting    Hydrocodone  Nausea And Vomiting   Social History   Socioeconomic History   Marital status: Married    Spouse name: Not on file   Number of children: 3   Years of education: Not on file   Highest education level: Not on file  Occupational History   Not on file  Tobacco Use   Smoking status: Every Day    Current packs/day: 1.00    Average packs/day: 1 pack/day for 43.0 years (43.0 ttl pk-yrs)    Types: Cigarettes    Passive exposure: Current (Wife)   Smokeless tobacco: Never   Tobacco comments:    Pt still smoking 1 PPD 09/02/2022 PAP  Vaping Use   Vaping status: Never Used  Substance and Sexual Activity   Alcohol  use: Not Currently    Comment: pt quit drinking around June 2024   Drug use: No   Sexual activity: Yes  Other Topics Concern   Not on file  Social History Narrative   Not on file   Social Drivers of Health   Financial Resource Strain: Not on file  Food Insecurity: No Food Insecurity (09/04/2023)   Hunger Vital Sign    Worried About Running Out of Food in the Last Year: Never true    Ran Out of Food in the Last Year: Never true  Transportation Needs: No Transportation Needs (09/04/2023)   PRAPARE - Administrator, Civil Service (Medical): No    Lack of Transportation (Non-Medical): No  Physical Activity: Not on file  Stress: Not on file  Social Connections: Unknown (03/11/2022)   Received from [facility]   Social Network    Social Network: Not on file  Intimate Partner Violence: Not At Risk (09/04/2023)   Humiliation, Afraid, Rape, and Kick questionnaire    Fear of Current or Ex-Partner: No    Emotionally Abused: No    Physically Abused: No    Sexually Abused: No      Review of Systems  All other systems reviewed and are negative.      Objective:   Physical Exam Vitals reviewed.  Constitutional:      General: He is not in acute distress.    Appearance: Normal appearance. He is well-developed and normal weight. He is not  ill-appearing, toxic-appearing or diaphoretic.  HENT:     Head: Normocephalic and atraumatic.     Right Ear: Tympanic membrane normal.     Left Ear: Tympanic membrane normal.     Nose: No congestion or rhinorrhea.     Mouth/Throat:     Mouth: Mucous membranes are moist.     Pharynx: Oropharynx is clear. No oropharyngeal exudate or posterior oropharyngeal erythema.  Eyes:     Extraocular Movements: Extraocular movements intact.     Conjunctiva/sclera: Conjunctivae normal.  Pupils: Pupils are equal, round, and reactive to light.  Neck:     Vascular: Carotid bruit present.  Cardiovascular:     Rate and Rhythm: Normal rate and regular rhythm.     Pulses: Normal pulses.     Heart sounds: Normal heart sounds.     No friction rub. No gallop.  Pulmonary:     Effort: Pulmonary effort is normal. No respiratory distress.     Breath sounds: Normal breath sounds. No wheezing or rales.  Abdominal:     General: Bowel sounds are normal. There is no distension.     Palpations: Abdomen is soft. Abdomen is not rigid.     Tenderness: There is no abdominal tenderness. There is no guarding or rebound. Negative signs include Murphy's sign and McBurney's sign.  Musculoskeletal:        General: No swelling, tenderness, deformity or signs of injury.     Right lower leg: No edema.     Left lower leg: No edema.  Skin:    Coloration: Skin is not jaundiced or pale.     Findings: No bruising, erythema, lesion or rash.  Neurological:     General: No focal deficit present.     Mental Status: He is alert and oriented to person, place, and time. Mental status is at baseline.     Cranial Nerves: No cranial nerve deficit.     Sensory: No sensory deficit.     Motor: No weakness.     Coordination: Coordination normal.     Gait: Gait normal.     Deep Tendon Reflexes: Reflexes normal.  Psychiatric:        Mood and Affect: Mood normal.        Behavior: Behavior normal.        Thought Content: Thought content  normal.        Judgment: Judgment normal.           Assessment & Plan:  CKD stage 3a, GFR 45-59 ml/min (HCC) - Plan: Protein / Creatinine Ratio, Urine  Screening for lung cancer - Plan: CT CHEST LUNG CA SCREEN LOW DOSE W/O CM  General medical exam  PAD (peripheral artery disease)  ASCVD (arteriosclerotic cardiovascular disease)  Tobacco abuse  Pure hypercholesterolemia  Benign essential HTN Blood pressure today is excellent.  LDL cholesterol is less than 55 which is his goal.  The patient has stable stage IIIa chronic kidney disease.  I will check a urine protein creatinine ratio and if significantly elevated consider Farxiga.  Continue to strongly recommend smoking cessation.  Colonoscopy is up-to-date.  I will check a PSA.  Gave the patient a prescription for Viagra  and explained that he cannot take that within 24 hours of nitroglycerin .  Patient is not using nitroglycerin .  Schedule the patient for CT scan to screen for lung cancer.  Patient received the second shingles vaccine.  He declines, COVID, RSV.  Also recommended the new pneumonia vaccine

## 2024-08-12 ENCOUNTER — Other Ambulatory Visit: Payer: Self-pay

## 2024-08-12 ENCOUNTER — Ambulatory Visit: Payer: Self-pay | Admitting: Family Medicine

## 2024-08-12 LAB — COMPLETE METABOLIC PANEL WITHOUT GFR
AG Ratio: 1.7 (calc) (ref 1.0–2.5)
ALT: 12 U/L (ref 9–46)
AST: 15 U/L (ref 10–35)
Albumin: 4.2 g/dL (ref 3.6–5.1)
Alkaline phosphatase (APISO): 62 U/L (ref 35–144)
BUN/Creatinine Ratio: 12 (calc) (ref 6–22)
BUN: 19 mg/dL (ref 7–25)
CO2: 25 mmol/L (ref 20–32)
Calcium: 9.2 mg/dL (ref 8.6–10.3)
Chloride: 101 mmol/L (ref 98–110)
Creat: 1.56 mg/dL — ABNORMAL HIGH (ref 0.70–1.28)
Globulin: 2.5 g/dL (ref 1.9–3.7)
Glucose, Bld: 108 mg/dL — ABNORMAL HIGH (ref 65–99)
Potassium: 4.3 mmol/L (ref 3.5–5.3)
Sodium: 133 mmol/L — ABNORMAL LOW (ref 135–146)
Total Bilirubin: 0.4 mg/dL (ref 0.2–1.2)
Total Protein: 6.7 g/dL (ref 6.1–8.1)

## 2024-08-12 LAB — CBC WITH DIFFERENTIAL/PLATELET
Absolute Lymphocytes: 1574 {cells}/uL (ref 850–3900)
Absolute Monocytes: 765 {cells}/uL (ref 200–950)
Basophils Absolute: 120 {cells}/uL (ref 0–200)
Basophils Relative: 1.4 %
Eosinophils Absolute: 851 {cells}/uL — ABNORMAL HIGH (ref 15–500)
Eosinophils Relative: 9.9 %
HCT: 40.6 % (ref 38.5–50.0)
Hemoglobin: 13.6 g/dL (ref 13.2–17.1)
MCH: 28.5 pg (ref 27.0–33.0)
MCHC: 33.5 g/dL (ref 32.0–36.0)
MCV: 85.1 fL (ref 80.0–100.0)
MPV: 9.1 fL (ref 7.5–12.5)
Monocytes Relative: 8.9 %
Neutro Abs: 5289 {cells}/uL (ref 1500–7800)
Neutrophils Relative %: 61.5 %
Platelets: 256 Thousand/uL (ref 140–400)
RBC: 4.77 Million/uL (ref 4.20–5.80)
RDW: 14.2 % (ref 11.0–15.0)
Total Lymphocyte: 18.3 %
WBC: 8.6 Thousand/uL (ref 3.8–10.8)

## 2024-08-12 LAB — PROTEIN / CREATININE RATIO, URINE
Creatinine, Urine: 64 mg/dL (ref 20–320)
Protein/Creat Ratio: 172 mg/g{creat} — ABNORMAL HIGH (ref 25–148)
Protein/Creatinine Ratio: 0.172 mg/mg{creat} — ABNORMAL HIGH (ref 0.025–0.148)
Total Protein, Urine: 11 mg/dL (ref 5–25)

## 2024-08-12 LAB — LIPID PANEL
Cholesterol: 103 mg/dL (ref <200)
HDL: 43 mg/dL (ref >=40)
LDL Cholesterol (Calc): 41 mg/dL
Non-HDL Cholesterol (Calc): 60 mg/dL (ref <130)
Total CHOL/HDL Ratio: 2.4 (calc) (ref <5.0)
Triglycerides: 104 mg/dL (ref <150)

## 2024-08-12 LAB — TEST AUTHORIZATION

## 2024-08-12 LAB — PSA: PSA: 3.58 ng/mL (ref <=4.00)

## 2024-08-12 MED ORDER — DAPAGLIFLOZIN PROPANEDIOL 10 MG PO TABS: 10.0000 mg | ORAL_TABLET | Freq: Every day | ORAL | 3 refills | Status: AC

## 2024-08-15 ENCOUNTER — Ambulatory Visit: Payer: Self-pay

## 2024-08-15 NOTE — Progress Notes (Unsigned)
 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 Complicated by HTN, AFib, CAD, L Carotid Artery Occlusion, Chronic Bronchitis, GERD, CKD3, Cervical Ankylosis, Hypertriglyceridemia, Covid infection  06/2020,  NPSG 07/30/1999- AHI 56/hr, desaturation to 85%, body weight 195 lbs PFT 08/16/12- minimal obstruction, Nl DLCO ===================================================    08/17/23- 67 yoM Smoker ( 1.5 ppd/ 43 pkyrs) followed  for Obstructive sleep apnea.(DOT/CDL)  Complicated by HTN, AFib, CAD/ stent, L Carotid Artery Occlusion,  PAD/femoral stents, Chronic Bronchitis, GERD, CKD3, Cervical Ankylosis, Hypertriglyceridemia, Covid infection  06/2020, L Parotid Mass, Chronic Rhinitis,  CPAP- auto 5-15 cwp/ [employer]      AirSense 10 AutoSet  replaced 2023 Download- compliance 100%, AHI 1.8/hr Body weight today 212 lbs Declined flu vax Pending carotid endarterectomy Pending bx L parotid mass Download reviewed- doing very well. Comfortable with CPAP- no concerns. CT chest low dose 07/28/23- IMPRESSION: 1. Lung-RADS 2, benign appearance or behavior. Continue annual screening with low-dose chest CT without contrast in 12 months. 2. Aortic Atherosclerosis (ICD10-I70.0) and Emphysema (ICD10-J43.9).  08/16/24-68 yoM Smoker ( 1.5 ppd/ 43 pkyrs) followed  for Obstructive sleep apnea.(DOT/CDL)  Complicated by HTN, AFib, CAD/ stent, L Carotid Artery Occlusion,  PAD/femoral stents, Chronic Bronchitis, GERD, CKD3, Cervical Ankylosis, Hypertriglyceridemia, Covid infection  06/2020, L Parotid Mass, Chronic Rhinitis, Lumbar Spine Surgery, CPAP- auto 5-15 cwp/ [employer]      AirSense 10 AutoSet  replaced 2023 Download- compliance 97%, A/HI 1.2/hr Body weight today 215 Discussed the use of AI scribe software for clinical note transcription with the patient, who gave verbal consent to proceed.  History of Present Illness   [name] is a 68 year old male  with OSA on CPAP, who  presents with back pain following surgery and physical therapy. He continues to benefit from CPAP and is doing well.  His back pain began after physical therapy, resulting in muscle spasms and restricted movement. He received an injection at [facility], which provided temporary relief. He experiences difficulty with activities such as swimming and bending over. He is not currently on pain medications but uses Tylenol  with some improvement. He can sleep but has difficulty changing positions in bed. He discontinued Jardiance after experiencing adverse effects. He has received flu and shingles vaccinations in the past. He indicates he is able to achieve comfortable sleep positions.     Assessment and Plan:    Chronic low back pain with muscle spasm Chronic low back pain worsened by physical therapy, causing muscle spasm. Relief achieved with prior injection. Currently using acetaminophen  as needed. Functional limitations persist. - Continue acetaminophen  as needed for pain management.  Obstructive sleep apnea on CPAP therapy Obstructive sleep apnea effectively managed with CPAP. - Continue CPAP therapy auto 5-15     ROS-see HPI  + = positive Constitutional:   No-   weight loss, night sweats, fevers, chills, fatigue, lassitude. HEENT:   No-  headaches, difficulty swallowing, tooth/dental problems, sore throat,       No-  sneezing, itching, ear ache, nasal congestion, post nasal drip,  CV:  No-   chest pain, orthopnea, PND, swelling in lower extremities, anasarca, dizziness, palpitations Resp: No-   shortness of breath with exertion or at rest.              No-   productive cough,  No non-productive cough,  No- coughing up of blood.              No-   change  in color of mucus.  No- wheezing.   Skin: No-   rash or lesions. GI:  No-   heartburn, indigestion, abdominal pain, nausea, vomiting,  GU: . MS:  No-   joint pain or swelling.  Neuro-     nothing unusual Psych:  No- change in  mood or affect. No depression or anxiety.  No memory loss.  OBJ- Physical Exam General- Alert, Oriented, Affect-appropriate, Distress- none acute, medium build Skin- rash-none, lesions- none, excoriation- none Lymphadenopathy- none Head- atraumatic, ? Fullness L parotid, not tender            Eyes- Gross vision intact, PERRLA, conjunctivae and secretions clear            Ears- Hearing, canals-normal            Nose- Clear, no-Septal dev, mucus, polyps, erosion, perforation             Throat- Mallampati IV , mucosa clear , drainage- none, tonsils- atrophic Neck- flexible , trachea midline, no stridor , thyroid nl, carotid no bruit Chest - symmetrical excursion , unlabored           Heart/CV- RRR , no murmur , no gallop  , no rub, nl s1 s2                           - JVD- none , edema- none, stasis changes- none, varices- none           Lung- clear to P&A, wheeze- none, cough- none , dullness-none, rub- none           Chest wall-  Abd-  Br/ Gen/ Rectal- Not done, not indicated Extrem- cyanosis- none, clubbing, none, atrophy- none, strength- nl Neuro- grossly intact to observation

## 2024-08-15 NOTE — Telephone Encounter (Signed)
 Patient requesting call back advising what to do, requesting you leave a message and put on mychart as well. He states he will not take the Jardiance. Skipped it today and is asymptomatic at this time.  FYI Only or Action Required?: Action required by provider: clinical question for provider.  Patient was last seen in primary care on 08/11/2024 by Duanne Butler DASEN, MD.  Called Nurse Triage reporting Medication Reaction.  Symptoms began several days ago.  Interventions attempted: Other: Skipped Jardiance today.  Symptoms are: completely resolved.  Triage Disposition: No disposition on file.  Patient/caregiver understands and will follow disposition?:   Answer Assessment - Initial Assessment Questions Pt is on Jardiance for CKD, not DM. States he started it on Friday and has been feeling cold and dizzy since. Symptoms start when he takes it and then last all day.   1. NAME of MEDICINE: What medicine(s) are you calling about?     Jardiance  2. QUESTION: What is your question? (e.g., double dose of medicine, side effect)     Pt reports dizziness and feeling very cold after taking Jardiance.  Protocols used: Medication Question Call-A-AH

## 2024-08-15 NOTE — Telephone Encounter (Signed)
 Patient disconnected prior to transfer. Attempted to contact patient back but there was no answer. Unable to leave voicemail. Routing for further attempts.      Copied from CRM #[id number]. Topic: Clinical - Red Word Triage >> Aug 15, 2024 12:11 PM Antwanette L wrote: Red Word that prompted transfer to Nurse Triage: The patient reports experiencing dizziness and feeling extremely cold (freezing) since starting Jardiance

## 2024-08-16 ENCOUNTER — Encounter: Payer: Self-pay | Admitting: Internal Medicine

## 2024-08-16 ENCOUNTER — Ambulatory Visit: Payer: BC Managed Care – PPO | Admitting: Internal Medicine

## 2024-08-16 VITALS — BP 136/62 | HR 93 | Temp 98.6°F | Ht 69.0 in | Wt 215.8 lb

## 2024-08-16 DIAGNOSIS — M62838 Other muscle spasm: Secondary | ICD-10-CM | POA: Diagnosis not present

## 2024-08-16 DIAGNOSIS — M545 Low back pain, unspecified: Secondary | ICD-10-CM | POA: Diagnosis not present

## 2024-08-16 DIAGNOSIS — G8929 Other chronic pain: Secondary | ICD-10-CM | POA: Diagnosis not present

## 2024-08-16 DIAGNOSIS — G4733 Obstructive sleep apnea (adult) (pediatric): Secondary | ICD-10-CM | POA: Diagnosis not present

## 2024-08-16 DIAGNOSIS — F17210 Nicotine dependence, cigarettes, uncomplicated: Secondary | ICD-10-CM

## 2024-08-16 NOTE — Patient Instructions (Signed)
 I hope your back gets better quickly now.  You are doing well with CPAP and we can continue auto 5-15

## 2024-08-16 NOTE — Telephone Encounter (Signed)
Reason for Disposition . [1] Caller has NON-URGENT medicine question about med that PCP prescribed AND [2] triager unable to answer question  Protocols used: Medication Question Call-A-AH  

## 2024-08-18 ENCOUNTER — Ambulatory Visit: Admitting: Family Medicine

## 2024-08-18 ENCOUNTER — Encounter: Payer: Self-pay | Admitting: Family Medicine

## 2024-08-18 ENCOUNTER — Ambulatory Visit
Admission: RE | Admit: 2024-08-18 | Discharge: 2024-08-18 | Disposition: A | Discharge status: Home / Self Care | Source: Ambulatory Visit | Attending: Family Medicine | Admitting: Family Medicine

## 2024-08-18 VITALS — BP 132/62 | HR 81 | Temp 97.7°F | Ht 69.0 in | Wt 214.6 lb

## 2024-08-18 DIAGNOSIS — R6883 Chills (without fever): Secondary | ICD-10-CM

## 2024-08-18 DIAGNOSIS — R509 Fever, unspecified: Secondary | ICD-10-CM

## 2024-08-18 LAB — URINALYSIS, ROUTINE W REFLEX MICROSCOPIC
Bacteria, UA: NONE SEEN /HPF
Bilirubin Urine: NEGATIVE
Glucose, UA: NEGATIVE
Hyaline Cast: NONE SEEN /LPF
Ketones, ur: NEGATIVE
Leukocytes,Ua: NEGATIVE
Nitrite: NEGATIVE
Specific Gravity, Urine: 1.01 (ref 1.001–1.035)
WBC, UA: NONE SEEN /HPF (ref 0–5)
pH: 6 (ref 5.0–8.0)

## 2024-08-18 LAB — MICROSCOPIC MESSAGE

## 2024-08-18 MED ORDER — AMOXICILLIN-POT CLAVULANATE 875-125 MG PO TABS: 1.0000 | ORAL_TABLET | Freq: Two times a day (BID) | ORAL | 0 refills | Status: AC

## 2024-08-18 NOTE — Progress Notes (Signed)
 Subjective:    Patient ID: [name], male    DOB: 1956-03-21, 68 y.o.   MRN: [id number]  HPI Had CPE 10/16 with labs that were relatively normal.  Patient had back surgery in May.  He has not had any issues after the surgery.  The surgical site on his lower back and on his left flank appear well-healed.  There is no tenderness to palpation around that.  He denies any back pain.  Starting 2 weeks ago he developed chills and fever every afternoon around 2 or 3:00.  He states that he feels extremely weak and tired however he really has no other symptoms.  Specifically he denies any cough.  He denies any pleurisy.  He denies any purulent sputum or hemoptysis.  He denies any chest pain.  He denies any nausea or vomiting or diarrhea.  He had 1 episode of melanotic stool earlier this week and he also has tenderness to palpation in the left lower quadrant although it is not severe.  He denies any dysuria or urgency or frequency.  Urinalysis today shows trace blood but negative nitrates and negative leukocyte esterase.  I performed a prostate exam.  Prostate was nonswollen and was not tender.  He denies any sore throat or sinus pain or headaches other symptoms of an upper respiratory infection.  He denies any rash.  He denies any recent tick bites.  He denies any joint pain.  However he emphasizes that he feels extremely weak    Past Medical History:  Diagnosis Date   Arthritis    Cervical spine ankylosis    CKD (chronic kidney disease) stage 3, GFR 30-59 ml/min (HCC)    COLONIC POLYPS, HX OF    CORONARY ARTERY DISEASE    a. s/p stent to RCA 2005. b. Stent to Cx 2007. c.  CABG X4 2014; d. LHC 04/19/18 occluded SVG-Circ, DES to in-stent restenosis of circ.    COVID-19 2020   Diverticulosis    DIVERTICULOSIS, COLON    DYSPHAGIA UNSPECIFIED    intermittent   GERD    HEMORRHOIDS    History of kidney stones    HYPERLIPIDEMIA    HYPERTENSION    HYPERTRIGLYCERIDEMIA    Left carotid artery  occlusion    OBESITY    Peripheral vascular disease    Right Carotid Stenosis s/p carotids endarterectomy   Postoperative atrial fibrillation (HCC) 2014   Pseudoaneurysm of left femoral artery (HCC) 02/16/2022   SLEEP APNEA    on CPAP   Statin intolerance    Tobacco abuse    Vertebral artery occlusion, left    Warthin's tumor    left   Past Surgical History:  Procedure Laterality Date   ABDOMINAL AORTOGRAM W/LOWER EXTREMITY N/A 02/13/2022   Procedure: ABDOMINAL AORTOGRAM W/LOWER EXTREMITY;  Surgeon: Court Dorn PARAS, MD;  Location: [facility];  Service: Cardiovascular;  Laterality: N/A;   ABDOMINAL AORTOGRAM W/LOWER EXTREMITY N/A 08/28/2022   Procedure: ABDOMINAL AORTOGRAM W/LOWER EXTREMITY;  Surgeon: Gretta Lonni PARAS, MD;  Location: [facility];  Service: Cardiovascular;  Laterality: N/A;   ACHILLES TENDON REPAIR     ANTERIOR LAT LUMBAR FUSION Left 03/23/2024   Procedure: LEFT-SIDED LUMBAR TWO - LUMBAR THREE, LUMBAR THREE - LUMBAR FOUR LATERAL INTERBODY FUSION WITH INSTRUMENTATION AND ALLOGRAFT;  Surgeon: Beuford Anes, MD;  Location: [facility];  Service: Orthopedics;  Laterality: Left;  LEFT-SIDED LUMBAR 2 - LUMBAR 3, LUMBAR 3- LUMBAR 4 LATERAL INTERBODY FUSION WITH INSTRUMENTATION AND ALLOGRAFT  BACK SURGERY     lower back   CARDIAC CATHETERIZATION     CARPAL TUNNEL RELEASE Left 12/07/2023   Procedure: LEFT CARPAL TUNNEL RELEASE;  Surgeon: Murrell Drivers, MD;  Location: [facility];  Service: Orthopedics;  Laterality: Left;  60 MIN   CATARACT EXTRACTION Bilateral    COLONOSCOPY     CORONARY ARTERY BYPASS GRAFT  08/18/2012   Procedure: CORONARY ARTERY BYPASS GRAFTING (CABG);  Surgeon: Dorise MARLA Fellers, MD;  Location: [facility];  Service: Open Heart Surgery;  Laterality: N/A;  CABG x four;  using left internal mammary artery and right leg greater saphenous vein harvested endoscopically   CORONARY STENT INTERVENTION N/A 04/19/2018   Procedure: CORONARY STENT  INTERVENTION;  Surgeon: Court Dorn PARAS, MD;  [facility]: [facility];  Service: Cardiovascular;  Laterality: N/A;   DECOMPRESSIVE LUMBAR LAMINECTOMY LEVEL 4 N/A 03/24/2024   Procedure: DECOMPRESSIVE LUMBAR LAMINECTOMY LEVEL 4;  Surgeon: Beuford Anes, MD;  Location: [facility];  Service: Orthopedics;  Laterality: N/A;  LUMBAR 2- LUMBAR 3, LUMBAR 3- LUMBAR 4, LUMBAR 4- LUMBAR 5, LUMBAR 5 - SACRUM1 DECOMPRESSION AND FUSION   ENDARTERECTOMY Right 09/04/2023   Procedure: ENDARTERECTOMY CAROTID;  Surgeon: Serene Gaile ORN, MD;  Location: [facility];  Service: Vascular;  Laterality: Right;   ENDARTERECTOMY FEMORAL Left 02/17/2022   Procedure: ENDARTERECTOMY FEMORAL;  Surgeon: Gretta Lonni PARAS, MD;  Location: [facility] OR;  Service: Vascular;  Laterality: Left;   INTRAOPERATIVE ARTERIOGRAM Left 02/17/2022   Procedure: INTRA OPERATIVE ARTERIOGRAM;  Surgeon: Gretta Lonni PARAS, MD;  Location: [facility];  Service: Vascular;  Laterality: Left;   KNEE ARTHROSCOPY     RIGHT   LEFT HEART CATH AND CORS/GRAFTS ANGIOGRAPHY N/A 04/19/2018   Procedure: LEFT HEART CATH AND CORS/GRAFTS ANGIOGRAPHY;  Surgeon: Court Dorn PARAS, MD;  [facility]: [facility];  Service: Cardiovascular;  Laterality: N/A;   LUMBAR LAMINECTOMY/DECOMPRESSION MICRODISCECTOMY N/A 12/05/2015   Procedure: LUMBAR 2-3, LUMBAR 3-4 DECOMPRESSION ;  Surgeon: Anes Beuford, MD;  Location: [facility];  Service: Orthopedics;  Laterality: N/A;   MASS EXCISION Left 12/07/2023   Procedure: LEFT PALM MASS EXCISION;  Surgeon: Murrell Drivers, MD;  Location:  SURGERY CENTER;  Service: Orthopedics;  Laterality: Left;   PATCH ANGIOPLASTY Left 02/17/2022   Procedure: PATCH ANGIOPLASTY Left Common Femoral Artery.;  Surgeon: Gretta Lonni PARAS, MD;  Location: [facility] OR;  Service: Vascular;  Laterality: Left;   PERIPHERAL VASCULAR ATHERECTOMY  02/13/2022   Procedure: PERIPHERAL VASCULAR ATHERECTOMY;  Surgeon: Court Dorn PARAS, MD;  [facility];   Service: Cardiovascular;;  Rt SFA   PERIPHERAL VASCULAR INTERVENTION Left 08/28/2022   Procedure: PERIPHERAL VASCULAR INTERVENTION;  Surgeon: Gretta Lonni PARAS, MD;  [facility];  Service: Cardiovascular;  Laterality: Left;  SFA/POP   Right long finger extensor tendon repair     THROMBECTOMY FEMORAL ARTERY Left 02/17/2022   Procedure: Repair of Left COMMON FEMORAL PSEUDOANEURYSM.;  Surgeon: Gretta Lonni PARAS, MD;  Location: [facility];  Service: Vascular;  Laterality: Left;   TRANSFORAMINAL LUMBAR INTERBODY FUSION (TLIF) WITH PEDICLE SCREW FIXATION 2 LEVEL Left 03/24/2024   Procedure: TRANSFORAMINAL LUMBAR INTERBODY FUSION (TLIF) WITH PEDICLE SCREW FIXATION 2 LEVEL;  Surgeon: Beuford Anes, MD;  Location: [facility];  Service: Orthopedics;  Laterality: Left;  LEFT-SIDED LUMBAR 4- LUMBAR 5, LUMBAR 5 - SACRUM 1 TRANSFORAMINAL LUMBAR INTERBODY FUSION WITH INSTRUMENTATION AND ALLOGRAFT   Current Outpatient Medications on File Prior to Visit  Medication Sig Dispense Refill  amLODipine  (NORVASC ) 10 MG tablet TAKE ONE TABLET (10 MG TOTAL) BY MOUTH DAILY. 90 tablet 3   aspirin  EC 81 MG tablet Take 1 tablet (81 mg total) by mouth daily. 90 tablet 3   azelastine  (ASTELIN ) 0.1 % nasal spray Place 2 sprays into both nostrils 2 (two) times daily. Use in each nostril as directed (Patient taking differently: Place 1 spray into both nostrils daily. Use in each nostril as directed) 30 mL 12   Bacillus Coagulans-Inulin (PROBIOTIC-PREBIOTIC PO) Take 1 capsule by mouth daily.     cetirizine  (ZYRTEC ) 10 MG tablet Take 1 tablet (10 mg total) by mouth daily. 30 tablet 11   clopidogrel  (PLAVIX ) 75 MG tablet TAKE ONE TABLET (75 MG TOTAL) BY MOUTH DAILY. 90 tablet 3   losartan -hydrochlorothiazide  (HYZAAR) 100-12.5 MG tablet TAKE ONE TABLET BY MOUTH DAILY. EMERGENCY FILL 90 tablet 2   pantoprazole  (PROTONIX ) 40 MG tablet TAKE ONE TABLET (40 MG TOTAL) BY MOUTH DAILY. 90 tablet 2   REPATHA  SURECLICK 140 MG/ML  SOAJ INJECT ONE ML INTO THE SKIN EVERY 14 DAYS. 6 mL 3   sildenafil  (VIAGRA ) 100 MG tablet Take 0.5-1 tablets (50-100 mg total) by mouth daily as needed for erectile dysfunction. 5 tablet 11   acetaminophen  (TYLENOL ) 500 MG tablet Take 1,000 mg by mouth every 6 (six) hours as needed for mild pain. (Patient not taking: Reported on 08/18/2024)     baclofen (LIORESAL) 10 MG tablet Take 1 tablet (10 mg total) by mouth 3 (three) times daily. (Patient not taking: Reported on 08/18/2024) 30 each 0   dapagliflozin propanediol (FARXIGA) 10 MG TABS tablet Take 1 tablet (10 mg total) by mouth daily. (Patient not taking: Reported on 08/18/2024) 90 tablet 3   diclofenac (VOLTAREN) 75 MG EC tablet Take 1 tablet (75 mg total) by mouth 2 (two) times daily. (Patient not taking: Reported on 08/18/2024) 30 tablet 0   HYDROmorphone  (DILAUDID ) 2 MG tablet Take 1 tablet (2 mg total) by mouth every 6 (six) hours as needed for severe pain (pain score 7-10). (Patient not taking: Reported on 08/18/2024) 30 tablet 0   methocarbamol  (ROBAXIN ) 500 MG tablet Take 500 mg by mouth every 8 (eight) hours as needed for muscle spasms. (Patient not taking: Reported on 08/18/2024)     methocarbamol  (ROBAXIN ) 500 MG tablet Take 1-2 tablets (500-1,000 mg total) by mouth every 6 (six) hours as needed for muscle spasms. (Patient not taking: Reported on 08/18/2024) 60 tablet 0   nitroGLYCERIN  (NITROSTAT ) 0.4 MG SL tablet Place 1 tablet (0.4 mg total) under the tongue every 5 (five) minutes as needed for chest pain. (Patient not taking: Reported on 08/18/2024) 25 tablet 3   predniSONE  (DELTASONE ) 20 MG tablet 3 tabs poqday 1-2, 2 tabs poqday 3-4, 1 tab poqday 5-6 (Patient not taking: Reported on 08/18/2024) 12 tablet 0   No current facility-administered medications on file prior to visit.   Allergies  Allergen Reactions   Statins Other (See Comments)    Bone and muscle pain   Crestor  [Rosuvastatin ]     myalgias   Flonase  [Fluticasone ]      Nose bleeding    Lipitor [Atorvastatin]     myalgias   Oxycodone  Nausea And Vomiting   Hydrocodone  Nausea And Vomiting   Social History   Socioeconomic History   Marital status: Married    Spouse name: Not on file   Number of children: 3   Years of education: Not on file   Highest education level: Not on  file  Occupational History   Not on file  Tobacco Use   Smoking status: Every Day    Current packs/day: 1.00    Average packs/day: 1 pack/day for 43.0 years (43.0 ttl pk-yrs)    Types: Cigarettes    Passive exposure: Current (Wife)   Smokeless tobacco: Never   Tobacco comments:    Pt still smoking 1 PPD 09/02/2022 PAP  Vaping Use   Vaping status: Never Used  Substance and Sexual Activity   Alcohol  use: Not Currently    Comment: pt quit drinking around June 2024   Drug use: No   Sexual activity: Yes  Other Topics Concern   Not on file  Social History Narrative   Not on file   Social Drivers of Health   Financial Resource Strain: Not on file  Food Insecurity: No Food Insecurity (09/04/2023)   Hunger Vital Sign    Worried About Running Out of Food in the Last Year: Never true    Ran Out of Food in the Last Year: Never true  Transportation Needs: No Transportation Needs (09/04/2023)   PRAPARE - Administrator, Civil Service (Medical): No    Lack of Transportation (Non-Medical): No  Physical Activity: Not on file  Stress: Not on file  Social Connections: Unknown (03/11/2022)   [facility]   Social Network    Social Network: Not on file  Intimate Partner Violence: Not At Risk (09/04/2023)   Humiliation, Afraid, Rape, and Kick questionnaire    Fear of Current or Ex-Partner: No    Emotionally Abused: No    Physically Abused: No    Sexually Abused: No      Review of Systems  All other systems reviewed and are negative.      Objective:   Physical Exam Vitals reviewed.  Constitutional:      General: He is not in acute distress.     Appearance: Normal appearance. He is well-developed and normal weight. He is not ill-appearing, toxic-appearing or diaphoretic.  HENT:     Head: Normocephalic and atraumatic.     Right Ear: Tympanic membrane and ear canal normal.     Left Ear: Tympanic membrane and ear canal normal.     Nose: No congestion or rhinorrhea.     Mouth/Throat:     Mouth: Mucous membranes are moist.     Pharynx: Oropharynx is clear. No oropharyngeal exudate or posterior oropharyngeal erythema.  Eyes:     Extraocular Movements: Extraocular movements intact.     Conjunctiva/sclera: Conjunctivae normal.     Pupils: Pupils are equal, round, and reactive to light.  Cardiovascular:     Rate and Rhythm: Normal rate and regular rhythm.     Pulses: Normal pulses.     Heart sounds: Normal heart sounds.     No friction rub. No gallop.  Pulmonary:     Effort: Pulmonary effort is normal. No respiratory distress.     Breath sounds: Normal breath sounds. No wheezing or rales.  Abdominal:     General: Bowel sounds are normal. There is no distension.     Palpations: Abdomen is soft. Abdomen is not rigid.     Tenderness: There is abdominal tenderness. There is no guarding or rebound. Negative signs include Murphy's sign and McBurney's sign.   Musculoskeletal:        General: No swelling, tenderness, deformity or signs of injury.     Right lower leg: No edema.     Left lower  leg: No edema.  Lymphadenopathy:     Cervical: No cervical adenopathy.  Skin:    Coloration: Skin is not jaundiced or pale.     Findings: No bruising, erythema, lesion or rash.  Neurological:     General: No focal deficit present.     Mental Status: He is alert and oriented to person, place, and time. Mental status is at baseline.     Cranial Nerves: No cranial nerve deficit.     Sensory: No sensory deficit.     Motor: No weakness.     Coordination: Coordination normal.     Gait: Gait normal.     Deep Tendon Reflexes: Reflexes normal.   Psychiatric:        Mood and Affect: Mood normal.        Behavior: Behavior normal.        Thought Content: Thought content normal.        Judgment: Judgment normal.           Assessment & Plan:  Chills - Plan: Urinalysis, Routine w reflex microscopic  Fever, unspecified fever cause - Plan: CBC with Differential/Platelet, Comprehensive metabolic panel with GFR, Culture, blood (single) w Reflex to ID Panel, DG Chest 2 View, B. burgdorfi antibodies by WB, [place] spotted fvr abs pnl(IgG+IgM) Patient has had fever of unknown origin daily for the last 2 weeks with no other specific symptoms aside from 1 abnormal stool and some vague left lower quadrant abdominal pain on exam.  He does have a history of diverticulitis so I will start the patient on Augmentin  875 mg twice daily for 10 days to cover diverticulitis.  Given his smoking he would be at high risk for pneumonia.  I will check a chest x-ray however his pulmonary exam does not show any signs of pneumonia.  Given his recent surgical history, postoperative complications and bacteremia is on the differential diagnosis.  I will check blood cultures.  If blood cultures are positive I would recommend an MRI of the lower back as well as an echocardiogram but this will need to be done while patient was in the hospital.  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

## 2024-08-18 NOTE — Addendum Note (Signed)
 Addended by: ANGELENA RONAL BRADLEY K on: 08/18/2024 12:56 PM   Modules accepted: Orders

## 2024-08-19 ENCOUNTER — Ambulatory Visit: Payer: Self-pay | Admitting: Family Medicine

## 2024-08-22 ENCOUNTER — Other Ambulatory Visit: Payer: Self-pay

## 2024-08-22 ENCOUNTER — Telehealth: Payer: Self-pay

## 2024-08-22 MED ORDER — POTASSIUM CHLORIDE CRYS ER 20 MEQ PO TBCR: 20.0000 meq | EXTENDED_RELEASE_TABLET | Freq: Every day | ORAL | 0 refills | Status: AC

## 2024-08-22 NOTE — Telephone Encounter (Signed)
 Copied from CRM #[id number]. Topic: Clinical - Medication Question >> Aug 22, 2024 10:44 AM Willma R wrote: Reason for CRM: Patient states he received a message from a nurse to stop taking the losartan -hydrochlorothiazide  (HYZAAR) 100-12.5 MG tablet and start taking potassium chloride  SA (KLOR-CON  M) 20 MEQ tablet. States he took the losartan  this morning already and wants to know what he should do.  Patient can be reached at [phone]

## 2024-08-22 NOTE — Telephone Encounter (Signed)
 Spoke with pt and informed him not to take anymore Losartan  until he is rechecked by Dr Duanne on Friday.

## 2024-08-22 NOTE — Progress Notes (Signed)
 Pt informed and verbalized understanding. He is scheduled for this Friday for a recheck.

## 2024-08-22 NOTE — Progress Notes (Signed)
Pt states that he is feeling a lot better.

## 2024-08-23 ENCOUNTER — Ambulatory Visit
Admission: RE | Admit: 2024-08-23 | Discharge: 2024-08-23 | Disposition: A | Discharge status: Home / Self Care | Source: Ambulatory Visit | Attending: Family Medicine | Admitting: Family Medicine

## 2024-08-23 DIAGNOSIS — Z122 Encounter for screening for malignant neoplasm of respiratory organs: Secondary | ICD-10-CM

## 2024-08-24 LAB — CULTURE, BLOOD (SINGLE)
MICRO NUMBER:: 17138890
MICRO NUMBER:: 17138893
Result:: NO GROWTH
Result:: NO GROWTH
SPECIMEN QUALITY:: ADEQUATE
SPECIMEN QUALITY:: ADEQUATE

## 2024-08-26 ENCOUNTER — Encounter: Payer: Self-pay | Admitting: Family Medicine

## 2024-08-26 ENCOUNTER — Ambulatory Visit: Admitting: Family Medicine

## 2024-08-26 VITALS — BP 172/72 | HR 88 | Temp 97.7°F | Resp 18 | Ht 69.0 in | Wt 216.0 lb

## 2024-08-26 DIAGNOSIS — R6883 Chills (without fever): Secondary | ICD-10-CM | POA: Diagnosis not present

## 2024-08-26 LAB — COMPREHENSIVE METABOLIC PANEL WITH GFR
AG Ratio: 1.6 (calc) (ref 1.0–2.5)
ALT: 34 U/L (ref 9–46)
AST: 34 U/L (ref 10–35)
Albumin: 3.8 g/dL (ref 3.6–5.1)
Alkaline phosphatase (APISO): 70 U/L (ref 35–144)
BUN/Creatinine Ratio: 12 (calc) (ref 6–22)
BUN: 25 mg/dL (ref 7–25)
CO2: 21 mmol/L (ref 20–32)
Calcium: 8.4 mg/dL — ABNORMAL LOW (ref 8.6–10.3)
Chloride: 99 mmol/L (ref 98–110)
Creat: 2.01 mg/dL — ABNORMAL HIGH (ref 0.70–1.28)
Globulin: 2.4 g/dL (ref 1.9–3.7)
Glucose, Bld: 140 mg/dL — ABNORMAL HIGH (ref 65–99)
Potassium: 3.2 mmol/L — ABNORMAL LOW (ref 3.5–5.3)
Sodium: 132 mmol/L — ABNORMAL LOW (ref 135–146)
Total Bilirubin: 0.6 mg/dL (ref 0.2–1.2)
Total Protein: 6.2 g/dL (ref 6.1–8.1)
eGFR: 35 mL/min/1.73m2 — ABNORMAL LOW (ref >=60)

## 2024-08-26 LAB — CBC WITH DIFFERENTIAL/PLATELET
Absolute Lymphocytes: 642 {cells}/uL — ABNORMAL LOW (ref 850–3900)
Absolute Monocytes: 375 {cells}/uL (ref 200–950)
Basophils Absolute: 30 {cells}/uL (ref 0–200)
Basophils Relative: 1 %
Eosinophils Absolute: 30 {cells}/uL (ref 15–500)
Eosinophils Relative: 1 %
HCT: 37.9 % — ABNORMAL LOW (ref 38.5–50.0)
Hemoglobin: 12.5 g/dL — ABNORMAL LOW (ref 13.2–17.1)
MCH: 27.8 pg (ref 27.0–33.0)
MCHC: 33 g/dL (ref 32.0–36.0)
MCV: 84.2 fL (ref 80.0–100.0)
MPV: 10.4 fL (ref 7.5–12.5)
Monocytes Relative: 12.5 %
Neutro Abs: 1923 {cells}/uL (ref 1500–7800)
Neutrophils Relative %: 64.1 %
Platelets: 114 Thousand/uL — ABNORMAL LOW (ref 140–400)
RBC: 4.5 Million/uL (ref 4.20–5.80)
RDW: 14.2 % (ref 11.0–15.0)
Total Lymphocyte: 21.4 %
WBC: 3 Thousand/uL — ABNORMAL LOW (ref 3.8–10.8)

## 2024-08-26 LAB — B. BURGDORFI ANTIBODIES BY WB
B burgdorferi IgG Abs (IB): NEGATIVE
B burgdorferi IgM Abs (IB): NEGATIVE
Lyme Disease 18 kD IgG: NONREACTIVE
Lyme Disease 23 kD IgG: NONREACTIVE
Lyme Disease 23 kD IgM: NONREACTIVE
Lyme Disease 28 kD IgG: NONREACTIVE
Lyme Disease 30 kD IgG: NONREACTIVE
Lyme Disease 39 kD IgG: NONREACTIVE
Lyme Disease 39 kD IgM: NONREACTIVE
Lyme Disease 41 kD IgG: NONREACTIVE
Lyme Disease 41 kD IgM: NONREACTIVE
Lyme Disease 45 kD IgG: NONREACTIVE
Lyme Disease 58 kD IgG: REACTIVE — AB
Lyme Disease 66 kD IgG: NONREACTIVE
Lyme Disease 93 kD IgG: NONREACTIVE

## 2024-08-26 LAB — ROCKY MTN SPOTTED FVR ABS PNL(IGG+IGM)
RMSF IgG: NOT DETECTED
RMSF IgM: NOT DETECTED

## 2024-08-26 NOTE — Progress Notes (Signed)
 Subjective:    Patient ID: [name], male    DOB: 1955-11-10, 68 y.o.   MRN: [id number]  HPI 10/23 Had CPE 10/16 with labs that were relatively normal.  Patient had back surgery in May.  He has not had any issues after the surgery.  The surgical site on his lower back and on his left flank appear well-healed.  There is no tenderness to palpation around that.  He denies any back pain.  Starting 2 weeks ago he developed chills and fever every afternoon around 2 or 3:00.  He states that he feels extremely weak and tired however he really has no other symptoms.  Specifically he denies any cough.  He denies any pleurisy.  He denies any purulent sputum or hemoptysis.  He denies any chest pain.  He denies any nausea or vomiting or diarrhea.  He had 1 episode of melanotic stool earlier this week and he also has tenderness to palpation in the left lower quadrant although it is not severe.  He denies any dysuria or urgency or frequency.  Urinalysis today shows trace blood but negative nitrates and negative leukocyte esterase.  I performed a prostate exam.  Prostate was nonswollen and was not tender.  He denies any sore throat or sinus pain or headaches other symptoms of an upper respiratory infection.  He denies any rash.  He denies any recent tick bites.  He denies any joint pain.  However he emphasizes that he feels extremely weak.  At that time, my plan was: Patient has had fever of unknown origin daily for the last 2 weeks with no other specific symptoms aside from 1 abnormal stool and some vague left lower quadrant abdominal pain on exam.  He does have a history of diverticulitis so I will start the patient on Augmentin  875 mg twice daily for 10 days to cover diverticulitis.  Given his smoking he would be at high risk for pneumonia.  I will check a chest x-ray however his pulmonary exam does not show any signs of pneumonia.  Given his recent surgical history, postoperative complications and bacteremia is  on the differential diagnosis.  I will check blood cultures.  If blood cultures are positive I would recommend an MRI of the lower back as well as an echocardiogram but this will need to be done while patient was in the hospital.  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  08/26/24 Patient is here today for recheck.  He states that he feels much better.  He is no longer having chills.  He denies any further abdominal pain.  He denies any other symptoms.  His weakness has improved.  However his blood pressure is very high.  He has been holding losartan  after his creatinine rose to 2.0.  Also of note, his white blood cell count, his hemoglobin, and his platelet counts were all suppressed.  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  [facility] spotted fever test, blood cultures all were negative.  Chest x-ray was negative.  Thankfully the patient is feeling better    Past Medical History:  Diagnosis Date   Arthritis    Cervical spine ankylosis    CKD (chronic kidney disease) stage 3, GFR 30-59 ml/min (HCC)    COLONIC POLYPS, HX OF    CORONARY ARTERY DISEASE    a. s/p stent  to RCA 2005. b. Stent to Cx 2007. c.  CABG X4 2014; d. LHC 04/19/18 occluded SVG-Circ, DES to in-stent restenosis of circ.    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/16/2022   SLEEP APNEA    on CPAP   Statin intolerance    Tobacco abuse    Vertebral artery occlusion, left    Warthin's tumor     left   Past Surgical History:  Procedure Laterality Date   ABDOMINAL AORTOGRAM W/LOWER EXTREMITY N/A 02/13/2022   Procedure: ABDOMINAL AORTOGRAM W/LOWER EXTREMITY;  Surgeon: Court Dorn PARAS, MD;  Location: [facility];  Service: Cardiovascular;  Laterality: N/A;   ABDOMINAL AORTOGRAM W/LOWER EXTREMITY N/A 08/28/2022   Procedure: ABDOMINAL AORTOGRAM W/LOWER EXTREMITY;  Surgeon: Gretta Lonni PARAS, MD;  Location: [facility];  Service: Cardiovascular;  Laterality: N/A;   ACHILLES TENDON REPAIR     ANTERIOR LAT LUMBAR FUSION Left 03/23/2024   Procedure: LEFT-SIDED LUMBAR TWO - LUMBAR THREE, LUMBAR THREE - LUMBAR FOUR LATERAL INTERBODY FUSION WITH INSTRUMENTATION AND ALLOGRAFT;  Surgeon: Beuford Anes, MD;  Location: [facility] OR;  Service: Orthopedics;  Laterality: Left;  LEFT-SIDED LUMBAR 2 - LUMBAR 3, LUMBAR 3- LUMBAR 4 LATERAL INTERBODY FUSION WITH INSTRUMENTATION AND ALLOGRAFT   BACK SURGERY     lower back   CARDIAC CATHETERIZATION     CARPAL TUNNEL RELEASE Left 12/07/2023   Procedure: LEFT CARPAL TUNNEL RELEASE;  Surgeon: Murrell Drivers, MD;  Location:  SURGERY CENTER;  Service: Orthopedics;  Laterality: Left;  60 MIN   CATARACT EXTRACTION Bilateral    COLONOSCOPY     CORONARY ARTERY BYPASS GRAFT  08/18/2012   Procedure: CORONARY ARTERY BYPASS GRAFTING (CABG);  Surgeon: Dorise MARLA Fellers, MD;  Location: [facility] OR;  Service: Open Heart Surgery;  Laterality: N/A;  CABG x four;  using left internal mammary artery and right leg greater saphenous vein harvested endoscopically   CORONARY STENT INTERVENTION N/A 04/19/2018   Procedure: CORONARY STENT INTERVENTION;  Surgeon: Court Dorn PARAS, MD;  [facility]: [facility];  Service: Cardiovascular;  Laterality: N/A;   DECOMPRESSIVE LUMBAR LAMINECTOMY LEVEL 4 N/A 03/24/2024   Procedure: DECOMPRESSIVE LUMBAR LAMINECTOMY LEVEL 4;  Surgeon: Beuford Anes, MD;  Location: [facility];  Service: Orthopedics;  Laterality: N/A;  LUMBAR 2- LUMBAR 3,  LUMBAR 3- LUMBAR 4, LUMBAR 4- LUMBAR 5, LUMBAR 5 - SACRUM1 DECOMPRESSION AND FUSION   ENDARTERECTOMY Right 09/04/2023   Procedure: ENDARTERECTOMY CAROTID;  Surgeon: Serene Gaile ORN, MD;  Location: [facility] OR;  Service: Vascular;  Laterality: Right;   ENDARTERECTOMY FEMORAL Left 02/17/2022   Procedure: ENDARTERECTOMY FEMORAL;  Surgeon: Gretta Lonni PARAS, MD;  Location: [facility] OR;  Service: Vascular;  Laterality: Left;   INTRAOPERATIVE ARTERIOGRAM Left 02/17/2022   Procedure: INTRA OPERATIVE ARTERIOGRAM;  Surgeon: Gretta Lonni PARAS, MD;  Location: [facility] OR;  Service: Vascular;  Laterality: Left;   KNEE ARTHROSCOPY     RIGHT   LEFT HEART CATH AND CORS/GRAFTS ANGIOGRAPHY N/A 04/19/2018   Procedure: LEFT HEART CATH AND CORS/GRAFTS ANGIOGRAPHY;  Surgeon: Court Dorn PARAS, MD;  Location: [facility] INV[facility]IVE CV LAB;  Service: Cardiovascular;  Laterality: N/A;   LUMBAR LAMINECTOMY/DECOMPRESSION MICRODISCECTOMY N/A 12/05/2015   Procedure: LUMBAR 2-3, LUMBAR 3-4 DECOMPRESSION ;  Surgeon: Oneil Priestly, MD;  Location: [facility];  Service: Orthopedics;  Laterality: N/A;   MASS EXCISION Left 12/07/2023   Procedure: LEFT PALM MASS EXCISION;  Surgeon: Murrell Drivers, MD;  Location: [facility];  Service: Orthopedics;  Laterality: Left;   PATCH ANGIOPLASTY Left 02/17/2022   Procedure: PATCH ANGIOPLASTY Left Common Femoral Artery.;  Surgeon: Gretta Lonni PARAS, MD;  Location: [facility] OR;  Service: Vascular;  Laterality: Left;   PERIPHERAL VASCULAR ATHERECTOMY  02/13/2022   Procedure: PERIPHERAL VASCULAR ATHERECTOMY;  Surgeon: Court Dorn PARAS, MD;  [facility]: [facility];  Service: Cardiovascular;;  Rt SFA   PERIPHERAL VASCULAR INTERVENTION Left 08/28/2022   Procedure: PERIPHERAL VASCULAR INTERVENTION;  Surgeon: Gretta Lonni PARAS, MD;  Location: [facility];  Service: Cardiovascular;  Laterality: Left;  SFA/POP   Right long finger extensor tendon repair     THROMBECTOMY FEMORAL ARTERY Left 02/17/2022    Procedure: Repair of Left COMMON FEMORAL PSEUDOANEURYSM.;  Surgeon: Gretta Lonni PARAS, MD;  Location: [facility];  Service: Vascular;  Laterality: Left;   TRANSFORAMINAL LUMBAR INTERBODY FUSION (TLIF) WITH PEDICLE SCREW FIXATION 2 LEVEL Left 03/24/2024   Procedure: TRANSFORAMINAL LUMBAR INTERBODY FUSION (TLIF) WITH PEDICLE SCREW FIXATION 2 LEVEL;  Surgeon: Priestly Oneil, MD;  Location: [facility] OR;  Service: Orthopedics;  Laterality: Left;  LEFT-SIDED LUMBAR 4- LUMBAR 5, LUMBAR 5 - SACRUM 1 TRANSFORAMINAL LUMBAR INTERBODY FUSION WITH INSTRUMENTATION AND ALLOGRAFT   Current Outpatient Medications on File Prior to Visit  Medication Sig Dispense Refill   amLODipine  (NORVASC ) 10 MG tablet TAKE ONE TABLET (10 MG TOTAL) BY MOUTH DAILY. 90 tablet 3   amoxicillin -clavulanate (AUGMENTIN ) 875-125 MG tablet Take 1 tablet by mouth 2 (two) times daily. 20 tablet 0   aspirin  EC 81 MG tablet Take 1 tablet (81 mg total) by mouth daily. 90 tablet 3   clopidogrel  (PLAVIX ) 75 MG tablet TAKE ONE TABLET (75 MG TOTAL) BY MOUTH DAILY. 90 tablet 3   pantoprazole  (PROTONIX ) 40 MG tablet TAKE ONE TABLET (40 MG TOTAL) BY MOUTH DAILY. 90 tablet 2   REPATHA  SURECLICK 140 MG/ML SOAJ INJECT ONE ML INTO THE SKIN EVERY 14 DAYS. 6 mL 3   acetaminophen  (TYLENOL ) 500 MG tablet Take 1,000 mg by mouth every 6 (six) hours as needed for mild pain. (Patient not taking: Reported on 08/18/2024)     azelastine  (ASTELIN ) 0.1 % nasal spray Place 2 sprays into both nostrils 2 (two) times daily. Use in each nostril as directed (Patient taking differently: Place 1 spray into both nostrils daily. Use in each nostril as directed) 30 mL 12   Bacillus Coagulans-Inulin (PROBIOTIC-PREBIOTIC PO) Take 1 capsule by mouth daily.     baclofen (LIORESAL) 10 MG tablet Take 1 tablet (10 mg total) by mouth 3 (three) times daily. (Patient not taking: Reported on 08/18/2024) 30 each 0   cetirizine  (ZYRTEC ) 10 MG tablet Take 1 tablet (10 mg total) by mouth daily. 30  tablet 11   dapagliflozin propanediol (FARXIGA) 10 MG TABS tablet Take 1 tablet (10 mg total) by mouth daily. (Patient not taking: Reported on 08/18/2024) 90 tablet 3   losartan -hydrochlorothiazide  (HYZAAR) 100-12.5 MG tablet TAKE ONE TABLET BY MOUTH DAILY. EMERGENCY FILL 90 tablet 2   nitroGLYCERIN  (NITROSTAT ) 0.4 MG SL tablet Place 1 tablet (0.4 mg  total) under the tongue every 5 (five) minutes as needed for chest pain. (Patient not taking: Reported on 08/18/2024) 25 tablet 3   potassium chloride  SA (KLOR-CON  M) 20 MEQ tablet Take 1 tablet (20 mEq total) by mouth daily. (Patient not taking: Reported on 08/26/2024) 5 tablet 0   sildenafil  (VIAGRA ) 100 MG tablet Take 0.5-1 tablets (50-100 mg total) by mouth daily as needed for erectile dysfunction. 5 tablet 11   No current facility-administered medications on file prior to visit.   Allergies  Allergen Reactions   Statins Other (See Comments)    Bone and muscle pain   Crestor  [Rosuvastatin ]     myalgias   Flonase  [Fluticasone ]     Nose bleeding    Lipitor [Atorvastatin]     myalgias   Oxycodone  Nausea And Vomiting   Hydrocodone  Nausea And Vomiting   Social History   Socioeconomic History   Marital status: Married    Spouse name: Not on file   Number of children: 3   Years of education: Not on file   Highest education level: Not on file  Occupational History   Not on file  Tobacco Use   Smoking status: Every Day    Current packs/day: 1.00    Average packs/day: 1 pack/day for 43.0 years (43.0 ttl pk-yrs)    Types: Cigarettes    Passive exposure: Current (Wife)   Smokeless tobacco: Never   Tobacco comments:    Pt still smoking 1 PPD 09/02/2022 PAP  Vaping Use   Vaping status: Never Used  Substance and Sexual Activity   Alcohol  use: Not Currently    Comment: pt quit drinking around June 2024   Drug use: No   Sexual activity: Yes  Other Topics Concern   Not on file  Social History Narrative   Not on file   Social Drivers  of Health   Financial Resource Strain: Not on file  Food Insecurity: No Food Insecurity (09/04/2023)   Hunger Vital Sign    Worried About Running Out of Food in the Last Year: Never true    Ran Out of Food in the Last Year: Never true  Transportation Needs: No Transportation Needs (09/04/2023)   PRAPARE - Administrator, Civil Service (Medical): No    Lack of Transportation (Non-Medical): No  Physical Activity: Not on file  Stress: Not on file  Social Connections: Unknown (03/11/2022)   Received from [facility]   Social Network    Social Network: Not on file  Intimate Partner Violence: Not At Risk (09/04/2023)   Humiliation, Afraid, Rape, and Kick questionnaire    Fear of Current or Ex-Partner: No    Emotionally Abused: No    Physically Abused: No    Sexually Abused: No      Review of Systems  All other systems reviewed and are negative.      Objective:   Physical Exam Vitals reviewed.  Constitutional:      General: He is not in acute distress.    Appearance: Normal appearance. He is well-developed and normal weight. He is not ill-appearing, toxic-appearing or diaphoretic.  HENT:     Head: Normocephalic and atraumatic.     Right Ear: Tympanic membrane and ear canal normal.     Left Ear: Tympanic membrane and ear canal normal.     Nose: No congestion or rhinorrhea.     Mouth/Throat:     Mouth: Mucous membranes are moist.     Pharynx: Oropharynx is clear. No  oropharyngeal exudate or posterior oropharyngeal erythema.  Eyes:     Extraocular Movements: Extraocular movements intact.     Conjunctiva/sclera: Conjunctivae normal.     Pupils: Pupils are equal, round, and reactive to light.  Cardiovascular:     Rate and Rhythm: Normal rate and regular rhythm.     Pulses: Normal pulses.     Heart sounds: Normal heart sounds.     No friction rub. No gallop.  Pulmonary:     Effort: Pulmonary effort is normal. No respiratory distress.     Breath sounds: Normal  breath sounds. No wheezing or rales.  Abdominal:     General: Bowel sounds are normal. There is no distension.     Palpations: Abdomen is soft. Abdomen is not rigid.     Tenderness: There is no abdominal tenderness. There is no guarding or rebound. Negative signs include Murphy's sign and McBurney's sign.  Musculoskeletal:        General: No swelling, tenderness, deformity or signs of injury.     Right lower leg: No edema.     Left lower leg: No edema.  Lymphadenopathy:     Cervical: No cervical adenopathy.  Skin:    Coloration: Skin is not jaundiced or pale.     Findings: No bruising, erythema, lesion or rash.  Neurological:     General: No focal deficit present.     Mental Status: He is alert and oriented to person, place, and time. Mental status is at baseline.     Cranial Nerves: No cranial nerve deficit.     Sensory: No sensory deficit.     Motor: No weakness.     Coordination: Coordination normal.     Gait: Gait normal.     Deep Tendon Reflexes: Reflexes normal.  Psychiatric:        Mood and Affect: Mood normal.        Behavior: Behavior normal.        Thought Content: Thought content normal.        Judgment: Judgment normal.           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  Clinically he feels much better now.  I asked him to finish the potassium that I gave him and the Augmentin  just as a precaution.  Still awaiting the Lyme disease titers however I feel that this is going to be normal.  Other workup to date has been negative.  I will recheck a CBC and a CMP to follow-up his lab abnormalities but based on how good he looks today, I feel that this will be normal.  Recommended that he resume losartan   hydrochlorothiazide  due to his elevated blood pressure

## 2024-08-27 LAB — CBC WITH DIFFERENTIAL/PLATELET
Absolute Lymphocytes: 1513 {cells}/uL (ref 850–3900)
Absolute Monocytes: 620 {cells}/uL (ref 200–950)
Basophils Absolute: 99 {cells}/uL (ref 0–200)
Basophils Relative: 1.6 %
Eosinophils Absolute: 260 {cells}/uL (ref 15–500)
Eosinophils Relative: 4.2 %
HCT: 40.1 % (ref 38.5–50.0)
Hemoglobin: 13.1 g/dL — ABNORMAL LOW (ref 13.2–17.1)
MCH: 28.1 pg (ref 27.0–33.0)
MCHC: 32.7 g/dL (ref 32.0–36.0)
MCV: 85.9 fL (ref 80.0–100.0)
MPV: 9.1 fL (ref 7.5–12.5)
Monocytes Relative: 10 %
Neutro Abs: 3708 {cells}/uL (ref 1500–7800)
Neutrophils Relative %: 59.8 %
Platelets: 260 Thousand/uL (ref 140–400)
RBC: 4.67 Million/uL (ref 4.20–5.80)
RDW: 14 % (ref 11.0–15.0)
Total Lymphocyte: 24.4 %
WBC: 6.2 Thousand/uL (ref 3.8–10.8)

## 2024-08-27 LAB — COMPREHENSIVE METABOLIC PANEL WITH GFR
AG Ratio: 1.5 (calc) (ref 1.0–2.5)
ALT: 27 U/L (ref 9–46)
AST: 19 U/L (ref 10–35)
Albumin: 4.1 g/dL (ref 3.6–5.1)
Alkaline phosphatase (APISO): 74 U/L (ref 35–144)
BUN/Creatinine Ratio: 11 (calc) (ref 6–22)
BUN: 16 mg/dL (ref 7–25)
CO2: 24 mmol/L (ref 20–32)
Calcium: 9.5 mg/dL (ref 8.6–10.3)
Chloride: 104 mmol/L (ref 98–110)
Creat: 1.5 mg/dL — ABNORMAL HIGH (ref 0.70–1.28)
Globulin: 2.8 g/dL (ref 1.9–3.7)
Glucose, Bld: 101 mg/dL — ABNORMAL HIGH (ref 65–99)
Potassium: 5.2 mmol/L (ref 3.5–5.3)
Sodium: 137 mmol/L (ref 135–146)
Total Bilirubin: 0.5 mg/dL (ref 0.2–1.2)
Total Protein: 6.9 g/dL (ref 6.1–8.1)
eGFR: 50 mL/min/1.73m2 — ABNORMAL LOW (ref >=60)

## 2024-08-29 ENCOUNTER — Ambulatory Visit: Payer: Self-pay | Admitting: Family Medicine

## 2024-12-23 ENCOUNTER — Ambulatory Visit: Admitting: Cardiovascular Disease
